# Patient Record
Sex: Female | Born: 1960
Health system: Southern US, Community
[De-identification: ages and names within clinical notes are randomized; demographics above are authoritative.]

## PROBLEM LIST (undated history)

## (undated) ENCOUNTER — Emergency Department (HOSPITAL_COMMUNITY): Disposition: A | Discharge status: Home / Self Care | Payer: Self-pay

## (undated) DIAGNOSIS — L853 Xerosis cutis: Secondary | ICD-10-CM

## (undated) DIAGNOSIS — J438 Other emphysema: Secondary | ICD-10-CM

## (undated) DIAGNOSIS — L02419 Cutaneous abscess of limb, unspecified: Secondary | ICD-10-CM

## (undated) DIAGNOSIS — F411 Generalized anxiety disorder: Secondary | ICD-10-CM

## (undated) DIAGNOSIS — R002 Palpitations: Secondary | ICD-10-CM

## (undated) DIAGNOSIS — R87612 Low grade squamous intraepithelial lesion on cytologic smear of cervix (LGSIL): Secondary | ICD-10-CM

## (undated) DIAGNOSIS — R05 Cough: Secondary | ICD-10-CM

## (undated) DIAGNOSIS — I10 Essential (primary) hypertension: Secondary | ICD-10-CM

## (undated) DIAGNOSIS — F17200 Nicotine dependence, unspecified, uncomplicated: Secondary | ICD-10-CM

## (undated) DIAGNOSIS — E7800 Pure hypercholesterolemia, unspecified: Secondary | ICD-10-CM

## (undated) DIAGNOSIS — L02415 Cutaneous abscess of right lower limb: Secondary | ICD-10-CM

## (undated) DIAGNOSIS — R21 Rash and other nonspecific skin eruption: Secondary | ICD-10-CM

## (undated) DIAGNOSIS — C801 Malignant (primary) neoplasm, unspecified: Secondary | ICD-10-CM

## (undated) HISTORY — PX: SKIN FULL THICKNESS GRAFT: SHX442

## (undated) HISTORY — DX: Pure hypercholesterolemia, unspecified: E78.00

## (undated) HISTORY — DX: Nicotine dependence, unspecified, uncomplicated: F17.200

## (undated) HISTORY — DX: Generalized anxiety disorder: F41.1

## (undated) HISTORY — DX: Low grade squamous intraepithelial lesion on cytologic smear of cervix (LGSIL): R87.612

## (undated) HISTORY — PX: NASAL FLAP ROTATION: SHX5366

## (undated) HISTORY — DX: Cough: R05

## (undated) HISTORY — PX: ABDOMINAL HYSTERECTOMY: SHX81

## (undated) HISTORY — DX: Xerosis cutis: L85.3

## (undated) HISTORY — DX: Other emphysema: J43.8

## (undated) HISTORY — DX: Rash and other nonspecific skin eruption: R21

## (undated) HISTORY — PX: TONSILLECTOMY: SUR1361

## (undated) HISTORY — PX: RHINOPLASTY: SHX2354

## (undated) HISTORY — DX: Essential (primary) hypertension: I10

## (undated) HISTORY — PX: NASAL RECONSTRUCTION: SHX2069

## (undated) HISTORY — DX: Cutaneous abscess of right lower limb: L02.415

## (undated) HISTORY — DX: Cutaneous abscess of limb, unspecified: L02.419

---

## 1999-01-04 ENCOUNTER — Encounter: Payer: Self-pay | Admitting: Family Medicine

## 1999-01-04 ENCOUNTER — Ambulatory Visit (HOSPITAL_COMMUNITY): Admission: RE | Admit: 1999-01-04 | Discharge: 1999-01-04 | Payer: Self-pay | Admitting: Family Medicine

## 2000-11-01 ENCOUNTER — Other Ambulatory Visit: Admission: RE | Admit: 2000-11-01 | Discharge: 2000-11-01 | Payer: Self-pay | Admitting: Obstetrics and Gynecology

## 2000-11-05 ENCOUNTER — Encounter: Payer: Self-pay | Admitting: Obstetrics and Gynecology

## 2000-11-05 ENCOUNTER — Ambulatory Visit (HOSPITAL_COMMUNITY): Admission: RE | Admit: 2000-11-05 | Discharge: 2000-11-05 | Payer: Self-pay | Admitting: Obstetrics and Gynecology

## 2000-11-28 ENCOUNTER — Inpatient Hospital Stay (HOSPITAL_COMMUNITY): Admission: RE | Admit: 2000-11-28 | Discharge: 2000-12-01 | Payer: Self-pay | Admitting: Obstetrics and Gynecology

## 2000-11-28 ENCOUNTER — Encounter (INDEPENDENT_AMBULATORY_CARE_PROVIDER_SITE_OTHER): Payer: Self-pay | Admitting: Specialist

## 2002-02-05 HISTORY — PX: ABDOMINAL HYSTERECTOMY: SHX81

## 2008-01-24 ENCOUNTER — Emergency Department (HOSPITAL_COMMUNITY): Admission: EM | Admit: 2008-01-24 | Discharge: 2008-01-24 | Payer: Self-pay | Admitting: Emergency Medicine

## 2008-03-04 ENCOUNTER — Encounter: Admission: RE | Admit: 2008-03-04 | Discharge: 2008-03-04 | Payer: Self-pay | Admitting: Internal Medicine

## 2008-03-30 ENCOUNTER — Encounter: Admission: RE | Admit: 2008-03-30 | Discharge: 2008-03-30 | Payer: Self-pay | Admitting: Internal Medicine

## 2008-03-30 ENCOUNTER — Other Ambulatory Visit: Admission: RE | Admit: 2008-03-30 | Discharge: 2008-03-30 | Payer: Self-pay | Admitting: Interventional Radiology

## 2008-03-30 ENCOUNTER — Encounter (INDEPENDENT_AMBULATORY_CARE_PROVIDER_SITE_OTHER): Payer: Self-pay | Admitting: Interventional Radiology

## 2010-06-23 NOTE — H&P (Signed)
[facility]  Patient:    Melanie Harris, Melanie Harris Visit Number: [id number] MRN: [id number]          Service Type: GYN Location: 1S 0006 01 Attending Physician:  Malon Kindle Dictated by:   Malachi Pro. Ambrose Mantle, M.D. Admit Date:  11/28/2000                           History and Physical  HISTORY OF PRESENT ILLNESS:  This is a 50 year old white female, para 1-0-0-1, who was admitted to the hospital for abdominal hysterectomy and possible bilateral salpingo-oophorectomy because of severe menorrhagia, severe dysmenorrhea, and significant anemia that has been partially corrected by iron.  Last menstrual period November 27, 2000.  The patients periods occur apparently at two to four-week intervals.  The previous period was November 14, 2000, and the period before that was September 23, 2000.  The periods last for seven days, are extremely heavy for three to four days, and accompanied by severe dysmenorrhea.  When I saw her on November 01, 2000, the patient stated that for the past year, periods have been heavy heavy.  She would change a tampon and pad every 30 minutes and would bleed heavily for three to four days.  It was hard to get out of bed on her heavy beds and hard to concentrate at work.  At that time, her hemoglobin was 8.1 and hematocrit 25.9.  She was placed on iron and an ultrasound was done which showed what was considered to be a normal sized uterus, but with submucosal fibroids.  The patient was offered D&C and hysteroscopy or hysterectomy.  She chose to proceed with hysterectomy.  The patient states that she would use 18 tampons per day and six pads per day.  The pain was so severe that it would make her lie down. She reports a low sex drive, wanting to have sex probably no more than once every three months.  ALLERGIES:  No known allergies.  MEDICATIONS:  She is on iron and Ziac.  PAST SURGICAL HISTORY:  Tonsillectomy.  PAST MEDICAL HISTORY:   High blood pressure.  No other major illnesses.  SOCIAL HISTORY:  She does not drink or smoke.  REVIEW OF SYSTEMS:  Noncontributory, except as in the present illness.  FAMILY HISTORY:  Mother 68 with high blood pressure.  Father 35, living and well.  One sister is normal with no health problems.  No brothers.  PHYSICAL EXAMINATION:  The head, eyes, ears, nose, and throat are normal.  VITAL SIGNS:  Blood pressure 144/70, pulse 80.  WEIGHT:  111-1/2 pounds.  BREASTS:  Soft without masses.  LUNGS:  Clear to P&A.  HEART:  Normal size and sounds.  No murmurs.  NECK:  Supple without thyromegaly.  ABDOMEN:  Soft and nontender.  No masses palpable.  Liver, spleen, and kidneys not felt.  PELVIC:  The Pap smear on September 27,2 002, was within normal limits.  The vulva and vagina are clean.  The cervix is clean.  There is menstrual blood in the vagina.  The uterus is posterior and thought to be two to two-and-a-half times normal size.  The adnexa are free of masses.  ADMITTING IMPRESSION: 1. Leiomyomata uteri. 2. Severe menorrhagia and dysmenorrhea. 2. Severe anemia.  PLAN:  The patient was admitted for abdominal hysterectomy and possible bilateral salpingo-oophorectomy.  She has been counseled about the risks of surgery, including, but not limited to heart attack, stroke, pulmonary  embolus, wound disruption, hemorrhage with need for reoperation and/or transfusion, fistula formation, nerve injury, and intestinal obstruction.  She understands and agrees to proceed. Dictated by:   Malachi Pro. Ambrose Mantle, M.D. Attending Physician:  Malon Kindle DD:  11/28/00 TD:  11/29/00 Job: [id number]

## 2010-06-23 NOTE — Op Note (Signed)
[facility]  Patient:    Melanie Harris, Melanie Harris Visit Number: [id number] MRN: [id number]          Service Type: GYN Location: 4W 0454 01 Attending Physician:  Malon Kindle Proc. Date: 11/28/00 Admit Date:  11/28/2000                             Operative Report  PREOPERATIVE DIAGNOSES:  Severe menorrhagia, severe dysmenorrhea, history of anemia to 7 grams, and ultrasound showing submucous fibroids.  POSTOPERATIVE DIAGNOSES:  Severe menorrhagia, severe dysmenorrhea, history of anemia to 7 grams, and ultrasound showing submucous fibroids.  OPERATION:  Abdominal hysterectomy.  OPERATOR:  Malachi Pro. Ambrose Mantle, M.D.  ASSISTANT:  Zenaida Niece, M.D.  ANESTHESIA:  General.  DESCRIPTION OF PROCEDURE:  The patient was brought to the operating room and placed under satisfactory general anesthesia.  She was placed in frogleg position.  The abdomen, vulva, vagina, and urethra were prepped with Betadine solution and draped as a sterile field after a Foley catheter was inserted to straight drain.  Protective towels were used to keep the Betadine from running under her body.  The abdomen was then draped as a sterile field, and a transverse incision was made and carried in layers through the skin, subcutaneous tissue, and fascia.  The fascia was separated from the rectus muscles superiorly and inferiorly.  The rectus muscles split in the midline, and the peritoneum was opened vertically.  Exploration of the upper abdomen revealed the liver to be smooth.  The gallbladder was small with no stones. Both kidneys felt normal.  Exploration of the pelvis revealed the uterus to be upper limit of normal size.  I had thought that it was at least two times normal size on exam, but the uterus was actually only upper limit of normal size.  It did not have any palpable fibroids.  Both tubes and ovaries appeared normal.  The cul-de-sacs were normal.  Packs and  retractors were used for exposure.  The uterus was elevated into the operative field.  The round ligaments bilaterally were cauterized and divided, and a bladder flap was developed.  The uteroovarian ligament and mesosalpinx were clamped and cut, and a double ligature was placed across each upper pedicle.  The uterine vessels were skeletonized, clamped, cut, and suture ligated.  Additional clamps were taken along the parametrial and paracervical tissues.  Therapeutic uterosacral ligaments were clamped, cut, suture ligated, and held, and the right vaginal angle was entered.  The uterus was then removed by transecting the upper vagina.  Vaginal angle sutures were placed, and then the central portion of the vagina was closed with interrupted figure-of-eight sutures of 0 Vicryl.  Hemostasis was achieved with a couple of additional sutures.  The uterosacral ligaments were sutured together in the midline with two sutures of 0 Vicryl.  The bladder was inflated with a significant amount of methylene blue-stained fluid, and there were no sutures close to the bladder, and there was no leakage.  Reperitonealization was done across the vaginal cuff with a running suture of 0 Vicryl.  The left round ligament stump was tied since it seemed to have a little bit of bleeding.  The right round ligament was not bleeding and was left alone.  Both tubes and ovaries appeared normal.  The appendix was identified and was completely retrocecal but was normal.  The abdominal wall was then closed after liberal irrigation confirmed hemostasis. All packs and  retractors were removed.  The abdominal wall was closed with a running suture of 0 Vicryl on the peritoneum, interrupted 0 Vicryl on the rectus fascia, two running sutures of 0 Vicryl on the fascia, a running 3-0 Vicryl on the subcu tissue, and staples on the skin.  The patient seemed to tolerate the procedure well.  Blood loss was about 100 cc.  Sponge and  needle counts were correct, and the patient was returned to recovery in satisfactory condition. Attending Physician:  Malon Kindle DD:  11/28/00 TD:  11/29/00 Job: [id number]

## 2010-06-23 NOTE — Discharge Summary (Signed)
[facility]  Patient:    Melanie Harris, Melanie Harris Visit Number: [id number] MRN: [id number]          Service Type: GYN Location: [phone] Attending Physician:  Malon Kindle Admit Date:  11/28/2000 Discharge Date: 12/01/2000                             Discharge Summary  This is the second dictation on this patient.  HISTORY:  This is a 50 year old white female with severe menorrhagia, severe dysmenorrhea, history of anemia to 7 grams, and ultrasound showing a submucous fibroid admitted for hysterectomy.  The patient underwent hysterectomy by Dr. Ambrose Mantle with Dr. Jackelyn Knife assisting under general anesthesia with blood loss of about 100 cc.  HOSPITAL COURSE:  Postoperatively, the patient did well.  She did have complaints of nausea and dizziness on the first postop day.  Temperature rose to a maximum of 100.5 on the first postop day.  She was advised to increase pulmonary toilet by coughing, deep breathing and on the third postop day, her temperature maximum had been 100.1.  She had passed flatus, had a bowel movement.  Her abdomen was soft and nontender.  Staples were removed, strips applied, and she was considered ready for discharge.  Labs showed initial white count of 5700.  Hemoglobin 10.9, hematocrit 33.4, platelet count 314,000.  Follow-up hematocrit was 30.1 and 31.0, 62 segs, 27 lymphs, 9 monos, 2 eosinophils.  Comprehensive metabolic profile was within normal limits.  Urinalysis was negative.  Path report showed uterus with chronic cervicitis and squamous metaplasia, secretory phase endometrium with degenerative changes consistent with breakthrough bleeding.  No malignancy identified.  Benign adenomyoma.  On the cut surface of the uterus, there was a 2 cm polypoid submucosal leiomyoma that on sectioning was an adenomyoma which is a leiomyoma with endometrial glands.  FINAL DIAGNOSES: 1. Severe menorrhagia and dysmenorrhea. 2. Severe  anemia. 3. Adenomyoma.  OPERATION:  Abdominal hysterectomy.  FINAL CONDITION:  Improved.  INSTRUCTIONS:  Regular diet, no vaginal entrance, no heavy lifting or strenuous activity.  Call with any temperature elevation greater than 100.4 degrees.  Call with any unusual problems.  Return to the office in 10-14 days for follow-up examination. Attending Physician:  Malon Kindle DD:  12/18/00 TD:  12/18/00 Job: [phone] [id number]

## 2010-10-24 ENCOUNTER — Emergency Department (HOSPITAL_COMMUNITY)
Admission: EM | Admit: 2010-10-24 | Discharge: 2010-10-24 | Disposition: A | Discharge status: Home / Self Care | Payer: Self-pay | Attending: Emergency Medicine | Admitting: Emergency Medicine

## 2010-10-24 DIAGNOSIS — L02419 Cutaneous abscess of limb, unspecified: Secondary | ICD-10-CM | POA: Insufficient documentation

## 2010-10-26 ENCOUNTER — Emergency Department (HOSPITAL_COMMUNITY)
Admission: EM | Admit: 2010-10-26 | Discharge: 2010-10-26 | Disposition: A | Discharge status: Home / Self Care | Payer: Self-pay | Attending: Emergency Medicine | Admitting: Emergency Medicine

## 2010-10-26 DIAGNOSIS — L03119 Cellulitis of unspecified part of limb: Secondary | ICD-10-CM | POA: Insufficient documentation

## 2010-10-26 DIAGNOSIS — L02419 Cutaneous abscess of limb, unspecified: Secondary | ICD-10-CM | POA: Insufficient documentation

## 2010-10-26 DIAGNOSIS — Z09 Encounter for follow-up examination after completed treatment for conditions other than malignant neoplasm: Secondary | ICD-10-CM | POA: Insufficient documentation

## 2011-03-11 ENCOUNTER — Encounter (HOSPITAL_COMMUNITY): Payer: Self-pay | Admitting: *Deleted

## 2011-03-11 ENCOUNTER — Emergency Department (HOSPITAL_COMMUNITY)
Admission: EM | Admit: 2011-03-11 | Discharge: 2011-03-11 | Disposition: A | Discharge status: Home / Self Care | Payer: Self-pay | Attending: Emergency Medicine | Admitting: Emergency Medicine

## 2011-03-11 DIAGNOSIS — IMO0002 Reserved for concepts with insufficient information to code with codable children: Secondary | ICD-10-CM

## 2011-03-11 DIAGNOSIS — F17200 Nicotine dependence, unspecified, uncomplicated: Secondary | ICD-10-CM | POA: Insufficient documentation

## 2011-03-11 DIAGNOSIS — L02419 Cutaneous abscess of limb, unspecified: Secondary | ICD-10-CM | POA: Insufficient documentation

## 2011-03-11 MED ORDER — DOXYCYCLINE HYCLATE 100 MG PO CAPS: 100.0000 mg | ORAL_CAPSULE | Freq: Two times a day (BID) | ORAL | Status: AC

## 2011-03-11 MED ORDER — HYDROCODONE-ACETAMINOPHEN 5-325 MG PO TABS: 1.0000 | ORAL_TABLET | ORAL | Status: AC | PRN

## 2011-03-11 NOTE — ED Provider Notes (Signed)
History     CSN: [id number]  Arrival date & time 03/11/11  1041   First MD Initiated Contact with Patient 03/11/11 1113      Chief Complaint  Patient presents with  . Abscess    (Consider location/radiation/quality/duration/timing/severity/associated sxs/prior treatment) Patient is a 51 y.o. female presenting with abscess. The history is provided by the patient.  Abscess  This is a recurrent problem. The current episode started less than one week ago. The onset was gradual. The problem has been gradually worsening. Affected Location: right posterior thigh. The problem is severe. The abscess is characterized by painfulness, draining and redness. The abscess first occurred at home. Pertinent negatives include no fever. She has received no recent medical care.    History reviewed. No pertinent past medical history.  Past Surgical History  Procedure Date  . Abdominal hysterectomy   . Tonsillectomy     No family history on file.  History  Substance Use Topics  . Smoking status: Current Everyday Smoker -- 0.5 packs/day  . Smokeless tobacco: Not on file  . Alcohol Use: 0.6 oz/week    1 Glasses of wine per week     Review of Systems  Constitutional: Negative for fever.  Skin: Positive for wound.  Neurological: Negative for numbness.  All other systems reviewed and are negative.     Allergies  Review of patient's allergies indicates no known allergies.  Home Medications  No current outpatient prescriptions on file.  BP 143/85  Pulse 114  Temp(Src) 98.6 F (37 C) (Oral)  Resp 16  Wt 105 lb (47.628 kg)  SpO2 99%  Physical Exam  Nursing note and vitals reviewed. Constitutional: She is oriented to person, place, and time. She appears well-developed and well-nourished. No distress.  HENT:  Head: Normocephalic and atraumatic.  Right Ear: External ear normal.  Left Ear: External ear normal.  Eyes: Pupils are equal, round, and reactive to light.  Neck: Normal range  of motion. Neck supple.  Cardiovascular: Normal rate, regular rhythm and intact distal pulses.   Pulmonary/Chest: Effort normal. No respiratory distress.  Abdominal: Soft. She exhibits no distension. There is no tenderness.  Musculoskeletal: Normal range of motion. She exhibits no edema and no tenderness.  Neurological: She is alert and oriented to person, place, and time. No cranial nerve deficit.  Skin: Skin is warm and dry.     Psychiatric: She has a normal mood and affect.    ED Course  Procedures (including critical care time)  Labs Reviewed - No data to display No results found.  INCISION AND DRAINAGE Performed by: Lorenz Coaster Consent: Verbal consent obtained. Risks and benefits: risks, benefits and alternatives were discussed Type: abscess  Body area: right posterior thigh  Anesthesia: local infiltration  Local anesthetic: lidocaine 1% with epinephrine  Anesthetic total: 1.5 ml  Complexity: complex Blunt dissection to break up loculations  Drainage: purulent  Drainage amount: large amt purulent drainage from distal area of induration, no drainage from proximal area  Packing material: 1/4 in iodoform gauze  Patient tolerance: Patient tolerated the procedure well with no immediate complications.   Dx 1: Abscess right thigh    MDM  Abscess drained. Small amt surrounding cellulitis.  Will d.c home with abx, pain medication. 48 f/u.        Shaaron Adler, PA-C 03/11/11 1329

## 2011-03-11 NOTE — ED Notes (Signed)
Thursday noted lesion posterior right thigh. Opened with small amount of purulent matter drained. I note two apparent lesions right posterior leg, One dark in color the other reddish. Very painful to palpation.

## 2011-03-12 NOTE — ED Provider Notes (Signed)
Medical screening examination/treatment/procedure(s) were performed by non-physician practitioner and as supervising physician I was immediately available for consultation/collaboration.  Gerhard Munch, MD 03/12/11 [phone]

## 2011-07-22 ENCOUNTER — Encounter (HOSPITAL_COMMUNITY): Payer: Self-pay

## 2011-07-22 ENCOUNTER — Emergency Department (INDEPENDENT_AMBULATORY_CARE_PROVIDER_SITE_OTHER)
Admission: EM | Admit: 2011-07-22 | Discharge: 2011-07-22 | Disposition: A | Discharge status: Home / Self Care | Payer: BC Managed Care – PPO | Source: Home / Self Care | Attending: Family Medicine | Admitting: Family Medicine

## 2011-07-22 DIAGNOSIS — L988 Other specified disorders of the skin and subcutaneous tissue: Secondary | ICD-10-CM

## 2011-07-22 DIAGNOSIS — D1720 Benign lipomatous neoplasm of skin and subcutaneous tissue of unspecified limb: Secondary | ICD-10-CM

## 2011-07-22 DIAGNOSIS — L089 Local infection of the skin and subcutaneous tissue, unspecified: Secondary | ICD-10-CM

## 2011-07-22 DIAGNOSIS — D1739 Benign lipomatous neoplasm of skin and subcutaneous tissue of other sites: Secondary | ICD-10-CM

## 2011-07-22 MED ORDER — HYDROCODONE-ACETAMINOPHEN 5-500 MG PO TABS: 1.0000 | ORAL_TABLET | Freq: Four times a day (QID) | ORAL | Status: AC | PRN

## 2011-07-22 MED ORDER — DOXYCYCLINE HYCLATE 100 MG PO CAPS: 100.0000 mg | ORAL_CAPSULE | Freq: Two times a day (BID) | ORAL | Status: AC

## 2011-07-22 NOTE — Discharge Instructions (Signed)
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 Until the fistula is surgically removed he can present with recurrent infection and drainage. Call [facility] surgery number above to make an appointment. In the meantime: Keep area clean and dry. Can use soap and water for cleaning but dry well before covering. Take the prescribed medications as instructed. Be aware that Vicodin can make you drowsy and he should not take before driving. Return to medical attention if worsening redness, swelling, tenderness or drainage despite following treatment.

## 2011-07-22 NOTE — ED Notes (Signed)
Pt has abscess on back of rt leg, had another lesion in same area that was drained and packed at [facility].

## 2011-07-23 NOTE — ED Provider Notes (Signed)
History     CSN: [id number]  Arrival date & time 07/22/11  1547   First MD Initiated Contact with Patient 07/22/11 1553      Chief Complaint  Patient presents with  . Abscess    (Consider location/radiation/quality/duration/timing/severity/associated sxs/prior treatment) HPI Comments: 51 y/o smoker, non diabetic female here c/o drainage swelling and tenderness in the back of her right thigh. States she has had a skin not for years that did not bothered her. Same area got infected 4 months ago and developed an abscess that was I&Dd at [facility]. After I&D patient was treated with doxycycline for 10 days, states that her pain and tenderness got better but she has had persistent intermitent drainage for months. Area becoming red and tender again. Denies fever or chills. No taking any medications for pain.   History reviewed. No pertinent past medical history.  Past Surgical History  Procedure Date  . Abdominal hysterectomy   . Tonsillectomy     History reviewed. No pertinent family history.  History  Substance Use Topics  . Smoking status: Current Everyday Smoker -- 0.5 packs/day  . Smokeless tobacco: Not on file  . Alcohol Use: 0.6 oz/week    1 Glasses of wine per week    OB History    Grav Para Term Preterm Abortions TAB SAB Ect Mult Living                  Review of Systems  Constitutional: Negative for fever and chills.  Skin:       As per HPI.  All other systems reviewed and are negative.    Allergies  Review of patient's allergies indicates no known allergies.  Home Medications   Current Outpatient Rx  Name Route Sig Dispense Refill  . DIPHENHYDRAMINE-APAP (SLEEP) 25-500 MG PO TABS Oral Take 1 tablet by mouth at bedtime as needed.    Marland Kitchen DOXYCYCLINE HYCLATE 100 MG PO CAPS Oral Take 1 capsule (100 mg total) by mouth 2 (two) times daily. 20 capsule 0  . HYDROCODONE-ACETAMINOPHEN 5-500 MG PO TABS Oral Take 1 tablet by mouth every 6 (six) hours as  needed for pain. 15 tablet 0  . IBUPROFEN 200 MG PO TABS Oral Take 600 mg by mouth every 6 (six) hours as needed.      BP 175/88  Pulse 86  Temp 98.2 F (36.8 C) (Oral)  Resp 18  SpO2 99%  Physical Exam  Nursing note and vitals reviewed. Constitutional: She is oriented to person, place, and time. She appears well-developed and well-nourished. No distress.  HENT:  Head: Normocephalic and atraumatic.  Cardiovascular: Normal heart sounds.   Pulmonary/Chest: Breath sounds normal.  Neurological: She is alert and oriented to person, place, and time.  Skin:       Posterior left thigh: located at center mid posterior thigh there is an area of non tender soft mass with characteristic consistent with skin lipoma. Just inferior and adjacent to this mass there is an area were skin appears violaceous in color with a central opening of about 5 mm and scant discharge. There is mild surrounding erythema with no induration and patient reports tenderness to palpation. No fluctuations. No striking erythema.    ED Course  Debridement Performed by: Sharin Grave Authorized by: Sharin Grave Consent: Verbal consent obtained. Risks and benefits: risks, benefits and alternatives were discussed Consent given by: patient Patient understanding: patient states understanding of the procedure being performed Preparation: Patient was prepped and draped in the  usual sterile fashion. Local anesthesia used: yes Local anesthetic: lidocaine 1% with epinephrine Anesthetic total: 2 ml Patient tolerance: Patient tolerated the procedure well with no immediate complications. Comments: After local anesthesia area was cleaned and irrigated. Some devitalized skin removed. Was able to intrduce the tip of forcept all the way in and impress underlying fistula of more than 3 cm with gray walls. No significant drainage. Stimulated walls of fistula until red blood return. Minimal bleeding. Area was covered with  antibiotic ointment and dry dressing.    (including critical care time)  Labs Reviewed - No data to display No results found.   1. Skin fistula   2. Infection, skin   3. Lipoma of thigh       MDM  My impression is that Mrs. Coghlan has had a skin lipoma for years. 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 I think this patient will benefit for surgical removal of affected skin, including lipoma and fistula for definitive treatement once infection is cleared. Prescribed doxycycline, wound care instructions discussed. Referral for [facility] surgery provided. Asked to have her wound rechecked earlier if worsening pain redness or drainage.         Sharin Grave, MD 07/23/11 1216

## 2011-07-25 ENCOUNTER — Ambulatory Visit (INDEPENDENT_AMBULATORY_CARE_PROVIDER_SITE_OTHER): Payer: BC Managed Care – PPO | Admitting: General Surgery

## 2011-07-25 ENCOUNTER — Encounter (INDEPENDENT_AMBULATORY_CARE_PROVIDER_SITE_OTHER): Payer: Self-pay | Admitting: General Surgery

## 2011-07-25 VITALS — BP 138/68 | HR 80 | Temp 98.0°F | Resp 14 | Ht 67.0 in | Wt 102.0 lb

## 2011-07-25 DIAGNOSIS — L02415 Cutaneous abscess of right lower limb: Secondary | ICD-10-CM | POA: Insufficient documentation

## 2011-07-25 DIAGNOSIS — L03119 Cellulitis of unspecified part of limb: Secondary | ICD-10-CM

## 2011-07-25 HISTORY — DX: Cutaneous abscess of right lower limb: L02.415

## 2011-07-25 NOTE — Progress Notes (Signed)
Subjective:     Patient ID: Melanie Harris, female   DOB: 1960/03/30, 51 y.o.   MRN: [id number]  HPI We're asked to see the patient in consultation by Dr. Hattie Perch to evaluate her for a cyst on her right posterior thigh. The patient is a 51 year old white female who states that she has had a cyst on her legs since October. She states it has been lanced a couple of times by urgent care doctors. This was last done about 3 days ago. She still has some pain in the area. She denies any drainage.  Review of Systems  Constitutional: Negative.   HENT: Negative.   Eyes: Negative.   Respiratory: Negative.   Cardiovascular: Negative.   Gastrointestinal: Negative.   Genitourinary: Negative.   Musculoskeletal: Negative.   Skin: Negative.   Neurological: Negative.   Hematological: Negative.   Psychiatric/Behavioral: Negative.        Objective:   Physical Exam  Constitutional: She is oriented to person, place, and time. She appears well-developed and well-nourished.  HENT:  Head: Normocephalic and atraumatic.  Eyes: Conjunctivae and EOM are normal. Pupils are equal, round, and reactive to light.  Neck: Normal range of motion. Neck supple.  Cardiovascular: Normal rate, regular rhythm and normal heart sounds.   Pulmonary/Chest: Effort normal and breath sounds normal.  Abdominal: Soft. Bowel sounds are normal.  Musculoskeletal: Normal range of motion.       The patient has a small open wound on the right posterior thigh. I cannot express any purulence from the area today. There is minimal redness or induration.  Neurological: She is alert and oriented to person, place, and time.  Skin: Skin is warm and dry.  Psychiatric: She has a normal mood and affect. Her behavior is normal.       Assessment:     The patient has what appears to be a sebaceous cyst on the right posterior thigh. This area has recurrently got infected. Today the area looks fairly clean. It would be my recommendation to  try to prevent more recurrent infections of the area to try to let it heal for several weeks and then we can plan to take her to the operating room and excise this area completely back to healthy tissue and hopefully that will help it heal properly. She is in agreement with this treatment plan to    Plan:     We will plan to see her back in about 3 weeks for a wound check

## 2011-07-25 NOTE — Patient Instructions (Signed)
Shower daily and keep area covered with clean dry gauze

## 2011-08-21 ENCOUNTER — Encounter (HOSPITAL_COMMUNITY): Payer: Self-pay

## 2011-08-21 ENCOUNTER — Emergency Department (INDEPENDENT_AMBULATORY_CARE_PROVIDER_SITE_OTHER)
Admission: EM | Admit: 2011-08-21 | Discharge: 2011-08-21 | Disposition: A | Discharge status: Home / Self Care | Payer: BC Managed Care – PPO | Source: Home / Self Care | Attending: Emergency Medicine | Admitting: Emergency Medicine

## 2011-08-21 ENCOUNTER — Ambulatory Visit (INDEPENDENT_AMBULATORY_CARE_PROVIDER_SITE_OTHER): Payer: BC Managed Care – PPO | Admitting: General Surgery

## 2011-08-21 ENCOUNTER — Encounter (INDEPENDENT_AMBULATORY_CARE_PROVIDER_SITE_OTHER): Payer: Self-pay | Admitting: General Surgery

## 2011-08-21 VITALS — BP 130/72 | HR 68 | Temp 96.9°F | Resp 14 | Ht 67.0 in | Wt 101.0 lb

## 2011-08-21 DIAGNOSIS — R21 Rash and other nonspecific skin eruption: Secondary | ICD-10-CM

## 2011-08-21 DIAGNOSIS — L02419 Cutaneous abscess of limb, unspecified: Secondary | ICD-10-CM | POA: Insufficient documentation

## 2011-08-21 DIAGNOSIS — B372 Candidiasis of skin and nail: Secondary | ICD-10-CM

## 2011-08-21 HISTORY — DX: Rash and other nonspecific skin eruption: R21

## 2011-08-21 MED ORDER — CLOTRIMAZOLE 1 % EX CREA: TOPICAL_CREAM | CUTANEOUS | Status: DC

## 2011-08-21 NOTE — Patient Instructions (Signed)
Need to see dermatologist. Once skin is healed then we will plan to excise the area on thigh

## 2011-08-21 NOTE — ED Provider Notes (Signed)
History     CSN: [id number]  Arrival date & time 08/21/11  1127   First MD Initiated Contact with Patient 08/21/11 1129      Chief Complaint  Patient presents with  . Rash    (Consider location/radiation/quality/duration/timing/severity/associated sxs/prior treatment) Patient is a 51 y.o. female presenting with rash. The history is provided by the patient.  Rash   This patient complains of a pruritic painful, burning rash.  Location: bilateral axilla, right antecubital space, right posterior thigh  Onset: 2 wk ago   Course: worsen Self-treated with: peroxide, cortaid             Improvement with treatment: minimal  History Itching: yes  Tenderness: no  New medications/antibiotics: no  Pet exposure: no  Recent travel or tropical exposure: no  New soaps, shampoos, detergent, clothing: no Tick/insect exposure: no   Red Flags Feeling ill: no Fever:no Facial/tongue swelling/difficulty breathing:  no  Diabetic or immunocompromised: no  Patient with history of complex skin infection, treated with antibiotics, to right posterior thigh that has since improved but subsequently developed red itchy rash to area, states bilateral axilla began to exhibit same and has now gotten progressively worse, has refrained from topical agents(deodorant), has appointment with dermatology in two weeks.  Past Medical History  Diagnosis Date  . Abscess of thigh     right    Past Surgical History  Procedure Date  . Abdominal hysterectomy 2004  . Tonsillectomy 10-12 yrs old    Family History  Problem Relation Age of Onset  . Lung cancer Father   . Heart failure Mother     History  Substance Use Topics  . Smoking status: Current Everyday Smoker -- 0.5 packs/day  . Smokeless tobacco: Not on file  . Alcohol Use: 0.6 oz/week    1 Glasses of wine per week    OB History    Grav Para Term Preterm Abortions TAB SAB Ect Mult Living                  Review of Systems  Skin:  Positive for rash.  All other systems reviewed and are negative.    Allergies  Review of patient's allergies indicates no known allergies.  Home Medications   Current Outpatient Rx  Name Route Sig Dispense Refill  . CLOTRIMAZOLE 1 % EX CREA  Apply to affected area 2 times daily 45 g 1  . IBUPROFEN 200 MG PO TABS Oral Take 600 mg by mouth every 6 (six) hours as needed.      BP 160/90  Pulse 97  Temp 98.2 F (36.8 C) (Oral)  Resp 16  SpO2 97%  Physical Exam  Nursing note and vitals reviewed. Constitutional: She is oriented to person, place, and time. Vital signs are normal. She appears well-developed and well-nourished. She is active and cooperative.  HENT:  Head: Normocephalic.  Mouth/Throat: Uvula is midline, oropharynx is clear and moist and mucous membranes are normal.  Eyes: Conjunctivae are normal. Pupils are equal, round, and reactive to light. No scleral icterus.  Neck: Trachea normal. Neck supple.  Cardiovascular: Normal rate and regular rhythm.   Pulmonary/Chest: Effort normal and breath sounds normal.  Lymphadenopathy:    She has no cervical adenopathy.    She has no axillary adenopathy.  Neurological: She is alert and oriented to person, place, and time. No cranial nerve deficit or sensory deficit.  Skin: Skin is warm and dry. Rash noted. There is erythema.  Bilateral axilla, peeling at edges   Psychiatric: She has a normal mood and affect. Her speech is normal and behavior is normal. Judgment and thought content normal. Cognition and memory are normal.    ED Course  Procedures (including critical care time)  Labs Reviewed - No data to display No results found.   1. Rash   2. Intertriginous candidiasis       MDM  Cool showers; avoid heat, sunlight and anything that makes condition worse.  Take medication as prescribed.  RTC if symptoms do not improve or begin to have problems swallowing, breathing or significant change in condition.  Follow up  with dermatology as scheduled.         Johnsie Kindred, NP 08/21/11 2124

## 2011-08-21 NOTE — ED Provider Notes (Signed)
Medical screening examination/treatment/procedure(s) were performed by non-physician practitioner and as supervising physician I was immediately available for consultation/collaboration.  Leslee Home, M.D.   Reuben Likes, MD 08/21/11 [phone]

## 2011-08-21 NOTE — ED Notes (Signed)
C/o 2 week duration of rash on leg, both arms; used betadine and OTC cortisone cream w/o relief

## 2011-08-22 ENCOUNTER — Telehealth (INDEPENDENT_AMBULATORY_CARE_PROVIDER_SITE_OTHER): Payer: Self-pay | Admitting: General Surgery

## 2011-08-22 NOTE — Telephone Encounter (Signed)
Pt returned my phone call and I informed her that [facility] will not schedule her an appt until she speaks with their insurance and billing department.  She said that she would call them and then she would proceed to go ahead and make her appt.

## 2011-08-22 NOTE — Telephone Encounter (Signed)
LMOM asking pt to return my call. °

## 2011-08-28 ENCOUNTER — Emergency Department (HOSPITAL_COMMUNITY)
Admission: EM | Admit: 2011-08-28 | Discharge: 2011-08-28 | Disposition: A | Discharge status: Home / Self Care | Payer: BC Managed Care – PPO | Attending: Emergency Medicine | Admitting: Emergency Medicine

## 2011-08-28 ENCOUNTER — Encounter (HOSPITAL_COMMUNITY): Payer: Self-pay | Admitting: *Deleted

## 2011-08-28 DIAGNOSIS — F17200 Nicotine dependence, unspecified, uncomplicated: Secondary | ICD-10-CM | POA: Insufficient documentation

## 2011-08-28 DIAGNOSIS — R21 Rash and other nonspecific skin eruption: Secondary | ICD-10-CM

## 2011-08-28 MED ORDER — LORATADINE 10 MG PO TABS: 10.0000 mg | ORAL_TABLET | Freq: Every day | ORAL | Status: DC

## 2011-08-28 MED ORDER — FAMOTIDINE 20 MG PO TABS: 40.0000 mg | ORAL_TABLET | Freq: Two times a day (BID) | ORAL | Status: DC

## 2011-08-28 MED ORDER — DEXAMETHASONE SODIUM PHOSPHATE 10 MG/ML IJ SOLN
10.0000 mg | Freq: Once | INTRAMUSCULAR | Status: AC
  Administered 2011-08-28: 10 mg via INTRAMUSCULAR
  Filled 2011-08-28: qty 1

## 2011-08-28 MED ORDER — PREDNISONE 10 MG PO TABS: 20.0000 mg | ORAL_TABLET | Freq: Every day | ORAL | Status: DC

## 2011-08-28 NOTE — ED Notes (Signed)
Pt has rash to arms and legs x's 3 weeks. Reports rash itches. Skin red, dry and inflamed.

## 2011-08-28 NOTE — ED Provider Notes (Signed)
Medical screening examination/treatment/procedure(s) were conducted as a shared visit with non-physician practitioner(s) and myself.  I personally evaluated the patient during the encounter.  Macular erythematous inflammatory skin response in above location;   Does not look cellulitic;  rx steroid, claritin, pepcid;  Refer to derm  Donnetta Hutching, MD 08/28/11 1340

## 2011-08-28 NOTE — ED Provider Notes (Signed)
History     CSN: [id number]  Arrival date & time 08/28/11  1108   First MD Initiated Contact with Patient 08/28/11 1141      Chief Complaint  Patient presents with  . Rash  . Pruritis    (Consider location/radiation/quality/duration/timing/severity/associated sxs/prior treatment) HPI  51 year old female presents for evaluation of rash. She reports she had an abscess on right thigh that was requiring I&D procedure 3 weeks ago.  She was given doxycycline as treatment for 10 days, and also was recommended to use Dial soap. After taken it for 3 days she developed a rash.  Report rash initially started at the I&D site and then spread to her legs, armpits and arms.  Rash is itching and burning.  Rash has been persistent.  She denies fever, chills, cp, sob, throat swelling, headache, mouth sore, or vaginal sores.  She has returned to Urgent care and was recommended to be seen by dermatologist. Pt also given Lomitrin to use, but has not notice any improvement. However, pt sts her appointment is on August 15th and she cannot wait that long.  Denies any other medication changes, new pets, detergents, tick exposure, travel, or prolonged sun exposure.  No hx of diabetes.     Past Medical History  Diagnosis Date  . Abscess of thigh     right    Past Surgical History  Procedure Date  . Abdominal hysterectomy 2004  . Tonsillectomy 10-12 yrs old    Family History  Problem Relation Age of Onset  . Lung cancer Father   . Heart failure Mother     History  Substance Use Topics  . Smoking status: Current Everyday Smoker -- 0.5 packs/day  . Smokeless tobacco: Not on file  . Alcohol Use: 0.6 oz/week    1 Glasses of wine per week    OB History    Grav Para Term Preterm Abortions TAB SAB Ect Mult Living                  Review of Systems  All other systems reviewed and are negative.    Allergies  Review of patient's allergies indicates no known allergies.  Home Medications    Current Outpatient Rx  Name Route Sig Dispense Refill  . CLOTRIMAZOLE 1 % EX CREA  Apply to affected area 2 times daily 45 g 1  . IBUPROFEN 200 MG PO TABS Oral Take 600 mg by mouth every 6 (six) hours as needed.      BP 159/86  Pulse 94  Temp 98.9 F (37.2 C) (Oral)  Resp 18  SpO2 100%  Physical Exam  Nursing note and vitals reviewed. Constitutional: She is oriented to person, place, and time. She appears well-developed and well-nourished. No distress.  HENT:  Head: Normocephalic and atraumatic.  Right Ear: External ear normal.  Left Ear: External ear normal.  Nose: Nose normal.  Mouth/Throat: Oropharynx is clear and moist. No oropharyngeal exudate.       No oral mucosa rash  Eyes: Conjunctivae and EOM are normal. Pupils are equal, round, and reactive to light. Right eye exhibits no discharge. Left eye exhibits no discharge.  Neck: Normal range of motion. Neck supple.  Cardiovascular: Normal rate and regular rhythm.   Pulmonary/Chest: Effort normal and breath sounds normal. No respiratory distress.  Abdominal: Soft. There is no tenderness.  Lymphadenopathy:    She has no cervical adenopathy.  Neurological: She is alert and oriented to person, place, and time.  Skin:  Skin is warm.          Patchy erythematous rash noted to bilateral axillae, antecubital fossa of arms, and posterior fossa of legs bilaterally with mild evidence of scaling.  Non petechial, pustular, or vesicular.      ED Course  Procedures (including critical care time)  Labs Reviewed - No data to display No results found.   No diagnosis found.    MDM  Rash in flexor aspect of axillae, arms, and legs. Rash resemble sun burn. No life threatening rash. VSS.  Questionable allergic reaction to doxycycline.  Pt has finished her abx course.  I recommend pt to switch back to her normal soap until sxs resolved.  Plan to have pt on prednisone/claritin/pepcid and for pt to f/u with dermatology.  Discussed  care with my attending who  evaluate this rash and agrees with plan.     BP 159/86  Pulse 94  Temp 98.9 F (37.2 C) (Oral)  Resp 18  SpO2 100%  Vital signs and medical records were reviewed and considered.        Fayrene Helper, PA-C 08/28/11 1229

## 2011-09-11 NOTE — Progress Notes (Signed)
Subjective:     Patient ID: Melanie Harris, female   DOB: 1960/03/26, 51 y.o.   MRN: [id number]  HPI The patient is a 51 year old white female who we have been following for an abscess on the right posterior thigh. Since her last visit the abscess appears to resolved. She continues to have a area of induration there. The abscess has recurred several times in the same location. Because of this I think this area needs to be excised at some point. Also since her last visit she has developed a significant rash over a good portion of her skin.  Review of Systems     Objective:   Physical Exam On exam the abscess appears to resolve to him the right posterior thigh. She has some residual induration in that area. She also has a significant macular looking rash over a significant amount of her skin.    Assessment:     Recurrent abscess in the right posterior thigh. She also has a new rash    Plan:     At some point in the future she will probably need to have the area excised on the right posterior thigh. Her most pressing issue is the new rash. I recommended that she see a dermatologist immediately for this. We will reevaluate the area on her thigh once the rash has resolved.

## 2011-09-15 ENCOUNTER — Encounter (HOSPITAL_COMMUNITY): Payer: Self-pay | Admitting: Emergency Medicine

## 2011-09-15 ENCOUNTER — Emergency Department (HOSPITAL_COMMUNITY)
Admission: EM | Admit: 2011-09-15 | Discharge: 2011-09-16 | Discharge status: Against Medical Advice | Payer: BC Managed Care – PPO | Attending: Emergency Medicine | Admitting: Emergency Medicine

## 2011-09-15 DIAGNOSIS — F17200 Nicotine dependence, unspecified, uncomplicated: Secondary | ICD-10-CM | POA: Insufficient documentation

## 2011-09-15 DIAGNOSIS — R21 Rash and other nonspecific skin eruption: Secondary | ICD-10-CM | POA: Insufficient documentation

## 2011-09-15 NOTE — ED Notes (Signed)
Pt alert, arrives from home, c/o ? Wound to back of right thigh, onset several days ago, seen for rash recently in Ed, resp even unlabored, skin pwd

## 2011-09-16 NOTE — ED Notes (Signed)
Pt reports she noted a rash to right leg, dry skin and redness starting yesterday and becoming worse today. Pt reports she noted drainage on bed sheets and area on upper thigh became red and raw. Pt denies fever and states she has been using hydrogen peroxide to clean area. Pt states she did have a cyst on her right upper thigh and is following up with a dermatologist on the 15th, but is unable to wait for appointment.

## 2011-09-16 NOTE — ED Notes (Signed)
PA at bedside.

## 2011-09-16 NOTE — ED Notes (Signed)
Patient came to the nurse's station and stated that she was leaving. Stated she had been here too long.

## 2011-09-16 NOTE — ED Notes (Signed)
Dr Rubin Payor aware and to see pt, informed pt

## 2011-09-16 NOTE — ED Provider Notes (Signed)
History     CSN: [id number]  Arrival date & time 09/15/11  2218   First MD Initiated Contact with Patient 09/16/11 0129      Chief Complaint  Patient presents with  . Wound Check    (Consider location/radiation/quality/duration/timing/severity/associated sxs/prior treatment) HPI Melanie Harris is a 51 y.o. female presents to the emergency department complaining of rash.  The onset of the symptoms was  gradual starting 4 weeks ago.  The patient has no associated symptoms.  The symptoms have been  persistent, gradually worsened.  nothing makes the symptoms worse and prednisone makes symptoms better.  The patient denies fever, chills, headache, abdominal pain, nausea vomiting, diarrhea, chest pain, shortness of breath.  Patient states she has a cyst in the posterior portion of her right thigh it has been lanced 3 times in the past. His lanced approximately 5 weeks ago and the patient was put on doxycycline. After 3 days of antibiotic she developed a rash in the axilla, antecubital fossa, the popliteal space.  On 08/28/2011 the patient presented to [facility] long emergency department complaining of this rash.  She was treated with prednisone Claritin and Pepcid. This helped the rash some but did not abate.  She stopped the prednisone the rash has spread to the anterior portion of the lower extremities.  For ear erythema and derotation around the cyst site has increased. Patient has been pouring peroxide on the rash and cyst. It has continued to get worse with the skin becoming raw in the incision site oozing pus.  She was prescribed a cream, however it was very expensive and the pharmacist recommended Lamotrin which she is continued to use as well.  The rash is not improving.  She has an appointment with a dermatologist on August 15 but states she cannot wait that long.    The patient has medical history significant for:  Past Medical History  Diagnosis Date  . Abscess of thigh     right      Past Medical History  Diagnosis Date  . Abscess of thigh     right    Past Surgical History  Procedure Date  . Abdominal hysterectomy 2004  . Tonsillectomy 10-11 yrs old    Family History  Problem Relation Age of Onset  . Lung cancer Father   . Heart failure Mother     History  Substance Use Topics  . Smoking status: Current Everyday Smoker -- 0.5 packs/day  . Smokeless tobacco: Not on file  . Alcohol Use: 0.6 oz/week    1 Glasses of wine per week    OB History    Grav Para Term Preterm Abortions TAB SAB Ect Mult Living                  Review of Systems  Constitutional: Negative for fever, diaphoresis, appetite change, fatigue and unexpected weight change.  HENT: Negative for mouth sores.   Eyes: Negative for visual disturbance.  Respiratory: Negative for cough, chest tightness, shortness of breath and wheezing.   Cardiovascular: Negative for chest pain.  Gastrointestinal: Negative for nausea, vomiting, abdominal pain, diarrhea and constipation.  Genitourinary: Negative for dysuria, urgency, frequency and hematuria.  Musculoskeletal: Negative for back pain.  Skin: Positive for rash.  Neurological: Negative for syncope, light-headedness and headaches.  Hematological: Does not bruise/bleed easily.  Psychiatric/Behavioral: Negative for disturbed wake/sleep cycle. The patient is not nervous/anxious.     Allergies  Doxycycline  Home Medications   Current Outpatient Rx  Name  Route Sig Dispense Refill  . DIPHENHYDRAMINE-APAP (SLEEP) 25-500 MG PO TABS Oral Take 2 tablets by mouth at bedtime as needed. sleep    . IBUPROFEN 200 MG PO TABS Oral Take 400 mg by mouth every 6 (six) hours as needed. Pain      BP 168/89  Pulse 89  Temp 98 F (36.7 C) (Oral)  Resp 16  SpO2 99%  Physical Exam  Nursing note and vitals reviewed. Constitutional: She appears well-developed and well-nourished. No distress.  HENT:  Head: Normocephalic and atraumatic.   Mouth/Throat: Oropharynx is clear and moist. No oropharyngeal exudate.  Eyes: Conjunctivae are normal. No scleral icterus.  Neck: Normal range of motion. Neck supple.  Cardiovascular: Normal rate, regular rhythm and intact distal pulses.   Pulmonary/Chest: Effort normal and breath sounds normal. No respiratory distress. She has no wheezes.  Abdominal: Soft. Bowel sounds are normal. She exhibits no mass. There is no tenderness. There is no rebound and no guarding.  Musculoskeletal: Normal range of motion. She exhibits no edema.  Neurological: She is alert.       Speech is clear and goal oriented Moves extremities without ataxia  Skin: Skin is warm and dry. She is not diaphoretic.          1 large open and raw site - see annotation Other sites with scaling and excoriations - large erythematous, scaling patches in the axilla, antecubital fossa, popliteal fossa and shins of the lower extreme it is bilaterally worse on the right.  Psychiatric: She has a normal mood and affect.    ED Course  Procedures (including critical care time)  Labs Reviewed - No data to display No results found.   1. Rash       MDM  Melanie Harris presents with persistent rash.  Portion, right thigh is concerning for cellulitis versus extreme irritation from continued use of peroxide.  I discussed these possibilities with her and have told her that I would like to have the MD look at it, but follow-up with the dermatologist would be important.  I have discussed this patient with Dr Rubin Payor and he has agreed to see her.    Pt advised the RN at the desk that she had waited too long to be seen and left AMA.  Dr Rubin Payor was not able to see her before her choice to leave AMA.          Dahlia Client Kyandre Okray, PA-C 09/16/11 [phone]

## 2011-09-18 NOTE — ED Provider Notes (Signed)
Medical screening examination/treatment/procedure(s) were performed by non-physician practitioner and as supervising physician I was immediately available for consultation/collaboration.  Grason Brailsford R. Lumen Brinlee, MD 09/18/11 0730 

## 2011-09-27 ENCOUNTER — Encounter (INDEPENDENT_AMBULATORY_CARE_PROVIDER_SITE_OTHER): Payer: BC Managed Care – PPO | Admitting: General Surgery

## 2011-10-16 ENCOUNTER — Encounter (INDEPENDENT_AMBULATORY_CARE_PROVIDER_SITE_OTHER): Payer: Self-pay | Admitting: General Surgery

## 2011-10-16 ENCOUNTER — Ambulatory Visit (INDEPENDENT_AMBULATORY_CARE_PROVIDER_SITE_OTHER): Payer: BC Managed Care – PPO | Admitting: General Surgery

## 2011-10-16 VITALS — BP 138/86 | HR 64 | Temp 97.6°F | Resp 12 | Ht 67.0 in | Wt 102.6 lb

## 2011-10-16 DIAGNOSIS — L02419 Cutaneous abscess of limb, unspecified: Secondary | ICD-10-CM

## 2011-10-16 DIAGNOSIS — L02415 Cutaneous abscess of right lower limb: Secondary | ICD-10-CM

## 2011-10-16 NOTE — Progress Notes (Signed)
Patient ID: Melanie Harris, female   DOB: 10/09/1960, 51 y.o.   MRN: [id number]  Chief Complaint  Patient presents with  . Abscess    HPI Melanie Harris is a 51 y.o. female.  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  Since October 2012 she has had 3 distinct episodes of abscess of her right posterior thigh previous in a localized problem. She has been seen in the emergency room of 3 times for incision and drainage. She has seen Dr. Carolynne Edouard in the office and has been advised to have a elective excision of this area. She decided to return and called for an appointment. She says that she's had some recent swelling and it started draining last night.  She is a daily smoker and continues to smoke. I discussed this with her and strongly advised her to stop as it will delay and affect wound healing. She otherwise has no major medical problems. She is single, lives alone, works as a Production designer, theatre/television/film for [employer]. HPI  Past Medical History  Diagnosis Date  . Abscess of thigh     right    Past Surgical History  Procedure Date  . Abdominal hysterectomy 2004  . Tonsillectomy 10-11 yrs old    Family History  Problem Relation Age of Onset  . Lung cancer Father   . Heart failure Mother     Social History History  Substance Use Topics  . Smoking status: Current Everyday Smoker -- 0.5 packs/day  . Smokeless tobacco: Not on file  . Alcohol Use: 0.6 oz/week    1 Glasses of wine per week    Allergies  Allergen Reactions  . Doxycycline     Rash     Current Outpatient Prescriptions  Medication Sig Dispense Refill  . diphenhydramine-acetaminophen (TYLENOL PM) 25-500 MG TABS Take 2 tablets by mouth at bedtime as needed. sleep      . ibuprofen (ADVIL,MOTRIN) 200 MG tablet Take 400 mg by mouth every 6 (six) hours as needed. Pain        Review of Systems Review of Systems  Constitutional: Negative  for fever, chills and unexpected weight change.  HENT: Negative for hearing loss, congestion, sore throat, trouble swallowing and voice change.   Eyes: Negative for visual disturbance.  Respiratory: Negative for cough and wheezing.   Cardiovascular: Negative for chest pain, palpitations and leg swelling.  Gastrointestinal: Negative for nausea, vomiting, abdominal pain, diarrhea, constipation, blood in stool, abdominal distention and anal bleeding.  Genitourinary: Negative for hematuria, vaginal bleeding and difficulty urinating.  Musculoskeletal: Negative for arthralgias.  Skin: Positive for wound. Negative for rash.  Neurological: Negative for seizures, syncope and headaches.  Hematological: Negative for adenopathy. Does not bruise/bleed easily.  Psychiatric/Behavioral: Negative for confusion.    Blood pressure 138/86, pulse 64, temperature 97.6 F (36.4 C), resp. rate 12, height 5\' 7"  (1.702 m), weight 102 lb 9.6 oz (46.539 kg).  Physical Exam Physical Exam  Constitutional: She is oriented to person, place, and time. She appears well-developed and well-nourished. No distress.  HENT:  Head: Normocephalic and atraumatic.  Eyes: Conjunctivae and EOM are normal. Pupils are equal, round, and reactive to light. Left eye exhibits no discharge. No scleral icterus.  Neck: Neck supple. No JVD present. No tracheal deviation present. No thyromegaly present.  Cardiovascular: Normal rate, regular rhythm, normal heart sounds and intact distal pulses.   No murmur heard. Pulmonary/Chest: Effort normal and  breath sounds normal. No respiratory distress. She has no wheezes. She has no rales. She exhibits no tenderness.  Musculoskeletal: She exhibits no edema and no tenderness.  Lymphadenopathy:    She has no cervical adenopathy.  Neurological: She is alert and oriented to person, place, and time. She exhibits normal muscle tone. Coordination normal.  Skin: Skin is warm. No rash noted. She is not  diaphoretic. No erythema. No pallor.       1.5-2.0 cm diameter chronic superficial soft tissue infection right posterior thigh, mid thigh. 2 small open areas. This appears very localized. Muscle compartments are soft.  Psychiatric: She has a normal mood and affect. Her behavior is normal. Judgment and thought content normal.    Data Reviewed Our old records.  Assessment    Chronic abscess right posterior thigh. This will need to be excised and heal by secondary intention to give it the best chance to heal. The etiology of this is not clear.  Tobacco abuse    Plan    The patient likely had this area excised, and we will schedule that surgery previously to be done in prone position with either sedation or general anesthesia that can be done as an outpatient.  I discussed the indications, details, techniques, and numerous risk of surgery with her. She understands these issues. Her questions were answered. She agrees with this plan.  She is strongly encouraged to stop smoking. She is advised to contact her primary care physician for smoking cessation program.       Angelia Mould. Derrell Lolling, M.D., [facility] General and Minimally invasive Surgery Breast and Colorectal Surgery Office:   [phone] Pager:   [phone]  10/16/2011, 9:19 AM

## 2011-10-16 NOTE — Patient Instructions (Signed)
You have a chronic soft tissue infection, or abscess of your right posterior thigh.  You'll be scheduled for excision of this abscess under anesthesia in the near future. This wound will be left open and will slowly heal in over 3 or 4 weeks.  You are strongly encouraged to stop smoking.

## 2011-11-19 ENCOUNTER — Encounter (INDEPENDENT_AMBULATORY_CARE_PROVIDER_SITE_OTHER): Payer: BC Managed Care – PPO | Admitting: General Surgery

## 2013-04-20 ENCOUNTER — Encounter (HOSPITAL_COMMUNITY): Payer: Self-pay | Admitting: Emergency Medicine

## 2013-04-20 ENCOUNTER — Inpatient Hospital Stay (HOSPITAL_COMMUNITY)
Admission: EM | Admit: 2013-04-20 | Discharge: 2013-04-22 | DRG: 603 | Disposition: A | Discharge status: Home / Self Care | Payer: Managed Care, Other (non HMO) | Attending: Internal Medicine | Admitting: Internal Medicine

## 2013-04-20 DIAGNOSIS — L259 Unspecified contact dermatitis, unspecified cause: Secondary | ICD-10-CM | POA: Diagnosis present

## 2013-04-20 DIAGNOSIS — I10 Essential (primary) hypertension: Secondary | ICD-10-CM | POA: Diagnosis present

## 2013-04-20 DIAGNOSIS — Z8249 Family history of ischemic heart disease and other diseases of the circulatory system: Secondary | ICD-10-CM

## 2013-04-20 DIAGNOSIS — L0291 Cutaneous abscess, unspecified: Secondary | ICD-10-CM

## 2013-04-20 DIAGNOSIS — Z881 Allergy status to other antibiotic agents status: Secondary | ICD-10-CM

## 2013-04-20 DIAGNOSIS — F17200 Nicotine dependence, unspecified, uncomplicated: Secondary | ICD-10-CM | POA: Diagnosis present

## 2013-04-20 DIAGNOSIS — R21 Rash and other nonspecific skin eruption: Secondary | ICD-10-CM | POA: Diagnosis present

## 2013-04-20 DIAGNOSIS — L02419 Cutaneous abscess of limb, unspecified: Principal | ICD-10-CM | POA: Diagnosis present

## 2013-04-20 DIAGNOSIS — Z801 Family history of malignant neoplasm of trachea, bronchus and lung: Secondary | ICD-10-CM

## 2013-04-20 DIAGNOSIS — L02415 Cutaneous abscess of right lower limb: Secondary | ICD-10-CM | POA: Diagnosis present

## 2013-04-20 DIAGNOSIS — L0390 Cellulitis, unspecified: Secondary | ICD-10-CM

## 2013-04-20 DIAGNOSIS — L03119 Cellulitis of unspecified part of limb: Principal | ICD-10-CM

## 2013-04-20 DIAGNOSIS — Z23 Encounter for immunization: Secondary | ICD-10-CM

## 2013-04-20 LAB — LIPID PANEL
CHOLESTEROL: 151 mg/dL (ref 0–200)
HDL: 65 mg/dL (ref >39)
LDL Cholesterol: 71 mg/dL (ref 0–99)
Total CHOL/HDL Ratio: 2.3 RATIO
Triglycerides: 75 mg/dL (ref <150)
VLDL: 15 mg/dL (ref 0–40)

## 2013-04-20 LAB — CBC WITH DIFFERENTIAL/PLATELET
BASOS ABS: 0 10*3/uL (ref 0.0–0.1)
Basophils Relative: 0 % (ref 0–1)
EOS ABS: 1.1 10*3/uL — AB (ref 0.0–0.7)
EOS PCT: 10 % — AB (ref 0–5)
HCT: 43.4 % (ref 36.0–46.0)
Hemoglobin: 15.3 g/dL — ABNORMAL HIGH (ref 12.0–15.0)
LYMPHS PCT: 18 % (ref 12–46)
Lymphs Abs: 2.1 10*3/uL (ref 0.7–4.0)
MCH: 32.8 pg (ref 26.0–34.0)
MCHC: 35.3 g/dL (ref 30.0–36.0)
MCV: 93.1 fL (ref 78.0–100.0)
Monocytes Absolute: 0.8 10*3/uL (ref 0.1–1.0)
Monocytes Relative: 7 % (ref 3–12)
Neutro Abs: 7.4 10*3/uL (ref 1.7–7.7)
Neutrophils Relative %: 65 % (ref 43–77)
PLATELETS: 313 10*3/uL (ref 150–400)
RBC: 4.66 MIL/uL (ref 3.87–5.11)
RDW: 12.7 % (ref 11.5–15.5)
WBC: 11.4 10*3/uL — AB (ref 4.0–10.5)

## 2013-04-20 LAB — BASIC METABOLIC PANEL
BUN: 14 mg/dL (ref 6–23)
CALCIUM: 9.6 mg/dL (ref 8.4–10.5)
CO2: 24 meq/L (ref 19–32)
Chloride: 102 mEq/L (ref 96–112)
Creatinine, Ser: 0.76 mg/dL (ref 0.50–1.10)
GFR calc Af Amer: >90 mL/min (ref >90)
Glucose, Bld: 101 mg/dL — ABNORMAL HIGH (ref 70–99)
Potassium: 3.7 mEq/L (ref 3.7–5.3)
SODIUM: 143 meq/L (ref 137–147)

## 2013-04-20 LAB — CBC
HCT: 40.5 % (ref 36.0–46.0)
Hemoglobin: 14.5 g/dL (ref 12.0–15.0)
MCH: 33.2 pg (ref 26.0–34.0)
MCHC: 35.8 g/dL (ref 30.0–36.0)
MCV: 92.7 fL (ref 78.0–100.0)
PLATELETS: 299 10*3/uL (ref 150–400)
RBC: 4.37 MIL/uL (ref 3.87–5.11)
RDW: 12.7 % (ref 11.5–15.5)
WBC: 10 10*3/uL (ref 4.0–10.5)

## 2013-04-20 LAB — CREATININE, SERUM: CREATININE: 0.71 mg/dL (ref 0.50–1.10)

## 2013-04-20 LAB — SEDIMENTATION RATE: Sed Rate: 2 mm/hr (ref 0–22)

## 2013-04-20 MED ORDER — VANCOMYCIN HCL IN DEXTROSE 1-5 GM/200ML-% IV SOLN
1000.0000 mg | Freq: Once | INTRAVENOUS | Status: AC
  Administered 2013-04-20: 1000 mg via INTRAVENOUS
  Filled 2013-04-20: qty 200

## 2013-04-20 MED ORDER — METHYLPREDNISOLONE SODIUM SUCC 125 MG IJ SOLR
125.0000 mg | Freq: Two times a day (BID) | INTRAMUSCULAR | Status: DC
  Administered 2013-04-21: 125 mg via INTRAVENOUS
  Filled 2013-04-20 (×2): qty 2

## 2013-04-20 MED ORDER — METHYLPREDNISOLONE SODIUM SUCC 125 MG IJ SOLR
125.0000 mg | Freq: Once | INTRAMUSCULAR | Status: AC
  Administered 2013-04-20: 125 mg via INTRAVENOUS
  Filled 2013-04-20: qty 2

## 2013-04-20 MED ORDER — VANCOMYCIN HCL IN DEXTROSE 750-5 MG/150ML-% IV SOLN
750.0000 mg | INTRAVENOUS | Status: DC
  Administered 2013-04-21: 750 mg via INTRAVENOUS
  Filled 2013-04-20 (×2): qty 150

## 2013-04-20 MED ORDER — HEPARIN SODIUM (PORCINE) 5000 UNIT/ML IJ SOLN
5000.0000 [IU] | Freq: Three times a day (TID) | INTRAMUSCULAR | Status: DC
  Administered 2013-04-20 – 2013-04-21 (×3): 5000 [IU] via SUBCUTANEOUS
  Filled 2013-04-20 (×8): qty 1

## 2013-04-20 NOTE — H&P (Addendum)
Hospitalist Admission History and Physical  Patient name: Melanie Harris Medical record number: [id number] Date of birth: 07-28-1960 Age: 53 y.o. Gender: female  Primary Care Provider: Merrilee Seashore, MD  Chief Complaint: Rash, posterior thigh abscess History of Present Illness:This is a 53 y.o. year old female with prior history of recurrent rash and recurrent right posterior thigh abscess presenting with rash and posterior thigh abscess. Patient reports she's had progressive diffuse erythema scaling and itching over the past week. Has had some mild subjective chills at home over the past 24-48 hours. Patient has been seen for similar symptoms over the past 2 years. 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 Also has a history of right posterior thigh abscess. Patient states that she was due for an incision and drainage 2 years ago. However, was unable to obtain this when she lost her insurance. No new medications. No known history of diabetes. Denies any illicit drug use. Active smoker. Rash is primarily pruritic as well as mildly burning. Has been using over-the-counter Benadryl with minimal improvement in symptoms. States that she has had similar flares in the past that required steroids and abx for clearance. Denies any other systemic sxs.   In the ER, patient hemodynamically stable though a stage I to 2 hypertensive blood pressures. R posterior thigh abscess drained at bedside. Per EDPA, maily bloody drainage. Asked to send off for cx. WBC mildly elevated @ 11.4. Afebrile.   Patient Active Problem List   Diagnosis Date Noted  . Rash 08/21/2011  . Abscess of thigh   . Abscess of right thigh 07/25/2011   Past Medical History: Past Medical History  Diagnosis Date  . Abscess of thigh     right    Past Surgical History: Past Surgical History  Procedure Laterality Date  . Abdominal hysterectomy  2004  . Tonsillectomy   10-12 yrs old    Social History: History   Social History  . Marital Status: Divorced    Spouse Name: N/A    Number of Children: N/A  . Years of Education: N/A   Social History Main Topics  . Smoking status: Current Every Day Smoker -- 0.50 packs/day  . Smokeless tobacco: None  . Alcohol Use: 0.6 oz/week    1 Glasses of wine per week  . Drug Use: No  . Sexual Activity: None   Other Topics Concern  . None   Social History Narrative  . None    Family History: Family History  Problem Relation Age of Onset  . Lung cancer Father   . Heart failure Mother     Allergies: Allergies  Allergen Reactions  . Doxycycline     Rash     Current Facility-Administered Medications  Medication Dose Route Frequency Provider Last Rate Last Dose  . heparin injection 5,000 Units  5,000 Units Subcutaneous 3 times per day Shanda Howells, MD      . methylPREDNISolone sodium succinate (SOLU-MEDROL) 125 mg/2 mL injection 125 mg  125 mg Intravenous Once Norman Herrlich, NP      . methylPREDNISolone sodium succinate (SOLU-MEDROL) 125 mg/2 mL injection 125 mg  125 mg Intravenous Q12H Shanda Howells, MD      . vancomycin (VANCOCIN) IVPB 1000 mg/200 mL premix  1,000 mg Intravenous Once [employer], [facility]      . [START ON 04/21/2013] vancomycin (VANCOCIN) IVPB 750 mg/150 ml premix  750 mg Intravenous Q24H Rande Lawman [place], [facility]  Current Outpatient Prescriptions  Medication Sig Dispense Refill  . diphenhydrAMINE (BENADRYL) 25 MG tablet Take 50 mg by mouth every 6 (six) hours as needed for itching.      . hydrocortisone cream 0.5 % Apply 1 application topically 2 (two) times daily as needed for itching.      Marland Kitchen ibuprofen (ADVIL,MOTRIN) 200 MG tablet Take 400 mg by mouth every 6 (six) hours as needed (pain).        Review Of Systems: 12 point ROS negative except as noted above in HPI.  Physical Exam: Filed Vitals:   04/20/13 2042  BP: 169/89  Pulse: 93  Temp: 98.5 F (36.9 C)    Resp: 18    General: underweight  HEENT: PERRLA and extra ocular movement intact Heart: S1, S2 normal, no murmur, rub or gallop, regular rate and rhythm Lungs: clear to auscultation, no wheezes or rales and unlabored breathing Abdomen: abdomen is soft without significant tenderness, masses, organomegaly or guarding Extremities: extremities normal, atraumatic, no cyanosis or edema and diffuse erythematous rash, + scaling Skin:+ rash. No oral involvement          Neurology: normal without focal findings  Labs and Imaging: Lab Results  Component Value Date/Time   NA 143 04/20/2013  4:25 PM   K 3.7 04/20/2013  4:25 PM   CL 102 04/20/2013  4:25 PM   CO2 24 04/20/2013  4:25 PM   BUN 14 04/20/2013  4:25 PM   CREATININE 0.76 04/20/2013  4:25 PM   GLUCOSE 101* 04/20/2013  4:25 PM   Lab Results  Component Value Date   WBC 11.4* 04/20/2013   HGB 15.3* 04/20/2013   HCT 43.4 04/20/2013   MCV 93.1 04/20/2013   PLT 313 04/20/2013    No results found.   Assessment and Plan: Melanie Harris is a 53 y.o. year old female presenting with rash, cellulitis, post thigh abscess.   Rash; Suspect recurrence of cellulitis. Acute on chronic issue. No oral involvement. Although on the differential, Stevens-Johnson's and toxic shock syndrome lower. Start on IV Solu-Medrol and vancomycin. Continue to follow clinically. We'll also check sedimentation rate and ANA. Posterior thigh abscess: Drained at bedside. Wound culture pending. Consider surgery consult as this is been a recurrent or chronic issue as well. May benefit from surgical excision. Defer to am team.  HTN: Persistently elevated blood pressures. Asymptomatic. Check  Risk stratification labs including hemoglobin A1c, TSH, lipid panel. Will trend while in-house. Smoking: Tobacco abuse cessation counseling FEN/GI: Heart healthy diet. Prophylaxis: Subcutaneous heparin Disposition: Pending further evaluation Code Status: Full  code       Shanda Howells MD  Pager: [phone]

## 2013-04-20 NOTE — ED Notes (Signed)
Patient requested to have dressing changed to incision to the back of her right thigh.  Old dresing removed and new applied

## 2013-04-20 NOTE — ED Notes (Signed)
Pt here for rash to arms back and stomach, pt reprots no relief with otc meds and creams.

## 2013-04-20 NOTE — Progress Notes (Signed)
Pt admitted to the unit. Pt is alert and oriented. Pt oriented to room, staff, and call bell. Bed in lowest position. Full assessment to Epic. Call bell with in reach. Told to call for assists. Will continue to monitor.  Chantil Bari E  

## 2013-04-20 NOTE — ED Provider Notes (Signed)
CSN: [id number]     Arrival date & time 04/20/13  1400 History  This chart was scribed for Etta Quill, NP working with Blanchard Kelch, MD by Roxan Diesel, ED Scribe. This patient was seen in room TR10C/TR10C and the patient's care was started at 4:11 PM.   Chief Complaint  Patient presents with  . Rash    Patient is a 53 y.o. female presenting with rash. The history is provided by the patient.  Rash Location:  Torso and shoulder/arm Shoulder/arm rash location:  L arm and R arm Quality: dryness, itchiness, redness and scaling   Severity:  Moderate Onset quality:  Gradual Duration:  1 month Progression:  Worsening Chronicity:  Recurrent (pt states h/o similar symptoms due to staph infection) Context: not food, not medications and not new detergent/soap   Ineffective treatments:  Antihistamines and topical steroids Associated symptoms: no fever and no shortness of breath     HPI Comments: LORALYN RACHEL is a 53 y.o. female who presents to the Emergency Department complaining of a progressively-worsening, itchy red rash that has been ongoing for one month.  Pt states the rash began on her right arm and was initially draining in that area but has since become "dry."  The rash has since spread to both arms, back and stomach.  She states she had chills today but denies measured fever.  She denies SOB.  She has been using hydrocortisone cream and using Benadryl, with minimal relief of itching.  Pt admits to h/o staph infection one year ago and states she had a similar rash at that time.  She reports she was placed on prednisone and antibiotics for this infection with relief.  She denies recent changes to cosmetic or hygiene products or suspect food intake.  Pt takes no medications regularly.   Past Medical History  Diagnosis Date  . Abscess of thigh     right    Past Surgical History  Procedure Laterality Date  . Abdominal hysterectomy  2004  . Tonsillectomy  10-12 yrs old     Family History  Problem Relation Age of Onset  . Lung cancer Father   . Heart failure Mother     History  Substance Use Topics  . Smoking status: Current Every Day Smoker -- 0.50 packs/day  . Smokeless tobacco: Not on file  . Alcohol Use: 0.6 oz/week    1 Glasses of wine per week    OB History   Grav Para Term Preterm Abortions TAB SAB Ect Mult Living                   Review of Systems  Constitutional: Positive for chills. Negative for fever.  Respiratory: Negative for shortness of breath.   Skin: Positive for rash.  All other systems reviewed and are negative.      Allergies  Doxycycline  Home Medications   Current Outpatient Rx  Name  Route  Sig  Dispense  Refill  . diphenhydrAMINE (BENADRYL) 25 MG tablet   Oral   Take 50 mg by mouth every 6 (six) hours as needed for itching.         . hydrocortisone cream 0.5 %   Topical   Apply 1 application topically 2 (two) times daily as needed for itching.         Marland Kitchen ibuprofen (ADVIL,MOTRIN) 200 MG tablet   Oral   Take 400 mg by mouth every 6 (six) hours as needed (pain).  BP 183/84  Pulse 115  Temp(Src) 98 F (36.7 C) (Oral)  Resp 18  Ht 5\' 7"  (1.702 m)  Wt 102 lb (46.267 kg)  BMI 15.97 kg/m2  SpO2 99%  Physical Exam  Nursing note and vitals reviewed. Constitutional: She is oriented to person, place, and time. She appears well-developed and well-nourished. No distress.  HENT:  Head: Normocephalic and atraumatic.  Eyes: EOM are normal.  Neck: Neck supple. No tracheal deviation present.  Cardiovascular: Normal rate.   Pulmonary/Chest: Effort normal. No respiratory distress.  Musculoskeletal: Normal range of motion.  Neurological: She is alert and oriented to person, place, and time.  Skin: Skin is warm and dry. Rash noted.  Scaly, erythematous rash to arms and torso. Quarter-sized localized fluctuant area at the posterior aspect of right thigh just above the knee.  Psychiatric: She  has a normal mood and affect. Her behavior is normal.    ED Course  Procedures (including critical care time) I&D performed on fluctuant area of thigh.  Blood drainage only noted from area.  Culture sample obtained and sent.  DIAGNOSTIC STUDIES: Oxygen Saturation is 99% on room air, normal by my interpretation.    COORDINATION OF CARE: 4:17 PM-Discussed treatment plan which includes labs with pt at bedside and pt agreed to plan.   9:17 PM-Consult complete with hospitalist who agreed to evaluate patient to determine whether she needs to be admitted.    Labs Review Labs Reviewed  CBC WITH DIFFERENTIAL - Abnormal; Notable for the following:    WBC 11.4 (*)    Hemoglobin 15.3 (*)    Eosinophils Relative 10 (*)    Eosinophils Absolute 1.1 (*)    All other components within normal limits  BASIC METABOLIC PANEL - Abnormal; Notable for the following:    Glucose, Bld 101 (*)    All other components within normal limits    Imaging Review No results found.   EKG Interpretation None     Patient discussed with and seen by Dr. Aline Brochure.  Patient with mild leukocytosis and tachycardia.  Not febrile.  Rash is concerning for its appearance similar to toxic shock.  Recommends admission for cellulitis and evaluation of rash.   MDM   Final diagnoses:  None   I personally performed the services described in this documentation, which was scribed in my presence. The recorded information has been reviewed and is accurate.         Norman Herrlich, NP 04/21/13 [phone]

## 2013-04-20 NOTE — Progress Notes (Signed)
ANTIBIOTIC CONSULT NOTE - INITIAL  Pharmacy Consult for vancomycin Indication: cellulitis  Allergies  Allergen Reactions  . Doxycycline     Rash     Patient Measurements: Height: 5\' 7"  (170.2 cm) Weight: 102 lb (46.267 kg) IBW/kg (Calculated) : 61.6 Adjusted Body Weight:   Vital Signs: Temp: 98.5 F (36.9 C) (03/16 2042) Temp src: Oral (03/16 2042) BP: 169/89 mmHg (03/16 2042) Pulse Rate: 93 (03/16 2042) Intake/Output from previous day:   Intake/Output from this shift:    Labs:  Recent Labs  04/20/13 1625  WBC 11.4*  HGB 15.3*  PLT 313  CREATININE 0.76   Estimated Creatinine Clearance: 60.8 ml/min (by C-G formula based on Cr of 0.76). No results found for this basename: VANCOTROUGH, VANCOPEAK, VANCORANDOM, GENTTROUGH, GENTPEAK, GENTRANDOM, TOBRATROUGH, TOBRAPEAK, TOBRARND, AMIKACINPEAK, AMIKACINTROU, AMIKACIN,  in the last 72 hours   Microbiology: No results found for this or any previous visit (from the past 720 hour(s)).  Medical History: Past Medical History  Diagnosis Date  . Abscess of thigh     right    Medications:  Anti-infectives   Start     Dose/Rate Route Frequency Ordered Stop   04/21/13 2200  vancomycin (VANCOCIN) IVPB 750 mg/150 ml premix     750 mg 150 mL/hr over 60 Minutes Intravenous Every 24 hours 04/20/13 2118     04/20/13 2130  vancomycin (VANCOCIN) IVPB 1000 mg/200 mL premix     1,000 mg 200 mL/hr over 60 Minutes Intravenous  Once 04/20/13 2117       Assessment: Melanie Harris presented to the ED with a rash on arms, back and stomach. Pt is afebrile but WBC is mildly elevated at 11.4. SCr is WNL.  Vanc 3/16>>  Goal of Therapy:  Vancomycin trough level 10-15 mcg/ml  Plan:  1. Vancomycin 1gm IV x 1 then 750mg  IV Q24H 2. F/u renal fxn, C&S, clinical status and trough at [facility], Rande Lawman 04/20/2013,9:19 PM

## 2013-04-21 DIAGNOSIS — L03119 Cellulitis of unspecified part of limb: Secondary | ICD-10-CM

## 2013-04-21 DIAGNOSIS — I10 Essential (primary) hypertension: Secondary | ICD-10-CM

## 2013-04-21 DIAGNOSIS — L02419 Cutaneous abscess of limb, unspecified: Principal | ICD-10-CM

## 2013-04-21 DIAGNOSIS — R21 Rash and other nonspecific skin eruption: Secondary | ICD-10-CM

## 2013-04-21 LAB — CBC WITH DIFFERENTIAL/PLATELET
BASOS ABS: 0 10*3/uL (ref 0.0–0.1)
Basophils Relative: 0 % (ref 0–1)
EOS PCT: 0 % (ref 0–5)
Eosinophils Absolute: 0 10*3/uL (ref 0.0–0.7)
HCT: 37.7 % (ref 36.0–46.0)
Hemoglobin: 13.2 g/dL (ref 12.0–15.0)
LYMPHS PCT: 12 % (ref 12–46)
Lymphs Abs: 0.6 10*3/uL — ABNORMAL LOW (ref 0.7–4.0)
MCH: 32.9 pg (ref 26.0–34.0)
MCHC: 35 g/dL (ref 30.0–36.0)
MCV: 94 fL (ref 78.0–100.0)
Monocytes Absolute: 0 10*3/uL — ABNORMAL LOW (ref 0.1–1.0)
Monocytes Relative: 1 % — ABNORMAL LOW (ref 3–12)
NEUTROS ABS: 3.9 10*3/uL (ref 1.7–7.7)
Neutrophils Relative %: 87 % — ABNORMAL HIGH (ref 43–77)
PLATELETS: 247 10*3/uL (ref 150–400)
RBC: 4.01 MIL/uL (ref 3.87–5.11)
RDW: 12.7 % (ref 11.5–15.5)
WBC: 4.5 10*3/uL (ref 4.0–10.5)

## 2013-04-21 LAB — COMPREHENSIVE METABOLIC PANEL
ALBUMIN: 3.4 g/dL — AB (ref 3.5–5.2)
ALT: 14 U/L (ref 0–35)
AST: 16 U/L (ref 0–37)
Alkaline Phosphatase: 77 U/L (ref 39–117)
BUN: 13 mg/dL (ref 6–23)
CALCIUM: 9 mg/dL (ref 8.4–10.5)
CHLORIDE: 104 meq/L (ref 96–112)
CO2: 23 meq/L (ref 19–32)
Creatinine, Ser: 0.67 mg/dL (ref 0.50–1.10)
GFR calc Af Amer: >90 mL/min (ref >90)
Glucose, Bld: 136 mg/dL — ABNORMAL HIGH (ref 70–99)
Potassium: 4 mEq/L (ref 3.7–5.3)
SODIUM: 143 meq/L (ref 137–147)
Total Bilirubin: 0.3 mg/dL (ref 0.3–1.2)
Total Protein: 6 g/dL (ref 6.0–8.3)

## 2013-04-21 LAB — TSH: TSH: 1.705 u[IU]/mL (ref 0.350–4.500)

## 2013-04-21 LAB — HEMOGLOBIN A1C
HEMOGLOBIN A1C: 5.3 % (ref <5.7)
Mean Plasma Glucose: 105 mg/dL (ref <117)

## 2013-04-21 MED ORDER — INFLUENZA VAC SPLIT QUAD 0.5 ML IM SUSP
0.5000 mL | INTRAMUSCULAR | Status: AC
  Administered 2013-04-22: 0.5 mL via INTRAMUSCULAR
  Filled 2013-04-21: qty 0.5

## 2013-04-21 MED ORDER — PREDNISONE 20 MG PO TABS
40.0000 mg | ORAL_TABLET | Freq: Every day | ORAL | Status: DC
  Administered 2013-04-22: 40 mg via ORAL
  Filled 2013-04-21 (×2): qty 2

## 2013-04-21 MED ORDER — FAMOTIDINE 20 MG PO TABS
20.0000 mg | ORAL_TABLET | Freq: Every day | ORAL | Status: DC
  Administered 2013-04-21 – 2013-04-22 (×2): 20 mg via ORAL
  Filled 2013-04-21 (×2): qty 1

## 2013-04-21 MED ORDER — PNEUMOCOCCAL VAC POLYVALENT 25 MCG/0.5ML IJ INJ
0.5000 mL | INJECTION | INTRAMUSCULAR | Status: AC
  Administered 2013-04-22: 0.5 mL via INTRAMUSCULAR
  Filled 2013-04-21: qty 0.5

## 2013-04-21 MED ORDER — DIPHENHYDRAMINE HCL 25 MG PO CAPS
25.0000 mg | ORAL_CAPSULE | Freq: Three times a day (TID) | ORAL | Status: DC
  Administered 2013-04-21 – 2013-04-22 (×2): 25 mg via ORAL
  Filled 2013-04-21 (×5): qty 1

## 2013-04-21 MED ORDER — ZOLPIDEM TARTRATE 5 MG PO TABS
5.0000 mg | ORAL_TABLET | Freq: Once | ORAL | Status: AC
  Administered 2013-04-21: 5 mg via ORAL
  Filled 2013-04-21: qty 1

## 2013-04-21 NOTE — ED Provider Notes (Signed)
Medical screening examination/treatment/procedure(s) were conducted as a shared visit with non-physician practitioner(s) and myself.  I personally evaluated the patient during the encounter.   EKG Interpretation None      I interviewed and examined the patient. Lungs are CTAB. Cardiac exam wnl. Abdomen soft. Scaling, erythematous rash to arms/torso. Small area of fluctuance to right post thigh. Pt notes hx of similar rash w/ staph infection in the past. Tachycardic here w/ mild leukocytosis. Doubt SJ's or TSS given well appearance but rash appears to be worsening w/ signs of system reaction - leukocytosis and tachycardia. Will admit for obs and abx.   Blanchard Kelch, MD 04/21/13 [phone]

## 2013-04-21 NOTE — Consult Note (Signed)
Pt seen and examined.  Area looks ok.  May need excision as outpatient down the road since recurrent.

## 2013-04-21 NOTE — Progress Notes (Signed)
UR completed. Patient changed to inpatient-requiring IV solumedrol and IV antibiotics.  

## 2013-04-21 NOTE — Progress Notes (Signed)
PATIENT DETAILS Name: Melanie Harris Age: 53 y.o. Sex: female Date of Birth: January 21, 1961 Admit Date: 04/20/2013 Admitting Physician Shanda Howells, MD HBZ:[email or web address],BPZWC, MD  Subjective: No major issues overnight.  Assessment/Plan: Right thigh abscess - Continue vancomycin, have consulted surgery for a formal incision and drainage - Continue to monitor, and await formal surgical evaluation  Rash -? Etiology - Stop Solu-Medrol, start prednisone and taper rapidly. We'll also start Benadryl and Pepcid. - Patient will need outpatient followup with her dermatologist.  Hypertension - Blood pressure reasonably well controlled without the use of any antihypertensive medications, continue to monitor.  Smoking: - Tobacco abuse cessation counseling  Disposition: Remain inpatient  DVT Prophylaxis: Prophylactic Heparin   Code Status: Full code   Family Communication No family members at bedside  Procedures:  None  CONSULTS:  general surgery  Time spent 40 minutes-which includes 50% of the time with face-to-face with patient/ family and coordinating care related to the above assessment and plan.    MEDICATIONS: Scheduled Meds: . heparin  5,000 Units Subcutaneous 3 times per day  . [START ON 04/22/2013] influenza vac split quadrivalent PF  0.5 mL Intramuscular Tomorrow-1000  . methylPREDNISolone (SOLU-MEDROL) injection  125 mg Intravenous Q12H  . [START ON 04/22/2013] pneumococcal 23 valent vaccine  0.5 mL Intramuscular Tomorrow-1000  . vancomycin  750 mg Intravenous Q24H   Continuous Infusions:  PRN Meds:.  Antibiotics: Anti-infectives   Start     Dose/Rate Route Frequency Ordered Stop   04/21/13 2200  vancomycin (VANCOCIN) IVPB 750 mg/150 ml premix     750 mg 150 mL/hr over 60 Minutes Intravenous Every 24 hours 04/20/13 2118     04/20/13 2130  vancomycin (VANCOCIN) IVPB 1000 mg/200 mL premix     1,000 mg 200 mL/hr over 60 Minutes Intravenous  Once  04/20/13 2117 04/20/13 2305       PHYSICAL EXAM: Vital signs in last 24 hours: Filed Vitals:   04/20/13 1653 04/20/13 2042 04/20/13 2322 04/21/13 0558  BP: 163/91 169/89 157/80 126/71  Pulse: 99 93 99 96  Temp:  98.5 F (36.9 C) 98.5 F (36.9 C) 98.1 F (36.7 C)  TempSrc:  Oral Oral Oral  Resp:  18 16 16   Height:   5\' 7"  (1.702 m)   Weight:   46.1 kg (101 lb 10.1 oz)   SpO2: 100% 100% 99% 96%    Weight change:  Filed Weights   04/20/13 1406 04/20/13 2322  Weight: 46.267 kg (102 lb) 46.1 kg (101 lb 10.1 oz)   Body mass index is 15.91 kg/(m^2).   Gen Exam: Awake and alert with clear speech.   Neck: Supple, No JVD.   Chest: B/L Clear.   CVS: S1 S2 Regular, no murmurs.  Abdomen: soft, BS +, non tender, non distended.  Extremities: no edema, lower extremities warm to touch.Approx quarter sized (approx 2 cm diameter with 4 cm of mild erythema) remnant of abscess on R posterior thigh s/p I&D in ED. still some amount of fluctuation present. Neurologic: Non Focal.   Skin: Widespread rash. Mildly erythematous with scaling. Non tender, no obvious discharge. Present on R palm, but spares soles of feet. Rash present on back, stomach, thighs, and forearms. Rash along upper back but not present in axilla. Wounds: N/A.   Intake/Output from previous day: No intake or output data in the 24 hours ending 04/21/13 1308   LAB RESULTS: CBC  Recent Labs Lab 04/20/13 1625 04/20/13 2153 04/21/13 0615  WBC 11.4* 10.0 4.5  HGB 15.3* 14.5 13.2  HCT 43.4 40.5 37.7  PLT 313 299 247  MCV 93.1 92.7 94.0  MCH 32.8 33.2 32.9  MCHC 35.3 35.8 35.0  RDW 12.7 12.7 12.7  LYMPHSABS 2.1  --  0.6*  MONOABS 0.8  --  0.0*  EOSABS 1.1*  --  0.0  BASOSABS 0.0  --  0.0    Chemistries   Recent Labs Lab 04/20/13 1625 04/20/13 2153 04/21/13 0615  NA 143  --  143  K 3.7  --  4.0  CL 102  --  104  CO2 24  --  23  GLUCOSE 101*  --  136*  BUN 14  --  13  CREATININE 0.76 0.71 0.67  CALCIUM 9.6   --  9.0    CBG: No results found for this basename: GLUCAP,  in the last 168 hours  GFR Estimated Creatinine Clearance: 60.5 ml/min (by C-G formula based on Cr of 0.67).  Coagulation profile No results found for this basename: INR, PROTIME,  in the last 168 hours  Cardiac Enzymes No results found for this basename: CK, CKMB, TROPONINI, MYOGLOBIN,  in the last 168 hours  No components found with this basename: POCBNP,  No results found for this basename: DDIMER,  in the last 72 hours No results found for this basename: HGBA1C,  in the last 72 hours  Recent Labs  04/20/13 2153  CHOL 151  HDL 65  LDLCALC 71  TRIG 75  CHOLHDL 2.3    Recent Labs  04/20/13 2220  TSH 1.705   No results found for this basename: VITAMINB12, FOLATE, FERRITIN, TIBC, IRON, RETICCTPCT,  in the last 72 hours No results found for this basename: LIPASE, AMYLASE,  in the last 72 hours  Urine Studies No results found for this basename: UACOL, UAPR, USPG, UPH, UTP, UGL, UKET, UBIL, UHGB, UNIT, UROB, ULEU, UEPI, UWBC, URBC, UBAC, CAST, CRYS, UCOM, BILUA,  in the last 72 hours  MICROBIOLOGY: Recent Results (from the past 240 hour(s))  WOUND CULTURE     Status: None   Collection Time    04/20/13 10:07 PM      Result Value Ref Range Status   Specimen Description WOUND RIGHT THIGH   Final   Special Requests NONE   Final   Gram Stain     Final   Value: RARE WBC PRESENT, PREDOMINANTLY PMN     NO SQUAMOUS EPITHELIAL CELLS SEEN     NO ORGANISMS SEEN     Performed at [facility]   Culture     Final   Value: NO GROWTH     Performed at [facility]   Report Status PENDING   Incomplete    RADIOLOGY STUDIES/RESULTS: No results found.  Oren Binet, MD  [facility] Pager:[phone]  If 7PM-7AM, please contact night-coverage [email or web address] Password [email or web address] 04/21/2013, 1:08 PM   LOS: 1 day

## 2013-04-21 NOTE — Plan of Care (Signed)
Problem: Phase I Progression Outcomes Goal: Initial discharge plan identified Outcome: Completed/Met Date Met:  04/21/13 To return home

## 2013-04-21 NOTE — Consult Note (Signed)
Melanie Harris 1960-11-01  [id number].    Requesting MD: Nena Alexander Chief Complaint/Reason for Consult: Posterior thigh abscess HPI:  Melanie Harris is a 53 y.o. female with history of recurrent body rash and recurrent right posterior thigh abscess presenting with both.  Pt presented to the [facility] with same on 04/20/13 and was admitted to medicine. Pt reported worsening diffuse erythematous scaley rash over the past week.  She states this is the third episode in approx 2 years, but can think of no inciting factors. She reports having to have the abscess drained multiple times, this being the third but to the same spot. Denies any further abscesses. She has been treated in the past with Prednisone and an "antibiotic", sometimes taking nearly two months for the rash to clear.  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 She states that the products she uses are mild, and that she always uses gloves.  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  Pt had an I&D of said abscess last night with wound culture pending (no growth as of < 1 day).  She states the rash spares the soles of her feet, but has a small lesion on her right palm, and possiblly on her face. She says it is itchy but denies tenderness. She denies N/V, diarrhea, abdominal pain, fevers, urinary changes, bowel changes. She denies any previous diagnosis of eczema or psoriasis. She does report chills over the past several days.  ROS: All systems reviewed and otherwise negative except for as above  Family History  Problem Relation Age of Onset  . Lung cancer Father   . Heart failure Mother     Past Medical History  Diagnosis Date  . Abscess of thigh     right    Past Surgical History  Procedure Laterality Date  . Abdominal hysterectomy  2004  . Tonsillectomy  10-11 yrs old    Social History:  reports that she has been smoking.  She does not have any smokeless tobacco history on  file. She reports that she drinks about 0.6 ounces of alcohol per week. She reports that she does not use illicit drugs.  Allergies:  Allergies  Allergen Reactions  . Doxycycline     Rash    Medications Prior to Admission  Medication Sig Dispense Refill  . diphenhydrAMINE (BENADRYL) 25 MG tablet Take 50 mg by mouth every 6 (six) hours as needed for itching.      . hydrocortisone cream 0.5 % Apply 1 application topically 2 (two) times daily as needed for itching.      Marland Kitchen ibuprofen (ADVIL,MOTRIN) 200 MG tablet Take 400 mg by mouth every 6 (six) hours as needed (pain).         Blood pressure 126/71, pulse 96, temperature 98.1 F (36.7 C), temperature source Oral, resp. rate 16, height _0  (1.702 m), weight 101 lb 10.1 oz (46.1 kg), SpO2 96.00%. Physical Exam: General: very pleasant, thin,  white female who is seated in a chair in NAD HEENT: head is normocephalic, atraumatic.  Sclera are noninjected.  PERRL.  Ears and nose without any masses or lesions. Heart: regular, rate, and rhythm.  No obvious murmurs, gallops, or rubs noted. Lungs: CTAB, no wheezes, rhonchi, or rales noted.  Respiratory effort nonlabored Abd: soft, NT/ND, +BS, no masses, hernias, or organomegaly MS: all 4 extremities are symmetrical with no cyanosis, clubbing, or edema.  Skin:  Widespread rash. Mildly  erythematous with scaling.  Non tender, no obvious discharge. Present on R palm, but spares soles of feet.  Rash present on back, stomach, thighs, and forearms.  Rash along upper back but not present in axilla. Approx quarter sized (approx 2 cm diameter with 4 cm of mild erythema) remnant of abscess on R posterior thigh s/p I&D in ED.  No obvious fluctuance or induration. No current discharge or bleeding, minimal sanguinous drainage when probed.  Probed to a depth of 1cm with cotton applicator. Some hardness to medial inferior portion of abscess, likely chronic scarring. Psych: A&Ox3 with an appropriate affect.  All  pictures by Shanda Howells, MD on 04/20/13 R posterior thigh   Left arm (ventral)   Left arm (dorsal)   Abdomen   Right palm and forearm (ventral)    Results for orders placed during the hospital encounter of 04/20/13 (from the past 48 hour(s))  CBC WITH DIFFERENTIAL     Status: Abnormal   Collection Time    04/20/13  4:25 PM      Result Value Ref Range   WBC 11.4 (*) 4.0 - 10.5 K/uL   RBC 4.66  3.87 - 5.11 MIL/uL   Hemoglobin 15.3 (*) 12.0 - 15.0 g/dL   HCT 43.4  36.0 - 46.0 %   MCV 93.1  78.0 - 100.0 fL   MCH 32.8  26.0 - 34.0 pg   MCHC 35.3  30.0 - 36.0 g/dL   RDW 12.7  11.5 - 15.5 %   Platelets 313  150 - 400 K/uL   Neutrophils Relative % 65  43 - 77 %   Neutro Abs 7.4  1.7 - 7.7 K/uL   Lymphocytes Relative 18  12 - 46 %   Lymphs Abs 2.1  0.7 - 4.0 K/uL   Monocytes Relative 7  3 - 12 %   Monocytes Absolute 0.8  0.1 - 1.0 K/uL   Eosinophils Relative 10 (*) 0 - 5 %   Eosinophils Absolute 1.1 (*) 0.0 - 0.7 K/uL   Basophils Relative 0  0 - 1 %   Basophils Absolute 0.0  0.0 - 0.1 K/uL  BASIC METABOLIC PANEL     Status: Abnormal   Collection Time    04/20/13  4:25 PM      Result Value Ref Range   Sodium 143  137 - 147 mEq/L   Potassium 3.7  3.7 - 5.3 mEq/L   Chloride 102  96 - 112 mEq/L   CO2 24  19 - 32 mEq/L   Glucose, Bld 101 (*) 70 - 99 mg/dL   BUN 14  6 - 23 mg/dL   Creatinine, Ser 0.76  0.50 - 1.10 mg/dL   Calcium 9.6  8.4 - 10.5 mg/dL   GFR calc non Af Amer >90  >90 mL/min   GFR calc Af Amer >90  >90 mL/min   Comment: (NOTE)     The eGFR has been calculated using the CKD EPI equation.     This calculation has not been validated in all clinical situations.     eGFR's persistently <90 mL/min signify possible Chronic Kidney     Disease.  CBC     Status: None   Collection Time    04/20/13  9:53 PM      Result Value Ref Range   WBC 10.0  4.0 - 10.5 K/uL   RBC 4.37  3.87 - 5.11 MIL/uL   Hemoglobin 14.5  12.0 - 15.0 g/dL   HCT 40.5  36.0 - 46.0 %   MCV  92.7  78.0 - 100.0 fL   MCH 33.2  26.0 - 34.0 pg   MCHC 35.8  30.0 - 36.0 g/dL   RDW 12.7  11.5 - 15.5 %   Platelets 299  150 - 400 K/uL  CREATININE, SERUM     Status: None   Collection Time    04/20/13  9:53 PM      Result Value Ref Range   Creatinine, Ser 0.71  0.50 - 1.10 mg/dL   GFR calc non Af Amer >90  >90 mL/min   GFR calc Af Amer >90  >90 mL/min   Comment: (NOTE)     The eGFR has been calculated using the CKD EPI equation.     This calculation has not been validated in all clinical situations.     eGFR's persistently <90 mL/min signify possible Chronic Kidney     Disease.  LIPID PANEL     Status: None   Collection Time    04/20/13  9:53 PM      Result Value Ref Range   Cholesterol 151  0 - 200 mg/dL   Triglycerides 75  <150 mg/dL   HDL 65  >39 mg/dL   Total CHOL/HDL Ratio 2.3     VLDL 15  0 - 40 mg/dL   LDL Cholesterol 71  0 - 99 mg/dL   Comment:            Total Cholesterol/HDL:CHD Risk     Coronary Heart Disease Risk Table                         Men   Women      1/2 Average Risk   3.4   3.3      Average Risk       5.0   4.4      2 X Average Risk   9.6   7.1      3 X Average Risk  23.4   11.0                Use the calculated Patient Ratio     above and the CHD Risk Table     to determine the patient's CHD Risk.                ATP III CLASSIFICATION (LDL):      <100     mg/dL   Optimal      100-129  mg/dL   Near or Above                        Optimal      130-159  mg/dL   Borderline      160-189  mg/dL   High      >190     mg/dL   Very High  WOUND CULTURE     Status: None   Collection Time    04/20/13 10:07 PM      Result Value Ref Range   Specimen Description WOUND RIGHT THIGH     Special Requests NONE     Gram Stain PENDING     Culture       Value: NO GROWTH     Performed at [facility]   Report Status PENDING    SEDIMENTATION RATE     Status: None   Collection Time    04/20/13 10:20 PM  Result Value Ref Range   Sed Rate 2  0 -  22 mm/hr  CBC WITH DIFFERENTIAL     Status: Abnormal   Collection Time    04/21/13  6:15 AM      Result Value Ref Range   WBC 4.5  4.0 - 10.5 K/uL   RBC 4.01  3.87 - 5.11 MIL/uL   Hemoglobin 13.2  12.0 - 15.0 g/dL   HCT 37.7  36.0 - 46.0 %   MCV 94.0  78.0 - 100.0 fL   MCH 32.9  26.0 - 34.0 pg   MCHC 35.0  30.0 - 36.0 g/dL   RDW 12.7  11.5 - 15.5 %   Platelets 247  150 - 400 K/uL   Neutrophils Relative % 87 (*) 43 - 77 %   Neutro Abs 3.9  1.7 - 7.7 K/uL   Lymphocytes Relative 12  12 - 46 %   Lymphs Abs 0.6 (*) 0.7 - 4.0 K/uL   Monocytes Relative 1 (*) 3 - 12 %   Monocytes Absolute 0.0 (*) 0.1 - 1.0 K/uL   Eosinophils Relative 0  0 - 5 %   Eosinophils Absolute 0.0  0.0 - 0.7 K/uL   Basophils Relative 0  0 - 1 %   Basophils Absolute 0.0  0.0 - 0.1 K/uL  COMPREHENSIVE METABOLIC PANEL     Status: Abnormal   Collection Time    04/21/13  6:15 AM      Result Value Ref Range   Sodium 143  137 - 147 mEq/L   Potassium 4.0  3.7 - 5.3 mEq/L   Chloride 104  96 - 112 mEq/L   CO2 23  19 - 32 mEq/L   Glucose, Bld 136 (*) 70 - 99 mg/dL   BUN 13  6 - 23 mg/dL   Creatinine, Ser 0.67  0.50 - 1.10 mg/dL   Calcium 9.0  8.4 - 10.5 mg/dL   Total Protein 6.0  6.0 - 8.3 g/dL   Albumin 3.4 (*) 3.5 - 5.2 g/dL   AST 16  0 - 37 U/L   ALT 14  0 - 35 U/L   Alkaline Phosphatase 77  39 - 117 U/L   Total Bilirubin 0.3  0.3 - 1.2 mg/dL   GFR calc non Af Amer >90  >90 mL/min   GFR calc Af Amer >90  >90 mL/min   Comment: (NOTE)     The eGFR has been calculated using the CKD EPI equation.     This calculation has not been validated in all clinical situations.     eGFR's persistently <90 mL/min signify possible Chronic Kidney     Disease.   No results found.    Assessment/Plan Rash R posterior thigh abscess, s/p I&D  1. No apparent surgical indication at this time, consult with Dr. Brantley Stage, but inpatient surgery not likely needed. 2. 1/4" iodoform packing TID and Sitz baths. 3. Consider  outpatient follow up in clinic for possible elective surgical resection. 4. Able to be d/c from a surgical stand point with wound care management.   Barrington Ellison, PA-S [facility] Surgery 04/21/2013, 9:07 AM Pager: [phone]  ----------------------------------------------------------------------------------------------------------- General Surgery PA Preceptor Note:  I agree with the above PA students findings as above.  Coralie Keens, PA-C General Surgery [facility] Surgery Pager: [phone] Office: [phone]

## 2013-04-22 LAB — COMPREHENSIVE METABOLIC PANEL
ALK PHOS: 66 U/L (ref 39–117)
ALT: 13 U/L (ref 0–35)
AST: 15 U/L (ref 0–37)
Albumin: 3.2 g/dL — ABNORMAL LOW (ref 3.5–5.2)
BUN: 16 mg/dL (ref 6–23)
CHLORIDE: 105 meq/L (ref 96–112)
CO2: 23 mEq/L (ref 19–32)
CREATININE: 0.61 mg/dL (ref 0.50–1.10)
Calcium: 8.8 mg/dL (ref 8.4–10.5)
GFR calc Af Amer: >90 mL/min (ref >90)
GFR calc non Af Amer: >90 mL/min (ref >90)
Glucose, Bld: 122 mg/dL — ABNORMAL HIGH (ref 70–99)
Potassium: 3.9 mEq/L (ref 3.7–5.3)
Sodium: 142 mEq/L (ref 137–147)
Total Protein: 5.6 g/dL — ABNORMAL LOW (ref 6.0–8.3)

## 2013-04-22 MED ORDER — MAGNESIUM SULFATE GRAN
GRANULES | Freq: Every day | Status: DC
Start: 2013-04-22 — End: 2013-04-22

## 2013-04-22 MED ORDER — AMOXICILLIN 500 MG PO TABS: 500.0000 mg | ORAL_TABLET | Freq: Two times a day (BID) | ORAL | Status: DC

## 2013-04-22 MED ORDER — SULFAMETHOXAZOLE-TRIMETHOPRIM 800-160 MG PO TABS: 1.0000 | ORAL_TABLET | Freq: Two times a day (BID) | ORAL | Status: DC

## 2013-04-22 NOTE — Discharge Summary (Signed)
[name] to be D/C'd Home per MD order.  Discussed with the patient and all questions fully answered.    Medication List         amoxicillin 500 MG tablet  Commonly known as:  AMOXIL  Take 1 tablet (500 mg total) by mouth 2 (two) times daily.     diphenhydrAMINE 25 MG tablet  Commonly known as:  BENADRYL  Take 50 mg by mouth every 6 (six) hours as needed for itching.     hydrocortisone cream 0.5 %  Apply 1 application topically 2 (two) times daily as needed for itching.     ibuprofen 200 MG tablet  Commonly known as:  ADVIL,MOTRIN  Take 400 mg by mouth every 6 (six) hours as needed (pain).     sulfamethoxazole-trimethoprim 800-160 MG per tablet  Commonly known as:  BACTRIM DS  Take 1 tablet by mouth 2 (two) times daily.        VVS, Skin clean, dry and intact without evidence of skin break down, no evidence of skin tears noted. IV catheter discontinued intact. Site without signs and symptoms of complications. Dressing and pressure applied.  An After Visit Summary was printed and given to the patient.  D/c education completed with patient/family including follow up instructions, medication list, d/c activities limitations if indicated, with other d/c instructions as indicated by MD - patient able to verbalize understanding, all questions fully answered.   Patient instructed to return to ED, call 911, or call MD for any changes in condition.   Patient escorted walking, and D/C home via private auto.  Audria Nine F 04/22/2013 1:41 PM

## 2013-04-22 NOTE — Progress Notes (Signed)
Education was done on dressing change. Pt was able to understand how to do dressing change and how often. All questions were answered.

## 2013-04-22 NOTE — Progress Notes (Signed)
  Subjective: Pt pain still present to right posterior thigh.  No N/V.  Tolerating diet well.  Having BM's.   Objective: Vital signs in last 24 hours: Temp:  [98.2 F (36.8 C)-98.4 F (36.9 C)] 98.2 F (36.8 C) (03/18 0550) Pulse Rate:  [83-97] 83 (03/18 0550) Resp:  [18] 18 (03/18 0550) BP: (116-124)/(60-74) 116/61 mmHg (03/18 0550) SpO2:  [96 %-98 %] 98 % (03/18 0550) Last BM Date: 04/19/13  Intake/Output from previous day:   Intake/Output this shift:    PE: Gen:  Alert, NAD, pleasant Skin: Widespread rash.  Approx quarter sized (approx 2 cm diameter with 4 cm of chronic scarring) remnant of abscess on R posterior thigh s/p I&D in ED. No obvious fluctuance or induration. No current discharge or bleeding, minimal sanguinous drainage when probed. Probed to a depth of 1cm with cotton applicator. Chronic scarring noted.    Lab Results:   Recent Labs  04/20/13 2153 04/21/13 0615  WBC 10.0 4.5  HGB 14.5 13.2  HCT 40.5 37.7  PLT 299 247   BMET  Recent Labs  04/21/13 0615 04/22/13 0456  NA 143 142  K 4.0 3.9  CL 104 105  CO2 23 23  GLUCOSE 136* 122*  BUN 13 16  CREATININE 0.67 0.61  CALCIUM 9.0 8.8   PT/INR No results found for this basename: LABPROT, INR,  in the last 72 hours CMP     Component Value Date/Time   NA 142 04/22/2013 0456   K 3.9 04/22/2013 0456   CL 105 04/22/2013 0456   CO2 23 04/22/2013 0456   GLUCOSE 122* 04/22/2013 0456   BUN 16 04/22/2013 0456   CREATININE 0.61 04/22/2013 0456   CALCIUM 8.8 04/22/2013 0456   PROT 5.6* 04/22/2013 0456   ALBUMIN 3.2* 04/22/2013 0456   AST 15 04/22/2013 0456   ALT 13 04/22/2013 0456   ALKPHOS 66 04/22/2013 0456   BILITOT <0.2* 04/22/2013 0456   GFRNONAA >90 04/22/2013 0456   GFRAA >90 04/22/2013 0456   Lipase  No results found for this basename: lipase       Studies/Results: No results found.  Anti-infectives: Anti-infectives   Start     Dose/Rate Route Frequency Ordered Stop   04/21/13 2200   vancomycin (VANCOCIN) IVPB 750 mg/150 ml premix     750 mg 150 mL/hr over 60 Minutes Intravenous Every 24 hours 04/20/13 2118     04/20/13 2130  vancomycin (VANCOCIN) IVPB 1000 mg/200 mL premix     1,000 mg 200 mL/hr over 60 Minutes Intravenous  Once 04/20/13 2117 04/20/13 2305       Assessment/Plan R posterior thigh abscess, s/p I&D by EDP Rash   Plan: 1. No apparent surgical indication at this time, Dr. Brantley Stage recommends allowing this acute flair to resolve and then definitive excision in a few weeks 2. 1/4" iodoform packing strip BID and showers after removing packing and before repacking 3. Able to be d/c from a surgical stand point with wound care management - HH if needed.   4. Will sign off, call with questions/concerns     LOS: 2 days    Harris, Melanie Wix 04/22/2013, 9:42 AM Pager: [phone]

## 2013-04-22 NOTE — Progress Notes (Signed)
Will see as outpatient. May need excision of the area.

## 2013-04-22 NOTE — Discharge Summary (Signed)
Physician Discharge Summary  Melanie Harris TFT:[id number] DOB: November 16, 1960 DOA: 04/20/2013  PCP: Merrilee Seashore, MD  Admit date: 04/20/2013 Discharge date: 04/22/2013  Time spent: 40 minutes  Recommendations for Outpatient Follow-up:  1. Follow up with primary care physician within one week.  Discharge Diagnoses:  Principal Problem:   Abscess of right thigh Active Problems:   Abscess of thigh   Rash   Discharge Condition: Stable  Diet recommendation: Regular  Filed Weights   04/20/13 1406 04/20/13 2322  Weight: 46.267 kg (102 lb) 46.1 kg (101 lb 10.1 oz)    History of present illness:  This is a 53 y.o. year old female with prior history of recurrent rash and recurrent right posterior thigh abscess presenting with rash and posterior thigh abscess. Patient reports she's had progressive diffuse erythema scaling and itching over the past week. Has had some mild subjective chills at home over the past 24-48 hours. Patient has been seen for similar symptoms over the past 2 years. 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 Also has a history of right posterior thigh abscess. Patient states that she was due for an incision and drainage 2 years ago. However, was unable to obtain this when she lost her insurance. No new medications. No known history of diabetes. Denies any illicit drug use. Active smoker. Rash is primarily pruritic as well as mildly burning. Has been using over-the-counter Benadryl with minimal improvement in symptoms. States that she has had similar flares in the past that required steroids and abx for clearance. Denies any other systemic sxs.   Hospital Course:   1. Right thigh abscess: Patient admitted to the hospital because of right thigh abscess, she is to have cellulitis along with that, status post incision and drainage in the emergency department. Patient also placed on vancomycin, general surgery  consulted. General surgery recommended to continue antibiotics and to evaluate as outpatient after the antibiotics done to see if patient needs incision and drainage. Patient discharged home on Bactrim and amoxicillin, she is allergic to doxycycline.  2. Rash: Questionable etiology, patient on Solu-Medrol started on prednisone taper. I don't think that is allergic rash, patient seems to have some type of chronic skin condition likely eczema. Patient to followup with dermatology as outpatient.  3. Hypertension: Patient is not on any antihypertensive at this point, blood pressure is reasonably controlled.  4. Tobacco use: Patient counseled about cessation of smoking.  Procedures:  None  Consultations:  General surgery  Discharge Exam: Filed Vitals:   04/22/13 0550  BP: 116/61  Pulse: 83  Temp: 98.2 F (36.8 C)  Resp: 18   General: Alert and awake, oriented x3, not in any acute distress. HEENT: anicteric sclera, pupils reactive to light and accommodation, EOMI CVS: S1-S2 clear, no murmur rubs or gallops Chest: clear to auscultation bilaterally, no wheezing, rales or rhonchi Abdomen: soft nontender, nondistended, normal bowel sounds, no organomegaly Extremities: no cyanosis, clubbing or edema noted bilaterally Neuro: Cranial nerves II-XII intact, no focal neurological deficits  Discharge Instructions     Medication List         amoxicillin 500 MG tablet  Commonly known as:  AMOXIL  Take 1 tablet (500 mg total) by mouth 2 (two) times daily.     diphenhydrAMINE 25 MG tablet  Commonly known as:  BENADRYL  Take 50 mg by mouth every 6 (six) hours as needed for itching.     hydrocortisone cream 0.5 %  Apply 1 application topically 2 (two) times daily as needed for itching.     ibuprofen 200 MG tablet  Commonly known as:  ADVIL,MOTRIN  Take 400 mg by mouth every 6 (six) hours as needed (pain).     sulfamethoxazole-trimethoprim 800-160 MG per tablet  Commonly known as:   BACTRIM DS  Take 1 tablet by mouth 2 (two) times daily.       Allergies  Allergen Reactions  . Doxycycline     Rash        Follow-up Information   Follow up with [facility], MD In 1 week.   Specialty:  Internal Medicine   Contact information:   [address] [place] [address] [phone]        The results of significant diagnostics from this hospitalization (including imaging, microbiology, ancillary and laboratory) are listed below for reference.    Significant Diagnostic Studies: No results found.  Microbiology: Recent Results (from the past 240 hour(s))  WOUND CULTURE     Status: None   Collection Time    04/20/13 10:07 PM      Result Value Ref Range Status   Specimen Description WOUND RIGHT THIGH   Final   Special Requests NONE   Final   Gram Stain     Final   Value: RARE WBC PRESENT, PREDOMINANTLY PMN     NO SQUAMOUS EPITHELIAL CELLS SEEN     NO ORGANISMS SEEN     Performed at [facility]   Culture     Final   Value: RARE STAPHYLOCOCCUS AUREUS     Note: RIFAMPIN AND GENTAMICIN SHOULD NOT BE USED AS SINGLE DRUGS FOR TREATMENT OF STAPH INFECTIONS.     Performed at [facility]   Report Status PENDING   Incomplete     Labs: Basic Metabolic Panel:  Recent Labs Lab 04/20/13 1625 04/20/13 2153 04/21/13 0615 04/22/13 0456  NA 143  --  143 142  K 3.7  --  4.0 3.9  CL 102  --  104 105  CO2 24  --  23 23  GLUCOSE 101*  --  136* 122*  BUN 14  --  13 16  CREATININE 0.76 0.71 0.67 0.61  CALCIUM 9.6  --  9.0 8.8   Liver Function Tests:  Recent Labs Lab 04/21/13 0615 04/22/13 0456  AST 16 15  ALT 14 13  ALKPHOS 77 66  BILITOT 0.3 <0.2*  PROT 6.0 5.6*  ALBUMIN 3.4* 3.2*   No results found for this basename: LIPASE, AMYLASE,  in the last 168 hours No results found for this basename: AMMONIA,  in the last 168 hours CBC:  Recent Labs Lab 04/20/13 1625 04/20/13 2153 04/21/13 0615  WBC 11.4* 10.0  4.5  NEUTROABS 7.4  --  3.9  HGB 15.3* 14.5 13.2  HCT 43.4 40.5 37.7  MCV 93.1 92.7 94.0  PLT 313 299 247   Cardiac Enzymes: No results found for this basename: CKTOTAL, CKMB, CKMBINDEX, TROPONINI,  in the last 168 hours BNP: BNP (last 3 results) No results found for this basename: PROBNP,  in the last 8760 hours CBG: No results found for this basename: GLUCAP,  in the last 168 hours     Signed:  Oneal Biglow A  [facility] 04/22/2013, 12:17 PM

## 2013-04-23 LAB — WOUND CULTURE

## 2013-04-25 ENCOUNTER — Emergency Department (HOSPITAL_COMMUNITY)
Admission: EM | Admit: 2013-04-25 | Discharge: 2013-04-25 | Disposition: A | Discharge status: Home / Self Care | Payer: Managed Care, Other (non HMO) | Attending: Emergency Medicine | Admitting: Emergency Medicine

## 2013-04-25 ENCOUNTER — Encounter (HOSPITAL_COMMUNITY): Payer: Self-pay | Admitting: Emergency Medicine

## 2013-04-25 DIAGNOSIS — R21 Rash and other nonspecific skin eruption: Secondary | ICD-10-CM | POA: Insufficient documentation

## 2013-04-25 DIAGNOSIS — Z792 Long term (current) use of antibiotics: Secondary | ICD-10-CM | POA: Insufficient documentation

## 2013-04-25 DIAGNOSIS — Z872 Personal history of diseases of the skin and subcutaneous tissue: Secondary | ICD-10-CM | POA: Insufficient documentation

## 2013-04-25 DIAGNOSIS — F17200 Nicotine dependence, unspecified, uncomplicated: Secondary | ICD-10-CM | POA: Insufficient documentation

## 2013-04-25 LAB — COMPREHENSIVE METABOLIC PANEL
ALBUMIN: 4.3 g/dL (ref 3.5–5.2)
ALK PHOS: 87 U/L (ref 39–117)
ALT: 40 U/L — AB (ref 0–35)
AST: 24 U/L (ref 0–37)
BUN: 15 mg/dL (ref 6–23)
CALCIUM: 9.7 mg/dL (ref 8.4–10.5)
CO2: 21 mEq/L (ref 19–32)
Chloride: 99 mEq/L (ref 96–112)
Creatinine, Ser: 0.9 mg/dL (ref 0.50–1.10)
GFR calc non Af Amer: 73 mL/min — ABNORMAL LOW (ref >90)
GFR, EST AFRICAN AMERICAN: 84 mL/min — AB (ref >90)
Glucose, Bld: 104 mg/dL — ABNORMAL HIGH (ref 70–99)
POTASSIUM: 4.3 meq/L (ref 3.7–5.3)
SODIUM: 138 meq/L (ref 137–147)
TOTAL PROTEIN: 7.4 g/dL (ref 6.0–8.3)
Total Bilirubin: 0.2 mg/dL — ABNORMAL LOW (ref 0.3–1.2)

## 2013-04-25 LAB — CBC WITH DIFFERENTIAL/PLATELET
BASOS ABS: 0 10*3/uL (ref 0.0–0.1)
BASOS PCT: 0 % (ref 0–1)
EOS ABS: 1.1 10*3/uL — AB (ref 0.0–0.7)
EOS PCT: 11 % — AB (ref 0–5)
HCT: 43.6 % (ref 36.0–46.0)
Hemoglobin: 15.2 g/dL — ABNORMAL HIGH (ref 12.0–15.0)
Lymphocytes Relative: 21 % (ref 12–46)
Lymphs Abs: 2.1 10*3/uL (ref 0.7–4.0)
MCH: 33 pg (ref 26.0–34.0)
MCHC: 34.9 g/dL (ref 30.0–36.0)
MCV: 94.8 fL (ref 78.0–100.0)
Monocytes Absolute: 1 10*3/uL (ref 0.1–1.0)
Monocytes Relative: 10 % (ref 3–12)
NEUTROS PCT: 58 % (ref 43–77)
Neutro Abs: 5.8 10*3/uL (ref 1.7–7.7)
PLATELETS: 303 10*3/uL (ref 150–400)
RBC: 4.6 MIL/uL (ref 3.87–5.11)
RDW: 13.1 % (ref 11.5–15.5)
WBC: 10.1 10*3/uL (ref 4.0–10.5)

## 2013-04-25 MED ORDER — PREDNISONE 20 MG PO TABS: 40.0000 mg | ORAL_TABLET | Freq: Every day | ORAL | Status: DC

## 2013-04-25 MED ORDER — PREDNISONE 20 MG PO TABS: 40.0000 mg | ORAL_TABLET | Freq: Once | ORAL | Status: DC

## 2013-04-25 MED ORDER — DEXAMETHASONE SODIUM PHOSPHATE 10 MG/ML IJ SOLN
10.0000 mg | Freq: Once | INTRAMUSCULAR | Status: AC
  Administered 2013-04-25: 10 mg via INTRAVENOUS
  Filled 2013-04-25: qty 1

## 2013-04-25 NOTE — ED Provider Notes (Signed)
CSN: [id number]     Arrival date & time 04/25/13  1147 History   First MD Initiated Contact with Patient 04/25/13 1302     Chief Complaint  Patient presents with  . Rash     (Consider location/radiation/quality/duration/timing/severity/associated sxs/prior Treatment) HPI Comments: Melanie Harris is a 53 y.o. female with a past medical history of Abscess of thigh presenting the Emergency Department with a chief complaint of worsening rash.  The patient reports worsening rash to right dorsum of the hand, stomach and lower back.  She describes the rash is pruritic in nature.  She reports partial relief of symptoms with prednisone and topical cortisone cream. Denies new lotions, soaps, detergents, travel, medications. Reports Curel lotion and dove fragrance free. States 1 shower per day.   Patient is a 53 y.o. female presenting with rash. The history is provided by the patient and medical records. No language interpreter was used.  Rash Associated symptoms: no fever and no shortness of breath     Past Medical History  Diagnosis Date  . Abscess of thigh     right   Past Surgical History  Procedure Laterality Date  . Abdominal hysterectomy  2004  . Tonsillectomy  10-12 yrs old   Family History  Problem Relation Age of Onset  . Lung cancer Father   . Heart failure Mother    History  Substance Use Topics  . Smoking status: Current Every Day Smoker -- 0.50 packs/day  . Smokeless tobacco: Not on file  . Alcohol Use: 0.6 oz/week    1 Glasses of wine per week   OB History   Grav Para Term Preterm Abortions TAB SAB Ect Mult Living                 Review of Systems  Constitutional: Negative for fever and chills.  Respiratory: Negative for cough and shortness of breath.   Skin: Positive for color change, rash and wound.  Allergic/Immunologic: Negative for environmental allergies, food allergies and immunocompromised state.      Allergies  Doxycycline  Home Medications    Current Outpatient Rx  Name  Route  Sig  Dispense  Refill  . amoxicillin (AMOXIL) 500 MG tablet   Oral   Take 1 tablet (500 mg total) by mouth 2 (two) times daily.   20 tablet   0   . diphenhydrAMINE (BENADRYL) 25 MG tablet   Oral   Take 50 mg by mouth every 6 (six) hours as needed for itching.         . hydrocortisone cream 0.5 %   Topical   Apply 1 application topically 2 (two) times daily as needed for itching.         Marland Kitchen ibuprofen (ADVIL,MOTRIN) 200 MG tablet   Oral   Take 400 mg by mouth every 6 (six) hours as needed (pain).          Marland Kitchen sulfamethoxazole-trimethoprim (BACTRIM DS) 800-160 MG per tablet   Oral   Take 1 tablet by mouth 2 (two) times daily.   20 tablet   0    BP 160/83  Pulse 94  Temp(Src) 98 F (36.7 C) (Oral)  Resp 18  SpO2 99% Physical Exam  Nursing note and vitals reviewed. Constitutional: She is oriented to person, place, and time. She appears well-developed and well-nourished. No distress.  HENT:  Head: Normocephalic and atraumatic.  Mouth/Throat: Uvula is midline and oropharynx is clear and moist.  No angioedema. Patent airway.   Eyes: EOM  are normal. Pupils are equal, round, and reactive to light. No scleral icterus.  Neck: Neck supple.  Cardiovascular: Normal rate, regular rhythm and normal heart sounds.   No murmur heard. Pulmonary/Chest: Effort normal and breath sounds normal. She has no wheezes.  Patient is able to speak in complete sentences.    Abdominal: Soft. Bowel sounds are normal. There is no tenderness. There is no rebound and no guarding.  Musculoskeletal: Normal range of motion. She exhibits no edema.  Neurological: She is alert and oriented to person, place, and time.  Skin: Skin is warm and dry. Rash noted. She is not diaphoretic.     Erythremic, Flaky with white scales to abdomen, lower back bilateral upper extremities, flexor surface of bilateral knees. #x3 area of induration without overlying erythremia with a  single linear incision. No purulent drainage with pressure.   Psychiatric: She has a normal mood and affect. Her behavior is normal.    ED Course  Procedures (including critical care time) Labs Review Labs Reviewed  CBC WITH DIFFERENTIAL - Abnormal; Notable for the following:    Hemoglobin 15.2 (*)    Eosinophils Relative 11 (*)    Eosinophils Absolute 1.1 (*)    All other components within normal limits  COMPREHENSIVE METABOLIC PANEL - Abnormal; Notable for the following:    Glucose, Bld 104 (*)    ALT 40 (*)    Total Bilirubin 0.2 (*)    GFR calc non Af Amer 73 (*)    GFR calc Af Amer 84 (*)    All other components within normal limits   Imaging Review No results found.   EKG Interpretation None      MDM   Final diagnoses:  Rash   Pt with history of rash ongoing for several months per EMR.  Rash appears to be systemic inflammatory in nature, questionable eczema, Lichen simplex, psoriasis. She reports increase in discomfort since last steroid treatment. Dr. Tamera Punt also evaluated the rash and advises IV decadron for relief of symptoms.  Advised her to follow up with a dermatologist for further evaluation of ongoing rash.  Prednisone with taper given.  Right abscess without signs of infection, repacked in the ED. Discussed lab results and treatment plan with the patient. Return precautions given. Reports understanding and no other concerns at this time.  Patient is stable for discharge at this time.     Meds given in ED:  Medications  dexamethasone (DECADRON) injection 10 mg (10 mg Intravenous Given 04/25/13 1452)    Discharge Medication List as of 04/25/2013  4:17 PM    START taking these medications   Details  predniSONE (DELTASONE) 20 MG tablet Take 2 tablets (40 mg total) by mouth daily. Take 40 mg by mouth daily for 3 days, then 20mg  by mouth daily for 3 days, then 10mg  daily for 3 days, Starting 04/25/2013, Until Discontinued, Print            Lorrine Kin,  PA-C 04/27/13 1256

## 2013-04-25 NOTE — Discharge Instructions (Signed)
Call for a follow up appointment with the [facility]. Call a dermatologist for further evaluation of your rash. Return if Symptoms worsen.   Take medication as prescribed.  Use Eucerin or Aquaphor for lotion, avoid over bathing or products that dry out your skin.

## 2013-04-25 NOTE — ED Notes (Signed)
Pt presents with increase in intensity to rash to bilateral upper arms, back, abd and right leg. Pt was discharged from this facility on Wednesday for cellulitis and rash. Pt states that rash improved while in the hospital and then returned on "Thursday night" pt describes the rash as " itchy and irritating" condition is acute in nature. Condition is made better by nothing. Condition is worse by nothing. Pt denies any problems with airway. Able to speak in full sentences at time of triage.

## 2013-04-27 NOTE — ED Provider Notes (Signed)
Medical screening examination/treatment/procedure(s) were performed by non-physician practitioner and as supervising physician I was immediately available for consultation/collaboration.   EKG Interpretation None        Malvin Johns, MD 04/27/13 1906

## 2013-05-04 ENCOUNTER — Encounter: Payer: Self-pay | Admitting: Internal Medicine

## 2013-05-04 ENCOUNTER — Ambulatory Visit: Payer: Managed Care, Other (non HMO) | Attending: Internal Medicine | Admitting: Internal Medicine

## 2013-05-04 VITALS — BP 159/87 | HR 94 | Temp 97.9°F | Resp 20 | Ht 67.0 in | Wt 103.2 lb

## 2013-05-04 DIAGNOSIS — I10 Essential (primary) hypertension: Secondary | ICD-10-CM | POA: Insufficient documentation

## 2013-05-04 DIAGNOSIS — L723 Sebaceous cyst: Secondary | ICD-10-CM | POA: Insufficient documentation

## 2013-05-04 MED ORDER — HYDROXYZINE HCL 10 MG PO TABS: 10.0000 mg | ORAL_TABLET | Freq: Three times a day (TID) | ORAL | Status: DC | PRN

## 2013-05-04 MED ORDER — CLONIDINE HCL 0.1 MG PO TABS
0.2000 mg | ORAL_TABLET | Freq: Once | ORAL | Status: AC
  Administered 2013-05-04: 0.2 mg via ORAL

## 2013-05-04 MED ORDER — HYDROCHLOROTHIAZIDE 25 MG PO TABS: 25.0000 mg | ORAL_TABLET | Freq: Every day | ORAL | Status: DC

## 2013-05-04 MED ORDER — NICOTINE 14 MG/24HR TD PT24: 14.0000 mg | MEDICATED_PATCH | Freq: Every day | TRANSDERMAL | Status: DC

## 2013-05-04 NOTE — Progress Notes (Signed)
Patient here to establish care.  At ED 2 weeks ago for right leg abscess. Completed antibiotics, steroid BP 188/92-not on medication. Clonidine 0.2 mg given. No headache, blurred vision, nausea/vomiting.

## 2013-05-04 NOTE — Progress Notes (Signed)
Patient ID: Melanie Harris, female   DOB: 04-12-1960, 53 y.o.   MRN: [id number]   CC:  HPI:  53 y.o. female with history of recurrent body rash and recurrent right posterior thigh abscess presenting with both. Pt presented to the [facility] with same on 04/20/13 and was admitted to medicine. Pt reported worsening diffuse erythematous scaley rash over the past week. She states this is the third episode in approx 2 years, but can think of no inciting factors. She reports having to have the abscess drained multiple times, this being the third but to the same spot.  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 The rash is diffuse maculopapular, somewhat improved after the prednisone. We will discharge. For the right thigh abscess the patient was treated with Bactrim Patient endorses allergy to doxycycline, she was also seen by general surgery in the hospital and was recommended surgical excision of the cyst of the thigh.  She has never had a colonoscopy or a mammogram. Status post total hysterectomy  Social history Patient is a smoker on Saturday, and denies drinking alcohol     Allergies  Allergen Reactions  . Doxycycline     Rash    Past Medical History  Diagnosis Date  . Abscess of thigh     right  . Hypertension    Current Outpatient Prescriptions on File Prior to Visit  Medication Sig Dispense Refill  . hydrocortisone cream 0.5 % Apply 1 application topically 2 (two) times daily as needed for itching.      Marland Kitchen amoxicillin (AMOXIL) 500 MG tablet Take 1 tablet (500 mg total) by mouth 2 (two) times daily.  20 tablet  0   No current facility-administered medications on file prior to visit.   Family History  Problem Relation Age of Onset  . Lung cancer Father   . Heart failure Mother    History   Social History  . Marital Status: Divorced    Spouse Name: N/A    Number of Children: N/A  . Years of Education: N/A   Occupational History  . Not on file.    Social History Main Topics  . Smoking status: Current Every Day Smoker -- 0.50 packs/day  . Smokeless tobacco: Not on file  . Alcohol Use: 0.6 oz/week    1 Glasses of wine per week  . Drug Use: No  . Sexual Activity: Not on file   Other Topics Concern  . Not on file   Social History Narrative  . No narrative on file    Review of Systems  Constitutional: Negative for fever, chills, diaphoresis, activity change, appetite change and fatigue.  HENT: Negative for ear pain, nosebleeds, congestion, facial swelling, rhinorrhea, neck pain, neck stiffness and ear discharge.   Eyes: Negative for pain, discharge, redness, itching and visual disturbance.  Respiratory: Negative for cough, choking, chest tightness, shortness of breath, wheezing and stridor.   Cardiovascular: Negative for chest pain, palpitations and leg swelling.  Gastrointestinal: Negative for abdominal distention.  Genitourinary: Negative for dysuria, urgency, frequency, hematuria, flank pain, decreased urine volume, difficulty urinating and dyspareunia.  Musculoskeletal: Negative for back pain, joint swelling, arthralgias and gait problem.  Neurological: Negative for dizziness, tremors, seizures, syncope, facial asymmetry, speech difficulty, weakness, light-headedness, numbness and headaches.  Hematological: Negative for adenopathy. Does not bruise/bleed easily.  Psychiatric/Behavioral: Negative for hallucinations, behavioral problems, confusion, dysphoric mood, decreased concentration and agitation.    Objective:   Filed Vitals:   05/04/13 1119  BP: 188/92  Pulse: 101  Temp: 97.9 F (36.6 C)  Resp: 20    Physical Exam  Constitutional: Appears well-developed and well-nourished. No distress.  HENT: Normocephalic. External right and left ear normal. Oropharynx is clear and moist.  Eyes: Conjunctivae and EOM are normal. PERRLA, no scleral icterus.  Neck: Normal ROM. Neck supple. No JVD. No tracheal deviation. No  thyromegaly.  CVS: RRR, S1/S2 +, no murmurs, no gallops, no carotid bruit.  Pulmonary: Effort and breath sounds normal, no stridor, rhonchi, wheezes, rales.  Abdominal: Soft. BS +,  no distension, tenderness, rebound or guarding.  Musculoskeletal: Right thigh cyst without any erythema and duration of fluctuance. No edema and no tenderness.  Lymphadenopathy: No lymphadenopathy noted, cervical, inguinal. Neuro: Alert. Normal reflexes, muscle tone coordination. No cranial nerve deficit. Skin: Diffuse maculopapular rash. No rash noted. Not diaphoretic. No erythema. No pallor.  Psychiatric: Normal mood and affect. Behavior, judgment, thought content normal.   Lab Results  Component Value Date   WBC 10.1 04/25/2013   HGB 15.2* 04/25/2013   HCT 43.6 04/25/2013   MCV 94.8 04/25/2013   PLT 303 04/25/2013   Lab Results  Component Value Date   CREATININE 0.90 04/25/2013   BUN 15 04/25/2013   NA 138 04/25/2013   K 4.3 04/25/2013   CL 99 04/25/2013   CO2 21 04/25/2013    Lab Results  Component Value Date   HGBA1C 5.3 04/20/2013   Lipid Panel     Component Value Date/Time   CHOL 151 04/20/2013 2153   TRIG 75 04/20/2013 2153   HDL 65 04/20/2013 2153   CHOLHDL 2.3 04/20/2013 2153   VLDL 15 04/20/2013 2153   LDLCALC 71 04/20/2013 2153       Assessment and plan:   Patient Active Problem List   Diagnosis Date Noted  . Rash 08/21/2011  . Abscess of thigh   . Abscess of right thigh 07/25/2011    uncontrolled hypertension Blood pressure greater than 180 received 0.2 mg of clonidine Started on hydrochlorothiazide 25 mg We'll repeat labs a month from now to ensure that the electrolytes are stable RN visit in 2-3 weeks for blood pressure check    Cyst posterior right thigh Likely sebaceous Surgical referral for possible excision because of recurrent infection X.    Establish care No reason to do a Pap smear because of status post hysterectomy Scheduled the patient for a mammogram No labs to  be done today because of recent labs in the hospital GI referral for colonoscopy Tetanus 2013 Flu vaccine 2014 Patient to followup in 3 months      The patient was given clear instructions to go to ER or return to medical center if symptoms don't improve, worsen or new problems develop. The patient verbalized understanding. The patient was told to call to get any lab results if not heard anything in the next week.

## 2013-05-19 ENCOUNTER — Ambulatory Visit: Payer: Managed Care, Other (non HMO) | Attending: Internal Medicine

## 2013-06-04 ENCOUNTER — Encounter: Payer: Self-pay | Admitting: Internal Medicine

## 2013-07-02 ENCOUNTER — Ambulatory Visit: Payer: Self-pay | Attending: Internal Medicine | Admitting: *Deleted

## 2013-07-02 VITALS — BP 175/90 | HR 92 | Resp 14

## 2013-07-02 DIAGNOSIS — I10 Essential (primary) hypertension: Secondary | ICD-10-CM

## 2013-07-02 MED ORDER — LISINOPRIL 10 MG PO TABS: 10.0000 mg | ORAL_TABLET | Freq: Every day | ORAL | Status: DC

## 2013-07-02 MED ORDER — HYDROCHLOROTHIAZIDE 25 MG PO TABS: 25.0000 mg | ORAL_TABLET | Freq: Every day | ORAL | Status: DC

## 2013-07-02 NOTE — Patient Instructions (Signed)

## 2013-07-02 NOTE — Progress Notes (Signed)
Patient in today for BP check. Patient states she has been running in the 170's for the last three weeks. Patient states she has had some headaches and dizziness for the last couple days. Consulted with Roney Jaffe, NP who has added Lisinopril 10 mg. Patient to come back in one week for BP check. Alverda Skeans, RN

## 2013-07-09 ENCOUNTER — Ambulatory Visit: Payer: Self-pay | Attending: Internal Medicine | Admitting: *Deleted

## 2013-07-09 VITALS — BP 150/82 | HR 88 | Resp 16

## 2013-07-09 DIAGNOSIS — I10 Essential (primary) hypertension: Secondary | ICD-10-CM

## 2013-07-09 NOTE — Progress Notes (Signed)
Patient here for BP check. Patient states she is still dizzy only when walking. Patient also complains of being tired. Consulted with Dr. Doreene Burke who recommends hydration and patient to take her time when going from sitting to standing, and lying to sitting.

## 2013-07-09 NOTE — Patient Instructions (Signed)
Keep hydrated. Take your time when going from different positions. If dizziness gets worse return to the clinic.  Dizziness Dizziness is a common problem. It is a feeling of unsteadiness or lightheadedness. You may feel like you are about to faint. Dizziness can lead to injury if you stumble or fall. A person of any age group can suffer from dizziness, but dizziness is more common in older adults. CAUSES  Dizziness can be caused by many different things, including:  Middle ear problems.  Standing for too long.  Infections.  An allergic reaction.  Aging.  An emotional response to something, such as the sight of blood.  Side effects of medicines.  Fatigue.  Problems with circulation or blood pressure.  Excess use of alcohol, medicines, or illegal drug use.  Breathing too fast (hyperventilation).  An arrhythmia or problems with your heart rhythm.  Low red blood cell count (anemia).  Pregnancy.  Vomiting, diarrhea, fever, or other illnesses that cause dehydration.  Diseases or conditions such as Parkinson's disease, high blood pressure (hypertension), diabetes, and thyroid problems.  Exposure to extreme heat. DIAGNOSIS  To find the cause of your dizziness, your caregiver may do a physical exam, lab tests, radiologic imaging scans, or an electrocardiography test (ECG).  TREATMENT  Treatment of dizziness depends on the cause of your symptoms and can vary greatly. HOME CARE INSTRUCTIONS   Drink enough fluids to keep your urine clear or pale yellow. This is especially important in very hot weather. In the elderly, it is also important in cold weather.  If your dizziness is caused by medicines, take them exactly as directed. When taking blood pressure medicines, it is especially important to get up slowly.  Rise slowly from chairs and steady yourself until you feel okay.  In the morning, first sit up on the side of the bed. When this seems okay, stand slowly while holding  onto something until you know your balance is fine.  If you need to stand in one place for a long time, be sure to move your legs often. Tighten and relax the muscles in your legs while standing.  If dizziness continues to be a problem, have someone stay with you for a day or two. Do this until you feel you are well enough to stay alone. Have the person call your caregiver if he or she notices changes in you that are concerning.  Do not drive or use heavy machinery if you feel dizzy.  Do not drink alcohol. SEEK IMMEDIATE MEDICAL CARE IF:   Your dizziness or lightheadedness gets worse.  You feel nauseous or vomit.  You develop problems with talking, walking, weakness, or using your arms, hands, or legs.  You are not thinking clearly or you have difficulty forming sentences. It may take a friend or family member to determine if your thinking is normal.  You develop chest pain, abdominal pain, shortness of breath, or sweating.  Your vision changes.  You notice any bleeding.  You have side effects from medicine that seems to be getting worse rather than better. MAKE SURE YOU:   Understand these instructions.  Will watch your condition.  Will get help right away if you are not doing well or get worse. Document Released: 07/18/2000 Document Revised: 04/16/2011 Document Reviewed: 08/11/2010 [facility] Patient Information 2014 [place], [place].

## 2013-08-04 ENCOUNTER — Encounter: Payer: Self-pay | Admitting: Internal Medicine

## 2013-08-04 ENCOUNTER — Ambulatory Visit: Payer: Self-pay | Attending: Internal Medicine | Admitting: Internal Medicine

## 2013-08-04 VITALS — BP 160/90 | HR 100 | Temp 98.7°F | Resp 16 | Wt 106.0 lb

## 2013-08-04 DIAGNOSIS — Z1211 Encounter for screening for malignant neoplasm of colon: Secondary | ICD-10-CM

## 2013-08-04 DIAGNOSIS — Z139 Encounter for screening, unspecified: Secondary | ICD-10-CM

## 2013-08-04 DIAGNOSIS — I10 Essential (primary) hypertension: Secondary | ICD-10-CM | POA: Insufficient documentation

## 2013-08-04 DIAGNOSIS — F17200 Nicotine dependence, unspecified, uncomplicated: Secondary | ICD-10-CM | POA: Insufficient documentation

## 2013-08-04 MED ORDER — VARENICLINE TARTRATE 0.5 MG X 11 & 1 MG X 42 PO MISC: ORAL | Status: DC

## 2013-08-04 MED ORDER — LISINOPRIL 20 MG PO TABS: 20.0000 mg | ORAL_TABLET | Freq: Every day | ORAL | Status: DC

## 2013-08-04 NOTE — Progress Notes (Signed)
Patient states here for follow up on her hypertension Concerned her blood pressure is still high

## 2013-08-04 NOTE — Progress Notes (Signed)
MRN: [id number] Name: Melanie Harris  Sex: female Age: 53 y.o. DOB: [birth year]  Allergies: Doxycycline  Chief Complaint  Patient presents with  . Follow-up    HTN    HPI: Patient is 53 y.o. female who has to of hypertension comes today for followup, she's currently taking hydrochlorothiazide 25 mg, she was given lisinopril 10 mg last month as per patient she ran out for the last 3-4 days, today her blood pressure is elevated, repeat manual blood pressure is 160/90 denies any headache dizziness chest and shortness of breath as per patient urine when she was taking lisinopril 10 mg her blood was still elevated, she reported to have family history of hypertension, she also smokes cigarettes, as per patient she tried nicotine patch which did not help her, discussed about Chantix she is agreeable to try and denies any history of anxiety or depression.   Past Medical History  Diagnosis Date  . Abscess of thigh     right  . Hypertension     Past Surgical History  Procedure Laterality Date  . Abdominal hysterectomy  2004  . Tonsillectomy  10-11 yrs old      Medication List       This list is accurate as of: 08/04/13  4:25 PM.  Always use your most recent med list.               amoxicillin 500 MG tablet  Commonly known as:  AMOXIL  Take 1 tablet (500 mg total) by mouth 2 (two) times daily.     hydrochlorothiazide 25 MG tablet  Commonly known as:  HYDRODIURIL  Take 1 tablet (25 mg total) by mouth daily.     hydrocortisone cream 0.5 %  Apply 1 application topically 2 (two) times daily as needed for itching.     hydrOXYzine 10 MG tablet  Commonly known as:  ATARAX/VISTARIL  Take 1-2 tablets (10-20 mg total) by mouth every 8 (eight) hours as needed for itching.     lisinopril 20 MG tablet  Commonly known as:  PRINIVIL,ZESTRIL  Take 1 tablet (20 mg total) by mouth daily.     nicotine 14 mg/24hr patch  Commonly known as:  EQ NICOTINE  Place 1 patch (14 mg total)  onto the skin daily.     varenicline 0.5 MG X 11 & 1 MG X 42 tablet  Commonly known as:  CHANTIX PAK  Take one 0.5 mg tablet by mouth once daily for 3 days, then increase to one 0.5 mg tablet twice daily for 4 days, then increase to one 1 mg tablet twice daily.        Meds ordered this encounter  Medications  . varenicline (CHANTIX PAK) 0.5 MG X 11 & 1 MG X 42 tablet    Sig: Take one 0.5 mg tablet by mouth once daily for 3 days, then increase to one 0.5 mg tablet twice daily for 4 days, then increase to one 1 mg tablet twice daily.    Dispense:  53 tablet    Refill:  0  . lisinopril (PRINIVIL,ZESTRIL) 20 MG tablet    Sig: Take 1 tablet (20 mg total) by mouth daily.    Dispense:  30 tablet    Refill:  3    Immunization History  Administered Date(s) Administered  . Influenza,inj,Quad PF,36+ Mos 04/22/2013  . Pneumococcal Polysaccharide-23 04/22/2013    Family History  Problem Relation Age of Onset  . Lung cancer Father   . Heart  failure Mother     History  Substance Use Topics  . Smoking status: Current Every Day Smoker -- 0.50 packs/day  . Smokeless tobacco: Not on file  . Alcohol Use: 0.6 oz/week    1 Glasses of wine per week    Review of Systems   As noted in HPI  Filed Vitals:   08/04/13 1624  BP: 160/90  Pulse:   Temp:   Resp:     Physical Exam  Physical Exam  Constitutional: No distress.  Eyes: EOM are normal. Pupils are equal, round, and reactive to light.  Neck: Neck supple.  Cardiovascular: Normal rate and regular rhythm.   Pulmonary/Chest: Breath sounds normal. No respiratory distress. She has no wheezes. She has no rales.  Musculoskeletal: She exhibits no edema.    CBC    Component Value Date/Time   WBC 10.1 04/25/2013 1201   RBC 4.60 04/25/2013 1201   HGB 15.2* 04/25/2013 1201   HCT 43.6 04/25/2013 1201   PLT 303 04/25/2013 1201   MCV 94.8 04/25/2013 1201   LYMPHSABS 2.1 04/25/2013 1201   MONOABS 1.0 04/25/2013 1201   EOSABS 1.1* 04/25/2013  1201   BASOSABS 0.0 04/25/2013 1201    CMP     Component Value Date/Time   NA 138 04/25/2013 1201   K 4.3 04/25/2013 1201   CL 99 04/25/2013 1201   CO2 21 04/25/2013 1201   GLUCOSE 104* 04/25/2013 1201   BUN 15 04/25/2013 1201   CREATININE 0.90 04/25/2013 1201   CALCIUM 9.7 04/25/2013 1201   PROT 7.4 04/25/2013 1201   ALBUMIN 4.3 04/25/2013 1201   AST 24 04/25/2013 1201   ALT 40* 04/25/2013 1201   ALKPHOS 87 04/25/2013 1201   BILITOT 0.2* 04/25/2013 1201   GFRNONAA 73* 04/25/2013 1201   GFRAA 84* 04/25/2013 1201    Lab Results  Component Value Date/Time   CHOL 151 04/20/2013  9:53 PM    No components found with this basename: hga1c    Lab Results  Component Value Date/Time   AST 24 04/25/2013 12:01 PM    Assessment and Plan  Unspecified essential hypertension - Plan: Continue with hydrochlorothiazide 25 mg, I have increased the dose of lisinopril to 20 mg.  lisinopril (PRINIVIL,ZESTRIL) 20 MG tablet  Smoking - Plan: Patient will try Chantix, she denies any history of anxiety or depression.  varenicline (CHANTIX PAK) 0.5 MG X 11 & 1 MG X 42 tablet  Special screening for malignant neoplasms, colon - Plan: Ambulatory referral to Gastroenterology  Screening - Plan: MM DIGITAL SCREENING BILATERAL,    Health Maintenance -Colonoscopy: referred to GI  -Mammogram: ordered   Return in about 3 months (around 11/04/2013) for hypertension, BP check in 2 weeks/Nurse Visit.  Lorayne Marek, MD

## 2013-08-04 NOTE — Patient Instructions (Signed)
DASH Eating Plan  DASH stands for "Dietary Approaches to Stop Hypertension." The DASH eating plan is a healthy eating plan that has been shown to reduce high blood pressure (hypertension). Additional health benefits may include reducing the risk of type 2 diabetes mellitus, heart disease, and stroke. The DASH eating plan may also help with weight loss.  WHAT DO I NEED TO KNOW ABOUT THE DASH EATING PLAN?  For the DASH eating plan, you will follow these general guidelines:  · Choose foods with a percent daily value for sodium of less than 5% (as listed on the food label).  · Use salt-free seasonings or herbs instead of table salt or sea salt.  · Check with your health care provider or pharmacist before using salt substitutes.  · Eat lower-sodium products, often labeled as "lower sodium" or "no salt added."  · Eat fresh foods.  · Eat more vegetables, fruits, and low-fat dairy products.  · Choose whole grains. Look for the word "whole" as the first word in the ingredient list.  · Choose fish and skinless chicken or turkey more often than red meat. Limit fish, poultry, and meat to 6 oz (170 g) each day.  · Limit sweets, desserts, sugars, and sugary drinks.  · Choose heart-healthy fats.  · Limit cheese to 1 oz (28 g) per day.  · Eat more home-cooked food and less restaurant, buffet, and fast food.  · Limit fried foods.  · Cook foods using methods other than frying.  · Limit canned vegetables. If you do use them, rinse them well to decrease the sodium.  · When eating at a restaurant, ask that your food be prepared with less salt, or no salt if possible.  WHAT FOODS CAN I EAT?  Seek help from a dietitian for individual calorie needs.  Grains  Whole grain or whole wheat bread. Brown rice. Whole grain or whole wheat pasta. Quinoa, bulgur, and whole grain cereals. Low-sodium cereals. Corn or whole wheat flour tortillas. Whole grain cornbread. Whole grain crackers. Low-sodium crackers.  Vegetables  Fresh or frozen vegetables  (raw, steamed, roasted, or grilled). Low-sodium or reduced-sodium tomato and vegetable juices. Low-sodium or reduced-sodium tomato sauce and paste. Low-sodium or reduced-sodium canned vegetables.   Fruits  All fresh, canned (in natural juice), or frozen fruits.  Meat and Other Protein Products  Ground beef (85% or leaner), grass-fed beef, or beef trimmed of fat. Skinless chicken or turkey. Ground chicken or turkey. Pork trimmed of fat. All fish and seafood. Eggs. Dried beans, peas, or lentils. Unsalted nuts and seeds. Unsalted canned beans.  Dairy  Low-fat dairy products, such as skim or 1% milk, 2% or reduced-fat cheeses, low-fat ricotta or cottage cheese, or plain low-fat yogurt. Low-sodium or reduced-sodium cheeses.  Fats and Oils  Tub margarines without trans fats. Light or reduced-fat mayonnaise and salad dressings (reduced sodium). Avocado. Safflower, olive, or canola oils. Natural peanut or almond butter.  Other  Unsalted popcorn and pretzels.  The items listed above may not be a complete list of recommended foods or beverages. Contact your dietitian for more options.  WHAT FOODS ARE NOT RECOMMENDED?  Grains  White bread. White pasta. White rice. Refined cornbread. Bagels and croissants. Crackers that contain trans fat.  Vegetables  Creamed or fried vegetables. Vegetables in a cheese sauce. Regular canned vegetables. Regular canned tomato sauce and paste. Regular tomato and vegetable juices.  Fruits  Dried fruits. Canned fruit in light or heavy syrup. Fruit juice.  Meat and Other Protein   Products  Fatty cuts of meat. Ribs, chicken wings, bacon, sausage, bologna, salami, chitterlings, fatback, hot dogs, bratwurst, and packaged luncheon meats. Salted nuts and seeds. Canned beans with salt.  Dairy  Whole or 2% milk, cream, half-and-half, and cream cheese. Whole-fat or sweetened yogurt. Full-fat cheeses or blue cheese. Nondairy creamers and whipped toppings. Processed cheese, cheese spreads, or cheese  curds.  Condiments  Onion and garlic salt, seasoned salt, table salt, and sea salt. Canned and packaged gravies. Worcestershire sauce. Tartar sauce. Barbecue sauce. Teriyaki sauce. Soy sauce, including reduced sodium. Steak sauce. Fish sauce. Oyster sauce. Cocktail sauce. Horseradish. Ketchup and mustard. Meat flavorings and tenderizers. Bouillon cubes. Hot sauce. Tabasco sauce. Marinades. Taco seasonings. Relishes.  Fats and Oils  Butter, stick margarine, lard, shortening, ghee, and bacon fat. Coconut, palm kernel, or palm oils. Regular salad dressings.  Other  Pickles and olives. Salted popcorn and pretzels.  The items listed above may not be a complete list of foods and beverages to avoid. Contact your dietitian for more information.  WHERE CAN I FIND MORE INFORMATION?  National Heart, [employer], and [employer] Institute: www.nhlbi.nih.gov/health/health-topics/topics/dash/  Document Released: 01/11/2011 Document Revised: 01/27/2013 Document Reviewed: 11/26/2012  ExitCare® Patient Information ©2015 [employer]. This information is not intended to replace advice given to you by your health care provider. Make sure you discuss any questions you have with your health care provider.

## 2013-08-10 ENCOUNTER — Encounter (HOSPITAL_COMMUNITY): Payer: Self-pay | Admitting: Emergency Medicine

## 2013-08-10 ENCOUNTER — Emergency Department (INDEPENDENT_AMBULATORY_CARE_PROVIDER_SITE_OTHER): Payer: Self-pay

## 2013-08-10 ENCOUNTER — Emergency Department (INDEPENDENT_AMBULATORY_CARE_PROVIDER_SITE_OTHER)
Admission: EM | Admit: 2013-08-10 | Discharge: 2013-08-10 | Disposition: A | Discharge status: Home / Self Care | Payer: Self-pay | Source: Home / Self Care | Attending: Family Medicine | Admitting: Family Medicine

## 2013-08-10 DIAGNOSIS — J209 Acute bronchitis, unspecified: Secondary | ICD-10-CM

## 2013-08-10 DIAGNOSIS — J440 Chronic obstructive pulmonary disease with acute lower respiratory infection: Secondary | ICD-10-CM

## 2013-08-10 MED ORDER — MOXIFLOXACIN HCL 400 MG PO TABS: 400.0000 mg | ORAL_TABLET | Freq: Every day | ORAL | Status: DC

## 2013-08-10 NOTE — ED Notes (Signed)
Pt  Reports  Symptoms  Of  Cough  /  Fever          Congested    With  Symptoms              X    2  Days  Pt  Is  A  Smoker

## 2013-08-10 NOTE — ED Notes (Signed)
[facility] Pharmacist called States they don't have Avelox but they do have Levaquin Per Dr. Juventino Slovak... Ok to to change to Levaquin 500mg  1 tab PO x10 days.

## 2013-08-10 NOTE — Discharge Instructions (Signed)
Take all of medicine, drink lots of fluids, no more smoking, mucinex daily see your doctor if further problems

## 2013-08-10 NOTE — ED Provider Notes (Signed)
CSN: [id number]     Arrival date & time 08/10/13  1306 History   First MD Initiated Contact with Patient 08/10/13 1505     Chief Complaint  Patient presents with  . Cough   (Consider location/radiation/quality/duration/timing/severity/associated sxs/prior Treatment) Patient is a 53 y.o. female presenting with cough. The history is provided by the patient.  Cough Cough characteristics:  Productive and harsh Sputum characteristics:  Yellow Severity:  Moderate Onset quality:  Gradual Duration:  3 days Progression:  Unchanged Chronicity:  New Smoker: yes   Ineffective treatments:  None tried Associated symptoms: fever   Associated symptoms: no rash, no rhinorrhea, no shortness of breath, no sore throat and no wheezing   Associated symptoms comment:  Tmax 102    Past Medical History  Diagnosis Date  . Abscess of thigh     right  . Hypertension    Past Surgical History  Procedure Laterality Date  . Abdominal hysterectomy  2004  . Tonsillectomy  10-12 yrs old   Family History  Problem Relation Age of Onset  . Lung cancer Father   . Heart failure Mother    History  Substance Use Topics  . Smoking status: Current Every Day Smoker -- 0.50 packs/day  . Smokeless tobacco: Not on file  . Alcohol Use: 0.6 oz/week    1 Glasses of wine per week   OB History   Grav Para Term Preterm Abortions TAB SAB Ect Mult Living                 Review of Systems  Constitutional: Positive for fever.  HENT: Negative.  Negative for rhinorrhea and sore throat.   Respiratory: Positive for cough. Negative for shortness of breath and wheezing.   Cardiovascular: Negative.   Gastrointestinal: Negative for nausea and vomiting.  Skin: Negative.  Negative for rash.    Allergies  Doxycycline  Home Medications   Prior to Admission medications   Medication Sig Start Date End Date Taking? Authorizing Provider  amoxicillin (AMOXIL) 500 MG tablet Take 1 tablet (500 mg total) by mouth 2 (two) times  daily. 04/22/13   Verlee Monte, MD  hydrochlorothiazide (HYDRODIURIL) 25 MG tablet Take 1 tablet (25 mg total) by mouth daily. 07/02/13   Lance Bosch, NP  hydrocortisone cream 0.5 % Apply 1 application topically 2 (two) times daily as needed for itching.    Historical Provider, MD  hydrOXYzine (ATARAX/VISTARIL) 10 MG tablet Take 1-2 tablets (10-20 mg total) by mouth every 8 (eight) hours as needed for itching. 05/04/13   Reyne Dumas, MD  lisinopril (PRINIVIL,ZESTRIL) 20 MG tablet Take 1 tablet (20 mg total) by mouth daily. 08/04/13   Lorayne Marek, MD  moxifloxacin (AVELOX) 400 MG tablet Take 1 tablet (400 mg total) by mouth daily. 08/10/13   Billy Fischer, MD  nicotine (EQ NICOTINE) 14 mg/24hr patch Place 1 patch (14 mg total) onto the skin daily. 05/04/13   Reyne Dumas, MD  varenicline (CHANTIX PAK) 0.5 MG X 11 & 1 MG X 42 tablet Take one 0.5 mg tablet by mouth once daily for 3 days, then increase to one 0.5 mg tablet twice daily for 4 days, then increase to one 1 mg tablet twice daily. 08/04/13   Lorayne Marek, MD   BP 135/78  Pulse 93  Temp(Src) 99 F (37.2 C) (Oral)  Resp 14  SpO2 98% Physical Exam  Nursing note and vitals reviewed. Constitutional: She is oriented to person, place, and time. She appears well-developed and well-nourished.  HENT:  Head: Normocephalic.  Right Ear: External ear normal.  Left Ear: External ear normal.  Mouth/Throat: Oropharynx is clear and moist.  Neck: Normal range of motion. Neck supple.  Cardiovascular: Regular rhythm, normal heart sounds and intact distal pulses.   Pulmonary/Chest: Effort normal and breath sounds normal. She has no rales.  Lymphadenopathy:    She has no cervical adenopathy.  Neurological: She is alert and oriented to person, place, and time.  Skin: Skin is warm and dry.    ED Course  Procedures (including critical care time) Labs Review Labs Reviewed - No data to display  Imaging Review Dg Chest 2 View  08/10/2013   CLINICAL  DATA:  Cough.  Smoker.  EXAM: CHEST  2 VIEW  COMPARISON:  None.  FINDINGS: The chest is markedly hyperexpanded with attenuation of the pulmonary vasculature. No consolidative process, pneumothorax or effusion is identified. Prominent nipple shadows are noted. Remote appearing left fifth rib fracture is noted.  IMPRESSION: Emphysema without acute disease.   Electronically Signed   By: Inge Rise M.D.   On: 08/10/2013 15:42   X-rays reviewed and report per radiologist.   MDM   1. Emphysema with acute on chronic bronchitis       Billy Fischer, MD 08/10/13 [phone]

## 2013-08-12 ENCOUNTER — Ambulatory Visit: Payer: Self-pay | Attending: Internal Medicine | Admitting: Internal Medicine

## 2013-08-12 ENCOUNTER — Encounter: Payer: Self-pay | Admitting: Internal Medicine

## 2013-08-12 ENCOUNTER — Telehealth: Payer: Self-pay | Admitting: Internal Medicine

## 2013-08-12 VITALS — BP 118/70 | HR 84 | Temp 97.5°F | Resp 16 | Wt 101.8 lb

## 2013-08-12 DIAGNOSIS — R059 Cough, unspecified: Secondary | ICD-10-CM

## 2013-08-12 DIAGNOSIS — Z79899 Other long term (current) drug therapy: Secondary | ICD-10-CM | POA: Insufficient documentation

## 2013-08-12 DIAGNOSIS — J40 Bronchitis, not specified as acute or chronic: Secondary | ICD-10-CM | POA: Insufficient documentation

## 2013-08-12 DIAGNOSIS — R05 Cough: Secondary | ICD-10-CM

## 2013-08-12 DIAGNOSIS — F17200 Nicotine dependence, unspecified, uncomplicated: Secondary | ICD-10-CM | POA: Insufficient documentation

## 2013-08-12 DIAGNOSIS — J438 Other emphysema: Secondary | ICD-10-CM

## 2013-08-12 DIAGNOSIS — I10 Essential (primary) hypertension: Secondary | ICD-10-CM | POA: Insufficient documentation

## 2013-08-12 DIAGNOSIS — IMO0001 Reserved for inherently not codable concepts without codable children: Secondary | ICD-10-CM | POA: Insufficient documentation

## 2013-08-12 HISTORY — DX: Nicotine dependence, unspecified, uncomplicated: F17.200

## 2013-08-12 HISTORY — DX: Reserved for inherently not codable concepts without codable children: IMO0001

## 2013-08-12 HISTORY — DX: Cough, unspecified: R05.9

## 2013-08-12 HISTORY — DX: Other emphysema: J43.8

## 2013-08-12 LAB — COMPLETE METABOLIC PANEL WITH GFR
ALK PHOS: 58 U/L (ref 39–117)
ALT: 19 U/L (ref 0–35)
AST: 20 U/L (ref 0–37)
Albumin: 4.6 g/dL (ref 3.5–5.2)
BUN: 33 mg/dL — AB (ref 6–23)
CO2: 26 mEq/L (ref 19–32)
Calcium: 9.8 mg/dL (ref 8.4–10.5)
Chloride: 95 mEq/L — ABNORMAL LOW (ref 96–112)
Creat: 1.4 mg/dL — ABNORMAL HIGH (ref 0.50–1.10)
GFR, EST NON AFRICAN AMERICAN: 44 mL/min — AB
GFR, Est African American: 50 mL/min — ABNORMAL LOW
GLUCOSE: 100 mg/dL — AB (ref 70–99)
Potassium: 3.7 mEq/L (ref 3.5–5.3)
Sodium: 133 mEq/L — ABNORMAL LOW (ref 135–145)
Total Bilirubin: 0.4 mg/dL (ref 0.2–1.2)
Total Protein: 7.2 g/dL (ref 6.0–8.3)

## 2013-08-12 MED ORDER — BENZONATATE 100 MG PO CAPS: 100.0000 mg | ORAL_CAPSULE | Freq: Three times a day (TID) | ORAL | Status: DC | PRN

## 2013-08-12 MED ORDER — ALBUTEROL SULFATE HFA 108 (90 BASE) MCG/ACT IN AERS: 2.0000 | INHALATION_SPRAY | Freq: Four times a day (QID) | RESPIRATORY_TRACT | Status: DC | PRN

## 2013-08-12 NOTE — Progress Notes (Signed)
Patient states was seen Monday(2 days ago) at the Urgent care And was diagnosed with bronchitis Complains of coughing , dizzy and feels weak Not feeling any better

## 2013-08-12 NOTE — Progress Notes (Signed)
MRN: [id number] Name: Melanie Harris  Sex: female Age: 53 y.o. DOB: [birth year]  Allergies: Doxycycline  Chief Complaint  Patient presents with  . Follow-up    HPI: Patient is 53 y.o. female who comes today for followup, today liver she went to the urgent care with the symptoms of bronchitis, she has been started on Avelox, she currently denies any fever but her still has some cough which is nonproductive, EMR reviewed she had x-ray done which reported emphysema, patient has already been prescribed Chantix to quit smoking as per patient for the last 2 days she has not smoked, occasionally feels weak tired questionable secondary to coughing denies any chest pain, today her vitals are stable her oxygen saturation is 98%.  Past Medical History  Diagnosis Date  . Abscess of thigh     right  . Hypertension     Past Surgical History  Procedure Laterality Date  . Abdominal hysterectomy  2004  . Tonsillectomy  10-12 yrs old      Medication List       This list is accurate as of: 08/12/13  3:40 PM.  Always use your most recent med list.               albuterol 108 (90 BASE) MCG/ACT inhaler  Commonly known as:  PROVENTIL HFA;VENTOLIN HFA  Inhale 2 puffs into the lungs every 6 (six) hours as needed for wheezing or shortness of breath.     amoxicillin 500 MG tablet  Commonly known as:  AMOXIL  Take 1 tablet (500 mg total) by mouth 2 (two) times daily.     benzonatate 100 MG capsule  Commonly known as:  TESSALON  Take 1 capsule (100 mg total) by mouth 3 (three) times daily as needed for cough.     hydrochlorothiazide 25 MG tablet  Commonly known as:  HYDRODIURIL  Take 1 tablet (25 mg total) by mouth daily.     hydrocortisone cream 0.5 %  Apply 1 application topically 2 (two) times daily as needed for itching.     hydrOXYzine 10 MG tablet  Commonly known as:  ATARAX/VISTARIL  Take 1-2 tablets (10-20 mg total) by mouth every 8 (eight) hours as needed for itching.     levofloxacin 500 MG tablet  Commonly known as:  LEVAQUIN  Take 500 mg by mouth daily. Prescribed daily for ten days     lisinopril 20 MG tablet  Commonly known as:  PRINIVIL,ZESTRIL  Take 1 tablet (20 mg total) by mouth daily.     moxifloxacin 400 MG tablet  Commonly known as:  AVELOX  Take 1 tablet (400 mg total) by mouth daily.     nicotine 14 mg/24hr patch  Commonly known as:  EQ NICOTINE  Place 1 patch (14 mg total) onto the skin daily.     varenicline 0.5 MG X 11 & 1 MG X 42 tablet  Commonly known as:  CHANTIX PAK  Take one 0.5 mg tablet by mouth once daily for 3 days, then increase to one 0.5 mg tablet twice daily for 4 days, then increase to one 1 mg tablet twice daily.        Meds ordered this encounter  Medications  . levofloxacin (LEVAQUIN) 500 MG tablet    Sig: Take 500 mg by mouth daily. Prescribed daily for ten days  . albuterol (PROVENTIL HFA;VENTOLIN HFA) 108 (90 BASE) MCG/ACT inhaler    Sig: Inhale 2 puffs into the lungs every 6 (six) hours  as needed for wheezing or shortness of breath.    Dispense:  1 Inhaler    Refill:  0  . benzonatate (TESSALON) 100 MG capsule    Sig: Take 1 capsule (100 mg total) by mouth 3 (three) times daily as needed for cough.    Dispense:  30 capsule    Refill:  1    Immunization History  Administered Date(s) Administered  . Influenza,inj,Quad PF,36+ Mos 04/22/2013  . Pneumococcal Polysaccharide-23 04/22/2013    Family History  Problem Relation Age of Onset  . Lung cancer Father   . Heart failure Mother     History  Substance Use Topics  . Smoking status: Current Every Day Smoker -- 0.50 packs/day  . Smokeless tobacco: Not on file  . Alcohol Use: 0.6 oz/week    1 Glasses of wine per week    Review of Systems   As noted in HPI  Filed Vitals:   08/12/13 1518  BP: 118/70  Pulse: 84  Temp: 97.5 F (36.4 C)  Resp: 16    Physical Exam  Physical Exam  Constitutional: No distress.  Eyes: EOM are normal.  Pupils are equal, round, and reactive to light.  Cardiovascular: Normal rate and regular rhythm.   Pulmonary/Chest: She has no rales.  Minimal wheezing  Musculoskeletal: She exhibits no edema.    CBC    Component Value Date/Time   WBC 10.1 04/25/2013 1201   RBC 4.60 04/25/2013 1201   HGB 15.2* 04/25/2013 1201   HCT 43.6 04/25/2013 1201   PLT 303 04/25/2013 1201   MCV 94.8 04/25/2013 1201   LYMPHSABS 2.1 04/25/2013 1201   MONOABS 1.0 04/25/2013 1201   EOSABS 1.1* 04/25/2013 1201   BASOSABS 0.0 04/25/2013 1201    CMP     Component Value Date/Time   NA 138 04/25/2013 1201   K 4.3 04/25/2013 1201   CL 99 04/25/2013 1201   CO2 21 04/25/2013 1201   GLUCOSE 104* 04/25/2013 1201   BUN 15 04/25/2013 1201   CREATININE 0.90 04/25/2013 1201   CALCIUM 9.7 04/25/2013 1201   PROT 7.4 04/25/2013 1201   ALBUMIN 4.3 04/25/2013 1201   AST 24 04/25/2013 1201   ALT 40* 04/25/2013 1201   ALKPHOS 87 04/25/2013 1201   BILITOT 0.2* 04/25/2013 1201   GFRNONAA 73* 04/25/2013 1201   GFRAA 84* 04/25/2013 1201    Lab Results  Component Value Date/Time   CHOL 151 04/20/2013  9:53 PM    No components found with this basename: hga1c    Lab Results  Component Value Date/Time   AST 24 04/25/2013 12:01 PM    Assessment and Plan  Other emphysema - Plan: I have prescribed  albuterol (PROVENTIL HFA;VENTOLIN HFA) 108 (90 BASE) MCG/ACT inhaler to use when necessary  Cough - Plan: benzonatate (TESSALON) 100 MG capsule  Bronchitis Continue with her Avelox.   Smoking Patient is counseled and she'll start taking Chantix.  Unspecified essential hypertension - Plan: Blood pressure is well controlled will repeat her COMPLETE METABOLIC PANEL WITH GFR  Return in about 3 months (around 11/12/2013), or if symptoms worsen or fail to improve, for hypertension.  Lorayne Marek, MD

## 2013-08-12 NOTE — Telephone Encounter (Signed)
Pt. Melanie Harris

## 2013-08-13 ENCOUNTER — Telehealth: Payer: Self-pay

## 2013-08-13 MED ORDER — AMLODIPINE BESYLATE 2.5 MG PO TABS: 2.5000 mg | ORAL_TABLET | Freq: Every day | ORAL | Status: DC

## 2013-08-13 NOTE — Telephone Encounter (Signed)
Message copied by Dorothe Pea on Thu Aug 13, 2013 12:46 PM ------      Message from: Lorayne Marek      Created: Thu Aug 13, 2013 10:56 AM       Blood work reviewed noticed worsening renal function with elevated BUN/creatinine and electrolyte abnormality of sodium and chloride, advise patient to stop taking hydrochlorothiazide, she can start taking Norvasc 2.5 mg, come back  in 2 weeks for BP check will repeat blood chemistry at that time ------

## 2013-08-13 NOTE — Telephone Encounter (Signed)
Patient is aware of her lab results And understands to stop taking the HCTZ Patient is aware a [facility]  Can pick it up

## 2013-08-18 ENCOUNTER — Ambulatory Visit: Payer: Self-pay

## 2013-08-18 ENCOUNTER — Ambulatory Visit: Payer: Self-pay | Attending: Internal Medicine | Admitting: *Deleted

## 2013-08-18 VITALS — BP 128/73 | HR 84

## 2013-08-18 DIAGNOSIS — I10 Essential (primary) hypertension: Secondary | ICD-10-CM

## 2013-08-18 LAB — COMPLETE METABOLIC PANEL WITH GFR
ALT: 47 U/L — AB (ref 0–35)
AST: 37 U/L (ref 0–37)
Albumin: 4.1 g/dL (ref 3.5–5.2)
Alkaline Phosphatase: 54 U/L (ref 39–117)
BUN: 20 mg/dL (ref 6–23)
CALCIUM: 9 mg/dL (ref 8.4–10.5)
CHLORIDE: 102 meq/L (ref 96–112)
CO2: 25 meq/L (ref 19–32)
CREATININE: 0.7 mg/dL (ref 0.50–1.10)
GFR, Est Non African American: >89 mL/min
Glucose, Bld: 128 mg/dL — ABNORMAL HIGH (ref 70–99)
Potassium: 4.2 mEq/L (ref 3.5–5.3)
SODIUM: 133 meq/L — AB (ref 135–145)
TOTAL PROTEIN: 6 g/dL (ref 6.0–8.3)
Total Bilirubin: 0.3 mg/dL (ref 0.2–1.2)

## 2013-08-18 LAB — TSH: TSH: 0.827 u[IU]/mL (ref 0.350–4.500)

## 2013-08-18 NOTE — Patient Instructions (Signed)
Hypertension Hypertension, commonly called high blood pressure, is when the force of blood pumping through your arteries is too strong. Your arteries are the blood vessels that carry blood from your heart throughout your body. A blood pressure reading consists of a higher number over a lower number, such as 110/72. The higher number (systolic) is the pressure inside your arteries when your heart pumps. The lower number (diastolic) is the pressure inside your arteries when your heart relaxes. Ideally you want your blood pressure below 120/80. Hypertension forces your heart to work harder to pump blood. Your arteries may become narrow or stiff. Having hypertension puts you at risk for heart disease, stroke, and other problems.  RISK FACTORS Some risk factors for high blood pressure are controllable. Others are not.  Risk factors you cannot control include:   Race. You may be at higher risk if you are African American.  Age. Risk increases with age.  Gender. Men are at higher risk than women before age 45 years. After age 65, women are at higher risk than men. Risk factors you can control include:  Not getting enough exercise or physical activity.  Being overweight.  Getting too much fat, sugar, calories, or salt in your diet.  Drinking too much alcohol. SIGNS AND SYMPTOMS Hypertension does not usually cause signs or symptoms. Extremely high blood pressure (hypertensive crisis) may cause headache, anxiety, shortness of breath, and nosebleed. DIAGNOSIS  To check if you have hypertension, your health care provider will measure your blood pressure while you are seated, with your arm held at the level of your heart. It should be measured at least twice using the same arm. Certain conditions can cause a difference in blood pressure between your right and left arms. A blood pressure reading that is higher than normal on one occasion does not mean that you need treatment. If one blood pressure reading  is high, ask your health care provider about having it checked again. TREATMENT  Treating high blood pressure includes making lifestyle changes and possibly taking medication. Living a healthy lifestyle can help lower high blood pressure. You may need to change some of your habits. Lifestyle changes may include:  Following the DASH diet. This diet is high in fruits, vegetables, and whole grains. It is low in salt, red meat, and added sugars.  Getting at least 2 1/2 hours of brisk physical activity every week.  Losing weight if necessary.  Not smoking.  Limiting alcoholic beverages.  Learning ways to reduce stress. If lifestyle changes are not enough to get your blood pressure under control, your health care provider may prescribe medicine. You may need to take more than one. Work closely with your health care provider to understand the risks and benefits. HOME CARE INSTRUCTIONS  Have your blood pressure rechecked as directed by your health care provider.   Only take medicine as directed by your health care provider. Follow the directions carefully. Blood pressure medicines must be taken as prescribed. The medicine does not work as well when you skip doses. Skipping doses also puts you at risk for problems.   Do not smoke.   Monitor your blood pressure at home as directed by your health care provider. SEEK MEDICAL CARE IF:   You think you are having a reaction to medicines taken.  You have recurrent headaches or feel dizzy.  You have swelling in your ankles.  You have trouble with your vision. SEEK IMMEDIATE MEDICAL CARE IF:  You develop a severe headache or   confusion.  You have unusual weakness, numbness, or feel faint.  You have severe chest or abdominal pain.  You vomit repeatedly.  You have trouble breathing. MAKE SURE YOU:   Understand these instructions.  Will watch your condition.  Will get help right away if you are not doing well or get  worse. Document Released: 01/22/2005 Document Revised: 01/27/2013 Document Reviewed: 11/14/2012 ExitCare Patient Information 2015 [employer]. This information is not intended to replace advice given to you by your health care provider. Make sure you discuss any questions you have with your health care provider.  

## 2013-08-19 LAB — VITAMIN D 25 HYDROXY (VIT D DEFICIENCY, FRACTURES): Vit D, 25-Hydroxy: 36 ng/mL (ref 30–89)

## 2013-10-07 ENCOUNTER — Telehealth: Payer: Self-pay

## 2013-10-07 NOTE — Telephone Encounter (Signed)
Letter was sent to pt on 08/10/2013 to check insurance and call with needed info to be triaged for screening colonoscopy. Pt has not responded.

## 2013-10-10 ENCOUNTER — Emergency Department (INDEPENDENT_AMBULATORY_CARE_PROVIDER_SITE_OTHER)
Admission: EM | Admit: 2013-10-10 | Discharge: 2013-10-10 | Disposition: A | Discharge status: Home / Self Care | Payer: Self-pay | Source: Home / Self Care | Attending: Family Medicine | Admitting: Family Medicine

## 2013-10-10 ENCOUNTER — Encounter (HOSPITAL_COMMUNITY): Payer: Self-pay | Admitting: Emergency Medicine

## 2013-10-10 DIAGNOSIS — L239 Allergic contact dermatitis, unspecified cause: Secondary | ICD-10-CM

## 2013-10-10 DIAGNOSIS — L259 Unspecified contact dermatitis, unspecified cause: Secondary | ICD-10-CM

## 2013-10-10 MED ORDER — TRIAMCINOLONE ACETONIDE 40 MG/ML IJ SUSP
INTRAMUSCULAR | Status: AC
  Filled 2013-10-10: qty 1

## 2013-10-10 MED ORDER — METHYLPREDNISOLONE ACETATE 80 MG/ML IJ SUSP
INTRAMUSCULAR | Status: AC
  Filled 2013-10-10: qty 1

## 2013-10-10 MED ORDER — METHYLPREDNISOLONE ACETATE 40 MG/ML IJ SUSP
80.0000 mg | Freq: Once | INTRAMUSCULAR | Status: AC
  Administered 2013-10-10: 80 mg via INTRAMUSCULAR

## 2013-10-10 MED ORDER — TRIAMCINOLONE ACETONIDE 40 MG/ML IJ SUSP
40.0000 mg | Freq: Once | INTRAMUSCULAR | Status: AC
  Administered 2013-10-10: 40 mg via INTRAMUSCULAR

## 2013-10-10 MED ORDER — HYDROXYZINE HCL 25 MG PO TABS: 25.0000 mg | ORAL_TABLET | Freq: Four times a day (QID) | ORAL | Status: DC

## 2013-10-10 NOTE — Discharge Instructions (Signed)
Take medicine as needed, see your doctor if further problems.

## 2013-10-10 NOTE — ED Provider Notes (Signed)
CSN: [id number]     Arrival date & time 10/10/13  1520 History   First MD Initiated Contact with Patient 10/10/13 [phone]     Chief Complaint  Patient presents with  . Rash   (Consider location/radiation/quality/duration/timing/severity/associated sxs/prior Treatment) Patient is a Melanie Harris presenting with rash. The history is provided by the patient.  Rash Location:  Shoulder/arm and leg Shoulder/arm rash location:  L forearm, L upper arm, R upper arm and R forearm Leg rash location:  L upper leg, R upper leg, L lower leg and R lower leg Quality: itchiness and redness   Severity:  Moderate Onset quality:  Gradual Duration:  10 days Chronicity:  New Relieved by:  None tried Worsened by:  Nothing tried Ineffective treatments:  None tried Associated symptoms: no fever, no shortness of breath, no throat swelling and no tongue swelling     Past Medical History  Diagnosis Date  . Abscess of thigh     right  . Hypertension    Past Surgical History  Procedure Laterality Date  . Abdominal hysterectomy  2004  . Tonsillectomy  10-12 yrs old   Family History  Problem Relation Age of Onset  . Lung cancer Father   . Heart failure Mother    History  Substance Use Topics  . Smoking status: Current Every Day Smoker -- 0.50 packs/day  . Smokeless tobacco: Not on file  . Alcohol Use: 0.6 oz/week    1 Glasses of wine per week   OB History   Grav Para Term Preterm Abortions TAB SAB Ect Mult Living                 Review of Systems  Constitutional: Negative.  Negative for fever.  Respiratory: Negative for shortness of breath.   Skin: Positive for rash.    Allergies  Doxycycline  Home Medications   Prior to Admission medications   Medication Sig Start Date End Date Taking? Authorizing Provider  albuterol (PROVENTIL HFA;VENTOLIN HFA) 108 (90 BASE) MCG/ACT inhaler Inhale 2 puffs into the lungs every 6 (six) hours as needed for wheezing or shortness of breath. 08/12/13   Lorayne Marek, MD  amLODipine (NORVASC) 2.5 MG tablet Take 1 tablet (2.5 mg total) by mouth daily. 08/13/13   Lorayne Marek, MD  amoxicillin (AMOXIL) 500 MG tablet Take 1 tablet (500 mg total) by mouth 2 (two) times daily. 04/22/13   Verlee Monte, MD  benzonatate (TESSALON) 100 MG capsule Take 1 capsule (100 mg total) by mouth 3 (three) times daily as needed for cough. 08/12/13   Lorayne Marek, MD  hydrochlorothiazide (HYDRODIURIL) 25 MG tablet Take 1 tablet (25 mg total) by mouth daily. 07/02/13   Lance Bosch, NP  hydrocortisone cream 0.5 % Apply 1 application topically 2 (two) times daily as needed for itching.    Historical Provider, MD  hydrOXYzine (ATARAX/VISTARIL) 10 MG tablet Take 1-2 tablets (10-20 mg total) by mouth every 8 (eight) hours as needed for itching. 05/04/13   Reyne Dumas, MD  hydrOXYzine (ATARAX/VISTARIL) 25 MG tablet Take 1 tablet (25 mg total) by mouth every 6 (six) hours. For itching 10/10/13   Billy Fischer, MD  levofloxacin (LEVAQUIN) 500 MG tablet Take 500 mg by mouth daily. Prescribed daily for ten days    Historical Provider, MD  lisinopril (PRINIVIL,ZESTRIL) 20 MG tablet Take 1 tablet (20 mg total) by mouth daily. 08/04/13   Lorayne Marek, MD  moxifloxacin (AVELOX) 400 MG tablet Take 1 tablet (400 mg  total) by mouth daily. 08/10/13   Billy Fischer, MD  nicotine (EQ NICOTINE) 14 mg/24hr patch Place 1 patch (14 mg total) onto the skin daily. 05/04/13   Reyne Dumas, MD  varenicline (CHANTIX PAK) 0.5 MG X 11 & 1 MG X 42 tablet Take one 0.5 mg tablet by mouth once daily for 3 days, then increase to one 0.5 mg tablet twice daily for 4 days, then increase to one 1 mg tablet twice daily. 08/04/13   Lorayne Marek, MD   BP 118/74  Pulse 112  Temp(Src) 98.6 F (37 C) (Oral)  Resp 18  SpO2 98% Physical Exam  Nursing note and vitals reviewed. Constitutional: She is oriented to person, place, and time. She appears well-developed and well-nourished.  Neurological: She is alert and oriented to  person, place, and time.  Skin: Skin is warm and dry. Rash noted. There is erythema.    ED Course  Procedures (including critical care time) Labs Review Labs Reviewed - No data to display  Imaging Review No results found.   MDM   1. Allergic contact dermatitis        Billy Fischer, MD 10/10/13 1626

## 2013-10-10 NOTE — ED Notes (Signed)
Pt  Has  A red  Raised     Rash   On  Both arms      With  Small bumps  As   Well     With   Some  Rash on  Back  And  Upper  Legs  As  Well       Pt  denys  Any known  Causative  Agents  Or  Events   Reports  Has  Had  Dermatitis  In  Past

## 2013-11-04 ENCOUNTER — Ambulatory Visit: Payer: Self-pay | Admitting: Internal Medicine

## 2013-12-11 ENCOUNTER — Ambulatory Visit: Payer: Self-pay | Attending: Internal Medicine

## 2013-12-16 ENCOUNTER — Other Ambulatory Visit: Payer: Self-pay | Admitting: Internal Medicine

## 2014-04-19 ENCOUNTER — Ambulatory Visit: Payer: Managed Care, Other (non HMO) | Attending: Internal Medicine | Admitting: Internal Medicine

## 2014-04-19 ENCOUNTER — Encounter: Payer: Self-pay | Admitting: Internal Medicine

## 2014-04-19 VITALS — BP 152/75 | HR 91 | Temp 98.0°F | Resp 16 | Wt 104.0 lb

## 2014-04-19 DIAGNOSIS — F17210 Nicotine dependence, cigarettes, uncomplicated: Secondary | ICD-10-CM | POA: Diagnosis not present

## 2014-04-19 DIAGNOSIS — Z1211 Encounter for screening for malignant neoplasm of colon: Secondary | ICD-10-CM

## 2014-04-19 DIAGNOSIS — I10 Essential (primary) hypertension: Secondary | ICD-10-CM | POA: Insufficient documentation

## 2014-04-19 DIAGNOSIS — H538 Other visual disturbances: Secondary | ICD-10-CM | POA: Diagnosis not present

## 2014-04-19 DIAGNOSIS — Z23 Encounter for immunization: Secondary | ICD-10-CM | POA: Insufficient documentation

## 2014-04-19 DIAGNOSIS — Z1231 Encounter for screening mammogram for malignant neoplasm of breast: Secondary | ICD-10-CM

## 2014-04-19 DIAGNOSIS — Z720 Tobacco use: Secondary | ICD-10-CM

## 2014-04-19 DIAGNOSIS — F17200 Nicotine dependence, unspecified, uncomplicated: Secondary | ICD-10-CM

## 2014-04-19 LAB — COMPLETE METABOLIC PANEL WITH GFR
ALBUMIN: 4.8 g/dL (ref 3.5–5.2)
ALT: 17 U/L (ref 0–35)
AST: 19 U/L (ref 0–37)
Alkaline Phosphatase: 64 U/L (ref 39–117)
BUN: 19 mg/dL (ref 6–23)
CALCIUM: 10 mg/dL (ref 8.4–10.5)
CHLORIDE: 101 meq/L (ref 96–112)
CO2: 25 meq/L (ref 19–32)
Creat: 0.84 mg/dL (ref 0.50–1.10)
GFR, Est Non African American: 80 mL/min
GLUCOSE: 93 mg/dL (ref 70–99)
POTASSIUM: 5.1 meq/L (ref 3.5–5.3)
Sodium: 137 mEq/L (ref 135–145)
Total Bilirubin: 0.5 mg/dL (ref 0.2–1.2)
Total Protein: 7 g/dL (ref 6.0–8.3)

## 2014-04-19 MED ORDER — LISINOPRIL 20 MG PO TABS: 20.0000 mg | ORAL_TABLET | Freq: Every day | ORAL | Status: DC

## 2014-04-19 NOTE — Progress Notes (Signed)
Patient here for follow up Requesting a referral to eye doctor States she feels like she has something in her left eye for a while now Has not had her eyes checked in a while

## 2014-04-19 NOTE — Patient Instructions (Signed)
DASH Eating Plan °DASH stands for "Dietary Approaches to Stop Hypertension." The DASH eating plan is a healthy eating plan that has been shown to reduce high blood pressure (hypertension). Additional health benefits may include reducing the risk of type 2 diabetes mellitus, heart disease, and stroke. The DASH eating plan may also help with weight loss. °WHAT DO I NEED TO KNOW ABOUT THE DASH EATING PLAN? °For the DASH eating plan, you will follow these general guidelines: °· Choose foods with a percent daily value for sodium of less than 5% (as listed on the food label). °· Use salt-free seasonings or herbs instead of table salt or sea salt. °· Check with your health care provider or pharmacist before using salt substitutes. °· Eat lower-sodium products, often labeled as "lower sodium" or "no salt added." °· Eat fresh foods. °· Eat more vegetables, fruits, and low-fat dairy products. °· Choose whole grains. Look for the word "whole" as the first word in the ingredient list. °· Choose fish and skinless chicken or turkey more often than red meat. Limit fish, poultry, and meat to 6 oz (170 g) each day. °· Limit sweets, desserts, sugars, and sugary drinks. °· Choose heart-healthy fats. °· Limit cheese to 1 oz (28 g) per day. °· Eat more home-cooked food and less restaurant, buffet, and fast food. °· Limit fried foods. °· Cook foods using methods other than frying. °· Limit canned vegetables. If you do use them, rinse them well to decrease the sodium. °· When eating at a restaurant, ask that your food be prepared with less salt, or no salt if possible. °WHAT FOODS CAN I EAT? °Seek help from a dietitian for individual calorie needs. °Grains °Whole grain or whole wheat bread. Brown rice. Whole grain or whole wheat pasta. Quinoa, bulgur, and whole grain cereals. Low-sodium cereals. Corn or whole wheat flour tortillas. Whole grain cornbread. Whole grain crackers. Low-sodium crackers. °Vegetables °Fresh or frozen vegetables  (raw, steamed, roasted, or grilled). Low-sodium or reduced-sodium tomato and vegetable juices. Low-sodium or reduced-sodium tomato sauce and paste. Low-sodium or reduced-sodium canned vegetables.  °Fruits °All fresh, canned (in natural juice), or frozen fruits. °Meat and Other Protein Products °Ground beef (85% or leaner), grass-fed beef, or beef trimmed of fat. Skinless chicken or turkey. Ground chicken or turkey. Pork trimmed of fat. All fish and seafood. Eggs. Dried beans, peas, or lentils. Unsalted nuts and seeds. Unsalted canned beans. °Dairy °Low-fat dairy products, such as skim or 1% milk, 2% or reduced-fat cheeses, low-fat ricotta or cottage cheese, or plain low-fat yogurt. Low-sodium or reduced-sodium cheeses. °Fats and Oils °Tub margarines without trans fats. Light or reduced-fat mayonnaise and salad dressings (reduced sodium). Avocado. Safflower, olive, or canola oils. Natural peanut or almond butter. °Other °Unsalted popcorn and pretzels. °The items listed above may not be a complete list of recommended foods or beverages. Contact your dietitian for more options. °WHAT FOODS ARE NOT RECOMMENDED? °Grains °White bread. White pasta. White rice. Refined cornbread. Bagels and croissants. Crackers that contain trans fat. °Vegetables °Creamed or fried vegetables. Vegetables in a cheese sauce. Regular canned vegetables. Regular canned tomato sauce and paste. Regular tomato and vegetable juices. °Fruits °Dried fruits. Canned fruit in light or heavy syrup. Fruit juice. °Meat and Other Protein Products °Fatty cuts of meat. Ribs, chicken wings, bacon, sausage, bologna, salami, chitterlings, fatback, hot dogs, bratwurst, and packaged luncheon meats. Salted nuts and seeds. Canned beans with salt. °Dairy °Whole or 2% milk, cream, half-and-half, and cream cheese. Whole-fat or sweetened yogurt. Full-fat   cheeses or blue cheese. Nondairy creamers and whipped toppings. Processed cheese, cheese spreads, or cheese  curds. °Condiments °Onion and garlic salt, seasoned salt, table salt, and sea salt. Canned and packaged gravies. Worcestershire sauce. Tartar sauce. Barbecue sauce. Teriyaki sauce. Soy sauce, including reduced sodium. Steak sauce. Fish sauce. Oyster sauce. Cocktail sauce. Horseradish. Ketchup and mustard. Meat flavorings and tenderizers. Bouillon cubes. Hot sauce. Tabasco sauce. Marinades. Taco seasonings. Relishes. °Fats and Oils °Butter, stick margarine, lard, shortening, ghee, and bacon fat. Coconut, palm kernel, or palm oils. Regular salad dressings. °Other °Pickles and olives. Salted popcorn and pretzels. °The items listed above may not be a complete list of foods and beverages to avoid. Contact your dietitian for more information. °WHERE CAN I FIND MORE INFORMATION? °National [employer], [employer], [employer]: www.nhlbi.nih.gov/health/health-topics/topics/dash/ °Document Released: 01/11/2011 Document Revised: 06/08/2013 Document Reviewed: 11/26/2012 °ExitCare® Patient Information ©2015 [employer]. This information is not intended to replace advice given to you by your health care provider. Make sure you discuss any questions you have with your health care provider. ° °

## 2014-04-19 NOTE — Progress Notes (Signed)
MRN: [id number] Name: Melanie Harris  Sex: female Age: 54 y.o. DOB: [birth year]  Allergies: Doxycycline  Chief Complaint  Patient presents with  . Follow-up    HPI: Patient is 54 y.o. female who has to of hypertension, comes today for followup today her blood pressure is borderline elevated, denies any headache dizziness chest and shortness of breath, she does wear corrective glasses and does complain of blurry vision and is requesting referral to see ophthalmologist, she is still smoking cigarettes, I have counseled patient to quit smoking.  Past Medical History  Diagnosis Date  . Abscess of thigh     right  . Hypertension     Past Surgical History  Procedure Laterality Date  . Abdominal hysterectomy  2004  . Tonsillectomy  10-12 yrs old      Medication List       This list is accurate as of: 04/19/14  3:55 PM.  Always use your most recent med list.               albuterol 108 (90 BASE) MCG/ACT inhaler  Commonly known as:  PROVENTIL HFA;VENTOLIN HFA  Inhale 2 puffs into the lungs every 6 (six) hours as needed for wheezing or shortness of breath.     amLODipine 2.5 MG tablet  Commonly known as:  NORVASC  Take 1 tablet (2.5 mg total) by mouth daily.     amoxicillin 500 MG tablet  Commonly known as:  AMOXIL  Take 1 tablet (500 mg total) by mouth 2 (two) times daily.     benzonatate 100 MG capsule  Commonly known as:  TESSALON  Take 1 capsule (100 mg total) by mouth 3 (three) times daily as needed for cough.     hydrochlorothiazide 25 MG tablet  Commonly known as:  HYDRODIURIL  Take 1 tablet (25 mg total) by mouth daily.     hydrocortisone cream 0.5 %  Apply 1 application topically 2 (two) times daily as needed for itching.     hydrOXYzine 10 MG tablet  Commonly known as:  ATARAX/VISTARIL  Take 1-2 tablets (10-20 mg total) by mouth every 8 (eight) hours as needed for itching.     hydrOXYzine 25 MG tablet  Commonly known as:  ATARAX/VISTARIL  Take 1  tablet (25 mg total) by mouth every 6 (six) hours. For itching     levofloxacin 500 MG tablet  Commonly known as:  LEVAQUIN  Take 500 mg by mouth daily. Prescribed daily for ten days     lisinopril 20 MG tablet  Commonly known as:  PRINIVIL,ZESTRIL  Take 1 tablet (20 mg total) by mouth daily.     moxifloxacin 400 MG tablet  Commonly known as:  AVELOX  Take 1 tablet (400 mg total) by mouth daily.     nicotine 14 mg/24hr patch  Commonly known as:  EQ NICOTINE  Place 1 patch (14 mg total) onto the skin daily.     varenicline 0.5 MG X 11 & 1 MG X 42 tablet  Commonly known as:  CHANTIX PAK  Take one 0.5 mg tablet by mouth once daily for 3 days, then increase to one 0.5 mg tablet twice daily for 4 days, then increase to one 1 mg tablet twice daily.        Meds ordered this encounter  Medications  . lisinopril (PRINIVIL,ZESTRIL) 20 MG tablet    Sig: Take 1 tablet (20 mg total) by mouth daily.    Dispense:  30 tablet  Refill:  3    Immunization History  Administered Date(s) Administered  . Influenza,inj,Quad PF,36+ Mos 04/22/2013, 04/19/2014  . Pneumococcal Polysaccharide-23 04/22/2013    Family History  Problem Relation Age of Onset  . Lung cancer Father   . Heart failure Mother     History  Substance Use Topics  . Smoking status: Current Every Day Smoker -- 0.50 packs/day  . Smokeless tobacco: Not on file  . Alcohol Use: 0.6 oz/week    1 Glasses of wine per week    Review of Systems   As noted in HPI  Filed Vitals:   04/19/14 1425  BP: 152/75  Pulse: 91  Temp: 98 F (36.7 C)  Resp: 16    Physical Exam  Physical Exam  Constitutional: No distress.  Eyes: Conjunctivae and EOM are normal. Pupils are equal, round, and reactive to light.  Cardiovascular: Normal rate and regular rhythm.   Pulmonary/Chest: Breath sounds normal. No respiratory distress. She has no wheezes. She has no rales.    CBC    Component Value Date/Time   WBC 10.1 04/25/2013  1201   RBC 4.60 04/25/2013 1201   HGB 15.2* 04/25/2013 1201   HCT 43.6 04/25/2013 1201   PLT 303 04/25/2013 1201   MCV 94.8 04/25/2013 1201   LYMPHSABS 2.1 04/25/2013 1201   MONOABS 1.0 04/25/2013 1201   EOSABS 1.1* 04/25/2013 1201   BASOSABS 0.0 04/25/2013 1201    CMP     Component Value Date/Time   NA 133* 08/18/2013 1626   K 4.2 08/18/2013 1626   CL 102 08/18/2013 1626   CO2 25 08/18/2013 1626   GLUCOSE 128* 08/18/2013 1626   BUN 20 08/18/2013 1626   CREATININE 0.70 08/18/2013 1626   CREATININE 0.90 04/25/2013 1201   CALCIUM 9.0 08/18/2013 1626   PROT 6.0 08/18/2013 1626   ALBUMIN 4.1 08/18/2013 1626   AST 37 08/18/2013 1626   ALT 47* 08/18/2013 1626   ALKPHOS 54 08/18/2013 1626   BILITOT 0.3 08/18/2013 1626   GFRNONAA >89 08/18/2013 1626   GFRNONAA 73* 04/25/2013 1201   GFRAA >89 08/18/2013 1626   GFRAA 84* 04/25/2013 1201    Lab Results  Component Value Date/Time   CHOL 151 04/20/2013 09:53 PM    No components found for: HGA1C  Lab Results  Component Value Date/Time   AST 37 08/18/2013 04:26 PM    Assessment and Plan  Essential hypertension - Plan: advised patient for DASH diet, continue with lisinopril (PRINIVIL,ZESTRIL) 20 MG tablet, COMPLETE METABOLIC PANEL WITH GFR  Smoking Advise patient to quit smoking. Encounter for screening mammogram for breast cancer - Plan: MM DIGITAL SCREENING BILATERAL  Needs flu shot Flu shot given today  Blurry vision - Plan: Ambulatory referral to Ophthalmology  Special screening for malignant neoplasms, colon - Plan: Ambulatory referral to Gastroenterology   Health Maintenance -Colonoscopy:referral done -Pap Smear:will be scheduled -Mammogram:ordered -Vaccinations:  Flu shot given today  Return in about 3 months (around 07/20/2014) for hypertension, Schedule Appt with Dr Burman Freestone for PAP.   This note has been created with Surveyor, quantity. Any  transcriptional errors are unintentional.    Lorayne Marek, MD

## 2014-04-21 ENCOUNTER — Telehealth: Payer: Self-pay

## 2014-04-21 NOTE — Telephone Encounter (Signed)
-----   Message from Lorayne Marek, MD sent at 04/20/2014 12:47 PM EDT ----- Call and let the Patient know that blood work is normal.

## 2014-04-21 NOTE — Telephone Encounter (Signed)
Patient is aware of her lab results 

## 2014-05-11 ENCOUNTER — Ambulatory Visit (HOSPITAL_COMMUNITY)
Admission: RE | Admit: 2014-05-11 | Discharge: 2014-05-11 | Disposition: A | Discharge status: Home / Self Care | Payer: Managed Care, Other (non HMO) | Source: Ambulatory Visit | Attending: Internal Medicine | Admitting: Internal Medicine

## 2014-05-11 DIAGNOSIS — Z1231 Encounter for screening mammogram for malignant neoplasm of breast: Secondary | ICD-10-CM | POA: Insufficient documentation

## 2014-08-26 ENCOUNTER — Other Ambulatory Visit: Payer: Self-pay | Admitting: Internal Medicine

## 2014-09-28 ENCOUNTER — Other Ambulatory Visit: Payer: Self-pay | Admitting: Internal Medicine

## 2014-09-29 ENCOUNTER — Other Ambulatory Visit: Payer: Self-pay

## 2014-09-29 DIAGNOSIS — I10 Essential (primary) hypertension: Secondary | ICD-10-CM

## 2014-09-29 MED ORDER — LISINOPRIL 20 MG PO TABS: 20.0000 mg | ORAL_TABLET | Freq: Every day | ORAL | Status: DC

## 2014-09-29 MED ORDER — AMLODIPINE BESYLATE 2.5 MG PO TABS: 2.5000 mg | ORAL_TABLET | Freq: Every day | ORAL | Status: DC

## 2015-02-03 ENCOUNTER — Other Ambulatory Visit: Payer: Self-pay | Admitting: Internal Medicine

## 2015-02-03 ENCOUNTER — Telehealth: Payer: Self-pay | Admitting: Pharmacist

## 2015-02-03 NOTE — Telephone Encounter (Signed)
Called patient as I received a refill request for lisinopril and patient has not been seen since 04/2014.  Unable to reach patient so left a HIPAA compliant message requesting that the patient call to schedule an appointment with [facility].  Refilled x 1 month to allow patient time to get in to see provider but patient needs to be seen for labs and follow up prior to more refills.

## 2015-02-10 MED FILL — AMLODIPINE BESYLATE 2.5 MG: 2.5 | 30 days supply | Qty: 30 | Fill #4

## 2015-02-10 MED FILL — ?LISINOPRIL 20 MG TABLET: 20 | 30 days supply | Qty: 30 | Fill #0

## 2015-03-15 MED FILL — AMLODIPINE BESYLATE 2.5 MG: 2.5 | 30 days supply | Qty: 30 | Fill #5

## 2015-04-14 MED FILL — AMLODIPINE BESYLATE 2.5 MG: 2.5 | 30 days supply | Qty: 30 | Fill #6

## 2015-05-12 MED FILL — AMLODIPINE BESYLATE 2.5 MG: 2.5 | 30 days supply | Qty: 30 | Fill #7

## 2015-06-13 MED FILL — AMLODIPINE BESYLATE 2.5 MG: 2.5 | 30 days supply | Qty: 30 | Fill #8

## 2015-07-13 MED FILL — AMLODIPINE BESYLATE 2.5 MG: 2.5 | 30 days supply | Qty: 30 | Fill #9

## 2015-08-10 MED FILL — AMLODIPINE BESYLATE 2.5 MG: 2.5 | 30 days supply | Qty: 30 | Fill #10

## 2015-09-07 MED FILL — AMLODIPINE BESYLATE 2.5 MG: 2.5 | 30 days supply | Qty: 30 | Fill #11

## 2015-10-11 ENCOUNTER — Other Ambulatory Visit: Payer: Self-pay | Admitting: Internal Medicine

## 2015-10-12 ENCOUNTER — Other Ambulatory Visit: Payer: Self-pay | Admitting: Internal Medicine

## 2015-10-12 MED FILL — AMLODIPINE BESYLATE 2.5 MG: 2.5 | 30 days supply | Qty: 30 | Fill #0

## 2015-10-25 ENCOUNTER — Ambulatory Visit: Payer: Managed Care, Other (non HMO) | Admitting: Family Medicine

## 2015-11-07 ENCOUNTER — Telehealth: Payer: Self-pay | Admitting: Internal Medicine

## 2015-11-07 MED ORDER — AMLODIPINE BESYLATE 2.5 MG PO TABS: 2.5000 mg | ORAL_TABLET | Freq: Every day | ORAL | 0 refills | Status: DC

## 2015-11-07 MED FILL — AMLODIPINE BESYLATE 2.5 MG: 2.5 | 30 days supply | Qty: 30 | Fill #0

## 2015-11-07 NOTE — Telephone Encounter (Signed)
Medication Refill: amLODipine (NORVASC) 2.5 MG tablet   Pt states she will run out of Rx on 10/9 but scheduled an appointment to re-establish care on 10/16 with Dr. Jarold Song Pt requesting a sample refill to get her through until her appointment

## 2015-11-07 NOTE — Telephone Encounter (Signed)
amlodpine refilled.

## 2015-11-21 ENCOUNTER — Ambulatory Visit: Payer: Self-pay | Attending: Family Medicine | Admitting: Family Medicine

## 2015-11-21 ENCOUNTER — Encounter: Payer: Self-pay | Admitting: Family Medicine

## 2015-11-21 VITALS — BP 165/80 | HR 90 | Temp 98.0°F | Ht 67.0 in | Wt 101.6 lb

## 2015-11-21 DIAGNOSIS — Z23 Encounter for immunization: Secondary | ICD-10-CM | POA: Insufficient documentation

## 2015-11-21 DIAGNOSIS — F17200 Nicotine dependence, unspecified, uncomplicated: Secondary | ICD-10-CM

## 2015-11-21 DIAGNOSIS — Z79899 Other long term (current) drug therapy: Secondary | ICD-10-CM | POA: Insufficient documentation

## 2015-11-21 DIAGNOSIS — I10 Essential (primary) hypertension: Secondary | ICD-10-CM | POA: Insufficient documentation

## 2015-11-21 DIAGNOSIS — F17210 Nicotine dependence, cigarettes, uncomplicated: Secondary | ICD-10-CM | POA: Insufficient documentation

## 2015-11-21 DIAGNOSIS — J439 Emphysema, unspecified: Secondary | ICD-10-CM | POA: Insufficient documentation

## 2015-11-21 MED ORDER — AMLODIPINE BESYLATE 10 MG PO TABS: 10.0000 mg | ORAL_TABLET | Freq: Every day | ORAL | 5 refills | Status: DC

## 2015-11-21 MED FILL — ?AMLODIPINE BESYLATE 10 MG: 10 | 30 days supply | Qty: 30 | Fill #0

## 2015-11-21 NOTE — Progress Notes (Signed)
Subjective:  Patient ID: Melanie Harris, female    DOB: 14-May-1960  Age: 55 y.o. MRN: [id number]  CC: Hypertension and Emphysema   HPI Melanie Harris is a 55 year old female with Hypertension last seen in the Clinic in 04/2015 who comes in for a follow up visit. She has been compliant with her Amlodipine but ran out of Lisinopril a while ago; was previously on HCTZ which was discontinued to AKI.  Smokes a pack of cigarettes/week. She has no complaints at this time.   Past Medical History:  Diagnosis Date  . Abscess of thigh    right  . Hypertension     Past Surgical History:  Procedure Laterality Date  . ABDOMINAL HYSTERECTOMY  2004  . TONSILLECTOMY  10-12 yrs old     Allergies  Allergen Reactions  . Doxycycline     Rash      Outpatient Medications Prior to Visit  Medication Sig Dispense Refill  . albuterol (PROVENTIL HFA;VENTOLIN HFA) 108 (90 BASE) MCG/ACT inhaler Inhale 2 puffs into the lungs every 6 (six) hours as needed for wheezing or shortness of breath. 1 Inhaler 0  . amLODipine (NORVASC) 2.5 MG tablet Take 1 tablet (2.5 mg total) by mouth daily. 30 tablet 0  . lisinopril (PRINIVIL,ZESTRIL) 20 MG tablet Take 1 tablet (20 mg total) by mouth daily. Must have office visit for more refills (Patient not taking: Reported on 11/21/2015) 30 tablet 0  . amoxicillin (AMOXIL) 500 MG tablet Take 1 tablet (500 mg total) by mouth 2 (two) times daily. 20 tablet 0  . benzonatate (TESSALON) 100 MG capsule Take 1 capsule (100 mg total) by mouth 3 (three) times daily as needed for cough. 30 capsule 1  . hydrochlorothiazide (HYDRODIURIL) 25 MG tablet Take 1 tablet (25 mg total) by mouth daily. (Patient not taking: Reported on 11/21/2015) 90 tablet 3  . hydrocortisone cream 0.5 % Apply 1 application topically 2 (two) times daily as needed for itching.    . hydrOXYzine (ATARAX/VISTARIL) 10 MG tablet Take 1-2 tablets (10-20 mg total) by mouth every 8 (eight) hours as needed for  itching. 60 tablet 1  . hydrOXYzine (ATARAX/VISTARIL) 25 MG tablet Take 1 tablet (25 mg total) by mouth every 6 (six) hours. For itching 20 tablet 1  . levofloxacin (LEVAQUIN) 500 MG tablet Take 500 mg by mouth daily. Prescribed daily for ten days    . moxifloxacin (AVELOX) 400 MG tablet Take 1 tablet (400 mg total) by mouth daily. 10 tablet 0  . nicotine (EQ NICOTINE) 14 mg/24hr patch Place 1 patch (14 mg total) onto the skin daily. 28 patch 0  . varenicline (CHANTIX PAK) 0.5 MG X 11 & 1 MG X 42 tablet Take one 0.5 mg tablet by mouth once daily for 3 days, then increase to one 0.5 mg tablet twice daily for 4 days, then increase to one 1 mg tablet twice daily. 53 tablet 0   No facility-administered medications prior to visit.     ROS Review of Systems  Constitutional: Negative for activity change and appetite change.  HENT: Negative for sinus pressure and sore throat.   Respiratory: Negative for chest tightness, shortness of breath and wheezing.   Cardiovascular: Negative for chest pain and palpitations.  Gastrointestinal: Negative for abdominal distention, abdominal pain and constipation.  Genitourinary: Negative.   Musculoskeletal: Negative.   Psychiatric/Behavioral: Negative for behavioral problems and dysphoric mood.    Objective:  BP (!) 165/80 (BP Location: Right Arm, Patient Position:  Sitting, Cuff Size: Small)   Pulse 90   Temp 98 F (36.7 C) (Oral)   Ht 5\' 7"  (1.702 m)   Wt 101 lb 9.6 oz (46.1 kg)   SpO2 98%   BMI 15.91 kg/m   BP/Weight 11/21/2015 [id number]  Systolic BP 123XX123 [id number]  Diastolic BP 80 75 74  Wt. (Lbs) 101.6 104 -  BMI 15.91 16.28 -      Physical Exam  Constitutional: She is oriented to person, place, and time. She appears well-developed and well-nourished.  Cardiovascular: Normal rate, normal heart sounds and intact distal pulses.   No murmur heard. Pulmonary/Chest: Effort normal and breath sounds normal. She has no wheezes. She has no  rales. She exhibits no tenderness.  Abdominal: Soft. Bowel sounds are normal. She exhibits no distension and no mass. There is no tenderness.  Musculoskeletal: Normal range of motion.  Neurological: She is alert and oriented to person, place, and time.     Assessment & Plan:   1. Smoking Spent 3 minutes counseling on cessation and she is working on quitting  2. Essential hypertension Uncontrolled due to being off Lisinopril HCTZ was previously discontinued due to AKI Increased dose of Amlodipine from 2.5 to 10mg   3. Encounter for immunization - Flu Vaccine QUAD 36+ mos IM   Meds ordered this encounter  Medications  . amLODipine (NORVASC) 10 MG tablet    Sig: Take 1 tablet (10 mg total) by mouth daily.    Dispense:  30 tablet    Refill:  5    Discontinue 2.5mg     Follow-up: Return in about 3 weeks (around 12/12/2015) for follow up on Hypertension.   Arnoldo Morale MD

## 2015-11-21 NOTE — Patient Instructions (Signed)
Hypertension Hypertension, commonly called high blood pressure, is when the force of blood pumping through your arteries is too strong. Your arteries are the blood vessels that carry blood from your heart throughout your body. A blood pressure reading consists of a higher number over a lower number, such as 110/72. The higher number (systolic) is the pressure inside your arteries when your heart pumps. The lower number (diastolic) is the pressure inside your arteries when your heart relaxes. Ideally you want your blood pressure below 120/80. Hypertension forces your heart to work harder to pump blood. Your arteries may become narrow or stiff. Having untreated or uncontrolled hypertension can cause heart attack, stroke, kidney disease, and other problems. RISK FACTORS Some risk factors for high blood pressure are controllable. Others are not.  Risk factors you cannot control include:   Race. You may be at higher risk if you are African American.  Age. Risk increases with age.  Gender. Men are at higher risk than women before age 45 years. After age 65, women are at higher risk than men. Risk factors you can control include:  Not getting enough exercise or physical activity.  Being overweight.  Getting too much fat, sugar, calories, or salt in your diet.  Drinking too much alcohol. SIGNS AND SYMPTOMS Hypertension does not usually cause signs or symptoms. Extremely high blood pressure (hypertensive crisis) may cause headache, anxiety, shortness of breath, and nosebleed. DIAGNOSIS To check if you have hypertension, your health care provider will measure your blood pressure while you are seated, with your arm held at the level of your heart. It should be measured at least twice using the same arm. Certain conditions can cause a difference in blood pressure between your right and left arms. A blood pressure reading that is higher than normal on one occasion does not mean that you need treatment. If  it is not clear whether you have high blood pressure, you may be asked to return on a different day to have your blood pressure checked again. Or, you may be asked to monitor your blood pressure at home for 1 or more weeks. TREATMENT Treating high blood pressure includes making lifestyle changes and possibly taking medicine. Living a healthy lifestyle can help lower high blood pressure. You may need to change some of your habits. Lifestyle changes may include:  Following the DASH diet. This diet is high in fruits, vegetables, and whole grains. It is low in salt, red meat, and added sugars.  Keep your sodium intake below 2,300 mg per day.  Getting at least 30-45 minutes of aerobic exercise at least 4 times per week.  Losing weight if necessary.  Not smoking.  Limiting alcoholic beverages.  Learning ways to reduce stress. Your health care provider may prescribe medicine if lifestyle changes are not enough to get your blood pressure under control, and if one of the following is true:  You are 18-59 years of age and your systolic blood pressure is above 140.  You are 60 years of age or older, and your systolic blood pressure is above 150.  Your diastolic blood pressure is above 90.  You have diabetes, and your systolic blood pressure is over 140 or your diastolic blood pressure is over 90.  You have kidney disease and your blood pressure is above 140/90.  You have heart disease and your blood pressure is above 140/90. Your personal target blood pressure may vary depending on your medical conditions, your age, and other factors. HOME CARE INSTRUCTIONS    Have your blood pressure rechecked as directed by your health care provider.   Take medicines only as directed by your health care provider. Follow the directions carefully. Blood pressure medicines must be taken as prescribed. The medicine does not work as well when you skip doses. Skipping doses also puts you at risk for  problems.  Do not smoke.   Monitor your blood pressure at home as directed by your health care provider. SEEK MEDICAL CARE IF:   You think you are having a reaction to medicines taken.  You have recurrent headaches or feel dizzy.  You have swelling in your ankles.  You have trouble with your vision. SEEK IMMEDIATE MEDICAL CARE IF:  You develop a severe headache or confusion.  You have unusual weakness, numbness, or feel faint.  You have severe chest or abdominal pain.  You vomit repeatedly.  You have trouble breathing. MAKE SURE YOU:   Understand these instructions.  Will watch your condition.  Will get help right away if you are not doing well or get worse.   This information is not intended to replace advice given to you by your health care provider. Make sure you discuss any questions you have with your health care provider.   Document Released: 01/22/2005 Document Revised: 06/08/2014 Document Reviewed: 11/14/2012 Elsevier Interactive Patient Education 2016 [employer] Inc.  

## 2015-12-10 ENCOUNTER — Emergency Department (HOSPITAL_COMMUNITY): Payer: Self-pay

## 2015-12-10 ENCOUNTER — Emergency Department (HOSPITAL_COMMUNITY)
Admission: EM | Admit: 2015-12-10 | Discharge: 2015-12-11 | Disposition: A | Discharge status: Home / Self Care | Payer: Self-pay | Attending: Emergency Medicine | Admitting: Emergency Medicine

## 2015-12-10 ENCOUNTER — Encounter (HOSPITAL_COMMUNITY): Payer: Self-pay | Admitting: *Deleted

## 2015-12-10 DIAGNOSIS — E876 Hypokalemia: Secondary | ICD-10-CM | POA: Insufficient documentation

## 2015-12-10 DIAGNOSIS — R002 Palpitations: Secondary | ICD-10-CM | POA: Insufficient documentation

## 2015-12-10 DIAGNOSIS — I10 Essential (primary) hypertension: Secondary | ICD-10-CM | POA: Insufficient documentation

## 2015-12-10 DIAGNOSIS — Z7982 Long term (current) use of aspirin: Secondary | ICD-10-CM | POA: Insufficient documentation

## 2015-12-10 DIAGNOSIS — E041 Nontoxic single thyroid nodule: Secondary | ICD-10-CM | POA: Insufficient documentation

## 2015-12-10 DIAGNOSIS — F17200 Nicotine dependence, unspecified, uncomplicated: Secondary | ICD-10-CM | POA: Insufficient documentation

## 2015-12-10 LAB — BASIC METABOLIC PANEL
Anion gap: 10 (ref 5–15)
BUN: 14 mg/dL (ref 6–20)
CALCIUM: 9.7 mg/dL (ref 8.9–10.3)
CO2: 24 mmol/L (ref 22–32)
CREATININE: 0.51 mg/dL (ref 0.44–1.00)
Chloride: 105 mmol/L (ref 101–111)
GFR calc Af Amer: >60 mL/min (ref >60)
GLUCOSE: 113 mg/dL — AB (ref 65–99)
POTASSIUM: 3 mmol/L — AB (ref 3.5–5.1)
SODIUM: 139 mmol/L (ref 135–145)

## 2015-12-10 LAB — CBC
HCT: 40.7 % (ref 36.0–46.0)
Hemoglobin: 14.1 g/dL (ref 12.0–15.0)
MCH: 32.5 pg (ref 26.0–34.0)
MCHC: 34.6 g/dL (ref 30.0–36.0)
MCV: 93.8 fL (ref 78.0–100.0)
PLATELETS: 283 10*3/uL (ref 150–400)
RBC: 4.34 MIL/uL (ref 3.87–5.11)
RDW: 12.8 % (ref 11.5–15.5)
WBC: 9.2 10*3/uL (ref 4.0–10.5)

## 2015-12-10 LAB — I-STAT TROPONIN, ED: TROPONIN I, POC: 0 ng/mL (ref 0.00–0.08)

## 2015-12-10 MED ORDER — IOPAMIDOL (ISOVUE-370) INJECTION 76%
100.0000 mL | Freq: Once | INTRAVENOUS | Status: AC | PRN
  Administered 2015-12-10: 80 mL via INTRAVENOUS

## 2015-12-10 MED ORDER — POTASSIUM CHLORIDE CRYS ER 20 MEQ PO TBCR
80.0000 meq | EXTENDED_RELEASE_TABLET | Freq: Once | ORAL | Status: AC
  Administered 2015-12-10: 80 meq via ORAL
  Filled 2015-12-10: qty 4

## 2015-12-10 MED ORDER — SODIUM CHLORIDE 0.9 % IV BOLUS (SEPSIS)
1000.0000 mL | Freq: Once | INTRAVENOUS | Status: AC
  Administered 2015-12-10: 1000 mL via INTRAVENOUS

## 2015-12-10 NOTE — ED Triage Notes (Addendum)
Pt states that she has had palpitations, feeling like her heart is racing off and on x 1 week; pt states that the palpitations got worse this evening; pt states that her left arm is tingling this afternoon as well; pt denies hx of palpitations in the past; pt states that her chest feels tight; pt denies shortness of breath currently but states that she was feeling short of breath earlier Pt reports that her BP meds where changed approx 3 weeks ago

## 2015-12-10 NOTE — ED Notes (Signed)
Patient transported to X-ray 

## 2015-12-10 NOTE — ED Provider Notes (Signed)
[facility] DEPT Provider Note   CSN: [id number] Arrival date & time: 12/10/15  2014  By signing my name below, I, Gwenlyn Fudge, attest that this documentation has been prepared under the direction and in the presence of [facility], PA-C. Electronically Signed: Gwenlyn Fudge, ED Scribe. 12/10/15. 9:51 PM.  History   Chief Complaint Chief Complaint  Patient presents with  . Palpitations   The history is provided by the patient and medical records. No language interpreter was used.   HPI Comments: Melanie Harris is a 55 y.o. female with PMHx of HTN and Smoking  who presents to the Emergency Department complaining of gradual onset, episodic heart palpitations beginning 2 weeks ago and worsening over this past week. Palpitations are not associated with rest or activity and have no exacerbating or relieving factors. She noticed heart palpitations started to occur around the same time frame as when she increased Amlodipine from 2.5 mg to 10 mg 3 weeks ago. Over the past week, heart palpitations have become more persistant and occur approximately 4x a day with each episode lasting around 10 minutes and then self resolving. She reports associated non-radiating, left sided chest tightness, left upper extremity numbness and weakness as well as shortness of breath. She is unaware of any exacerbating or relieving factors for numbness and weakness. Pt has had relief of chest pain with 2x Aspirin taken twice today. Pt denies recent surgeries, hospitalizations, or recent travel. She denies illegal substance abuse. Denies hx of PE/DVT or notable cardiac hx. Pt smokes approximately 0.5 packs/day. She has 3 cups of coffee daily. Denies fever, chills, nausea, vomiting, neck pain, diaphoresis, leg swelling, blurred vision, difficulty speaking.   Past Medical History:  Diagnosis Date  . Abscess of thigh    right  . Hypertension     Patient Active Problem List   Diagnosis Date Noted  . Other  emphysema ([place]) 08/12/2013  . Cough 08/12/2013  . Smoking 08/12/2013  . Essential hypertension 08/12/2013  . Rash 08/21/2011  . Abscess of thigh   . Abscess of right thigh 07/25/2011    Past Surgical History:  Procedure Laterality Date  . ABDOMINAL HYSTERECTOMY  2004  . TONSILLECTOMY  10-11 yrs old    OB History    No data available       Home Medications    Prior to Admission medications   Medication Sig Start Date End Date Taking? Authorizing Provider  amLODipine (NORVASC) 10 MG tablet Take 1 tablet (10 mg total) by mouth daily. 11/21/15  Yes Arnoldo Morale, MD  aspirin EC 81 MG tablet Take 81 mg by mouth daily.   Yes Historical Provider, MD  ibuprofen (ADVIL,MOTRIN) 200 MG tablet Take 200 mg by mouth every 6 (six) hours as needed for headache.   Yes Historical Provider, MD  Multiple Vitamin (MULTIVITAMIN) capsule Take 1 capsule by mouth daily.   Yes Historical Provider, MD  lisinopril (PRINIVIL,ZESTRIL) 20 MG tablet Take 1 tablet (20 mg total) by mouth daily. Must have office visit for more refills Patient not taking: Reported on 12/10/2015 02/03/15   Tresa Garter, MD  potassium chloride SA (K-DUR,KLOR-CON) 20 MEQ tablet Take 1 tablet (20 mEq total) by mouth daily. 12/11/15   Jarrett Soho Brionne Mertz, PA-C    Family History Family History  Problem Relation Age of Onset  . Heart failure Mother   . Lung cancer Father     Social History Social History  Substance Use Topics  . Smoking status: Current Every Day Smoker  Packs/day: 1.00    Years: 30.00  . Smokeless tobacco: Never Used     Comment: one pack weekly  . Alcohol use 0.6 oz/week    1 Glasses of wine per week     Comment: occassional    Allergies   Doxycycline  Review of Systems Review of Systems  Constitutional: Negative for chills, diaphoresis and fever.  Eyes: Negative for visual disturbance.  Respiratory: Positive for chest tightness and shortness of breath.   Cardiovascular: Positive for chest  pain and palpitations. Negative for leg swelling.  Gastrointestinal: Negative for nausea and vomiting.  Musculoskeletal: Negative for neck pain.  Neurological: Positive for weakness and numbness. Negative for speech difficulty.  All other systems reviewed and are negative.  Physical Exam Updated Vital Signs BP 129/75   Pulse 83   Temp 98.1 F (36.7 C) (Oral)   Resp 13   Ht 5\' 7"  (1.702 m)   Wt 101 lb (45.8 kg)   SpO2 100%   BMI 15.82 kg/m   Physical Exam  Constitutional: She appears well-developed and well-nourished. No distress.  Awake, alert, nontoxic appearance  HENT:  Head: Normocephalic and atraumatic.  Mouth/Throat: Oropharynx is clear and moist. No oropharyngeal exudate.  Eyes: Conjunctivae are normal. No scleral icterus.  Neck: Normal range of motion. Neck supple.  Cardiovascular: Regular rhythm and intact distal pulses.  Tachycardia present.   Pulmonary/Chest: Effort normal and breath sounds normal. No respiratory distress. She has no wheezes.  Equal chest expansion  Abdominal: Soft. Bowel sounds are normal. She exhibits no mass. There is no tenderness. There is no rebound and no guarding.  Musculoskeletal: Normal range of motion. She exhibits no edema.  No calf tenderness  Neurological: She is alert.  Mental Status:  Alert, oriented, thought content appropriate, able to give a coherent history. Speech fluent without evidence of aphasia. Able to follow 2 step commands without difficulty.  Cranial Nerves:  II:  Peripheral visual fields grossly normal, pupils equal, round, reactive to light III,IV, VI: ptosis not present, extra-ocular motions intact bilaterally  V,VII: smile symmetric, facial light touch sensation equal VIII: hearing grossly normal to voice  X: uvula elevates symmetrically  XI: bilateral shoulder shrug symmetric and strong XII: midline tongue extension without fassiculations Motor:  Normal tone. 5/5 in upper and lower extremities bilaterally  including strong and equal grip strength and dorsiflexion/plantar flexion Sensory: Pinprick and light touch normal in all extremities.  Deep Tendon Reflexes: 2+ and symmetric in the biceps and patella Cerebellar: normal finger-to-nose with bilateral upper extremities Gait: normal gait and balance CV: distal pulses palpable throughout   Skin: Skin is warm and dry. She is not diaphoretic.  Psychiatric: She has a normal mood and affect.  Nursing note and vitals reviewed.    ED Treatments / Results  DIAGNOSTIC STUDIES: Oxygen Saturation is 99% on RA, normal by my interpretation.    COORDINATION OF CARE: 9:16 PM Discussed treatment plan with pt at bedside which includes CT Angio and cardiac monitoring and pt agreed to plan.  Labs (all labs ordered are listed, but only abnormal results are displayed) Labs Reviewed  BASIC METABOLIC PANEL - Abnormal; Notable for the following:       Result Value   Potassium 3.0 (*)    Glucose, Bld 113 (*)    All other components within normal limits  CBC  TSH  T4, FREE  I-STAT TROPOININ, ED  Randolm Idol, ED    EKG  EKG Interpretation  Date/Time:  Sunday December 11 2015 00:28:28 EDT Ventricular Rate:  77 PR Interval:    QRS Duration: 80 QT Interval:  395 QTC Calculation: 447 R Axis:   88 Text Interpretation:  Sinus rhythm Confirmed by [facility]  MD, APRIL ([id number]) on 12/11/2015 12:30:33 AM        Radiology Dg Chest 2 View  Result Date: 12/10/2015 CLINICAL DATA:  Chest pain and left arm paresthesias tonight. Palpitations for several days. Recent change in blood pressure medication. EXAM: CHEST  2 VIEW COMPARISON:  08/10/2013 FINDINGS: There is unchanged hyperinflation. The lungs are clear. The pulmonary vasculature is normal. There is no pleural effusion. Heart size is normal. Hilar and mediastinal contours are normal and unchanged. IMPRESSION: Marked hyperinflation.  No acute findings. Electronically Signed   By: Andreas Newport  M.D.   On: 12/10/2015 21:04   Ct Angio Chest Pe W Or Wo Contrast  Result Date: 12/10/2015 CLINICAL DATA:  Subacute onset of palpitations. Left-sided chest tightness and left upper extremity numbness and weakness. Shortness of breath. Initial encounter. EXAM: CT ANGIOGRAPHY CHEST WITH CONTRAST TECHNIQUE: Multidetector CT imaging of the chest was performed using the standard protocol during bolus administration of intravenous contrast. Multiplanar CT image reconstructions and MIPs were obtained to evaluate the vascular anatomy. CONTRAST:  80 mL of Isovue 370 IV contrast COMPARISON:  Chest radiograph performed earlier today at 08:36 p.m. FINDINGS: Cardiovascular:  There is no evidence of pulmonary embolus. The heart is unremarkable in appearance. Minimal atherosclerotic disease is noted along the descending thoracic aorta. The great vessels are unremarkable in appearance. Mediastinum/Nodes: The mediastinum is unremarkable in appearance. No mediastinal lymphadenopathy is seen. No pericardial effusion is identified. There is asymmetric enlargement of the right thyroid lobe. This is suspicious for an underlying nodule. No axillary lymphadenopathy is seen. Lungs/Pleura: A calcified granuloma is noted at the medial right lung base. Minimal scarring is noted at the lung apices. No focal consolidation, pleural effusion or pneumothorax is seen. No masses are identified. Upper Abdomen: The visualized portions of the liver and spleen are grossly unremarkable. Musculoskeletal: No acute osseous abnormalities are identified. The visualized musculature is unremarkable in appearance. Review of the MIP images confirms the above findings. IMPRESSION: 1. No evidence of pulmonary embolus. 2. Minimal scarring at the lung apices. Lungs otherwise grossly clear. 3. **An incidental finding of potential clinical significance has been found. Asymmetric enlargement of the right thyroid lobe, suspicious for an underlying nodule. Consider  further evaluation with thyroid ultrasound. If patient is clinically hyperthyroid, consider nuclear medicine thyroid uptake and scan.** Electronically Signed   By: Garald Balding M.D.   On: 12/10/2015 22:56    Procedures Procedures (including critical care time)  Medications Ordered in ED Medications  sodium chloride 0.9 % bolus 1,000 mL (0 mLs Intravenous Stopped 12/10/15 2315)  potassium chloride SA (K-DUR,KLOR-CON) CR tablet 80 mEq (80 mEq Oral Given 12/10/15 2210)  iopamidol (ISOVUE-370) 76 % injection 100 mL (80 mLs Intravenous Contrast Given 12/10/15 2220)     Initial Impression / Assessment and Plan / ED Course  I have reviewed the triage vital signs and the nursing notes.  Pertinent labs & imaging results that were available during my care of the patient were reviewed by me and considered in my medical decision making (see chart for details).  Clinical Course  Value Comment By Time  Troponin i, poc: 0.00 Repeat troponin negative. Jarrett Soho Adelheid Hoggard, PA-C 11/05 0044   Heart score 3. No hypertension. Afebrile. Tachycardia has resolved. Jarrett Soho Pebbles Zeiders, PA-C 11/05 0045  TSH and free T4 pending Abigail Butts, PA-C 11/05 0045  WBC: 9.2 No leukocytosis. Jarrett Soho Mindy Gali, PA-C 11/05 0046  EKG 12-Lead Nonspecific ST changes resolved with decreasing rate Abigail Butts, PA-C 11/05 0046  CT ANGIO CHEST PE W OR WO CONTRAST No PE. Thyroid nodule noted. Jarrett Soho Olukemi Panchal, PA-C 11/05 0046  DG Chest 2 View No pneumonia. Abigail Butts, PA-C 11/05 [id number]   The patient was discussed with Dr. Leonette Monarch who agrees with the treatment plan.  Abigail Butts, PA-C 11/05 [phone]   Patient with complaints of chest pain. Heart score 3. Patient is low risk. No Evidence of PE on CT scan.  Thyroid nodule noted. TSH and T4 pending. No evidence of thyroid storm.  Patient has been chest pain and palpitation free since 6:30 PM. Initial and troponin are negative. Will have patient continue  her amlodipine and follow-up with primary care in 48 hours. She is to return to the emergency department for worsening symptoms, pain that is constant, has persistent radiation or associated nausea or diaphoresis. She is also return for pain that is exertional or associated shortness of breath. Patient states understanding and is in agreement with the plan.  I personally performed the services described in this documentation, which was scribed in my presence. The recorded information has been reviewed and is accurate.   Final Clinical Impressions(s) / ED Diagnoses   Final diagnoses:  Palpitations  Thyroid nodule  Hypokalemia    New Prescriptions New Prescriptions   POTASSIUM CHLORIDE SA (K-DUR,KLOR-CON) 20 MEQ TABLET    Take 1 tablet (20 mEq total) by mouth daily.     Abigail Butts, PA-C 12/11/15 [id number]    Fatima Blank, MD 12/12/15 1325

## 2015-12-10 NOTE — ED Notes (Signed)
Patient transported to CT 

## 2015-12-11 LAB — TSH: TSH: 1.976 u[IU]/mL (ref 0.350–4.500)

## 2015-12-11 LAB — T4, FREE: Free T4: 1 ng/dL (ref 0.61–1.12)

## 2015-12-11 LAB — I-STAT TROPONIN, ED: TROPONIN I, POC: 0 ng/mL (ref 0.00–0.08)

## 2015-12-11 MED ORDER — POTASSIUM CHLORIDE CRYS ER 20 MEQ PO TBCR: 20.0000 meq | EXTENDED_RELEASE_TABLET | Freq: Every day | ORAL | 0 refills | Status: DC

## 2015-12-11 NOTE — Discharge Instructions (Signed)
1. Medications: usual home medications 2. Treatment: rest, drink plenty of fluids,  3. Follow Up: Please followup with your primary doctor in 2 days for discussion of your diagnoses and further evaluation after today's visit; if you do not have a primary care doctor use the resource guide provided to find one; Please return to the ER for worsening symptoms, return of chest pain, SOB, nausea, vomiting or other concerns

## 2015-12-21 ENCOUNTER — Ambulatory Visit: Payer: Managed Care, Other (non HMO) | Attending: Family Medicine | Admitting: Family Medicine

## 2015-12-21 ENCOUNTER — Encounter: Payer: Self-pay | Admitting: Family Medicine

## 2015-12-21 VITALS — BP 155/73 | HR 104 | Temp 98.3°F | Resp 18 | Ht 67.0 in | Wt 100.8 lb

## 2015-12-21 DIAGNOSIS — R000 Tachycardia, unspecified: Secondary | ICD-10-CM | POA: Insufficient documentation

## 2015-12-21 DIAGNOSIS — Z7982 Long term (current) use of aspirin: Secondary | ICD-10-CM | POA: Insufficient documentation

## 2015-12-21 DIAGNOSIS — E876 Hypokalemia: Secondary | ICD-10-CM | POA: Insufficient documentation

## 2015-12-21 DIAGNOSIS — E041 Nontoxic single thyroid nodule: Secondary | ICD-10-CM

## 2015-12-21 DIAGNOSIS — I10 Essential (primary) hypertension: Secondary | ICD-10-CM

## 2015-12-21 DIAGNOSIS — Z79899 Other long term (current) drug therapy: Secondary | ICD-10-CM | POA: Insufficient documentation

## 2015-12-21 DIAGNOSIS — F411 Generalized anxiety disorder: Secondary | ICD-10-CM | POA: Insufficient documentation

## 2015-12-21 HISTORY — DX: Generalized anxiety disorder: F41.1

## 2015-12-21 LAB — COMPLETE METABOLIC PANEL WITH GFR
ALT: 22 U/L (ref 6–29)
AST: 26 U/L (ref 10–35)
Albumin: 4.9 g/dL (ref 3.6–5.1)
Alkaline Phosphatase: 92 U/L (ref 33–130)
BUN: 9 mg/dL (ref 7–25)
CHLORIDE: 101 mmol/L (ref 98–110)
CO2: 25 mmol/L (ref 20–31)
Calcium: 10.3 mg/dL (ref 8.6–10.4)
Creat: 0.6 mg/dL (ref 0.50–1.05)
GFR, Est African American: >89 mL/min (ref >=60)
GFR, Est Non African American: >89 mL/min (ref >=60)
GLUCOSE: 97 mg/dL (ref 65–99)
POTASSIUM: 4.2 mmol/L (ref 3.5–5.3)
SODIUM: 138 mmol/L (ref 135–146)
Total Bilirubin: 0.4 mg/dL (ref 0.2–1.2)
Total Protein: 7.4 g/dL (ref 6.1–8.1)

## 2015-12-21 MED ORDER — HYDROXYZINE HCL 25 MG PO TABS: 25.0000 mg | ORAL_TABLET | Freq: Three times a day (TID) | ORAL | 1 refills | Status: DC | PRN

## 2015-12-21 MED FILL — hydrOXYzine HCL 25 MG TABS: 25 | 30 days supply | Qty: 90 | Fill #0

## 2015-12-21 NOTE — Patient Instructions (Signed)
Generalized Anxiety Disorder Generalized anxiety disorder (GAD) is a mental disorder. It interferes with life functions, including relationships, work, and school. GAD is different from normal anxiety, which everyone experiences at some point in their lives in response to specific life events and activities. Normal anxiety actually helps us prepare for and get through these life events and activities. Normal anxiety goes away after the event or activity is over.  GAD causes anxiety that is not necessarily related to specific events or activities. It also causes excess anxiety in proportion to specific events or activities. The anxiety associated with GAD is also difficult to control. GAD can vary from mild to severe. People with severe GAD can have intense waves of anxiety with physical symptoms (panic attacks).  SYMPTOMS The anxiety and worry associated with GAD are difficult to control. This anxiety and worry are related to many life events and activities and also occur more days than not for 6 months or longer. People with GAD also have three or more of the following symptoms (one or more in children):  Restlessness.   Fatigue.  Difficulty concentrating.   Irritability.  Muscle tension.  Difficulty sleeping or unsatisfying sleep. DIAGNOSIS GAD is diagnosed through an assessment by your health care provider. Your health care provider will ask you questions aboutyour mood,physical symptoms, and events in your life. Your health care provider may ask you about your medical history and use of alcohol or drugs, including prescription medicines. Your health care provider may also do a physical exam and blood tests. Certain medical conditions and the use of certain substances can cause symptoms similar to those associated with GAD. Your health care provider may refer you to a mental health specialist for further evaluation. TREATMENT The following therapies are usually used to treat GAD:    Medication. Antidepressant medication usually is prescribed for long-term daily control. Antianxiety medicines may be added in severe cases, especially when panic attacks occur.   Talk therapy (psychotherapy). Certain types of talk therapy can be helpful in treating GAD by providing support, education, and guidance. A form of talk therapy called cognitive behavioral therapy can teach you healthy ways to think about and react to daily life events and activities.  Stress managementtechniques. These include yoga, meditation, and exercise and can be very helpful when they are practiced regularly. A mental health specialist can help determine which treatment is best for you. Some people see improvement with one therapy. However, other people require a combination of therapies. This information is not intended to replace advice given to you by your health care provider. Make sure you discuss any questions you have with your health care provider. Document Released: 05/19/2012 Document Revised: 02/12/2014 Document Reviewed: 05/19/2012 Elsevier Interactive Patient Education  2017 [employer] Inc.  

## 2015-12-21 NOTE — Progress Notes (Signed)
Patient is here for HFU  Patient denies pain at this time.  Patient has taken medication today. Patient has not eaten today.  Patient denies suicidal ideations at this time.

## 2015-12-21 NOTE — Progress Notes (Signed)
Subjective:  Patient ID: Melanie Harris, female    DOB: 10-Aug-1960  Age: 55 y.o. MRN: [id number]  CC: Hospitalization Follow-up   HPI Melanie Harris is a 55 year old female with a history of hypertension who presents today for follow-up of an ED visit from 12/10/15 where she had presented with chest palpitations. Cardiac enzymes and thyroid labs were normal, EKG was unremarkable. CT angiogram of the chest was negative for PE but revealed asymmetric enlargement of the right thyroid lobe suspicious for an underlying nodule.   She presents today denying palpitations and her blood pressure is elevated despite compliance with amlodipine. Her hours at work where recently cut and she has lost her insurance as a result. She is currently stressed from all of this and given she has bills to pay.  Labs at the ED revealed hypokalemia of 3.0 and she was given 3 potassium pills which she has completed.  Past Medical History:  Diagnosis Date  . Abscess of thigh    right  . Hypertension     Past Surgical History:  Procedure Laterality Date  . ABDOMINAL HYSTERECTOMY  2004  . TONSILLECTOMY  10-12 yrs old    Past Medical History:  Diagnosis Date  . Abscess of thigh    right  . Hypertension        Outpatient Medications Prior to Visit  Medication Sig Dispense Refill  . amLODipine (NORVASC) 10 MG tablet Take 1 tablet (10 mg total) by mouth daily. 30 tablet 5  . aspirin EC 81 MG tablet Take 81 mg by mouth daily.    Marland Kitchen ibuprofen (ADVIL,MOTRIN) 200 MG tablet Take 200 mg by mouth every 6 (six) hours as needed for headache.    . Multiple Vitamin (MULTIVITAMIN) capsule Take 1 capsule by mouth daily.    . potassium chloride SA (K-DUR,KLOR-CON) 20 MEQ tablet Take 1 tablet (20 mEq total) by mouth daily. (Patient not taking: Reported on 12/21/2015) 3 tablet 0  . lisinopril (PRINIVIL,ZESTRIL) 20 MG tablet Take 1 tablet (20 mg total) by mouth daily. Must have office visit for more refills (Patient  not taking: Reported on 12/10/2015) 30 tablet 0   No facility-administered medications prior to visit.     ROS Review of Systems  Constitutional: Negative for activity change, appetite change and fatigue.  HENT: Negative for congestion, sinus pressure and sore throat.   Eyes: Negative for visual disturbance.  Respiratory: Negative for cough, chest tightness, shortness of breath and wheezing.   Cardiovascular: Negative for chest pain and palpitations.  Gastrointestinal: Negative for abdominal distention, abdominal pain and constipation.  Endocrine: Negative for polydipsia.  Genitourinary: Negative for dysuria and frequency.  Musculoskeletal: Negative for arthralgias and back pain.  Skin: Negative for rash.  Neurological: Negative for tremors, light-headedness and numbness.  Hematological: Does not bruise/bleed easily.  Psychiatric/Behavioral: Negative for agitation and behavioral problems. The patient is nervous/anxious.     Objective:  BP (!) 155/73 (BP Location: Right Arm, Patient Position: Sitting, Cuff Size: Small)   Pulse (!) 104   Temp 98.3 F (36.8 C) (Oral)   Resp 18   Ht 5\' 7"  (1.702 m)   Wt 100 lb 12.8 oz (45.7 kg)   SpO2 98%   BMI 15.79 kg/m   BP/Weight 12/21/2015 12/11/2015 [id number]  Systolic BP 99991111 [id number] -  Diastolic BP 73 76 -  Wt. (Lbs) 100.8 - 101  BMI 15.79 - 15.82      Physical Exam  Constitutional: She is oriented to person, place, and  time. She appears well-developed and well-nourished.  Cardiovascular: Normal heart sounds and intact distal pulses.  Tachycardia present.   No murmur heard. Pulmonary/Chest: Effort normal and breath sounds normal. She has no wheezes. She has no rales. She exhibits no tenderness.  Abdominal: Soft. Bowel sounds are normal. She exhibits no distension and no mass. There is no tenderness.  Musculoskeletal: Normal range of motion.  Neurological: She is alert and oriented to person, place, and time.  Psychiatric: Her mood  appears anxious.    EXAM: CT ANGIOGRAPHY CHEST WITH CONTRAST  TECHNIQUE: Multidetector CT imaging of the chest was performed using the standard protocol during bolus administration of intravenous contrast. Multiplanar CT image reconstructions and MIPs were obtained to evaluate the vascular anatomy.  CONTRAST:  80 mL of Isovue 370 IV contrast  COMPARISON:  Chest radiograph performed earlier today at 08:36 p.m.  FINDINGS: Cardiovascular:  There is no evidence of pulmonary embolus.  The heart is unremarkable in appearance. Minimal atherosclerotic disease is noted along the descending thoracic aorta. The great vessels are unremarkable in appearance.  Mediastinum/Nodes: The mediastinum is unremarkable in appearance. No mediastinal lymphadenopathy is seen. No pericardial effusion is identified. There is asymmetric enlargement of the right thyroid lobe. This is suspicious for an underlying nodule. No axillary lymphadenopathy is seen.  Lungs/Pleura: A calcified granuloma is noted at the medial right lung base. Minimal scarring is noted at the lung apices. No focal consolidation, pleural effusion or pneumothorax is seen. No masses are identified.  Upper Abdomen: The visualized portions of the liver and spleen are grossly unremarkable.  Musculoskeletal: No acute osseous abnormalities are identified. The visualized musculature is unremarkable in appearance.  Review of the MIP images confirms the above findings.  IMPRESSION: 1. No evidence of pulmonary embolus. 2. Minimal scarring at the lung apices. Lungs otherwise grossly clear. 3. **An incidental finding of potential clinical significance has been found. Asymmetric enlargement of the right thyroid lobe, suspicious for an underlying nodule. Consider further evaluation with thyroid ultrasound. If patient is clinically hyperthyroid, consider nuclear medicine thyroid uptake and scan.**   Electronically Signed    By: Garald Balding M.D.   On: 12/10/2015 22:56      Assessment & Plan:   1. Essential hypertension Uncontrolled Likely due to anxiety and current strengths We'll make no changes to her regimen and reassess at next visit. Low-sodium diet. - Lipid panel  2. Tachycardia Thyroid normal could be secondary to underlying anxiety If blood pressure is elevated and she still remains tachycardia next visit we'll place on metoprolol.  3. Anxiety state Due to underlying stressors and recent issues at her job. - hydrOXYzine (ATARAX/VISTARIL) 25 MG tablet; Take 1 tablet (25 mg total) by mouth 3 (three) times daily as needed.  Dispense: 90 tablet; Refill: 1  4. Hypokalemia - COMPLETE METABOLIC PANEL WITH GFR  5. Thyroid nodule We'll need to order thyroid ultrasound at her next visit Advised to obtain cone financial application to facilitate this process   Meds ordered this encounter  Medications  . hydrOXYzine (ATARAX/VISTARIL) 25 MG tablet    Sig: Take 1 tablet (25 mg total) by mouth 3 (three) times daily as needed.    Dispense:  90 tablet    Refill:  1    Follow-up: Return in about 1 month (around 01/20/2016) for Follow-up on thyroid nodule.   Arnoldo Morale MD

## 2015-12-22 ENCOUNTER — Other Ambulatory Visit: Payer: Self-pay | Admitting: Family Medicine

## 2015-12-22 ENCOUNTER — Encounter: Payer: Self-pay | Admitting: Family Medicine

## 2015-12-22 DIAGNOSIS — E7800 Pure hypercholesterolemia, unspecified: Secondary | ICD-10-CM

## 2015-12-22 HISTORY — DX: Pure hypercholesterolemia, unspecified: E78.00

## 2015-12-22 LAB — LIPID PANEL
Cholesterol: 222 mg/dL — ABNORMAL HIGH (ref <200)
HDL: 134 mg/dL (ref >50)
LDL CALC: 74 mg/dL (ref <100)
TRIGLYCERIDES: 72 mg/dL (ref <150)
Total CHOL/HDL Ratio: 1.7 Ratio (ref <5.0)
VLDL: 14 mg/dL (ref <30)

## 2015-12-22 MED ORDER — ATORVASTATIN CALCIUM 20 MG PO TABS: 20.0000 mg | ORAL_TABLET | Freq: Every day | ORAL | 3 refills | Status: DC

## 2015-12-22 MED FILL — ATORVASTATIN 20 MG TABLET: 20 | 30 days supply | Qty: 30 | Fill #0

## 2015-12-26 MED FILL — ?AMLODIPINE BESYLATE 10 MG: 10 | 30 days supply | Qty: 30 | Fill #1

## 2016-01-02 ENCOUNTER — Ambulatory Visit: Payer: Self-pay | Attending: Family Medicine

## 2016-01-18 MED FILL — AMLODIPINE BESYLATE 10 MG T: 10 | 30 days supply | Qty: 30 | Fill #2

## 2016-01-18 MED FILL — ATORVASTATIN 20 MG TABLET: 20 | 30 days supply | Qty: 30 | Fill #1

## 2016-01-20 ENCOUNTER — Ambulatory Visit: Payer: Self-pay | Attending: Family Medicine | Admitting: Family Medicine

## 2016-01-20 ENCOUNTER — Encounter: Payer: Self-pay | Admitting: Family Medicine

## 2016-01-20 VITALS — BP 125/70 | HR 98 | Temp 98.3°F | Ht 67.0 in | Wt 103.6 lb

## 2016-01-20 DIAGNOSIS — E041 Nontoxic single thyroid nodule: Secondary | ICD-10-CM | POA: Insufficient documentation

## 2016-01-20 DIAGNOSIS — Z7982 Long term (current) use of aspirin: Secondary | ICD-10-CM | POA: Insufficient documentation

## 2016-01-20 DIAGNOSIS — Z79899 Other long term (current) drug therapy: Secondary | ICD-10-CM | POA: Insufficient documentation

## 2016-01-20 DIAGNOSIS — I10 Essential (primary) hypertension: Secondary | ICD-10-CM | POA: Insufficient documentation

## 2016-01-20 NOTE — Patient Instructions (Signed)
Hypertension Hypertension, commonly called high blood pressure, is when the force of blood pumping through your arteries is too strong. Your arteries are the blood vessels that carry blood from your heart throughout your body. A blood pressure reading consists of a higher number over a lower number, such as 110/72. The higher number (systolic) is the pressure inside your arteries when your heart pumps. The lower number (diastolic) is the pressure inside your arteries when your heart relaxes. Ideally you want your blood pressure below 120/80. Hypertension forces your heart to work harder to pump blood. Your arteries may become narrow or stiff. Having untreated or uncontrolled hypertension can cause heart attack, stroke, kidney disease, and other problems. What increases the risk? Some risk factors for high blood pressure are controllable. Others are not. Risk factors you cannot control include:  Race. You may be at higher risk if you are African American.  Age. Risk increases with age.  Gender. Men are at higher risk than women before age 45 years. After age 65, women are at higher risk than men. Risk factors you can control include:  Not getting enough exercise or physical activity.  Being overweight.  Getting too much fat, sugar, calories, or salt in your diet.  Drinking too much alcohol. What are the signs or symptoms? Hypertension does not usually cause signs or symptoms. Extremely high blood pressure (hypertensive crisis) may cause headache, anxiety, shortness of breath, and nosebleed. How is this diagnosed? To check if you have hypertension, your health care provider will measure your blood pressure while you are seated, with your arm held at the level of your heart. It should be measured at least twice using the same arm. Certain conditions can cause a difference in blood pressure between your right and left arms. A blood pressure reading that is higher than normal on one occasion does  not mean that you need treatment. If it is not clear whether you have high blood pressure, you may be asked to return on a different day to have your blood pressure checked again. Or, you may be asked to monitor your blood pressure at home for 1 or more weeks. How is this treated? Treating high blood pressure includes making lifestyle changes and possibly taking medicine. Living a healthy lifestyle can help lower high blood pressure. You may need to change some of your habits. Lifestyle changes may include:  Following the DASH diet. This diet is high in fruits, vegetables, and whole grains. It is low in salt, red meat, and added sugars.  Keep your sodium intake below 2,300 mg per day.  Getting at least 30-45 minutes of aerobic exercise at least 4 times per week.  Losing weight if necessary.  Not smoking.  Limiting alcoholic beverages.  Learning ways to reduce stress. Your health care provider may prescribe medicine if lifestyle changes are not enough to get your blood pressure under control, and if one of the following is true:  You are 18-59 years of age and your systolic blood pressure is above 140.  You are 60 years of age or older, and your systolic blood pressure is above 150.  Your diastolic blood pressure is above 90.  You have diabetes, and your systolic blood pressure is over 140 or your diastolic blood pressure is over 90.  You have kidney disease and your blood pressure is above 140/90.  You have heart disease and your blood pressure is above 140/90. Your personal target blood pressure may vary depending on your medical   conditions, your age, and other factors. Follow these instructions at home:  Have your blood pressure rechecked as directed by your health care provider.  Take medicines only as directed by your health care provider. Follow the directions carefully. Blood pressure medicines must be taken as prescribed. The medicine does not work as well when you skip  doses. Skipping doses also puts you at risk for problems.  Do not smoke.  Monitor your blood pressure at home as directed by your health care provider. Contact a health care provider if:  You think you are having a reaction to medicines taken.  You have recurrent headaches or feel dizzy.  You have swelling in your ankles.  You have trouble with your vision. Get help right away if:  You develop a severe headache or confusion.  You have unusual weakness, numbness, or feel faint.  You have severe chest or abdominal pain.  You vomit repeatedly.  You have trouble breathing. This information is not intended to replace advice given to you by your health care provider. Make sure you discuss any questions you have with your health care provider. Document Released: 01/22/2005 Document Revised: 06/30/2015 Document Reviewed: 11/14/2012 Elsevier Interactive Patient Education  2017 [employer]  

## 2016-01-20 NOTE — Progress Notes (Signed)
Subjective:  Patient ID: Melanie Harris, female    DOB: Dec 19, 1960  Age: 55 y.o. MRN: [id number]  CC: Hypertension   HPI Melanie Harris is a 55 year old female with a history of hypertension presents today  For follow-up of an incidental  Finding of a thyroid nodule on a CT Angio of the chest. She had been advised to apply for the colon financial  discountTo facilitate referral for ultrasound. Last TSH was normal. She denies neck pains or neck swelling and has no other concerns at this time.  Past Medical History:  Diagnosis Date  . Abscess of thigh    right  . Hypertension     Past Surgical History:  Procedure Laterality Date  . ABDOMINAL HYSTERECTOMY  2004  . TONSILLECTOMY  10-12 yrs old    Allergies  Allergen Reactions  . Doxycycline Rash     Outpatient Medications Prior to Visit  Medication Sig Dispense Refill  . amLODipine (NORVASC) 10 MG tablet Take 1 tablet (10 mg total) by mouth daily. 30 tablet 5  . aspirin EC 81 MG tablet Take 81 mg by mouth daily.    Marland Kitchen atorvastatin (LIPITOR) 20 MG tablet Take 1 tablet (20 mg total) by mouth daily. 30 tablet 3  . hydrOXYzine (ATARAX/VISTARIL) 25 MG tablet Take 1 tablet (25 mg total) by mouth 3 (three) times daily as needed. 90 tablet 1  . ibuprofen (ADVIL,MOTRIN) 200 MG tablet Take 200 mg by mouth every 6 (six) hours as needed for headache.    . Multiple Vitamin (MULTIVITAMIN) capsule Take 1 capsule by mouth daily.    . potassium chloride SA (K-DUR,KLOR-CON) 20 MEQ tablet Take 1 tablet (20 mEq total) by mouth daily. (Patient not taking: Reported on 12/21/2015) 3 tablet 0   No facility-administered medications prior to visit.     ROS Review of Systems  Constitutional: Negative for activity change and appetite change.  HENT: Negative for sinus pressure and sore throat.   Respiratory: Negative for chest tightness, shortness of breath and wheezing.   Cardiovascular: Negative for chest pain and palpitations.    Gastrointestinal: Negative for abdominal distention, abdominal pain and constipation.  Genitourinary: Negative.   Musculoskeletal: Negative.   Psychiatric/Behavioral: Negative for behavioral problems and dysphoric mood.    Objective:  BP 125/70 (BP Location: Right Arm, Patient Position: Sitting, Cuff Size: Small)   Pulse 98   Temp 98.3 F (36.8 C) (Oral)   Ht 5\' 7"  (1.702 m)   Wt 103 lb 9.6 oz (47 kg)   SpO2 99%   BMI 16.23 kg/m   BP/Weight 01/20/2016 12/21/2015 0000000  Systolic BP 0000000 [id number] [id number]  Diastolic BP 70 73 76  Wt. (Lbs) 103.6 100.8 -  BMI 16.23 15.79 -      Physical Exam  Constitutional: She is oriented to person, place, and time. She appears well-developed and well-nourished.  Cardiovascular: Normal rate, normal heart sounds and intact distal pulses.   No murmur heard. Pulmonary/Chest: Effort normal and breath sounds normal. She has no wheezes. She has no rales. She exhibits no tenderness.  Abdominal: Soft. Bowel sounds are normal. She exhibits no distension and no mass. There is no tenderness.  Musculoskeletal: Normal range of motion.  Neurological: She is alert and oriented to person, place, and time.     Lab Results  Component Value Date   TSH 1.976 12/11/2015    CLINICAL DATA:  Subacute onset of palpitations. Left-sided chest tightness and left upper extremity numbness and weakness. Shortness of breath.  Initial encounter.  EXAM: CT ANGIOGRAPHY CHEST WITH CONTRAST  TECHNIQUE: Multidetector CT imaging of the chest was performed using the standard protocol during bolus administration of intravenous contrast. Multiplanar CT image reconstructions and MIPs were obtained to evaluate the vascular anatomy.  CONTRAST:  80 mL of Isovue 370 IV contrast  COMPARISON:  Chest radiograph performed earlier today at 08:36 p.m.  FINDINGS: Cardiovascular:  There is no evidence of pulmonary embolus.  The heart is unremarkable in appearance. Minimal  atherosclerotic disease is noted along the descending thoracic aorta. The great vessels are unremarkable in appearance.  Mediastinum/Nodes: The mediastinum is unremarkable in appearance. No mediastinal lymphadenopathy is seen. No pericardial effusion is identified. There is asymmetric enlargement of the right thyroid lobe. This is suspicious for an underlying nodule. No axillary lymphadenopathy is seen.  Lungs/Pleura: A calcified granuloma is noted at the medial right lung base. Minimal scarring is noted at the lung apices. No focal consolidation, pleural effusion or pneumothorax is seen. No masses are identified.  Upper Abdomen: The visualized portions of the liver and spleen are grossly unremarkable.  Musculoskeletal: No acute osseous abnormalities are identified. The visualized musculature is unremarkable in appearance.  Review of the MIP images confirms the above findings.  IMPRESSION: 1. No evidence of pulmonary embolus. 2. Minimal scarring at the lung apices. Lungs otherwise grossly clear. 3. **An incidental finding of potential clinical significance has been found. Asymmetric enlargement of the right thyroid lobe, suspicious for an underlying nodule. Consider further evaluation with thyroid ultrasound. If patient is clinically hyperthyroid, consider nuclear medicine thyroid uptake and scan.**   Electronically Signed   By: Garald Balding M.D.   On: 12/10/2015 22:56  Assessment & Plan:   1. Essential hypertension Controlled Continue medications  2. Thyroid nodule Will see back at next visit to follo - US THYROID; Future   No orders of the defined types were placed in this encounter.   Follow-up: Return in about 1 month (around 02/20/2016) for Follow-up on thyroid nodule.   Arnoldo Morale MD

## 2016-01-23 ENCOUNTER — Ambulatory Visit (HOSPITAL_COMMUNITY): Payer: Self-pay

## 2016-02-13 ENCOUNTER — Ambulatory Visit (HOSPITAL_COMMUNITY)
Admission: RE | Admit: 2016-02-13 | Discharge: 2016-02-13 | Disposition: A | Discharge status: Home / Self Care | Payer: Self-pay | Source: Ambulatory Visit | Attending: Family Medicine | Admitting: Family Medicine

## 2016-02-13 DIAGNOSIS — E041 Nontoxic single thyroid nodule: Secondary | ICD-10-CM

## 2016-02-20 MED FILL — hydrOXYzine HCL 25 MG TABS: 25 | 30 days supply | Qty: 90 | Fill #1

## 2016-02-20 MED FILL — AMLODIPINE BESYLATE 10 MG T: 10 | 30 days supply | Qty: 30 | Fill #3

## 2016-02-20 MED FILL — ATORVASTATIN 20 MG TABLET: 20 | 30 days supply | Qty: 30 | Fill #2

## 2016-02-21 ENCOUNTER — Ambulatory Visit: Payer: Self-pay | Attending: Family Medicine | Admitting: Family Medicine

## 2016-02-21 ENCOUNTER — Encounter: Payer: Self-pay | Admitting: Family Medicine

## 2016-02-21 VITALS — BP 147/85 | HR 97 | Temp 97.7°F | Ht 67.0 in | Wt 104.8 lb

## 2016-02-21 DIAGNOSIS — I10 Essential (primary) hypertension: Secondary | ICD-10-CM | POA: Insufficient documentation

## 2016-02-21 DIAGNOSIS — E042 Nontoxic multinodular goiter: Secondary | ICD-10-CM | POA: Insufficient documentation

## 2016-02-21 DIAGNOSIS — E7800 Pure hypercholesterolemia, unspecified: Secondary | ICD-10-CM | POA: Insufficient documentation

## 2016-02-21 DIAGNOSIS — Z79899 Other long term (current) drug therapy: Secondary | ICD-10-CM | POA: Insufficient documentation

## 2016-02-21 DIAGNOSIS — F411 Generalized anxiety disorder: Secondary | ICD-10-CM | POA: Insufficient documentation

## 2016-02-21 DIAGNOSIS — Z7982 Long term (current) use of aspirin: Secondary | ICD-10-CM | POA: Insufficient documentation

## 2016-02-21 MED ORDER — ATORVASTATIN CALCIUM 20 MG PO TABS: 20.0000 mg | ORAL_TABLET | Freq: Every day | ORAL | 5 refills | Status: DC

## 2016-02-21 MED ORDER — AMLODIPINE BESYLATE 10 MG PO TABS: 10.0000 mg | ORAL_TABLET | Freq: Every day | ORAL | 5 refills | Status: DC

## 2016-02-21 MED ORDER — HYDROXYZINE HCL 25 MG PO TABS: 25.0000 mg | ORAL_TABLET | Freq: Three times a day (TID) | ORAL | 3 refills | Status: DC | PRN

## 2016-02-21 NOTE — Progress Notes (Signed)
Subjective:  Patient ID: Melanie Harris, female    DOB: [birth year]  Age: 56 y.o. MRN: [id number]  CC: discussion ([place] results) and Hypertension   HPI Melanie Harris is a 56 year old female with a history of hypertension, hyperlipidemia, anxiety who presents today for follow-up of a thyroid ultrasound due to incidental finding of a red nodule on previous imaging  Ultrasound revealed multiple nodules majority of which do not meet criteria for surveillance of biopsy, right superior neurological which is unchanged from 2010 and most likely benign, previously biopsied right inferior thyroid nodule. Her TSH is normal and she denies heat or cold intolerance, palpitations. She has no other concerns today.  Past Medical History:  Diagnosis Date  . Abscess of thigh    right  . Hypertension     Past Surgical History:  Procedure Laterality Date  . ABDOMINAL HYSTERECTOMY  2004  . TONSILLECTOMY  10-12 yrs old    Allergies  Allergen Reactions  . Doxycycline Rash     Outpatient Medications Prior to Visit  Medication Sig Dispense Refill  . aspirin EC 81 MG tablet Take 81 mg by mouth daily.    Marland Kitchen ibuprofen (ADVIL,MOTRIN) 200 MG tablet Take 200 mg by mouth every 6 (six) hours as needed for headache.    . Multiple Vitamin (MULTIVITAMIN) capsule Take 1 capsule by mouth daily.    Marland Kitchen amLODipine (NORVASC) 10 MG tablet Take 1 tablet (10 mg total) by mouth daily. 30 tablet 5  . atorvastatin (LIPITOR) 20 MG tablet Take 1 tablet (20 mg total) by mouth daily. 30 tablet 3  . hydrOXYzine (ATARAX/VISTARIL) 25 MG tablet Take 1 tablet (25 mg total) by mouth 3 (three) times daily as needed. 90 tablet 1   No facility-administered medications prior to visit.     ROS Review of Systems  Constitutional: Negative for activity change and appetite change.  HENT: Negative for sinus pressure and sore throat.   Respiratory: Negative for chest tightness, shortness of breath and wheezing.   Cardiovascular:  Negative for chest pain and palpitations.  Gastrointestinal: Negative for abdominal distention, abdominal pain and constipation.  Genitourinary: Negative.   Musculoskeletal: Negative.   Psychiatric/Behavioral: Negative for behavioral problems and dysphoric mood.    Objective:  BP (!) 147/85 (BP Location: Right Arm, Patient Position: Sitting, Cuff Size: Small)   Pulse 97   Temp 97.7 F (36.5 C) (Oral)   Ht 5\' 7"  (1.702 m)   Wt 104 lb 12.8 oz (47.5 kg)   SpO2 97%   BMI 16.41 kg/m   BP/Weight 02/21/2016 01/20/2016 0000000  Systolic BP [id number] 0000000 [id number]  Diastolic BP 85 70 73  Wt. (Lbs) 104.8 103.6 100.8  BMI 16.41 16.23 15.79      Physical Exam  Constitutional: She is oriented to person, place, and time. She appears well-developed and well-nourished.  Cardiovascular: Normal rate, normal heart sounds and intact distal pulses.   No murmur heard. Pulmonary/Chest: Effort normal and breath sounds normal. She has no wheezes. She has no rales. She exhibits no tenderness.  Abdominal: Soft. Bowel sounds are normal. She exhibits no distension and no mass. There is no tenderness.  Musculoskeletal: Normal range of motion.  Neurological: She is alert and oriented to person, place, and time.     Lab Results  Component Value Date   TSH 1.976 12/11/2015   Lipid Panel     Component Value Date/Time   CHOL 222 (H) 12/21/2015 1230   TRIG 72 12/21/2015 1230   HDL 134  12/21/2015 1230   CHOLHDL 1.7 12/21/2015 1230   VLDL 14 12/21/2015 1230   LDLCALC 74 12/21/2015 1230   CLINICAL DATA:  56 year old female with a history of multinodular thyroid.  She has had prior biopsy performed of the largest right lower thyroid nodule 03/30/2008  EXAM: THYROID ULTRASOUND  TECHNIQUE: Ultrasound examination of the thyroid gland and adjacent soft tissues was performed.  COMPARISON:  03/30/2008, 03/04/2008  FINDINGS: Parenchymal Echotexture: Moderately heterogenous. Diffusely increased color  flow.  Estimated total number of nodules >/= 1 cm: 4  Number of spongiform nodules >/=  2 cm not described below (TR1): 0  Number of mixed cystic and solid nodules >/= 1.5 cm not described below (TR2): 0  _________________________________________________________  Isthmus: 0.3 cm  No discrete nodules are identified within the thyroid isthmus.  _________________________________________________________  Right lobe: 6.7 cm x 1.8 cm x 2.1 cm  Nodule # 1:  Location: Right; Superior  Size: 1.2 cm x 0.6 cm x 0.8 cm  Composition: solid/almost completely solid (2)  Echogenicity: hypoechoic (2)  Shape: not taller-than-wide (0)  Margins: smooth (0)  Echogenic foci: none (0)  ACR TI-RADS total points: 4.  ACR TI-RADS risk category: TR4 (4-6 points).  ACR TI-RADS recommendations:  Nodule meets criteria for surveillance  Nodule # 2:  Location: Right; Mid  Size: 0.4 cm x 0.4 cm x 0.7 cm  Composition: cannot determine (2)  Echogenicity: hyperechoic (1)  Shape: not taller-than-wide (0)  Margins: cannot determine (0)  Echogenic foci: macrocalcifications (1)  ACR TI-RADS total points: 4.  ACR TI-RADS risk category: TR4 (4-6 points).  ACR TI-RADS recommendations:  Nodule does not meet criteria for surveillance or biopsy  Nodule # 3:  Location: Right; Mid  Size: 1.1 cm x 0.5 cm x 1.0 cm  Composition: spongiform (0)  ACR TI-RADS recommendations:  Spongiform nodule does not meet criteria for surveillance or biopsy  Nodule # 4:  Location: Right; Inferior  Size: 1.3 cm x 0.8 cm x 1.2 cm  Composition: solid/almost completely solid (2)  Echogenicity: isoechoic (1)  Shape: not taller-than-wide (0)  Margins: ill-defined (0)  Echogenic foci: none (0)  ACR TI-RADS total points: 3.  ACR TI-RADS risk category: TR3 (3 points).  ACR TI-RADS recommendations:  Nodule/pseudo nodule does not meet criteria  for surveillance or biopsy  Nodule # 5:  Location: Right; Inferior, previously biopsied  Size: 3.3 cm x 1.5 cm x 2.7 cm  Composition: solid/almost completely solid (2)  Echogenicity: isoechoic (1)  Shape: not taller-than-wide (0)  Margins: ill-defined (0)  Echogenic foci: none (0)  ACR TI-RADS total points: 3.  ACR TI-RADS risk category: TR3 (3 points).  ACR TI-RADS recommendations:  Nodule has been previously biopsied.  _________________________________________________________  Left lobe: 5.1 cm x 1.5 cm x 1.9 cm  Nodule # 1:  Location: Left; Mid  Size: 0.6 cm x 0.5 cm x 0.5 cm  Composition: cannot determine (2)  Echogenicity: cannot determine (1)  Shape: not taller-than-wide (0)  Margins: cannot determine (0)  Echogenic foci: peripheral calcifications (2)  ACR TI-RADS total points: 5.  ACR TI-RADS risk category: TR4 (4-6 points).  ACR TI-RADS recommendations:  Nodule does not meet criteria for surveillance or biopsy  Nodule # 2:  Location: Left; Inferior  Size: 0.5 cm x 0.3 cm x 0.3 cm  Composition: mixed cystic and solid (1)  Echogenicity: isoechoic (1)  Shape: not taller-than-wide (0)  Margins: smooth (0)  Echogenic foci: none (0)  ACR TI-RADS total points: 2.  ACR  TI-RADS risk category: TR2 (2 points).  ACR TI-RADS recommendations:  Nodule does not meet criteria for surveillance or biopsy  IMPRESSION: Heterogeneous thyroid with relatively increased color flow, potentially representing medical thyroid disease.  Multinodular thyroid.  Majority of nodules do not meet criteria for surveillance or biopsy. Although the right superior nodule (labeled 1) meets criteria for surveillance, as designated by the newly established ACR TI-RADS criteria, this nodule has remained unchanged since the study of 2010, and is most likely benign.  Nodule at the right inferior thyroid has been previously  biopsied. Recommend correlation with prior biopsy result.  Recommendations follow those established by the new ACR TI-RADS criteria (J Am Coll Radiol [id number]).  Signed,  Dulcy Fanny. Earleen Newport, DO  [facility]  [facility]   Electronically Signed   By: Corrie Mckusick D.O.   On: 02/13/2016 14:45  Assessment & Plan:   1. Anxiety state Stable - hydrOXYzine (ATARAX/VISTARIL) 25 MG tablet; Take 1 tablet (25 mg total) by mouth 3 (three) times daily as needed.  Dispense: 90 tablet; Refill: 3  2. Essential hypertension Controlled - amLODipine (NORVASC) 10 MG tablet; Take 1 tablet (10 mg total) by mouth daily.  Dispense: 30 tablet; Refill: 5  3. Pure hypercholesterolemia Uncontrolled Low-cholesterol diet - atorvastatin (LIPITOR) 20 MG tablet; Take 1 tablet (20 mg total) by mouth daily.  Dispense: 30 tablet; Refill: 5   Meds ordered this encounter  Medications  . hydrOXYzine (ATARAX/VISTARIL) 25 MG tablet    Sig: Take 1 tablet (25 mg total) by mouth 3 (three) times daily as needed.    Dispense:  90 tablet    Refill:  3  . atorvastatin (LIPITOR) 20 MG tablet    Sig: Take 1 tablet (20 mg total) by mouth daily.    Dispense:  30 tablet    Refill:  5  . amLODipine (NORVASC) 10 MG tablet    Sig: Take 1 tablet (10 mg total) by mouth daily.    Dispense:  30 tablet    Refill:  5    Discontinue 2.5mg     Follow-up: Return in about 3 months (around 05/21/2016) for follow up on chronic medical conditions.   Arnoldo Morale MD

## 2016-03-20 MED FILL — ATORVASTATIN 20 MG TABLET: 20 | 30 days supply | Qty: 30 | Fill #3

## 2016-03-20 MED FILL — AMLODIPINE BESYLATE 10 MG T: 10 | 30 days supply | Qty: 30 | Fill #4

## 2016-04-17 MED FILL — ?AMLODIPINE BESYLATE 10 MG: 10 | 30 days supply | Qty: 30 | Fill #5

## 2016-04-17 MED FILL — ?ATORVASTATIN 20 MG TABLET: 20 | 30 days supply | Qty: 30 | Fill #0

## 2016-05-22 MED FILL — AMLODIPINE BESYLATE 10 MG T: 10 | 30 days supply | Qty: 30 | Fill #0

## 2016-05-22 MED FILL — ?ATORVASTATIN 20 MG TABLET: 20 | 30 days supply | Qty: 30 | Fill #1

## 2016-05-28 ENCOUNTER — Ambulatory Visit: Payer: Self-pay | Attending: Family Medicine | Admitting: Family Medicine

## 2016-05-28 ENCOUNTER — Encounter: Payer: Self-pay | Admitting: Family Medicine

## 2016-05-28 VITALS — BP 148/74 | HR 94 | Temp 98.1°F | Ht 67.0 in | Wt 109.2 lb

## 2016-05-28 DIAGNOSIS — Z114 Encounter for screening for human immunodeficiency virus [HIV]: Secondary | ICD-10-CM | POA: Insufficient documentation

## 2016-05-28 DIAGNOSIS — Z1389 Encounter for screening for other disorder: Secondary | ICD-10-CM | POA: Insufficient documentation

## 2016-05-28 DIAGNOSIS — E7800 Pure hypercholesterolemia, unspecified: Secondary | ICD-10-CM

## 2016-05-28 DIAGNOSIS — M79671 Pain in right foot: Secondary | ICD-10-CM

## 2016-05-28 DIAGNOSIS — Z79899 Other long term (current) drug therapy: Secondary | ICD-10-CM | POA: Insufficient documentation

## 2016-05-28 DIAGNOSIS — Z7982 Long term (current) use of aspirin: Secondary | ICD-10-CM | POA: Insufficient documentation

## 2016-05-28 DIAGNOSIS — I10 Essential (primary) hypertension: Secondary | ICD-10-CM

## 2016-05-28 DIAGNOSIS — Z1159 Encounter for screening for other viral diseases: Secondary | ICD-10-CM

## 2016-05-28 MED ORDER — IBUPROFEN 600 MG PO TABS: 600.0000 mg | ORAL_TABLET | Freq: Two times a day (BID) | ORAL | 3 refills | Status: DC | PRN

## 2016-05-28 MED ORDER — AMLODIPINE BESYLATE 10 MG PO TABS: 10.0000 mg | ORAL_TABLET | Freq: Every day | ORAL | 5 refills | Status: DC

## 2016-05-28 MED ORDER — ATORVASTATIN CALCIUM 20 MG PO TABS: 20.0000 mg | ORAL_TABLET | Freq: Every day | ORAL | 5 refills | Status: DC

## 2016-05-28 MED FILL — IBUPROFEN 600 MG TABLET: 600 | 30 days supply | Qty: 60 | Fill #0

## 2016-05-28 NOTE — Progress Notes (Signed)
Subjective:  Patient ID: Melanie Harris, female    DOB: 1960/07/07  Age: 56 y.o. MRN: [id number]  CC: Hypertension and Foot Pain (right side on the top)   HPI Melanie Harris is a 56 year old female with a history of hypertension, hyperlipidemia who presents today for follow-up visit.   She has been compliant with her antihypertensive and statin as well as low-sodium and low-cholesterol diet. Her blood pressure is slightly elevated. She denies chest pain or shortness of breath. Does not exercise regularly.  She complains of a one-month history of pain on her right dorsum which is worse on weekdays but really doesn't the weekends. 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 Denies swelling or history of trauma.  Past Medical History:  Diagnosis Date  . Abscess of thigh    right  . Hypertension     Past Surgical History:  Procedure Laterality Date  . ABDOMINAL HYSTERECTOMY  2004  . TONSILLECTOMY  10-12 yrs old    Allergies  Allergen Reactions  . Doxycycline Rash     Outpatient Medications Prior to Visit  Medication Sig Dispense Refill  . aspirin EC 81 MG tablet Take 81 mg by mouth daily.    . Multiple Vitamin (MULTIVITAMIN) capsule Take 1 capsule by mouth daily.    Marland Kitchen amLODipine (NORVASC) 10 MG tablet Take 1 tablet (10 mg total) by mouth daily. 30 tablet 5  . atorvastatin (LIPITOR) 20 MG tablet Take 1 tablet (20 mg total) by mouth daily. 30 tablet 5  . ibuprofen (ADVIL,MOTRIN) 200 MG tablet Take 200 mg by mouth every 6 (six) hours as needed for headache.    . hydrOXYzine (ATARAX/VISTARIL) 25 MG tablet Take 1 tablet (25 mg total) by mouth 3 (three) times daily as needed. (Patient not taking: Reported on 05/28/2016) 90 tablet 3   No facility-administered medications prior to visit.     ROS Review of Systems  Constitutional: Negative for activity change, appetite change and fatigue.  HENT: Negative for congestion, sinus pressure and sore throat.     Eyes: Negative for visual disturbance.  Respiratory: Negative for cough, chest tightness, shortness of breath and wheezing.   Cardiovascular: Negative for chest pain and palpitations.  Gastrointestinal: Negative for abdominal distention, abdominal pain and constipation.  Endocrine: Negative for polydipsia.  Genitourinary: Negative for dysuria and frequency.  Musculoskeletal:       See hpi  Skin: Negative for rash.  Neurological: Negative for tremors, light-headedness and numbness.  Hematological: Does not bruise/bleed easily.  Psychiatric/Behavioral: Negative for agitation and behavioral problems.    Objective:  BP (!) 148/74 (BP Location: Right Arm, Patient Position: Sitting, Cuff Size: Small)   Pulse 94   Temp 98.1 F (36.7 C) (Oral)   Ht '5\' 7"'$  (1.702 m)   Wt 109 lb 3.2 oz (49.5 kg)   SpO2 97%   BMI 17.10 kg/m   BP/Weight 05/28/2016 02/21/2016 35/00/9381  Systolic BP 829 937 169  Diastolic BP 74 85 70  Wt. (Lbs) 109.2 104.8 103.6  BMI 17.1 16.41 16.23      Physical Exam  Constitutional: She is oriented to person, place, and time. She appears well-developed and well-nourished.  Cardiovascular: Normal rate, normal heart sounds and intact distal pulses.   No murmur heard. Pulmonary/Chest: Effort normal and breath sounds normal. She has no wheezes. She has no rales. She exhibits no tenderness.  Abdominal: Soft. Bowel sounds are normal. She exhibits no distension and no mass. There is  no tenderness.  Musculoskeletal: Normal range of motion. She exhibits edema ( slight edema of right dorsum) and tenderness (Mild tenderness on palpation of the anterolateral aspect of right foot on the metartarsophalangeal joints).  Neurological: She is alert and oriented to person, place, and time.    CMP Latest Ref Rng & Units 12/21/2015 12/10/2015 04/19/2014  Glucose 65 - 99 mg/dL 97 113(H) 93  BUN 7 - 25 mg/dL _0 Creatinine 0.50 - 1.05 mg/dL 0.60 0.51 0.84  Sodium 135 - 146 mmol/L  138 139 137  Potassium 3.5 - 5.3 mmol/L 4.2 3.0(L) 5.1  Chloride 98 - 110 mmol/L 101 105 101  CO2 20 - 31 mmol/L _1 Calcium 8.6 - 10.4 mg/dL 10.3 9.7 10.0  Total Protein 6.1 - 8.1 g/dL 7.4 - 7.0  Total Bilirubin 0.2 - 1.2 mg/dL 0.4 - 0.5  Alkaline Phos 33 - 130 U/L 92 - 64  AST 10 - 35 U/L 26 - 19  ALT 6 - 29 U/L 22 - 17    Lipid Panel     Component Value Date/Time   CHOL 222 (H) 12/21/2015 1230   TRIG 72 12/21/2015 1230   HDL 134 12/21/2015 1230   CHOLHDL 1.7 12/21/2015 1230   VLDL 14 12/21/2015 1230   LDLCALC 74 12/21/2015 1230    Assessment & Plan:   1. Essential hypertension Slightly above goal of less than 130/80 No regimen change Low-sodium diet - CMP14+EGFR - amLODipine (NORVASC) 10 MG tablet; Take 1 tablet (10 mg total) by mouth daily.  Dispense: 30 tablet; Refill: 5  2. Pure hypercholesterolemia Low cholesterol diet - atorvastatin (LIPITOR) 20 MG tablet; Take 1 tablet (20 mg total) by mouth daily.  Dispense: 30 tablet; Refill: 5  3. Right foot pain Could be due to prolonged standing at her job - ibuprofen (ADVIL,MOTRIN) 600 MG tablet; Take 1 tablet (600 mg total) by mouth every 12 (twelve) hours as needed for headache.  Dispense: 60 tablet; Refill: 3  4. Screening for viral disease - Hepatitis c antibody (reflex) - HIV antibody (with reflex)   Meds ordered this encounter  Medications  . amLODipine (NORVASC) 10 MG tablet    Sig: Take 1 tablet (10 mg total) by mouth daily.    Dispense:  30 tablet    Refill:  5  . atorvastatin (LIPITOR) 20 MG tablet    Sig: Take 1 tablet (20 mg total) by mouth daily.    Dispense:  30 tablet    Refill:  5  . ibuprofen (ADVIL,MOTRIN) 600 MG tablet    Sig: Take 1 tablet (600 mg total) by mouth every 12 (twelve) hours as needed for headache.    Dispense:  60 tablet    Refill:  3    Follow-up: Return in about 3 weeks (around 06/18/2016) for complete physical.   Arnoldo Morale MD

## 2016-05-28 NOTE — Patient Instructions (Signed)
Foot Pain Many things can cause foot pain. Some common causes are:  An injury.  A sprain.  Arthritis.  Blisters.  Bunions. Follow these instructions at home: Pay attention to any changes in your symptoms. Take these actions to help with your discomfort:  If directed, put ice on the affected area:  Put ice in a plastic bag.  Place a towel between your skin and the bag.  Leave the ice on for 15-20 minutes, 3?4 times a day for 2 days.  Take over-the-counter and prescription medicines only as told by your health care provider.  Wear comfortable, supportive shoes that fit you well. Do not wear high heels.  Do not stand or walk for long periods of time.  Do not lift a lot of weight. This can put added pressure on your feet.  Do stretches to relieve foot pain and stiffness as told by your health care provider.  Rub your foot gently.  Keep your feet clean and dry. Contact a health care provider if:  Your pain does not get better after a few days of self-care.  Your pain gets worse.  You cannot stand on your foot. Get help right away if:  Your foot is numb or tingling.  Your foot or toes are swollen.  Your foot or toes turn white or blue.  You have warmth and redness along your foot. This information is not intended to replace advice given to you by your health care provider. Make sure you discuss any questions you have with your health care provider. Document Released: 02/18/2015 Document Revised: 06/30/2015 Document Reviewed: 02/17/2014 Elsevier Interactive Patient Education  2017 [employer].

## 2016-05-29 LAB — HIV ANTIBODY (ROUTINE TESTING W REFLEX): HIV Screen 4th Generation wRfx: NONREACTIVE

## 2016-05-29 LAB — CMP14+EGFR
A/G RATIO: 2 (ref 1.2–2.2)
ALT: 24 IU/L (ref 0–32)
AST: 21 IU/L (ref 0–40)
Albumin: 4.6 g/dL (ref 3.5–5.5)
Alkaline Phosphatase: 106 IU/L (ref 39–117)
BILIRUBIN TOTAL: 0.2 mg/dL (ref 0.0–1.2)
BUN/Creatinine Ratio: 20 (ref 9–23)
BUN: 11 mg/dL (ref 6–24)
CO2: 22 mmol/L (ref 18–29)
Calcium: 9.3 mg/dL (ref 8.7–10.2)
Chloride: 100 mmol/L (ref 96–106)
Creatinine, Ser: 0.54 mg/dL — ABNORMAL LOW (ref 0.57–1.00)
GFR calc non Af Amer: 107 mL/min/{1.73_m2} (ref >59)
GFR, EST AFRICAN AMERICAN: 124 mL/min/{1.73_m2} (ref >59)
GLOBULIN, TOTAL: 2.3 g/dL (ref 1.5–4.5)
Glucose: 92 mg/dL (ref 65–99)
POTASSIUM: 3.5 mmol/L (ref 3.5–5.2)
SODIUM: 140 mmol/L (ref 134–144)
TOTAL PROTEIN: 6.9 g/dL (ref 6.0–8.5)

## 2016-05-29 LAB — HEPATITIS C ANTIBODY (REFLEX): HCV Ab: <0.1 s/co ratio (ref 0.0–0.9)

## 2016-05-29 LAB — HCV COMMENT:

## 2016-06-21 MED FILL — AMLODIPINE BESYLATE 10 MG T: 10 | 30 days supply | Qty: 30 | Fill #1

## 2016-06-21 MED FILL — ?ATORVASTATIN 20 MG TABLET: 20 | 30 days supply | Qty: 30 | Fill #2

## 2016-06-22 ENCOUNTER — Ambulatory Visit: Payer: Medicaid Other | Attending: Family Medicine | Admitting: Family Medicine

## 2016-06-22 ENCOUNTER — Encounter: Payer: Self-pay | Admitting: Family Medicine

## 2016-06-22 VITALS — BP 143/72 | HR 100 | Temp 98.5°F | Resp 18 | Ht 67.0 in | Wt 105.6 lb

## 2016-06-22 DIAGNOSIS — Z881 Allergy status to other antibiotic agents status: Secondary | ICD-10-CM | POA: Insufficient documentation

## 2016-06-22 DIAGNOSIS — Z1239 Encounter for other screening for malignant neoplasm of breast: Secondary | ICD-10-CM

## 2016-06-22 DIAGNOSIS — I10 Essential (primary) hypertension: Secondary | ICD-10-CM | POA: Insufficient documentation

## 2016-06-22 DIAGNOSIS — Z124 Encounter for screening for malignant neoplasm of cervix: Secondary | ICD-10-CM | POA: Insufficient documentation

## 2016-06-22 DIAGNOSIS — Z1211 Encounter for screening for malignant neoplasm of colon: Secondary | ICD-10-CM | POA: Insufficient documentation

## 2016-06-22 DIAGNOSIS — Z0000 Encounter for general adult medical examination without abnormal findings: Secondary | ICD-10-CM

## 2016-06-22 DIAGNOSIS — Z01419 Encounter for gynecological examination (general) (routine) without abnormal findings: Secondary | ICD-10-CM

## 2016-06-22 DIAGNOSIS — Z9889 Other specified postprocedural states: Secondary | ICD-10-CM | POA: Insufficient documentation

## 2016-06-22 DIAGNOSIS — Z90710 Acquired absence of both cervix and uterus: Secondary | ICD-10-CM | POA: Insufficient documentation

## 2016-06-22 DIAGNOSIS — Z7982 Long term (current) use of aspirin: Secondary | ICD-10-CM | POA: Insufficient documentation

## 2016-06-22 NOTE — Patient Instructions (Signed)
Health Maintenance, Female Adopting a healthy lifestyle and getting preventive care can go a long way to promote health and wellness. Talk with your health care provider about what schedule of regular examinations is right for you. This is a good chance for you to check in with your provider about disease prevention and staying healthy. In between checkups, there are plenty of things you can do on your own. Experts have done a lot of research about which lifestyle changes and preventive measures are most likely to keep you healthy. Ask your health care provider for more information. Weight and diet Eat a healthy diet  Be sure to include plenty of vegetables, fruits, low-fat dairy products, and lean protein.  Do not eat a lot of foods high in solid fats, added sugars, or salt.  Get regular exercise. This is one of the most important things you can do for your health.  Most adults should exercise for at least 150 minutes each week. The exercise should increase your heart rate and make you sweat (moderate-intensity exercise).  Most adults should also do strengthening exercises at least twice a week. This is in addition to the moderate-intensity exercise. Maintain a healthy weight  Body mass index (BMI) is a measurement that can be used to identify possible weight problems. It estimates body fat based on height and weight. Your health care provider can help determine your BMI and help you achieve or maintain a healthy weight.  For females 76 years of age and older:  A BMI below 18.5 is considered underweight.  A BMI of 18.5 to 24.9 is normal.  A BMI of 25 to 29.9 is considered overweight.  A BMI of 30 and above is considered obese. Watch levels of cholesterol and blood lipids  You should start having your blood tested for lipids and cholesterol at 55 years of age, then have this test every 5 years.  You may need to have your cholesterol levels checked more often if:  Your lipid or  cholesterol levels are high.  You are older than 55 years of age.  You are at high risk for heart disease. Cancer screening Lung Cancer  Lung cancer screening is recommended for adults 64-42 years old who are at high risk for lung cancer because of a history of smoking.  A yearly low-dose CT scan of the lungs is recommended for people who:  Currently smoke.  Have quit within the past 15 years.  Have at least a 30-pack-year history of smoking. A pack year is smoking an average of one pack of cigarettes a day for 1 year.  Yearly screening should continue until it has been 15 years since you quit.  Yearly screening should stop if you develop a health problem that would prevent you from having lung cancer treatment. Breast Cancer  Practice breast self-awareness. This means understanding how your breasts normally appear and feel.  It also means doing regular breast self-exams. Let your health care provider know about any changes, no matter how small.  If you are in your 20s or 30s, you should have a clinical breast exam (CBE) by a health care provider every 1-3 years as part of a regular health exam.  If you are 34 or older, have a CBE every year. Also consider having a breast X-ray (mammogram) every year.  If you have a family history of breast cancer, talk to your health care provider about genetic screening.  If you are at high risk for breast cancer, talk  to your health care provider about having an MRI and a mammogram every year.  Breast cancer gene (BRCA) assessment is recommended for women who have family members with BRCA-related cancers. BRCA-related cancers include:  Breast.  Ovarian.  Tubal.  Peritoneal cancers.  Results of the assessment will determine the need for genetic counseling and BRCA1 and BRCA2 testing. Cervical Cancer  Your health care provider may recommend that you be screened regularly for cancer of the pelvic organs (ovaries, uterus, and vagina).  This screening involves a pelvic examination, including checking for microscopic changes to the surface of your cervix (Pap test). You may be encouraged to have this screening done every 3 years, beginning at age 24.  For women ages 66-65, health care providers may recommend pelvic exams and Pap testing every 3 years, or they may recommend the Pap and pelvic exam, combined with testing for human papilloma virus (HPV), every 5 years. Some types of HPV increase your risk of cervical cancer. Testing for HPV may also be done on women of any age with unclear Pap test results.  Other health care providers may not recommend any screening for nonpregnant women who are considered low risk for pelvic cancer and who do not have symptoms. Ask your health care provider if a screening pelvic exam is right for you.  If you have had past treatment for cervical cancer or a condition that could lead to cancer, you need Pap tests and screening for cancer for at least 20 years after your treatment. If Pap tests have been discontinued, your risk factors (such as having a new sexual partner) need to be reassessed to determine if screening should resume. Some women have medical problems that increase the chance of getting cervical cancer. In these cases, your health care provider may recommend more frequent screening and Pap tests. Colorectal Cancer  This type of cancer can be detected and often prevented.  Routine colorectal cancer screening usually begins at 55 years of age and continues through 55 years of age.  Your health care provider may recommend screening at an earlier age if you have risk factors for colon cancer.  Your health care provider may also recommend using home test kits to check for hidden blood in the stool.  A small camera at the end of a tube can be used to examine your colon directly (sigmoidoscopy or colonoscopy). This is done to check for the earliest forms of colorectal cancer.  Routine  screening usually begins at age 41.  Direct examination of the colon should be repeated every 5-10 years through 55 years of age. However, you may need to be screened more often if early forms of precancerous polyps or small growths are found. Skin Cancer  Check your skin from head to toe regularly.  Tell your health care provider about any new moles or changes in moles, especially if there is a change in a mole's shape or color.  Also tell your health care provider if you have a mole that is larger than the size of a pencil eraser.  Always use sunscreen. Apply sunscreen liberally and repeatedly throughout the day.  Protect yourself by wearing long sleeves, pants, a wide-brimmed hat, and sunglasses whenever you are outside. Heart disease, diabetes, and high blood pressure  High blood pressure causes heart disease and increases the risk of stroke. High blood pressure is more likely to develop in:  People who have blood pressure in the high end of the normal range (130-139/85-89 mm Hg).  People who are overweight or obese.  People who are African American.  If you are 59-24 years of age, have your blood pressure checked every 3-5 years. If you are 34 years of age or older, have your blood pressure checked every year. You should have your blood pressure measured twice-once when you are at a hospital or clinic, and once when you are not at a hospital or clinic. Record the average of the two measurements. To check your blood pressure when you are not at a hospital or clinic, you can use:  An automated blood pressure machine at a pharmacy.  A home blood pressure monitor.  If you are between 29 years and 60 years old, ask your health care provider if you should take aspirin to prevent strokes.  Have regular diabetes screenings. This involves taking a blood sample to check your fasting blood sugar level.  If you are at a normal weight and have a low risk for diabetes, have this test once  every three years after 55 years of age.  If you are overweight and have a high risk for diabetes, consider being tested at a younger age or more often. Preventing infection Hepatitis B  If you have a higher risk for hepatitis B, you should be screened for this virus. You are considered at high risk for hepatitis B if:  You were born in a country where hepatitis B is common. Ask your health care provider which countries are considered high risk.  Your parents were born in a high-risk country, and you have not been immunized against hepatitis B (hepatitis B vaccine).  You have HIV or AIDS.  You use needles to inject street drugs.  You live with someone who has hepatitis B.  You have had sex with someone who has hepatitis B.  You get hemodialysis treatment.  You take certain medicines for conditions, including cancer, organ transplantation, and autoimmune conditions. Hepatitis C  Blood testing is recommended for:  Everyone born from [birth year] through 1964.  Anyone with known risk factors for hepatitis C. Sexually transmitted infections (STIs)  You should be screened for sexually transmitted infections (STIs) including gonorrhea and chlamydia if:  You are sexually active and are younger than 55 years of age.  You are older than 55 years of age and your health care provider tells you that you are at risk for this type of infection.  Your sexual activity has changed since you were last screened and you are at an increased risk for chlamydia or gonorrhea. Ask your health care provider if you are at risk.  If you do not have HIV, but are at risk, it may be recommended that you take a prescription medicine daily to prevent HIV infection. This is called pre-exposure prophylaxis (PrEP). You are considered at risk if:  You are sexually active and do not regularly use condoms or know the HIV status of your partner(s).  You take drugs by injection.  You are sexually active with a partner  who has HIV. Talk with your health care provider about whether you are at high risk of being infected with HIV. If you choose to begin PrEP, you should first be tested for HIV. You should then be tested every 3 months for as long as you are taking PrEP. Pregnancy  If you are premenopausal and you may become pregnant, ask your health care provider about preconception counseling.  If you may become pregnant, take 400 to 800 micrograms (mcg) of folic acid  every day.  If you want to prevent pregnancy, talk to your health care provider about birth control (contraception). Osteoporosis and menopause  Osteoporosis is a disease in which the bones lose minerals and strength with aging. This can result in serious bone fractures. Your risk for osteoporosis can be identified using a bone density scan.  If you are 4 years of age or older, or if you are at risk for osteoporosis and fractures, ask your health care provider if you should be screened.  Ask your health care provider whether you should take a calcium or vitamin D supplement to lower your risk for osteoporosis.  Menopause may have certain physical symptoms and risks.  Hormone replacement therapy may reduce some of these symptoms and risks. Talk to your health care provider about whether hormone replacement therapy is right for you. Follow these instructions at home:  Schedule regular health, dental, and eye exams.  Stay current with your immunizations.  Do not use any tobacco products including cigarettes, chewing tobacco, or electronic cigarettes.  If you are pregnant, do not drink alcohol.  If you are breastfeeding, limit how much and how often you drink alcohol.  Limit alcohol intake to no more than 1 drink per day for nonpregnant women. One drink equals 12 ounces of beer, 5 ounces of wine, or 1 ounces of hard liquor.  Do not use street drugs.  Do not share needles.  Ask your health care provider for help if you need support  or information about quitting drugs.  Tell your health care provider if you often feel depressed.  Tell your health care provider if you have ever been abused or do not feel safe at home. This information is not intended to replace advice given to you by your health care provider. Make sure you discuss any questions you have with your health care provider. Document Released: 08/07/2010 Document Revised: 06/30/2015 Document Reviewed: 10/26/2014 Elsevier Interactive Patient Education  2017 [employer].

## 2016-06-22 NOTE — Progress Notes (Signed)
Patient is here for Physical  Patient denies pain at this time.  Patient has taken medication today. Patient has eaten today.

## 2016-06-22 NOTE — Progress Notes (Signed)
Subjective:  Patient ID: Melanie Harris, female    DOB: 03/10/1960  Age: 56 y.o. MRN: [id number]  CC: Annual Exam   HPI Melanie Harris presents for a complete physical exam. She is status post hysterectomy for fibroid; pathology was negative for malignancy as per patient.   Past Medical History:  Diagnosis Date  . Abscess of thigh    right  . Hypertension     Past Surgical History:  Procedure Laterality Date  . ABDOMINAL HYSTERECTOMY  2004  . TONSILLECTOMY  10-11 yrs old    Allergies  Allergen Reactions  . Doxycycline Rash     Outpatient Medications Prior to Visit  Medication Sig Dispense Refill  . amLODipine (NORVASC) 10 MG tablet Take 1 tablet (10 mg total) by mouth daily. 30 tablet 5  . aspirin EC 81 MG tablet Take 81 mg by mouth daily.    Marland Kitchen atorvastatin (LIPITOR) 20 MG tablet Take 1 tablet (20 mg total) by mouth daily. 30 tablet 5  . ibuprofen (ADVIL,MOTRIN) 600 MG tablet Take 1 tablet (600 mg total) by mouth every 12 (twelve) hours as needed for headache. 60 tablet 3  . Multiple Vitamin (MULTIVITAMIN) capsule Take 1 capsule by mouth daily.     No facility-administered medications prior to visit.     ROS Review of Systems  Constitutional: Negative for activity change, appetite change and fatigue.  HENT: Negative for congestion, sinus pressure and sore throat.   Eyes: Negative for visual disturbance.  Respiratory: Negative for cough, chest tightness, shortness of breath and wheezing.   Cardiovascular: Negative for chest pain and palpitations.  Gastrointestinal: Negative for abdominal distention, abdominal pain and constipation.  Endocrine: Negative for polydipsia.  Genitourinary: Negative for dysuria and frequency.  Musculoskeletal: Negative for arthralgias and back pain.  Skin: Negative for rash.  Neurological: Negative for tremors, light-headedness and numbness.  Hematological: Does not bruise/bleed easily.  Psychiatric/Behavioral: Negative for  agitation and behavioral problems.    Objective:  BP (!) 143/72 (BP Location: Left Arm, Patient Position: Sitting, Cuff Size: Small)   Pulse 100   Temp 98.5 F (36.9 C) (Oral)   Resp 18   Ht 5\' 7"  (1.702 m)   Wt 105 lb 9.6 oz (47.9 kg)   SpO2 97%   BMI 16.54 kg/m   BP/Weight 06/22/2016 05/28/2016 0/53/9767  Systolic BP 341 937 902  Diastolic BP 72 74 85  Wt. (Lbs) 105.6 109.2 104.8  BMI 16.54 17.1 16.41      Physical Exam  Constitutional: She is oriented to person, place, and time. She appears well-developed and well-nourished.  Cardiovascular: Normal rate, normal heart sounds and intact distal pulses.   No murmur heard. Pulmonary/Chest: Effort normal and breath sounds normal. She has no wheezes. She has no rales. She exhibits no tenderness. Right breast exhibits no mass, no nipple discharge and no tenderness. Left breast exhibits no mass, no nipple discharge and no tenderness.  Abdominal: Soft. Bowel sounds are normal. She exhibits no distension and no mass. There is no tenderness.  Musculoskeletal: Normal range of motion.  Neurological: She is alert and oriented to person, place, and time.  Skin: Skin is warm and dry.  Psychiatric: She has a normal mood and affect.     Assessment & Plan:   1. Annual physical exam  2. Screening for breast cancer - MM Digital Screening; Future  3. Screening for cervical cancer Status post hysterectomy She will not need Pap smears as hysterectomy was secondary to benign cause -  Cytology - PAP [place]  4. Screening for colon cancer - Ambulatory referral to Gastroenterology   No orders of the defined types were placed in this encounter.   Follow-up: Return in about 5 months (around 11/22/2016) for follow up of Hypertension and Hypercholesterolemia.   Arnoldo Morale MD

## 2016-06-27 ENCOUNTER — Ambulatory Visit: Payer: Self-pay | Admitting: Family Medicine

## 2016-07-03 ENCOUNTER — Other Ambulatory Visit: Payer: Self-pay | Admitting: Family Medicine

## 2016-07-03 DIAGNOSIS — R87612 Low grade squamous intraepithelial lesion on cytologic smear of cervix (LGSIL): Secondary | ICD-10-CM

## 2016-07-03 HISTORY — DX: Low grade squamous intraepithelial lesion on cytologic smear of cervix (LGSIL): R87.612

## 2016-07-03 LAB — CYTOLOGY - PAP

## 2016-07-19 ENCOUNTER — Ambulatory Visit: Payer: Medicaid Other | Attending: Family Medicine

## 2016-07-24 MED FILL — AMLODIPINE BESYLATE 10 MG T: 10 | 30 days supply | Qty: 30 | Fill #2

## 2016-07-24 MED FILL — ATORVASTATIN 20 MG TABLET: 20 | 30 days supply | Qty: 30 | Fill #3

## 2016-08-20 ENCOUNTER — Ambulatory Visit: Payer: Self-pay

## 2016-08-22 MED FILL — ATORVASTATIN 20 MG TABLET: 20 | 30 days supply | Qty: 30 | Fill #4

## 2016-08-22 MED FILL — ?AMLODIPINE BESYLATE 10 MG: 10 | 30 days supply | Qty: 30 | Fill #3

## 2016-08-31 ENCOUNTER — Ambulatory Visit: Payer: Medicaid Other | Admitting: Obstetrics & Gynecology

## 2016-09-03 ENCOUNTER — Other Ambulatory Visit: Payer: Self-pay | Admitting: Obstetrics and Gynecology

## 2016-09-03 DIAGNOSIS — Z1231 Encounter for screening mammogram for malignant neoplasm of breast: Secondary | ICD-10-CM

## 2016-09-18 ENCOUNTER — Encounter (HOSPITAL_COMMUNITY): Payer: Self-pay

## 2016-09-18 ENCOUNTER — Ambulatory Visit (HOSPITAL_COMMUNITY)
Admission: RE | Admit: 2016-09-18 | Discharge: 2016-09-18 | Disposition: A | Discharge status: Home / Self Care | Payer: Self-pay | Source: Ambulatory Visit | Attending: Obstetrics and Gynecology | Admitting: Obstetrics and Gynecology

## 2016-09-18 ENCOUNTER — Ambulatory Visit
Admission: RE | Admit: 2016-09-18 | Discharge: 2016-09-18 | Disposition: A | Discharge status: Home / Self Care | Payer: Self-pay | Source: Ambulatory Visit | Attending: Obstetrics and Gynecology | Admitting: Obstetrics and Gynecology

## 2016-09-18 VITALS — BP 134/77 | HR 102 | Temp 98.2°F | Ht 67.0 in | Wt 103.1 lb

## 2016-09-18 DIAGNOSIS — Z1231 Encounter for screening mammogram for malignant neoplasm of breast: Secondary | ICD-10-CM

## 2016-09-18 DIAGNOSIS — R87622 Low grade squamous intraepithelial lesion on cytologic smear of vagina (LGSIL): Secondary | ICD-10-CM

## 2016-09-18 DIAGNOSIS — Z1239 Encounter for other screening for malignant neoplasm of breast: Secondary | ICD-10-CM

## 2016-09-18 NOTE — Patient Instructions (Signed)
Explained breast self awareness with Melanie Harris. Patient did not need a Pap smear today due to last Pap smear was 06/22/2016. Explained the colposcopy the recommended follow up for her abnormal Pap smear. Patient referred to the [facility] [place] at  [facility] for a colpscopy. Appointment scheduled for Tuesday, September 25, 2016 at 1520. Patient aware of appointment and will be there. Discussed smoking cessation with patient. Referred patient to the [facility] Quitline and gave resources to free smoking cessation classes offered at [facility]. Melanie Harris verbalized understanding.  [place], Arvil Chaco, RN 4:09 PM

## 2016-09-18 NOTE — Progress Notes (Addendum)
Patient referred to [facility] by [facility] due to having an abnormal Pap smear 06/22/2016 that a colposcopy is recommended for follow up.  Pap Smear: Pap smear not completed today. Last Pap smear was 06/22/2016 at [facility] and [facility]. Patient referred to the [facility] [place] at  [facility] for a colpscopy. Appointment scheduled for Tuesday, September 25, 2016 at 1520. Per patient has no history of an abnormal Pap smear prior to the most recent Pap smear. Patient has a history of a hysterectomy 11/28/2000 for fibroids. Last Pap smear result is in EPIC.  Physical exam: Breasts Breasts symmetrical. No skin abnormalities bilateral breasts. No nipple retraction bilateral breasts. No nipple discharge bilateral breasts. No lymphadenopathy. No lumps palpated bilateral breasts. No complaints of pain or tenderness on exam. Referred patient to the [facility] for a screening mammogram. Appointment scheduled for Tuesday, September 18, 2016 at 1610.        Pelvic/Bimanual No Pap smear completed today since last Pap smear was 06/22/2016. Pap smear not indicated per BCCCP guidelines.   Smoking History: Patient is a current smoker. Discussed smoking cessation with patient. Referred patient to the [facility] Quitline and gave resources to free smoking cessation classes offered at [facility].  Patient Navigation: Patient education provided. Access to services provided for patient through [facility] program.   Colorectal Cancer Screening: Per patient has never had a colonoscopy completed. No complaints today. FIT Test given to patient to complete and return to B[facility]CP.

## 2016-09-21 ENCOUNTER — Other Ambulatory Visit: Payer: Self-pay | Admitting: Family Medicine

## 2016-09-21 DIAGNOSIS — E7800 Pure hypercholesterolemia, unspecified: Secondary | ICD-10-CM

## 2016-09-21 MED FILL — ?ATORVASTATIN 20 MG TABLET: 20 | 30 days supply | Qty: 30 | Fill #5

## 2016-09-21 MED FILL — AMLODIPINE BESYLATE 10 MG T: 10 | 30 days supply | Qty: 30 | Fill #4

## 2016-09-25 ENCOUNTER — Ambulatory Visit (INDEPENDENT_AMBULATORY_CARE_PROVIDER_SITE_OTHER): Payer: No Typology Code available for payment source | Admitting: Obstetrics and Gynecology

## 2016-09-25 DIAGNOSIS — R87612 Low grade squamous intraepithelial lesion on cytologic smear of cervix (LGSIL): Secondary | ICD-10-CM

## 2016-09-25 NOTE — Progress Notes (Signed)
[facility] 09/25/2016  CC: abnormal vaginal pap   Subjective:   No PMB or spotting. Had pap at her annual in 06/2016 and it was [facility]IL/no hpv testing done. 2002 surg path from [facility] (done for benign indications) was negative.   Review of Systems Pertinent items are noted in HPI.   Objective:   NAD  Assessment:   Pt stable  Plan:   Given history, recommend repeat pap and to do HPV testing in May 2019. If ASCUS and/or high risk HPV positive at that time, recommend colposcopy   Encouraged tobacco cessation.    Durene Romans MD Attending [facility] (Faculty Practice) 09/25/2016 Time: [phone]

## 2016-09-25 NOTE — Addendum Note (Signed)
Encounter addended by: [name], RN on: 09/25/2016  3:39 PM<BR>    Actions taken: Sign clinical note

## 2016-09-25 NOTE — Addendum Note (Signed)
Addended by: Aletha Halim on: 09/25/2016 03:38 PM   Modules accepted: Level of Service

## 2016-10-10 ENCOUNTER — Encounter: Payer: Self-pay | Admitting: Family Medicine

## 2016-10-10 ENCOUNTER — Ambulatory Visit: Payer: Self-pay | Attending: Family Medicine | Admitting: Family Medicine

## 2016-10-10 VITALS — BP 136/68 | HR 97 | Temp 98.1°F | Ht 67.0 in | Wt 102.0 lb

## 2016-10-10 DIAGNOSIS — F17200 Nicotine dependence, unspecified, uncomplicated: Secondary | ICD-10-CM

## 2016-10-10 DIAGNOSIS — F17210 Nicotine dependence, cigarettes, uncomplicated: Secondary | ICD-10-CM | POA: Insufficient documentation

## 2016-10-10 DIAGNOSIS — E7800 Pure hypercholesterolemia, unspecified: Secondary | ICD-10-CM

## 2016-10-10 DIAGNOSIS — Z7982 Long term (current) use of aspirin: Secondary | ICD-10-CM | POA: Insufficient documentation

## 2016-10-10 DIAGNOSIS — Z79899 Other long term (current) drug therapy: Secondary | ICD-10-CM | POA: Insufficient documentation

## 2016-10-10 DIAGNOSIS — Z90710 Acquired absence of both cervix and uterus: Secondary | ICD-10-CM | POA: Insufficient documentation

## 2016-10-10 DIAGNOSIS — Z0000 Encounter for general adult medical examination without abnormal findings: Secondary | ICD-10-CM

## 2016-10-10 DIAGNOSIS — I10 Essential (primary) hypertension: Secondary | ICD-10-CM

## 2016-10-10 MED ORDER — ATORVASTATIN CALCIUM 20 MG PO TABS: 20.0000 mg | ORAL_TABLET | Freq: Every day | ORAL | 6 refills | Status: DC

## 2016-10-10 MED ORDER — AMLODIPINE BESYLATE 10 MG PO TABS: 10.0000 mg | ORAL_TABLET | Freq: Every day | ORAL | 6 refills | Status: DC

## 2016-10-10 MED ORDER — BUPROPION HCL ER (SMOKING DET) 150 MG PO TB12: 150.0000 mg | ORAL_TABLET | Freq: Two times a day (BID) | ORAL | 3 refills | Status: DC

## 2016-10-10 NOTE — Progress Notes (Signed)
Subjective:  Patient ID: Melanie Harris, female    DOB: 1960-11-25  Age: 56 y.o. MRN: [id number]  CC: Hypertension   HPI Melanie Harris  is a 56 year old female with a history of hypertension, hyperlipidemia who presents today for follow-up visit.   She continues to smoke about 7-8 cigarettes per day and is interested in quitting.  She had a mammogram last month which was negative for malignancy; Pap smear revealed LGSIL for which she was seen by GYN and repeat recommended in 1 year (of note she is status post hysterectomy) Referred for colonoscopy which she never had; she informs me she was given the cologuard which she has not mailed in.  She has been compliant with her antihypertensive and statin as well as low-sodium and low-cholesterol diet.  She denies chest pain or shortness of breath. Does not exercise regularly.   Past Medical History:  Diagnosis Date  . Abscess of thigh    right  . Hypertension   ,h  Past Surgical History:  Procedure Laterality Date  . ABDOMINAL HYSTERECTOMY  2004  . TONSILLECTOMY  10-12 yrs old    Allergies  Allergen Reactions  . Doxycycline Rash     Outpatient Medications Prior to Visit  Medication Sig Dispense Refill  . aspirin EC 81 MG tablet Take 81 mg by mouth daily.    Marland Kitchen ibuprofen (ADVIL,MOTRIN) 600 MG tablet Take 1 tablet (600 mg total) by mouth every 12 (twelve) hours as needed for headache. 60 tablet 3  . Multiple Vitamin (MULTIVITAMIN) capsule Take 1 capsule by mouth daily.    Marland Kitchen amLODipine (NORVASC) 10 MG tablet Take 1 tablet (10 mg total) by mouth daily. 30 tablet 5  . atorvastatin (LIPITOR) 20 MG tablet Take 1 tablet (20 mg total) by mouth daily. 30 tablet 5   No facility-administered medications prior to visit.     ROS Review of Systems  Constitutional: Negative for activity change, appetite change and fatigue.  HENT: Negative for congestion, sinus pressure and sore throat.   Eyes: Negative for visual disturbance.    Respiratory: Negative for cough, chest tightness, shortness of breath and wheezing.   Cardiovascular: Negative for chest pain and palpitations.  Gastrointestinal: Negative for abdominal distention, abdominal pain and constipation.  Endocrine: Negative for polydipsia.  Genitourinary: Negative for dysuria and frequency.  Musculoskeletal: Negative for arthralgias and back pain.  Skin: Negative for rash.  Neurological: Negative for tremors, light-headedness and numbness.  Hematological: Does not bruise/bleed easily.  Psychiatric/Behavioral: Negative for agitation and behavioral problems.    Objective:  BP 136/68   Pulse 97   Temp 98.1 F (36.7 C) (Oral)   Ht 5' 7"  (1.702 m)   Wt 102 lb (46.3 kg)   SpO2 98%   BMI 15.98 kg/m   BP/Weight 10/10/2016 09/18/2016 08/15/6267  Systolic BP 485 462 703  Diastolic BP 68 77 72  Wt. (Lbs) 102 103.13 105.6  BMI 15.98 16.15 16.54      Physical Exam  Constitutional: She is oriented to person, place, and time. She appears well-developed and well-nourished.  Cardiovascular: Normal rate, normal heart sounds and intact distal pulses.   No murmur heard. Pulmonary/Chest: Effort normal and breath sounds normal. She has no wheezes. She has no rales. She exhibits no tenderness.  Abdominal: Soft. Bowel sounds are normal. She exhibits no distension and no mass. There is no tenderness.  Musculoskeletal: Normal range of motion.  Neurological: She is alert and oriented to person, place, and time.  Skin:  Skin is warm and dry.  Psychiatric: She has a normal mood and affect.      CMP Latest Ref Rng & Units 05/28/2016 12/21/2015 12/10/2015  Glucose 65 - 99 mg/dL 92 97 113(H)  BUN 6 - 24 mg/dL 11 9 14   Creatinine 0.57 - 1.00 mg/dL 0.54(L) 0.60 0.51  Sodium 134 - 144 mmol/L 140 138 139  Potassium 3.5 - 5.2 mmol/L 3.5 4.2 3.0(L)  Chloride 96 - 106 mmol/L 100 101 105  CO2 18 - 29 mmol/L 22 25 24   Calcium 8.7 - 10.2 mg/dL 9.3 10.3 9.7  Total Protein 6.0 - 8.5  g/dL 6.9 7.4 -  Total Bilirubin 0.0 - 1.2 mg/dL 0.2 0.4 -  Alkaline Phos 39 - 117 IU/L 106 92 -  AST 0 - 40 IU/L 21 26 -  ALT 0 - 32 IU/L 24 22 -    Lipid Panel     Component Value Date/Time   CHOL 222 (H) 12/21/2015 1230   TRIG 72 12/21/2015 1230   HDL 134 12/21/2015 1230   CHOLHDL 1.7 12/21/2015 1230   VLDL 14 12/21/2015 1230   LDLCALC 74 12/21/2015 1230    Assessment & Plan:   1. Essential hypertension Controlled Low-sodium diet - Lipid panel; Future - CMP14+EGFR; Future - amLODipine (NORVASC) 10 MG tablet; Take 1 tablet (10 mg total) by mouth daily.  Dispense: 30 tablet; Refill: 6  2. Pure hypercholesterolemia Uncontrolled Lipid panel and will adjust Lipitor dose accordingly - atorvastatin (LIPITOR) 20 MG tablet; Take 1 tablet (20 mg total) by mouth daily.  Dispense: 30 tablet; Refill: 6  3. Smoking Currently interested in quitting smoking We'll try Wellbutrin - buPROPion (ZYBAN) 150 MG 12 hr tablet; Take 1 tablet (150 mg total) by mouth 2 (two) times daily. For smoking cessation  Dispense: 60 tablet; Refill: 3   4. Healthcare maintenance Colonoscopy pending Patient encouraged to send in cologuard test since colonoscopy is pending   Meds ordered this encounter  Medications  . amLODipine (NORVASC) 10 MG tablet    Sig: Take 1 tablet (10 mg total) by mouth daily.    Dispense:  30 tablet    Refill:  6  . atorvastatin (LIPITOR) 20 MG tablet    Sig: Take 1 tablet (20 mg total) by mouth daily.    Dispense:  30 tablet    Refill:  6  . buPROPion (ZYBAN) 150 MG 12 hr tablet    Sig: Take 1 tablet (150 mg total) by mouth 2 (two) times daily. For smoking cessation    Dispense:  60 tablet    Refill:  3    Follow-up: Return in about 6 months (around 04/09/2017) for Follow-up of chronic medical conditions.   Arnoldo Morale MD

## 2016-10-10 NOTE — Patient Instructions (Signed)
Steps to Quit Smoking Smoking tobacco can be harmful to your health and can affect almost every organ in your body. Smoking puts you, and those around you, at risk for developing many serious chronic diseases. Quitting smoking is difficult, but it is one of the best things that you can do for your health. It is never too late to quit. What are the benefits of quitting smoking? When you quit smoking, you lower your risk of developing serious diseases and conditions, such as:  Lung cancer or lung disease, such as COPD.  Heart disease.  Stroke.  Heart attack.  Infertility.  Osteoporosis and bone fractures.  Additionally, symptoms such as coughing, wheezing, and shortness of breath may get better when you quit. You may also find that you get sick less often because your body is stronger at fighting off colds and infections. If you are pregnant, quitting smoking can help to reduce your chances of having a baby of low birth weight. How do I get ready to quit? When you decide to quit smoking, create a plan to make sure that you are successful. Before you quit:  Pick a date to quit. Set a date within the next two weeks to give you time to prepare.  Write down the reasons why you are quitting. Keep this list in places where you will see it often, such as on your bathroom mirror or in your car or wallet.  Identify the people, places, things, and activities that make you want to smoke (triggers) and avoid them. Make sure to take these actions: ? Throw away all cigarettes at home, at work, and in your car. ? Throw away smoking accessories, such as ashtrays and lighters. ? Clean your car and make sure to empty the ashtray. ? Clean your home, including curtains and carpets.  Tell your family, friends, and coworkers that you are quitting. Support from your loved ones can make quitting easier.  Talk with your health care provider about your options for quitting smoking.  Find out what treatment  options are covered by your health insurance.  What strategies can I use to quit smoking? Talk with your healthcare provider about different strategies to quit smoking. Some strategies include:  Quitting smoking altogether instead of gradually lessening how much you smoke over a period of time. Research shows that quitting "cold turkey" is more successful than gradually quitting.  Attending in-person counseling to help you build problem-solving skills. You are more likely to have success in quitting if you attend several counseling sessions. Even short sessions of 10 minutes can be effective.  Finding resources and support systems that can help you to quit smoking and remain smoke-free after you quit. These resources are most helpful when you use them often. They can include: ? Online chats with a counselor. ? Telephone quitlines. ? Printed self-help materials. ? Support groups or group counseling. ? Text messaging programs. ? Mobile phone applications.  Taking medicines to help you quit smoking. (If you are pregnant or breastfeeding, talk with your health care provider first.) Some medicines contain nicotine and some do not. Both types of medicines help with cravings, but the medicines that include nicotine help to relieve withdrawal symptoms. Your health care provider may recommend: ? Nicotine patches, gum, or lozenges. ? Nicotine inhalers or sprays. ? Non-nicotine medicine that is taken by mouth.  Talk with your health care provider about combining strategies, such as taking medicines while you are also receiving in-person counseling. Using these two strategies together   makes you more likely to succeed in quitting than if you used either strategy on its own. If you are pregnant or breastfeeding, talk with your health care provider about finding counseling or other support strategies to quit smoking. Do not take medicine to help you quit smoking unless told to do so by your health care  provider. What things can I do to make it easier to quit? Quitting smoking might feel overwhelming at first, but there is a lot that you can do to make it easier. Take these important actions:  Reach out to your family and friends and ask that they support and encourage you during this time. Call telephone quitlines, reach out to support groups, or work with a counselor for support.  Ask people who smoke to avoid smoking around you.  Avoid places that trigger you to smoke, such as bars, parties, or smoke-break areas at work.  Spend time around people who do not smoke.  Lessen stress in your life, because stress can be a smoking trigger for some people. To lessen stress, try: ? Exercising regularly. ? Deep-breathing exercises. ? Yoga. ? Meditating. ? Performing a body scan. This involves closing your eyes, scanning your body from head to toe, and noticing which parts of your body are particularly tense. Purposefully relax the muscles in those areas.  Download or purchase mobile phone or tablet apps (applications) that can help you stick to your quit plan by providing reminders, tips, and encouragement. There are many free apps, such as QuitGuide from the CDC (Centers for Disease Control and Prevention). You can find other support for quitting smoking (smoking cessation) through smokefree.gov and other websites.  How will I feel when I quit smoking? Within the first 24 hours of quitting smoking, you may start to feel some withdrawal symptoms. These symptoms are usually most noticeable 2-3 days after quitting, but they usually do not last beyond 2-3 weeks. Changes or symptoms that you might experience include:  Mood swings.  Restlessness, anxiety, or irritation.  Difficulty concentrating.  Dizziness.  Strong cravings for sugary foods in addition to nicotine.  Mild weight gain.  Constipation.  Nausea.  Coughing or a sore throat.  Changes in how your medicines work in your  body.  A depressed mood.  Difficulty sleeping (insomnia).  After the first 2-3 weeks of quitting, you may start to notice more positive results, such as:  Improved sense of smell and taste.  Decreased coughing and sore throat.  Slower heart rate.  Lower blood pressure.  Clearer skin.  The ability to breathe more easily.  Fewer sick days.  Quitting smoking is very challenging for most people. Do not get discouraged if you are not successful the first time. Some people need to make many attempts to quit before they achieve long-term success. Do your best to stick to your quit plan, and talk with your health care provider if you have any questions or concerns. This information is not intended to replace advice given to you by your health care provider. Make sure you discuss any questions you have with your health care provider. Document Released: 01/16/2001 Document Revised: 09/20/2015 Document Reviewed: 06/08/2014 Elsevier Interactive Patient Education  2017 [employer]  

## 2016-10-17 ENCOUNTER — Ambulatory Visit: Payer: Self-pay | Attending: Family Medicine

## 2016-10-17 DIAGNOSIS — I10 Essential (primary) hypertension: Secondary | ICD-10-CM | POA: Insufficient documentation

## 2016-10-17 NOTE — Progress Notes (Signed)
Patient here for lab visit only 

## 2016-10-18 LAB — CMP14+EGFR
ALBUMIN: 4.9 g/dL (ref 3.5–5.5)
ALT: 23 IU/L (ref 0–32)
AST: 24 IU/L (ref 0–40)
Albumin/Globulin Ratio: 2.3 — ABNORMAL HIGH (ref 1.2–2.2)
Alkaline Phosphatase: 110 IU/L (ref 39–117)
BUN/Creatinine Ratio: 18 (ref 9–23)
BUN: 10 mg/dL (ref 6–24)
Bilirubin Total: 0.4 mg/dL (ref 0.0–1.2)
CO2: 21 mmol/L (ref 20–29)
CREATININE: 0.56 mg/dL — AB (ref 0.57–1.00)
Calcium: 9.6 mg/dL (ref 8.7–10.2)
Chloride: 101 mmol/L (ref 96–106)
GFR calc Af Amer: 121 mL/min/{1.73_m2} (ref >59)
GFR calc non Af Amer: 105 mL/min/{1.73_m2} (ref >59)
GLOBULIN, TOTAL: 2.1 g/dL (ref 1.5–4.5)
Glucose: 86 mg/dL (ref 65–99)
Potassium: 3.9 mmol/L (ref 3.5–5.2)
SODIUM: 141 mmol/L (ref 134–144)
Total Protein: 7 g/dL (ref 6.0–8.5)

## 2016-10-18 LAB — LIPID PANEL
CHOLESTEROL TOTAL: 168 mg/dL (ref 100–199)
Chol/HDL Ratio: 1.8 ratio (ref 0.0–4.4)
HDL: 93 mg/dL (ref >39)
LDL CALC: 52 mg/dL (ref 0–99)
Triglycerides: 113 mg/dL (ref 0–149)
VLDL Cholesterol Cal: 23 mg/dL (ref 5–40)

## 2016-10-23 MED FILL — AMLODIPINE BESYLATE 10 MG T: 10 | 30 days supply | Qty: 30 | Fill #5

## 2016-10-24 ENCOUNTER — Other Ambulatory Visit: Payer: Self-pay | Admitting: Family Medicine

## 2016-10-24 DIAGNOSIS — E7800 Pure hypercholesterolemia, unspecified: Secondary | ICD-10-CM

## 2016-10-29 ENCOUNTER — Encounter: Payer: Self-pay | Admitting: Family Medicine

## 2016-10-29 ENCOUNTER — Ambulatory Visit: Payer: Self-pay | Attending: Family Medicine | Admitting: Family Medicine

## 2016-10-29 VITALS — BP 150/78 | HR 106 | Temp 97.7°F | Ht 67.0 in | Wt 102.6 lb

## 2016-10-29 DIAGNOSIS — Z23 Encounter for immunization: Secondary | ICD-10-CM | POA: Insufficient documentation

## 2016-10-29 DIAGNOSIS — I10 Essential (primary) hypertension: Secondary | ICD-10-CM | POA: Insufficient documentation

## 2016-10-29 DIAGNOSIS — R21 Rash and other nonspecific skin eruption: Secondary | ICD-10-CM | POA: Insufficient documentation

## 2016-10-29 DIAGNOSIS — Z888 Allergy status to other drugs, medicaments and biological substances status: Secondary | ICD-10-CM | POA: Insufficient documentation

## 2016-10-29 DIAGNOSIS — Z7982 Long term (current) use of aspirin: Secondary | ICD-10-CM | POA: Insufficient documentation

## 2016-10-29 DIAGNOSIS — Z79899 Other long term (current) drug therapy: Secondary | ICD-10-CM | POA: Insufficient documentation

## 2016-10-29 MED ORDER — TRIAMCINOLONE ACETONIDE 0.1 % EX CREA: 1.0000 "application " | TOPICAL_CREAM | Freq: Two times a day (BID) | CUTANEOUS | 0 refills | Status: DC

## 2016-10-29 MED FILL — BUPROPION SR 150 MG TABLET: 150 | 30 days supply | Qty: 60 | Fill #0

## 2016-10-29 MED FILL — ?TRIAMCINOLONE 0.1% CRM: 0.1 | 15 days supply | Qty: 30 | Fill #0

## 2016-10-29 MED FILL — ?ATORVASTATIN 20 MG TABLET: 20 | 30 days supply | Qty: 30 | Fill #0

## 2016-10-29 NOTE — Progress Notes (Signed)
Subjective:  Patient ID: Melanie Harris, female    DOB: 24-Sep-1960  Age: 56 y.o. MRN: [id number]  CC: Hand Pain (rash)   HPI MARYLIN LATHON presents with a 2 week history of an erythematous rash on her hands which started out as a scaly pruritic erythematous rash to which she applied Eucerin cream initially and subsequently OTC Hydrocortisone but then noticed edema of her hands today. She has also noticed some erythematous patches begin to appear on the medial aspect of her left elbow. She denies a fever.  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  Past Medical History:  Diagnosis Date  . Abscess of thigh    right  . Hypertension     Past Surgical History:  Procedure Laterality Date  . ABDOMINAL HYSTERECTOMY  2004  . TONSILLECTOMY  10-12 yrs old    Allergies  Allergen Reactions  . Doxycycline Rash     Outpatient Medications Prior to Visit  Medication Sig Dispense Refill  . amLODipine (NORVASC) 10 MG tablet Take 1 tablet (10 mg total) by mouth daily. 30 tablet 6  . aspirin EC 81 MG tablet Take 81 mg by mouth daily.    Marland Kitchen atorvastatin (LIPITOR) 20 MG tablet Take 1 tablet (20 mg total) by mouth daily. 30 tablet 6  . buPROPion (ZYBAN) 150 MG 12 hr tablet Take 1 tablet (150 mg total) by mouth 2 (two) times daily. For smoking cessation (Patient not taking: Reported on 10/29/2016) 60 tablet 3  . ibuprofen (ADVIL,MOTRIN) 600 MG tablet Take 1 tablet (600 mg total) by mouth every 12 (twelve) hours as needed for headache. (Patient not taking: Reported on 10/29/2016) 60 tablet 3  . Multiple Vitamin (MULTIVITAMIN) capsule Take 1 capsule by mouth daily.     No facility-administered medications prior to visit.     ROS Review of Systems  Constitutional: Negative for activity change, appetite change and fatigue.  HENT: Negative for congestion, sinus pressure and sore throat.   Eyes: Negative for visual disturbance.  Respiratory: Negative for cough, chest  tightness, shortness of breath and wheezing.   Cardiovascular: Negative for chest pain and palpitations.  Gastrointestinal: Negative for abdominal distention, abdominal pain and constipation.  Endocrine: Negative for polydipsia.  Genitourinary: Negative for dysuria and frequency.  Musculoskeletal: Negative for arthralgias and back pain.  Skin: Positive for rash.  Neurological: Negative for tremors, light-headedness and numbness.  Hematological: Does not bruise/bleed easily.  Psychiatric/Behavioral: Negative for agitation and behavioral problems.    Objective:  BP (!) 150/78   Pulse (!) 106   Temp 97.7 F (36.5 C) (Oral)   Ht 5\' 7"  (1.702 m)   Wt 102 lb 9.6 oz (46.5 kg)   SpO2 98%   BMI 16.07 kg/m   BP/Weight 10/29/2016 10/10/2016 4/40/1027  Systolic BP 253 664 403  Diastolic BP 78 68 77  Wt. (Lbs) 102.6 102 103.13  BMI 16.07 15.98 16.15      Physical Exam  Constitutional: She is oriented to person, place, and time. She appears well-developed and well-nourished.  Cardiovascular: Normal rate, normal heart sounds and intact distal pulses.   No murmur heard. Pulmonary/Chest: Effort normal and breath sounds normal. She has no wheezes. She has no rales. She exhibits no tenderness.  Abdominal: Soft. Bowel sounds are normal. She exhibits no distension and no mass. There is no tenderness.  Musculoskeletal: Normal range of motion.  Neurological: She is alert and oriented to person, place, and time.  Skin:  Course erythematous rash with associated excoriation of skin on dorsum of both hands Fine rash on palmar aspect of both hands Pitting of nail bail with some edema in cuticles     Assessment & Plan:   1. Rash Contact irritant vs atopic dermatitis Placed on Triamcinolone cream Advised to return sooner if worsening  2. Need for Flu shot. Flu shot administered.  Meds ordered this encounter  Medications  . triamcinolone cream (KENALOG) 0.1 %    Sig: Apply 1 application  topically 2 (two) times daily.    Dispense:  30 g    Refill:  0    Follow-up: Return in about 8 days (around 11/06/2016) for Follow up of rash.   Arnoldo Morale MD

## 2016-10-30 ENCOUNTER — Encounter (HOSPITAL_COMMUNITY): Payer: Self-pay | Admitting: *Deleted

## 2016-10-30 ENCOUNTER — Emergency Department (HOSPITAL_COMMUNITY)
Admission: EM | Admit: 2016-10-30 | Discharge: 2016-10-30 | Disposition: A | Discharge status: Home / Self Care | Payer: Self-pay | Attending: Emergency Medicine | Admitting: Emergency Medicine

## 2016-10-30 DIAGNOSIS — I10 Essential (primary) hypertension: Secondary | ICD-10-CM | POA: Insufficient documentation

## 2016-10-30 DIAGNOSIS — R21 Rash and other nonspecific skin eruption: Secondary | ICD-10-CM | POA: Insufficient documentation

## 2016-10-30 DIAGNOSIS — E7800 Pure hypercholesterolemia, unspecified: Secondary | ICD-10-CM | POA: Insufficient documentation

## 2016-10-30 DIAGNOSIS — Z7982 Long term (current) use of aspirin: Secondary | ICD-10-CM | POA: Insufficient documentation

## 2016-10-30 DIAGNOSIS — Z79899 Other long term (current) drug therapy: Secondary | ICD-10-CM | POA: Insufficient documentation

## 2016-10-30 DIAGNOSIS — F17210 Nicotine dependence, cigarettes, uncomplicated: Secondary | ICD-10-CM | POA: Insufficient documentation

## 2016-10-30 DIAGNOSIS — E876 Hypokalemia: Secondary | ICD-10-CM | POA: Insufficient documentation

## 2016-10-30 DIAGNOSIS — R040 Epistaxis: Secondary | ICD-10-CM | POA: Insufficient documentation

## 2016-10-30 LAB — CBC WITH DIFFERENTIAL/PLATELET
BASOS ABS: 0 10*3/uL (ref 0.0–0.1)
BASOS PCT: 0 %
Eosinophils Absolute: 0.2 10*3/uL (ref 0.0–0.7)
Eosinophils Relative: 2 %
HEMATOCRIT: 39.4 % (ref 36.0–46.0)
HEMOGLOBIN: 13.5 g/dL (ref 12.0–15.0)
Lymphocytes Relative: 18 %
Lymphs Abs: 1.5 10*3/uL (ref 0.7–4.0)
MCH: 31.7 pg (ref 26.0–34.0)
MCHC: 34.3 g/dL (ref 30.0–36.0)
MCV: 92.5 fL (ref 78.0–100.0)
MONO ABS: 0.7 10*3/uL (ref 0.1–1.0)
Monocytes Relative: 9 %
NEUTROS ABS: 5.7 10*3/uL (ref 1.7–7.7)
NEUTROS PCT: 71 %
Platelets: 260 10*3/uL (ref 150–400)
RBC: 4.26 MIL/uL (ref 3.87–5.11)
RDW: 13.4 % (ref 11.5–15.5)
WBC: 8.1 10*3/uL (ref 4.0–10.5)

## 2016-10-30 LAB — BASIC METABOLIC PANEL
ANION GAP: 8 (ref 5–15)
BUN: 6 mg/dL (ref 6–20)
CALCIUM: 9.4 mg/dL (ref 8.9–10.3)
CO2: 24 mmol/L (ref 22–32)
Chloride: 105 mmol/L (ref 101–111)
Creatinine, Ser: 0.59 mg/dL (ref 0.44–1.00)
GFR calc non Af Amer: >60 mL/min (ref >60)
Glucose, Bld: 103 mg/dL — ABNORMAL HIGH (ref 65–99)
POTASSIUM: 2.9 mmol/L — AB (ref 3.5–5.1)
Sodium: 137 mmol/L (ref 135–145)

## 2016-10-30 LAB — PROTIME-INR
INR: 0.92
Prothrombin Time: 12.2 seconds (ref 11.4–15.2)

## 2016-10-30 MED ORDER — OXYMETAZOLINE HCL 0.05 % NA SOLN
2.0000 | Freq: Once | NASAL | Status: AC
  Administered 2016-10-30: 2 via NASAL
  Filled 2016-10-30: qty 15

## 2016-10-30 MED ORDER — PREDNISONE 20 MG PO TABS
60.0000 mg | ORAL_TABLET | Freq: Once | ORAL | Status: AC
  Administered 2016-10-30: 60 mg via ORAL
  Filled 2016-10-30: qty 3

## 2016-10-30 MED ORDER — POTASSIUM CHLORIDE CRYS ER 20 MEQ PO TBCR
40.0000 meq | EXTENDED_RELEASE_TABLET | Freq: Once | ORAL | Status: AC
  Administered 2016-10-30: 40 meq via ORAL
  Filled 2016-10-30: qty 2

## 2016-10-30 MED ORDER — PREDNISONE 20 MG PO TABS: 40.0000 mg | ORAL_TABLET | Freq: Every day | ORAL | 0 refills | Status: AC

## 2016-10-30 MED FILL — ?PREDNISONE 20MG TABLET: 20 | 5 days supply | Qty: 10 | Fill #0

## 2016-10-30 NOTE — ED Notes (Signed)
Pt bleeding from right nare controlled at this time. Will continue to monitor after afrin admin

## 2016-10-30 NOTE — ED Triage Notes (Signed)
Pt started having dry skin and thought eczema and yesterday woke up with red swollen hands and MD gave her cream, now the hands are cracked and weeping.  Pt then started having a nose bleed

## 2016-10-30 NOTE — Discharge Instructions (Signed)
Your exam and laboratory workup was reassuring today. You are not anemic. You had low potassium which we gave gave you oral supplementation. As oral steroids helped with your worsening rash in the past, we are giving you another prescription of them. Please take them for the next 5 days. Please follow up with your primary care physician in the next several days. Please maintain good hydration and take her blood pressure medications as high blood pressure and cause recurrent nosebleeds. We did not see an anterior nosebleed but it responded well to Afrin. If your symptoms change or worsen, please return to the nearest emergency department.

## 2016-10-30 NOTE — ED Notes (Signed)
Pt holding pressure to bilateral nares x10 min. Minimal bleeding at this time. Pt continues to hold pressure. Dr. Sherry Ruffing at bedside

## 2016-10-30 NOTE — ED Provider Notes (Signed)
[facility] DEPT Provider Note   CSN: [id number] Arrival date & time: 10/30/16  1123     History   Chief Complaint Chief Complaint  Patient presents with  . skin infection    HPI Melanie Harris is a 56 y.o. female.  The history is provided by the patient and a relative.  Epistaxis   This is a new problem. The current episode started more than 2 days ago. The problem occurs daily. The problem has not changed since onset.The bleeding has been from the right nare. She has tried nothing for the symptoms. The treatment provided no relief. Her past medical history does not include bleeding disorder, colds, sinus problems, allergies, nose-picking or frequent nosebleeds.    Past Medical History:  Diagnosis Date  . Abscess of thigh    right  . Hypertension     Patient Active Problem List   Diagnosis Date Noted  . Low grade squamous intraepithelial lesion (LGSIL) on cervical Pap smear 07/03/2016  . Pure hypercholesterolemia 12/22/2015  . Anxiety state 12/21/2015  . Other emphysema ([place]) 08/12/2013  . Cough 08/12/2013  . Smoking 08/12/2013  . Essential hypertension 08/12/2013  . Rash 08/21/2011  . Abscess of thigh   . Abscess of right thigh 07/25/2011    Past Surgical History:  Procedure Laterality Date  . ABDOMINAL HYSTERECTOMY  2004  . TONSILLECTOMY  [age] yrs old    OB History    Gravida Para Term Preterm AB Living   1 1       1    SAB TAB Ectopic Multiple Live Births           1       Home Medications    Prior to Admission medications   Medication Sig Start Date End Date Taking? Authorizing Provider  amLODipine (NORVASC) 10 MG tablet Take 1 tablet (10 mg total) by mouth daily. 10/10/16   Arnoldo Morale, MD  aspirin EC 81 MG tablet Take 81 mg by mouth daily.    [provider]  atorvastatin (LIPITOR) 20 MG tablet Take 1 tablet (20 mg total) by mouth daily. 10/10/16   Arnoldo Morale, MD  buPROPion (ZYBAN) 150 MG 12 hr tablet Take 1 tablet (150 mg total)  by mouth 2 (two) times daily. For smoking cessation Patient not taking: Reported on 10/29/2016 10/10/16   Arnoldo Morale, MD  ibuprofen (ADVIL,MOTRIN) 600 MG tablet Take 1 tablet (600 mg total) by mouth every 12 (twelve) hours as needed for headache. Patient not taking: Reported on 10/29/2016 05/28/16   Arnoldo Morale, MD  Multiple Vitamin (MULTIVITAMIN) capsule Take 1 capsule by mouth daily.    [provider]  triamcinolone cream (KENALOG) 0.1 % Apply 1 application topically 2 (two) times daily. 10/29/16   Arnoldo Morale, MD    Family History Family History  Problem Relation Age of Onset  . Heart failure Mother   . Lung cancer Father     Social History Social History  Substance Use Topics  . Smoking status: Current Every Day Smoker    Packs/day: 0.25    Years: 30.00    Types: Cigarettes  . Smokeless tobacco: Never Used     Comment: 5-6 cigs daily  . Alcohol use 0.6 oz/week    1 Glasses of wine per week     Comment: occassional     Allergies   Doxycycline   Review of Systems Review of Systems  Constitutional: Negative for chills, diaphoresis, fatigue and fever.  HENT: Positive for nosebleeds. Negative  for congestion.   Respiratory: Negative for cough, chest tightness, shortness of breath, wheezing and stridor.   Gastrointestinal: Negative for abdominal pain, diarrhea, nausea and vomiting.  Genitourinary: Negative for dysuria, flank pain and frequency.  Musculoskeletal: Negative for back pain, neck pain and neck stiffness.  Skin: Positive for rash. Negative for wound.  Neurological: Negative for dizziness, seizures, weakness, light-headedness, numbness and headaches.  Psychiatric/Behavioral: Negative for agitation.  All other systems reviewed and are negative.    Physical Exam Updated Vital Signs BP (!) 145/90 (BP Location: Left Arm)   Pulse (!) 115   Temp 98.1 F (36.7 C) (Oral)   Resp 17   Ht 5\' 7"  (1.702 m)   Wt 45.5 kg (100 lb 4.8 oz)   SpO2 98%   BMI  15.71 kg/m   Physical Exam  Constitutional: She is oriented to person, place, and time. She appears well-developed and well-nourished. No distress.  HENT:  Head: Normocephalic and atraumatic.  Right Ear: External ear normal.  Left Ear: External ear normal.  Nose: Epistaxis is observed.    Mouth/Throat: Oropharynx is clear and moist. No oropharyngeal exudate.  Eyes: Pupils are equal, round, and reactive to light. Conjunctivae and EOM are normal.  Neck: Normal range of motion. Neck supple.  Cardiovascular: Normal rate and intact distal pulses.   No murmur heard. Pulmonary/Chest: Effort normal and breath sounds normal. No stridor. No respiratory distress. She has no wheezes. She has no rales. She exhibits no tenderness.  Abdominal: She exhibits no distension. There is no tenderness. There is no rebound.  Musculoskeletal: She exhibits tenderness (hands). She exhibits no edema.       Right hand: She exhibits tenderness. She exhibits normal capillary refill, no deformity, no laceration and no swelling. Normal sensation noted. Normal strength noted.       Left hand: She exhibits tenderness. She exhibits normal range of motion, no bony tenderness and no laceration. Normal sensation noted. Normal strength noted.  Rash on dorsum of both hands. Painful. Tenderness. Normal pulses and sensaton. No evidence of infection.   Neurological: She is alert and oriented to person, place, and time. She has normal reflexes. No sensory deficit. She exhibits normal muscle tone. Coordination normal.  Skin: Skin is warm. Capillary refill takes less than 2 seconds. No rash noted. She is not diaphoretic. No erythema.  Psychiatric: She has a normal mood and affect.  Nursing note and vitals reviewed.    ED Treatments / Results  Labs (all labs ordered are listed, but only abnormal results are displayed) Labs Reviewed  BASIC METABOLIC PANEL - Abnormal; Notable for the following:       Result Value   Potassium 2.9  (*)    Glucose, Bld 103 (*)    All other components within normal limits  CBC WITH DIFFERENTIAL/PLATELET  PROTIME-INR    EKG  EKG Interpretation None       Radiology No results found.  Procedures Procedures (including critical care time)  Medications Ordered in ED Medications  oxymetazoline (AFRIN) 0.05 % nasal spray 2 spray (2 sprays Right Nare Given 10/30/16 1234)  predniSONE (DELTASONE) tablet 60 mg (60 mg Oral Given 10/30/16 1501)  potassium chloride SA (K-DUR,KLOR-CON) CR tablet 40 mEq (40 mEq Oral Given 10/30/16 1501)     Initial Impression / Assessment and Plan / ED Course  I have reviewed the triage vital signs and the nursing notes.  Pertinent labs & imaging results that were available during my care of the patient were reviewed  by me and considered in my medical decision making (see chart for details).     Melanie Harris is a 56 y.o. female past medical history significant for hypertension, emphysema, and eczema/psoriasis who presents with worsening rash and epistaxis. Patient says that for the last several weeks she has had worsening and dryness. She says over the last few days it has looked red and rash has worsened. She also reports having some rash on her elbows similar to her asthma before. She says that over the last 3 days she has had several episodes of epistaxis that stopped on its own. Patient was told to come to the ED for further evaluation.  Patient denies fevers, chills, chest shortness breath, nausea, vomiting, constipation, diarrhea, dysuria. Patient denies recent injuries. She says the last time she had similar rash, steroids made it significantly improved. She reports using tramadol and cream yesterday that did not help the symptoms. Patient is also concerned about her epistaxis it is from the right ear.  On exam, the patient has dried blood in the right ear. Patient had some bleeding in the posterior oropharynx that was minimal. Lungs were clear  chest is nontender. Cracking rash seen on both dorsum of hands. Normal grip strength and normal pulses. No evidence of infection. Mild erythema and tenderness but did appear to be more of an inflammatory reaction than severe infection.   The patient's care was cleaned out with Q-tips and there is no anterior bleed seen. Patient was given aspirin with nasal pressure being held. The patient had resolution of epistaxis with no further bleeding.  Patient's labs were checked to make sure she had normal platelets and no other evidence of bleeding. Patient had no anemia and her platelets are normal. No evidence of INR abnormality. Patient was seen to have hypokalemia and she was given oral potassium.  Patient was given prescription for steroids given her previous improvement with that. Patient will follow up with her PCP for further management. Patient advised on epistaxis management.  Patient had no depressions or concerns and was discharged in good condition with improving symptoms of  Her presenting epistaxis and with the plan for treatment of her rash.    Final Clinical Impressions(s) / ED Diagnoses   Final diagnoses:  Rash of hands  Hypokalemia  Right-sided epistaxis    New Prescriptions New Prescriptions   No medications on file    Clinical Impression: 1. Rash of hands   2. Hypokalemia   3. Right-sided epistaxis     Disposition: Discharge  Condition: Good  I have discussed the results, Dx and Tx plan with the pt(& family if present). He/she/they expressed understanding and agree(s) with the plan. Discharge instructions discussed at great length. Strict return precautions discussed and pt &/or family have verbalized understanding of the instructions. No further questions at time of discharge.    Discharge Medication List as of 10/30/2016  2:21 PM    START taking these medications   Details  predniSONE (DELTASONE) 20 MG tablet Take 2 tablets (40 mg total) by mouth daily.,  Starting Tue 10/30/2016, Until Sun 11/04/2016, Print        Follow Up: Arnoldo Morale, MD [place] [address] [phone]  Schedule an appointment as soon as possible for a visit    [place] [place] [address] [id number] [place] [phone]  If symptoms worsen     Tegeler, Gwenyth Allegra, MD 10/31/16 2149

## 2016-11-06 ENCOUNTER — Encounter: Payer: Self-pay | Admitting: Family Medicine

## 2016-11-06 ENCOUNTER — Ambulatory Visit: Payer: Self-pay | Attending: Family Medicine | Admitting: Family Medicine

## 2016-11-06 VITALS — BP 146/71 | HR 97 | Temp 98.2°F | Ht 67.0 in | Wt 103.8 lb

## 2016-11-06 DIAGNOSIS — E785 Hyperlipidemia, unspecified: Secondary | ICD-10-CM | POA: Insufficient documentation

## 2016-11-06 DIAGNOSIS — L409 Psoriasis, unspecified: Secondary | ICD-10-CM

## 2016-11-06 DIAGNOSIS — Z7982 Long term (current) use of aspirin: Secondary | ICD-10-CM | POA: Insufficient documentation

## 2016-11-06 DIAGNOSIS — Z79899 Other long term (current) drug therapy: Secondary | ICD-10-CM | POA: Insufficient documentation

## 2016-11-06 DIAGNOSIS — I10 Essential (primary) hypertension: Secondary | ICD-10-CM | POA: Insufficient documentation

## 2016-11-06 DIAGNOSIS — Z881 Allergy status to other antibiotic agents status: Secondary | ICD-10-CM | POA: Insufficient documentation

## 2016-11-06 MED ORDER — TRIAMCINOLONE ACETONIDE 0.1 % EX CREA: 1.0000 "application " | TOPICAL_CREAM | Freq: Two times a day (BID) | CUTANEOUS | 1 refills | Status: DC

## 2016-11-06 MED FILL — ?TRIAMCINOLONE 0.1% CRM: 0.1 | 15 days supply | Qty: 75 | Fill #0

## 2016-11-06 NOTE — Patient Instructions (Signed)
Psoriasis Psoriasis is a long-term (chronic) condition of skin inflammation. It occurs because your immune system causes skin cells to form too quickly. As a result, too many skin cells grow and create raised, red patches (plaques) that look silvery on your skin. Plaques may appear anywhere on your body. They can be any size or shape. Psoriasis can come and go. The condition varies from mild to very severe. It cannot be passed from one person to another (not contagious). What are the causes? The cause of psoriasis is not known, but certain factors can make the condition worse. These include:  Damage or trauma to the skin, such as cuts, scrapes, sunburn, and dryness.  Lack of sunlight.  Certain medicines.  Alcohol.  Tobacco use.  Stress.  Infections caused by bacteria or viruses.  What increases the risk? This condition is more likely to develop in:  People with a family history of psoriasis.  People who are Caucasian.  People who are between the ages of 15-30 and 50-60 years old.  What are the signs or symptoms? There are five different types of psoriasis. You can have more than one type of psoriasis during your life. Types are:  Plaque.  Guttate.  Inverse.  Pustular.  Erythrodermic.  Each type of psoriasis has different symptoms.  Plaque psoriasis symptoms include red, raised plaques with a silvery white coating (scale). These plaques may be itchy. Your nails may be pitted and crumbly or fall off.  Guttate psoriasis symptoms include small red spots that often show up on your trunk, arms, and legs. These spots may develop after you have been sick, especially with strep throat.  Inverse psoriasis symptoms include plaques in your underarm area, under your breasts, or on your genitals, groin, or buttocks.  Pustular psoriasis symptoms include pus-filled bumps that are painful, red, and swollen on the palms of your hands or the soles of your feet. You also may feel  exhausted, feverish, weak, or have no appetite.  Erythrodermic psoriasis symptoms include bright red skin that may look burned. You may have a fast heartbeat and a body temperature that is too high or too low. You may be itchy or in pain.  How is this diagnosed? Your health care provider may suspect psoriasis based on your symptoms and family history. Your health care provider will also do a physical exam. This may include a procedure to remove a tissue sample (biopsy) for testing. You may also be referred to a health care provider who specializes in skin diseases (dermatologist). How is this treated? There is no cure for this condition, but treatment can help manage it. Goals of treatment include:  Helping your skin heal.  Reducing itching and inflammation.  Slowing the growth of new skin cells.  Helping your immune system respond better to your skin.  Treatment varies, depending on the severity of your condition. Treatment may include:  Creams or ointments.  Ultraviolet ray exposure (light therapy). This may include natural sunlight or light therapy in a medical office.  Medicines (systemic therapy). These medicines can help your body better manage skin cell turnover and inflammation. They may be used along with light therapy or ointments. You may also get antibiotic medicines if you have an infection.  Follow these instructions at home: Skin Care  Moisturize your skin as needed. Only use moisturizers that have been approved by your health care provider.  Apply cool compresses to the affected areas.  Do not scratch your skin. Lifestyle   Do not   use tobacco products. This includes cigarettes, chewing tobacco, and e-cigarettes. If you need help quitting, ask your health care provider.  Drink little or no alcohol.  Try techniques for stress reduction, such as meditation or yoga.  Get exposure to the sun as told by your health care provider. Do not get sunburned.  Consider  joining a psoriasis support group. Medicines  Take or use over-the-counter and prescription medicines only as told by your health care provider.  If you were prescribed an antibiotic, take or use it as told by your health care provider. Do not stop taking the antibiotic even if your condition starts to improve. General instructions  Keep a journal to help track what triggers an outbreak. Try to avoid any triggers.  See a counselor or social worker if feelings of sadness, frustration, and hopelessness about your condition are interfering with your work and relationships.  Keep all follow-up visits as told by your health care provider. This is important. Contact a health care provider if:  Your pain gets worse.  You have increasing redness or warmth in the affected areas.  You have new or worsening pain or stiffness in your joints.  Your nails start to break easily or pull away from the nail bed.  You have a fever.  You feel depressed. This information is not intended to replace advice given to you by your health care provider. Make sure you discuss any questions you have with your health care provider. Document Released: 01/20/2000 Document Revised: 06/30/2015 Document Reviewed: 06/09/2014 Elsevier Interactive Patient Education  2018 [employer] Inc[employer]  

## 2016-11-06 NOTE — Progress Notes (Signed)
Subjective:  Patient ID: Melanie Harris, female    DOB: 1960-04-19  Age: 56 y.o. MRN: [id number]  CC: Rash   HPI Melanie Harris is a 56 year old female with hypertension, hyperlipidemia who presents today for follow-up of rash for which she was seen at the last visit 8 days ago and treated with triamcinolone. Rash worsened with increased scaling, edema of her hands which led to an ED presentation one day later where she received prednisone which she has completed. Today she presents with a rash which has extended to her upper extremities and lower extremities, is erythematous and associated scaling on her forearms. Rash is absent on her trunk Total duration of rash is 3-4 weeks. She denies fevers. She informs me she has had a similar rash in the past which was not as extensive. She denies allergies.   Past Medical History:  Diagnosis Date  . Abscess of thigh    right  . Hypertension     Past Surgical History:  Procedure Laterality Date  . ABDOMINAL HYSTERECTOMY  2004  . TONSILLECTOMY  [age] yrs old    Allergies  Allergen Reactions  . Doxycycline Rash    Outpatient Medications Prior to Visit  Medication Sig Dispense Refill  . amLODipine (NORVASC) 10 MG tablet Take 1 tablet (10 mg total) by mouth daily. 30 tablet 6  . aspirin EC 81 MG tablet Take 81 mg by mouth daily.    Marland Kitchen atorvastatin (LIPITOR) 20 MG tablet Take 1 tablet (20 mg total) by mouth daily. 30 tablet 6  . buPROPion (ZYBAN) 150 MG 12 hr tablet Take 1 tablet (150 mg total) by mouth 2 (two) times daily. For smoking cessation 60 tablet 3  . ibuprofen (ADVIL,MOTRIN) 600 MG tablet Take 1 tablet (600 mg total) by mouth every 12 (twelve) hours as needed for headache. 60 tablet 3  . Multiple Vitamin (MULTIVITAMIN) capsule Take 1 capsule by mouth daily.    Marland Kitchen triamcinolone cream (KENALOG) 0.1 % Apply 1 application topically 2 (two) times daily. 30 g 0   No facility-administered medications prior to visit.      ROS Review of Systems  Constitutional: Negative for activity change, appetite change and fatigue.  HENT: Negative for congestion, sinus pressure and sore throat.   Eyes: Negative for visual disturbance.  Respiratory: Negative for cough, chest tightness, shortness of breath and wheezing.   Cardiovascular: Negative for chest pain and palpitations.  Gastrointestinal: Negative for abdominal distention, abdominal pain and constipation.  Endocrine: Negative for polydipsia.  Genitourinary: Negative for dysuria and frequency.  Musculoskeletal: Negative for arthralgias and back pain.  Skin: Positive for rash.  Neurological: Negative for tremors, light-headedness and numbness.  Hematological: Does not bruise/bleed easily.  Psychiatric/Behavioral: Negative for agitation and behavioral problems.    Objective:  BP (!) 146/71   Pulse 97   Temp 98.2 F (36.8 C) (Oral)   Ht 5\' 7"  (1.702 m)   Wt 103 lb 12.8 oz (47.1 kg)   SpO2 97%   BMI 16.26 kg/m   BP/Weight 11/06/2016 10/30/2016 08/17/4578  Systolic BP 998 338 250  Diastolic BP 71 80 78  Wt. (Lbs) 103.8 100.3 102.6  BMI 16.26 15.71 16.07      Physical Exam  Constitutional: She is oriented to person, place, and time. She appears well-developed and well-nourished.  Cardiovascular: Normal rate, normal heart sounds and intact distal pulses.   No murmur heard. Pulmonary/Chest: Effort normal and breath sounds normal. She has no wheezes. She has no rales.  She exhibits no tenderness.  Abdominal: Soft. Bowel sounds are normal. She exhibits no distension and no mass. There is no tenderness.  Musculoskeletal: Normal range of motion.  Neurological: She is alert and oriented to person, place, and time.  Skin:  Reduced scaling on hands compared to previous visit. See attached image         Assessment & Plan:   1. Psoriasis Cbc to evaluate for possible underlying infection Rash probably exacerbated by recent use of oral  steroid Continue triamcinolone - Ambulatory referral to Dermatology   Meds ordered this encounter  Medications  . DISCONTD: triamcinolone cream (KENALOG) 0.1 %    Sig: Apply 1 application topically 2 (two) times daily.    Dispense:  80 g    Refill:  1  . triamcinolone cream (KENALOG) 0.1 %    Sig: Apply 1 application topically 2 (two) times daily.    Dispense:  80 g    Refill:  1    Follow-up: Return in about 3 weeks (around 11/27/2016) for follow up on rash.   Arnoldo Morale MD

## 2016-11-07 LAB — CBC WITH DIFFERENTIAL/PLATELET
BASOS ABS: 0.1 10*3/uL (ref 0.0–0.2)
Basos: 1 %
EOS (ABSOLUTE): 0.2 10*3/uL (ref 0.0–0.4)
Eos: 2 %
Hematocrit: 35.9 % (ref 34.0–46.6)
Hemoglobin: 12.3 g/dL (ref 11.1–15.9)
Immature Grans (Abs): 0.1 10*3/uL (ref 0.0–0.1)
Immature Granulocytes: 1 %
LYMPHS ABS: 3.4 10*3/uL — AB (ref 0.7–3.1)
Lymphs: 35 %
MCH: 31.2 pg (ref 26.6–33.0)
MCHC: 34.3 g/dL (ref 31.5–35.7)
MCV: 91 fL (ref 79–97)
MONOS ABS: 1.1 10*3/uL — AB (ref 0.1–0.9)
Monocytes: 11 %
NEUTROS PCT: 50 %
Neutrophils Absolute: 5 10*3/uL (ref 1.4–7.0)
Platelets: 336 10*3/uL (ref 150–379)
RBC: 3.94 x10E6/uL (ref 3.77–5.28)
RDW: 13.9 % (ref 12.3–15.4)
WBC: 9.9 10*3/uL (ref 3.4–10.8)

## 2016-11-20 ENCOUNTER — Ambulatory Visit: Payer: Self-pay | Attending: Family Medicine | Admitting: Physician Assistant

## 2016-11-20 ENCOUNTER — Encounter: Payer: Self-pay | Admitting: Physician Assistant

## 2016-11-20 VITALS — BP 145/74 | HR 106 | Temp 98.3°F | Resp 18 | Ht 67.0 in | Wt 101.0 lb

## 2016-11-20 DIAGNOSIS — I10 Essential (primary) hypertension: Secondary | ICD-10-CM | POA: Insufficient documentation

## 2016-11-20 DIAGNOSIS — Z881 Allergy status to other antibiotic agents status: Secondary | ICD-10-CM | POA: Insufficient documentation

## 2016-11-20 DIAGNOSIS — Z79899 Other long term (current) drug therapy: Secondary | ICD-10-CM | POA: Insufficient documentation

## 2016-11-20 DIAGNOSIS — Z7982 Long term (current) use of aspirin: Secondary | ICD-10-CM | POA: Insufficient documentation

## 2016-11-20 DIAGNOSIS — R040 Epistaxis: Secondary | ICD-10-CM | POA: Insufficient documentation

## 2016-11-20 MED FILL — AMLODIPINE BESYLATE 10 MG T: 10 | 30 days supply | Qty: 30 | Fill #0

## 2016-11-20 NOTE — Progress Notes (Signed)
Melanie Harris, is a 56 y.o. female  IRJ:[id number]  TKZ:[id number]  DOB - 1960-06-22  Subjective:  Chief Complaint and HPI: Melanie Harris is a 56 y.o. female here today for about 3 week h/o multiple nose bleeds.  Seen in the ED for this 10/30/2016.  Denies digital trauma.  No recent URI.  No nasal sprays.  Denies seasonal allergies.  No bleeding gums or other bleeding issues.  Nose bleeds have been R nostril only and have lasted from 15 mins to as long as 1.5 hours last night.  She was on her way to the ED last night when the bleeding finally stopped.  No melena/hematochezia.  No vaginal bleeding.    ROS:   Constitutional:  No f/c, No night sweats, No unexplained weight loss. EENT:  No vision changes, No blurry vision, No hearing changes. No other mouth, throat, or ear problems.  Respiratory: No cough, No SOB Cardiac: No CP, no palpitations GI:  No abd pain, No N/V/D. GU: No Urinary s/sx Musculoskeletal: No joint pain Neuro: No headache, no dizziness, no motor weakness.  Skin: No rash Endocrine:  No polydipsia. No polyuria.  Psych: Denies SI/HI  No problems updated.  ALLERGIES: Allergies  Allergen Reactions  . Doxycycline Rash    PAST MEDICAL HISTORY: Past Medical History:  Diagnosis Date  . Abscess of thigh    right  . Hypertension     MEDICATIONS AT HOME: Prior to Admission medications   Medication Sig Start Date End Date Taking? Authorizing Provider  amLODipine (NORVASC) 10 MG tablet Take 1 tablet (10 mg total) by mouth daily. 10/10/16  Yes Arnoldo Morale, MD  aspirin EC 81 MG tablet Take 81 mg by mouth daily.   Yes [provider]  atorvastatin (LIPITOR) 20 MG tablet Take 1 tablet (20 mg total) by mouth daily. 10/10/16  Yes Arnoldo Morale, MD  buPROPion (ZYBAN) 150 MG 12 hr tablet Take 1 tablet (150 mg total) by mouth 2 (two) times daily. For smoking cessation 10/10/16  Yes Arnoldo Morale, MD  ibuprofen (ADVIL,MOTRIN) 600 MG tablet Take 1 tablet (600 mg total) by  mouth every 12 (twelve) hours as needed for headache. 05/28/16  Yes Arnoldo Morale, MD  Multiple Vitamin (MULTIVITAMIN) capsule Take 1 capsule by mouth daily.   Yes [provider]  triamcinolone cream (KENALOG) 0.1 % Apply 1 application topically 2 (two) times daily. 11/06/16  Yes Arnoldo Morale, MD     Objective:  EXAM:   Vitals:   11/20/16 1346  BP: (!) 145/74  Pulse: (!) 106  Resp: 18  Temp: 98.3 F (36.8 C)  TempSrc: Oral  SpO2: 98%  Weight: 101 lb (45.8 kg)  Height: 5\' 7"  (1.702 m)    General appearance : A&OX3. NAD. Non-toxic-appearing HEENT: Atraumatic and Normocephalic.  PERRLA. EOM intact.  TM clear B. Mouth-MMM, post pharynx WNL w/o erythema, No PND.  Nose- R nostril with turbinates swollen and inflame with no obvious excoriation or scabbing.  L nostril always appears inflamed.   Neck: supple, no JVD. No cervical lymphadenopathy. No thyromegaly Chest/Lungs:  Breathing-non-labored, Good air entry bilaterally, breath sounds normal without rales, rhonchi, or wheezing  CVS: S1 S2 regular, no murmurs, gallops, rubs  Extremities: Bilateral Lower Ext shows no edema, both legs are warm to touch with = pulse throughout Neurology:  CN II-XII grossly intact, Non focal.   Psych:  TP linear. J/I WNL. Normal speech. Appropriate eye contact and affect.  Skin:  No Rash  Data Review Lab Results  Component Value Date   HGBA1C 5.3 04/20/2013     Assessment & Plan   1. Epistaxis Not actively bleeding now epistaxis care provided. Cold air humidifier, saline nasal spray, vaseline into nares at night.   - CBC with Differential/Platelet - Protime-INR - Ambulatory referral to ENT     Patient have been counseled extensively about nutrition and exercise  Return if symptoms worsen or fail to improve.  The patient was given clear instructions to go to ER or return to medical center if symptoms don't improve, worsen or new problems develop. The patient verbalized  understanding. The patient was told to call to get lab results if they haven't heard anything in the next week.     Freeman Caldron, PA-C [facility] and [place], [place]   11/20/2016, 1:59 PMPatient ID: Melanie Harris, female   DOB: Apr 12, 1960, 56 y.o.   MRN: [id number]

## 2016-11-20 NOTE — Patient Instructions (Signed)
Nosebleed A nosebleed is when blood comes out of the nose. Nosebleeds are common. They are usually not a sign of a serious medical condition. Follow these instructions at home:  Try controlling your nosebleed by pinching your nostrils gently. Do this for at least 10 minutes.  Avoid blowing or sniffing your nose for a number of hours after having a nosebleed.  Do not put gauze inside of your nose yourself. If your nose was packed by your doctor, try to keep the pack inside of your nose until your doctor removes it. ? If a gauze pack was used and it starts to fall out, gently replace it or cut off the end of it. ? If a balloon catheter was used to pack your nose, do not cut or remove it unless told by your doctor.  Avoid lying down while you are having a nosebleed. Sit up and lean forward.  Use a nasal spray decongestant to help with a nosebleed as told by your doctor.  Do not use petroleum jelly or mineral oil in your nose. These can drip into your lungs.  Keep your house humid by using: ? Less air conditioning. ? A humidifier.  Aspirin and blood thinners make bleeding more likely. If you are prescribed these medicines and you have nosebleeds, ask your doctor if you should stop taking the medicines or adjust the dose. Do not stop medicines unless told by your doctor.  Resume your normal activities as you are able. Avoid straining, lifting, or bending at your waist for several days.  If your nosebleed was caused by dryness in your nose, use over-the-counter saline nasal spray or gel. If you must use a lubricant: ? Choose one that is water-soluble. ? Use it only as needed. ? Do not use it within several hours of lying down.  Keep all follow-up visits as told by your doctor. This is important. Contact a health care provider if:  You have a fever.  You get frequent nosebleeds.  You are getting nosebleeds more often. Get help right away if:  Your nosebleed lasts longer than 20  minutes.  Your nosebleed occurs after an injury to your face, and your nose looks crooked or broken.  You have unusual bleeding from other parts of your body.  You have unusual bruising on other parts of your body.  You feel light-headed or dizzy.  You become sweaty.  You throw up (vomit) blood.  You have a nosebleed after a head injury. This information is not intended to replace advice given to you by your health care provider. Make sure you discuss any questions you have with your health care provider. Document Released: 11/01/2007 Document Revised: 08/26/2015 Document Reviewed: 09/07/2013 Elsevier Interactive Patient Education  2018 [employer]vier [employer].  

## 2016-11-21 LAB — CBC WITH DIFFERENTIAL/PLATELET
BASOS ABS: 0 10*3/uL (ref 0.0–0.2)
Basos: 0 %
EOS (ABSOLUTE): 0.2 10*3/uL (ref 0.0–0.4)
EOS: 2 %
HEMATOCRIT: 36.9 % (ref 34.0–46.6)
HEMOGLOBIN: 12.5 g/dL (ref 11.1–15.9)
Immature Grans (Abs): 0 10*3/uL (ref 0.0–0.1)
Immature Granulocytes: 0 %
LYMPHS ABS: 2.6 10*3/uL (ref 0.7–3.1)
Lymphs: 29 %
MCH: 31.6 pg (ref 26.6–33.0)
MCHC: 33.9 g/dL (ref 31.5–35.7)
MCV: 93 fL (ref 79–97)
MONOCYTES: 8 %
MONOS ABS: 0.7 10*3/uL (ref 0.1–0.9)
NEUTROS ABS: 5.5 10*3/uL (ref 1.4–7.0)
Neutrophils: 61 %
Platelets: 291 10*3/uL (ref 150–379)
RBC: 3.95 x10E6/uL (ref 3.77–5.28)
RDW: 13.4 % (ref 12.3–15.4)
WBC: 9 10*3/uL (ref 3.4–10.8)

## 2016-11-21 LAB — PROTIME-INR
INR: 1 (ref 0.8–1.2)
Prothrombin Time: 10.2 s (ref 9.1–12.0)

## 2016-11-22 ENCOUNTER — Telehealth: Payer: Self-pay | Admitting: *Deleted

## 2016-11-22 NOTE — Telephone Encounter (Signed)
-----   Message from Argentina Donovan, [place] sent at 11/21/2016 10:24 PM EDT ----- Blood count, platelets, and bleeding times are all normal which are reassuring that your nose bleeds aren't coming from a bleeding disorder.  Appointment with ENT is being scheduled.  Use the saline nasal spray, vaseline, and cold air humidifier in the mean time as we discussed.  Thanks, Freeman Caldron, PA-C

## 2016-11-22 NOTE — Telephone Encounter (Signed)
Patient verified DOB Patient is aware of CBC being normal and showing no indication of a bleeding disorder to support the persistent nose bleeds. Patient advised to continue with ENT referral and to use nasal spray, Vaseline, and cold mist humidifier until appointment. Patient reports no nose bleeds since appointment. No further questions.

## 2016-11-27 ENCOUNTER — Ambulatory Visit: Payer: Self-pay | Admitting: Family Medicine

## 2016-12-07 MED FILL — ATORVASTATIN 20 MG TABLET: 20 | 30 days supply | Qty: 30 | Fill #1

## 2016-12-21 MED FILL — AMLODIPINE BESYLATE 10 MG T: 10 | 30 days supply | Qty: 30 | Fill #1

## 2017-01-17 ENCOUNTER — Ambulatory Visit: Payer: Self-pay

## 2017-01-22 MED FILL — AMLODIPINE BESYLATE 10 MG T: 10 | 30 days supply | Qty: 30 | Fill #2

## 2017-01-22 MED FILL — ?ATORVASTATIN 20 MG TABLET: 20 | 30 days supply | Qty: 30 | Fill #2

## 2017-02-07 ENCOUNTER — Ambulatory Visit: Payer: Self-pay

## 2017-02-19 MED FILL — AMLODIPINE BESYLATE 10 MG T: 10 | 30 days supply | Qty: 30 | Fill #3

## 2017-02-19 MED FILL — ?ATORVASTATIN 20MG TABLET: 20 | 30 days supply | Qty: 30 | Fill #3

## 2017-02-21 ENCOUNTER — Other Ambulatory Visit: Payer: Self-pay

## 2017-02-21 ENCOUNTER — Ambulatory Visit (INDEPENDENT_AMBULATORY_CARE_PROVIDER_SITE_OTHER): Payer: Self-pay | Admitting: Family Medicine

## 2017-02-21 VITALS — BP 132/74 | HR 97 | Temp 98.1°F | Ht 67.0 in | Wt 102.0 lb

## 2017-02-21 DIAGNOSIS — L853 Xerosis cutis: Secondary | ICD-10-CM

## 2017-02-21 HISTORY — DX: Xerosis cutis: L85.3

## 2017-02-21 NOTE — Assessment & Plan Note (Signed)
Unsure of the etiology of the rash.  Possibly xerosis that worsened in the wintertime.  Was sent for referral with diagnosis of possible psoriasis, however did not appear to follow classic psoriatic appearance in previous images or in documented distribution.  Nevertheless, did seem to improve with topical and oral steroids as discussed and patient history.  Advised patient to return to Derm clinic if rash represents.  Advised consistent moisturizing for dry skin as provided in AVS.

## 2017-02-21 NOTE — Patient Instructions (Addendum)
It was a pleasure to see you today! Thank you for choosing [facility] for your primary care. Melanie Harris was seen for rash. Be sure call the Dermatology clinic back if the rash recurs. Your likely diagnosis is xerosis (aka dry skin).   Moisturizer: Applying a moisturizer frequently throughout the day can help. It can make the skin softer, smoother, and less likely to crack. Body moisturizers come in a few forms - ointments, creams, lotions, and oils. Your dermatologist can tell you which is recommended for you.  For very dry skin, a moisturizer that contains urea or lactic acid may be helpful. These ingredients help the skin hold water. You can find these ingredients in both prescription moisturizers and those that you can buy without a prescription. A drawback is that these ingredients can sting if you have eczema or cracked skin.    Best,  Marny Lowenstein, MD, MS FAMILY MEDICINE RESIDENT - PGY1 02/21/2017 3:12 PM

## 2017-02-21 NOTE — Progress Notes (Signed)
    Subjective:  Melanie Harris is a 57 y.o. female who presents to the [facility] today with a chief complaint of rash along hands and arms.   HPI:  Rash She developed a rash back in October.  Started with her hands becoming dry, then progressing to itching with scaly rash then to some swelling.  It then progressed from hands to her arms. Pt has had rashes similar to this 1 in the past, but not for many years.  For rash, pt first saw PCP and was given a prescription for triamcinolone 0.1% cream to be applied as needed.  The following day she then went to ED, because it was getting worse.  Patient was given prednisone from the ED. Pt continued to use triamcinolone for 2 weeks and nearly resolved by [facility] November.  She now reports minimal, residual, red bump on hands and arms.  She is no longer needing to use topical steroids.  The patient did not notice anything making rash worse or better.  Denies any no new detergents, soaps at time of rash. No h/o allergies.   ROS: Per HPI   Objective:  Physical Exam: BP 132/74   Pulse 97   Temp 98.1 F (36.7 C) (Oral)   Ht 5\' 7"  (1.702 m)   Wt 102 lb (46.3 kg)   SpO2 96%   BMI 15.98 kg/m   Gen: NAD, resting comfortably CV: RRR with no murmurs appreciated Pulm: NWOB, CTAB with no crackles, wheezes, or rhonchi MSK: no edema, cyanosis, or clubbing noted Skin: warm, dry, no evidence of erythema, scaling, or lesions observed on palmar or dorsal aspects of hands and forearms bilaterally Neuro: grossly normal, moves all extremities Psych: Normal affect and thought content  No results found for this or any previous visit (from the past 72 hour(s)).   Assessment/Plan:  Xerosis of skin Unsure of the etiology of the rash.  Possibly xerosis that worsened in the wintertime.  Was sent for referral with diagnosis of possible psoriasis, however did not appear to follow classic psoriatic appearance in previous images or in documented distribution.  Nevertheless,  did seem to improve with topical and oral steroids as discussed and patient history.  Advised patient to return to Derm clinic if rash represents.  Advised consistent moisturizing for dry skin as provided in AVS.   Lab Orders  No laboratory test(s) ordered today    No orders of the defined types were placed in this encounter.   Marny Lowenstein, MD, [place] - PGY1 02/21/2017 4:41 PM

## 2017-03-19 MED FILL — ?AMLODIPINE BESYLATE 10MG T: 10 | 30 days supply | Qty: 30 | Fill #4

## 2017-03-19 MED FILL — ?ATORVASTATIN 20MG TABL: 20 | 30 days supply | Qty: 30 | Fill #4

## 2017-04-19 MED FILL — AMLODIPINE BESYLATE 10 MG T: 10 | 30 days supply | Qty: 30 | Fill #5

## 2017-04-19 MED FILL — ATORVASTATIN 20 MG TABLET: 20 | 30 days supply | Qty: 30 | Fill #5

## 2017-04-29 ENCOUNTER — Emergency Department (HOSPITAL_COMMUNITY)
Admission: EM | Admit: 2017-04-29 | Discharge: 2017-04-29 | Disposition: A | Discharge status: Home / Self Care | Payer: Self-pay | Attending: Emergency Medicine | Admitting: Emergency Medicine

## 2017-04-29 ENCOUNTER — Encounter (HOSPITAL_COMMUNITY): Payer: Self-pay | Admitting: Emergency Medicine

## 2017-04-29 ENCOUNTER — Emergency Department (HOSPITAL_COMMUNITY): Payer: Self-pay

## 2017-04-29 DIAGNOSIS — Z79899 Other long term (current) drug therapy: Secondary | ICD-10-CM | POA: Insufficient documentation

## 2017-04-29 DIAGNOSIS — Z7982 Long term (current) use of aspirin: Secondary | ICD-10-CM | POA: Insufficient documentation

## 2017-04-29 DIAGNOSIS — I10 Essential (primary) hypertension: Secondary | ICD-10-CM | POA: Insufficient documentation

## 2017-04-29 DIAGNOSIS — R002 Palpitations: Secondary | ICD-10-CM | POA: Insufficient documentation

## 2017-04-29 DIAGNOSIS — F17210 Nicotine dependence, cigarettes, uncomplicated: Secondary | ICD-10-CM | POA: Insufficient documentation

## 2017-04-29 LAB — CBC
HCT: 41.5 % (ref 36.0–46.0)
Hemoglobin: 14.2 g/dL (ref 12.0–15.0)
MCH: 32.1 pg (ref 26.0–34.0)
MCHC: 34.2 g/dL (ref 30.0–36.0)
MCV: 93.9 fL (ref 78.0–100.0)
Platelets: 333 10*3/uL (ref 150–400)
RBC: 4.42 MIL/uL (ref 3.87–5.11)
RDW: 13.8 % (ref 11.5–15.5)
WBC: 10 10*3/uL (ref 4.0–10.5)

## 2017-04-29 LAB — BASIC METABOLIC PANEL
ANION GAP: 14 (ref 5–15)
BUN: 10 mg/dL (ref 6–20)
CALCIUM: 9.8 mg/dL (ref 8.9–10.3)
CO2: 22 mmol/L (ref 22–32)
Chloride: 103 mmol/L (ref 101–111)
Creatinine, Ser: 0.57 mg/dL (ref 0.44–1.00)
GFR calc Af Amer: >60 mL/min (ref >60)
GLUCOSE: 117 mg/dL — AB (ref 65–99)
Potassium: 3.5 mmol/L (ref 3.5–5.1)
Sodium: 139 mmol/L (ref 135–145)

## 2017-04-29 LAB — I-STAT TROPONIN, ED
TROPONIN I, POC: 0 ng/mL (ref 0.00–0.08)
Troponin i, poc: 0 ng/mL (ref 0.00–0.08)

## 2017-04-29 LAB — I-STAT BETA HCG BLOOD, ED (MC, WL, AP ONLY): I-stat hCG, quantitative: <5 m[IU]/mL (ref <5)

## 2017-04-29 LAB — MAGNESIUM: Magnesium: 2 mg/dL (ref 1.7–2.4)

## 2017-04-29 NOTE — ED Provider Notes (Signed)
[facility] EMERGENCY DEPARTMENT Provider Note   CSN: [id number] Arrival date & time: 04/29/17  4008     History   Chief Complaint Chief Complaint  Patient presents with  . Palpitations    HPI Melanie Harris is a 57 y.o. female with a h/o of anxiety and HTN who presents to the emergency department with a chief complaint of sudden onset dyspnea and palpitations that began when she awoke this morning.  She reports that the current episode lasted until arrival in the emergency department.  She reports that she had a similar episode 1-2 days ago that lasted for a minute before resolving spontaneously.  She states that she had a few seconds of central chest pain that felt as if she was getting "pinched", which also resolved spontaneously.  No known aggravating or alleviating factors.  She denies fever, chills, leg swelling, dizziness, lightheadedness, cough, back pain, visual changes, rash, nausea, vomiting, diarrhea, jaw or left arm pain.  She is currently asymptomatic.  She is postmenopausal.  No recent surgery, immobilization, or long travel.  With cardiology and does not have a history of heart disease or arrhythmias.  The history is provided by the patient. No language interpreter was used.  Palpitations   Associated symptoms include chest pain and shortness of breath. Pertinent negatives include no fever, no abdominal pain, no nausea, no vomiting, no headaches, no back pain, no dizziness, no weakness and no cough.    Past Medical History:  Diagnosis Date  . Abscess of thigh    right  . Hypertension     Patient Active Problem List   Diagnosis Date Noted  . Xerosis of skin 02/21/2017  . Low grade squamous intraepithelial lesion (LGSIL) on cervical Pap smear 07/03/2016  . Pure hypercholesterolemia 12/22/2015  . Anxiety state 12/21/2015  . Other emphysema ([place]) 08/12/2013  . Cough 08/12/2013  . Smoking 08/12/2013  . Essential hypertension 08/12/2013  .  Rash 08/21/2011  . Abscess of thigh   . Abscess of right thigh 07/25/2011    Past Surgical History:  Procedure Laterality Date  . ABDOMINAL HYSTERECTOMY  2004  . TONSILLECTOMY  10-11 yrs old     OB History    Gravida  1   Para  1   Term      Preterm      AB      Living  1     SAB      TAB      Ectopic      Multiple      Live Births  1            Home Medications    Prior to Admission medications   Medication Sig Start Date End Date Taking? Authorizing Provider  amLODipine (NORVASC) 10 MG tablet Take 1 tablet (10 mg total) by mouth daily. 10/10/16  Yes Charlott Rakes, MD  aspirin EC 81 MG tablet Take 81 mg by mouth daily.   Yes [provider]  atorvastatin (LIPITOR) 20 MG tablet Take 1 tablet (20 mg total) by mouth daily. 10/10/16  Yes Charlott Rakes, MD  carboxymethylcellulose (REFRESH PLUS) 0.5 % SOLN Place 1 drop into both eyes 3 (three) times daily as needed (dry eyes).   Yes [provider]  ibuprofen (ADVIL,MOTRIN) 200 MG tablet Take 400 mg by mouth every 6 (six) hours as needed for moderate pain.   Yes [provider]  Multiple Vitamin (MULTIVITAMIN) capsule Take 1 capsule by mouth daily.  Yes [provider]  sodium chloride (OCEAN) 0.65 % SOLN nasal spray Place 1 spray into both nostrils as needed for congestion.   Yes [provider]  Vitamin D, Ergocalciferol, 2000 units CAPS Take 2,000 Units by mouth daily.   Yes [provider]  buPROPion (ZYBAN) 150 MG 12 hr tablet Take 1 tablet (150 mg total) by mouth 2 (two) times daily. For smoking cessation Patient not taking: Reported on 04/29/2017 10/10/16   Charlott Rakes, MD  triamcinolone cream (KENALOG) 0.1 % Apply 1 application topically 2 (two) times daily. Patient not taking: Reported on 04/29/2017 11/06/16   Charlott Rakes, MD    Family History Family History  Problem Relation Age of Onset  . Heart failure Mother   . Lung cancer Father      Social History Social History   Tobacco Use  . Smoking status: Current Every Day Smoker    Packs/day: 0.25    Years: 30.00    Pack years: 7.50    Types: Cigarettes  . Smokeless tobacco: Never Used  . Tobacco comment: 1-3 cigs daily  Substance Use Topics  . Alcohol use: Yes    Alcohol/week: 0.6 oz    Types: 1 Glasses of wine per week    Comment: occassional  . Drug use: No     Allergies   Doxycycline   Review of Systems Review of Systems  Constitutional: Negative for activity change, chills and fever.  HENT: Negative for congestion.   Eyes: Negative for visual disturbance.  Respiratory: Positive for shortness of breath. Negative for cough.   Cardiovascular: Positive for chest pain and palpitations. Negative for leg swelling.  Gastrointestinal: Negative for abdominal pain, diarrhea, nausea and vomiting.  Genitourinary: Negative for dysuria.  Musculoskeletal: Negative for back pain.  Skin: Negative for rash.  Allergic/Immunologic: Negative for immunocompromised state.  Neurological: Negative for dizziness, syncope, weakness, light-headedness and headaches.  Psychiatric/Behavioral: Negative for confusion.     Physical Exam Updated Vital Signs BP 120/66   Pulse 79   Resp 17   SpO2 99%   Physical Exam  Constitutional: She appears well-developed and well-nourished. No distress.  HENT:  Head: Normocephalic.  Eyes: Conjunctivae are normal. Right eye exhibits no discharge. Left eye exhibits no discharge. No scleral icterus.  Neck: Normal range of motion. Neck supple. No JVD present.  Cardiovascular: Normal rate, regular rhythm, normal heart sounds and intact distal pulses. Exam reveals no gallop and no friction rub.  No murmur heard. Pulmonary/Chest: Effort normal and breath sounds normal. No stridor. No respiratory distress. She has no wheezes. She has no rales. She exhibits no tenderness.  Abdominal: Soft. She exhibits no distension and no mass. There is no  tenderness. There is no rebound and no guarding. No hernia.  Musculoskeletal: Normal range of motion. She exhibits no edema, tenderness or deformity.  Neurological: She is alert.  Skin: Skin is warm. Capillary refill takes less than 2 seconds. No rash noted. She is not diaphoretic. No pallor.  Psychiatric: Her behavior is normal.  Nursing note and vitals reviewed.  ED Treatments / Results  Labs (all labs ordered are listed, but only abnormal results are displayed) Labs Reviewed  BASIC METABOLIC PANEL - Abnormal; Notable for the following components:      Result Value   Glucose, Bld 117 (*)    All other components within normal limits  CBC  MAGNESIUM  I-STAT TROPONIN, ED  I-STAT BETA HCG BLOOD, ED ([facility], [facility], [facility] ONLY)  I-STAT TROPONIN, ED  EKG EKG Interpretation  Date/Time:  Monday April 29 2017 09:02:03 EDT Ventricular Rate:  113 PR Interval:  134 QRS Duration: 80 QT Interval:  342 QTC Calculation: 469 R Axis:   90 Text Interpretation:  Sinus tachycardia with Premature atrial complexes Rightward axis Borderline ECG Since last tracing rate faster Confirmed by Noemi Chapel [phone]) on 04/29/2017 11:29:44 AM   Radiology Dg Chest 2 View  Result Date: 04/29/2017 CLINICAL DATA:  Tachycardia, shortness of breath. EXAM: CHEST - 2 VIEW COMPARISON:  CT chest and chest radiograph 12/10/2015. FINDINGS: Trachea is midline. Heart size normal. Lungs are hyperinflated. Mild biapical pleuroparenchymal scarring. Lungs are otherwise clear. No pleural fluid. IMPRESSION: Hyperinflation without acute finding. Electronically Signed   By: Lorin Picket M.D.   On: 04/29/2017 09:38    Procedures Procedures (including critical care time)  Medications Ordered in ED Medications - No data to display   Initial Impression / Assessment and Plan / ED Course  I have reviewed the triage vital signs and the nursing notes.  Pertinent labs & imaging results that were available during my care of the  patient were reviewed by me and considered in my medical decision making (see chart for details).     57 year old female with a history of anxiety and hypertension presenting with palpitations, dyspnea, chest pain.  EKG was sinus tachycardia in the 110s and few PACs.  Troponin is negative.  CBC and CMP are unremarkable.  Tachycardia has resolved heart rate is now in the 70s.  She is otherwise hemodynamically stable.  CXR with hyperinflation, but otherwise unremarkable.  Repeat troponin is negative.  At this time, doubt PE, ACS, cardiac tamponade, myocarditis, pericarditis, or pneumothorax.  The patient has been discussed with Dr. Sabra Heck, attending physician.  Will discharge the patient to home with Valsalva maneuvers and follow-up to cardiology for possible Holter monitoring.  She is hemodynamically stable and in no acute distress at this time.  She has been given strict return precautions to the emergency department.  The patient is safe for out patient follow-up at this time.  Final Clinical Impressions(s) / ED Diagnoses   Final diagnoses:  Palpitations    ED Discharge Orders    None       Joanne Gavel, PA-C 04/29/17 1717    Noemi Chapel, MD 04/29/17 2130

## 2017-04-29 NOTE — ED Triage Notes (Signed)
Pt states after waking up this morning feeling like her heart was racing with sob. Denies any chest pain.

## 2017-04-29 NOTE — ED Provider Notes (Signed)
Medical screening examination/treatment/procedure(s) were conducted as a shared visit with non-physician practitioner(s) and myself.  I personally evaluated the patient during the encounter.  Clinical Impression:   Final diagnoses:  None     EKG Interpretation  Date/Time:  Monday April 29 2017 09:02:03 EDT Ventricular Rate:  113 PR Interval:  134 QRS Duration: 80 QT Interval:  342 QTC Calculation: 469 R Axis:   90 Text Interpretation:  Sinus tachycardia with Premature atrial complexes Rightward axis Borderline ECG Since last tracing rate faster Confirmed by Noemi Chapel [id number]) on 04/29/2017 11:29:44 AM      The pt is a 57 year old female, there is a history this morning of some palpitations, she feels like this might happen a couple days ago as well, prior to arrival the palpitations resolved.  She denies any chest pain shortness of breath or coughing, she still smokes approximately 10 cigarettes a day.  She denies any peripheral edema.  On my exam the patient has a normal heart rate, normal rhythm, her heart rate is 95 on my exam with normal pulses no edema no JVD.  Lungs are clear, work-up in pursuit of source of palpitations.  She denies any stimulant use, drug use, increasing alcohol use or caffeine use though she does drink a couple glasses of wine a night and drinks coffee in the morning.  No prior history of cardiac disease  Anticipate discharge if negative.  Patient will need Holter monitor testing in the outpatient setting.   Noemi Chapel, MD 04/29/17 2130

## 2017-04-29 NOTE — ED Notes (Signed)
Pt stable, ambulatory, and verbalizes understanding of d/c instructions.  

## 2017-04-29 NOTE — Discharge Instructions (Addendum)
Please call to schedule follow-up appointment with cardiology regarding your visit today.  Please also follow-up with your primary care provider to have your thyroid hormones rechecked.  If you feel as if your heart is beating out of your chest, you can try some of the Valsalva maneuvers that we discussed in the emergency department.  You can cover your mouth and nose and try bearing down as if you are having a bowel movement.  Sometimes this can resolve the symptoms.  If you develop new or worsening symptoms including palpitations with chest pain, shortness of breath, sweating, numbness or weakness, or other new concerning symptoms, please return to the emergency department for reevaluation.

## 2017-05-06 MED FILL — BUPROPION SR 150 MG TABLET: 150 | 30 days supply | Qty: 60 | Fill #1

## 2017-05-17 ENCOUNTER — Encounter: Payer: Self-pay | Admitting: Cardiology

## 2017-05-22 MED FILL — ATORVASTATIN 20 MG TABLET: 20 | 30 days supply | Qty: 30 | Fill #6

## 2017-05-22 MED FILL — AMLODIPINE BESYLATE 10 MG T: 10 | 30 days supply | Qty: 30 | Fill #6

## 2017-05-26 DIAGNOSIS — R002 Palpitations: Secondary | ICD-10-CM | POA: Insufficient documentation

## 2017-05-26 NOTE — Progress Notes (Addendum)
Cardiology Office Note:    Date:  05/27/2017   ID:  Melanie Harris, DOB 09-Dec-1960, MRN [id number]  PCP:  Charlott Rakes, MD  Cardiologist:   Lauree Chandler, MD  -New  Referring MD: Charlott Rakes, MD   Chief Complaint  Patient presents with  . Palpitations    History of Present Illness:    Melanie Harris is a 57 y.o. female who is being seen today for the evaluation of palpitations at the request of Charlott Rakes, MD.   The patient has a past medical history significant for tobacco use, hypercholesterolemia, hypertension and anxiety.   [name] was seen at the [facility] emergency department on 04/29/17 with complaints of palpitations that were intermittent for the prior couple of days.  She denied any chest pain, shortness of breath, coughing or edema.  She was noted to smoke about half a pack per day.  Denies any stimulant use, drug use, increasing alcohol use though she did admit to drinking a couple of glasses of wine a night and drinks coffee in the morning.  She has no history of cardiac disease.  The source of her palpitations was found.  An EKG showed sinus tachycardia at 113 bpm with PACs.  Troponin was 0.00 on exam she was noted to have a heart rate of 95.  He was referred to cardiology by the emergency room physician for evaluation of palpitations and probable Holter monitor.  The patient had experienced a fast, regular heart beat about 15 minutes after she first got up in the morning. She also had some associated mild shortness of breath. She had no dizziness, syncope, chest discomfort.  Since then she is still feeling palpitations when she is anxious like getting stuck in traffic. She feels it usually at least once a day.   She thinks she may have had this once about 2 years ago. She was drinking about 3 cups of coffee in the monrnings and tea for lunch and dinner. Since her ER visit she has tried to cut back on caffeine and is now drinking 1/2 caf coffee and  has cut out the tea with dinner. She feels her heart beating fast about 30 minutes after drinking tea.   She has no personal history of cardiac issues. Her mother had heart failure diagnosed in her 26's, other wise no other cardiac family history. She smokes about 10 cigarettes per day. She drinks 1-2 glasses of wine about every other day with dinner. She feels that she has always been somewhat anxious but has been more anxious lately. She works at [employer] and feels rushed all the time. She has a hard time relaxing after work. She notes that she was found to have some thyroid nodules on ultrasound in 02/2016 and has had no recent thyroid testing. She has lost a few pounds, now only 99 lbs and appears very thin. She has been trying to gain weight by eating peanut butter and avocados and high energy foods without any success.  She does not have trouble sleeping or changes in hair/skin or bowel habits.   She is aware of the hazards of smoking and would like to quit. She has been prescribed wellbutrin, but after reading the potential side effects she has been afraid to take it.    No flowsheet data found.   Past Medical History:  Diagnosis Date  . Abscess of right thigh 07/25/2011  . Abscess of thigh    right  . Anxiety state 12/21/2015  .  Cough 08/12/2013  . Hypertension   . Low grade squamous intraepithelial lesion (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 Pathology negative 06/2016: LSIL/no hpv testing done  . Other emphysema ([place]) 08/12/2013  . Pure hypercholesterolemia 12/22/2015  . Rash 08/21/2011  . Smoking 08/12/2013  . Xerosis of skin 02/21/2017    Past Surgical History:  Procedure Laterality Date  . ABDOMINAL HYSTERECTOMY  2004  . TONSILLECTOMY  [age] yrs old    Current Medications: Current Meds  Medication Sig  . amLODipine (NORVASC) 10 MG tablet Take 1 tablet (10 mg total) by mouth daily.  Marland Kitchen aspirin EC 81 MG  tablet Take 81 mg by mouth daily.  Marland Kitchen atorvastatin (LIPITOR) 20 MG tablet Take 1 tablet (20 mg total) by mouth daily.  Marland Kitchen buPROPion (ZYBAN) 150 MG 12 hr tablet Take 1 tablet (150 mg total) by mouth 2 (two) times daily. For smoking cessation  . carboxymethylcellulose (REFRESH PLUS) 0.5 % SOLN Place 1 drop into both eyes 3 (three) times daily as needed (dry eyes).  Marland Kitchen ibuprofen (ADVIL,MOTRIN) 200 MG tablet Take 400 mg by mouth every 6 (six) hours as needed for moderate pain.  . Multiple Vitamin (MULTIVITAMIN) capsule Take 1 capsule by mouth daily.  . sodium chloride (OCEAN) 0.65 % SOLN nasal spray Place 1 spray into both nostrils as needed for congestion.  . triamcinolone cream (KENALOG) 0.1 % Apply 1 application topically 2 (two) times daily.  . Vitamin D, Ergocalciferol, 2000 units CAPS Take 2,000 Units by mouth daily.     Allergies:   Doxycycline   Social History   Socioeconomic History  . Marital status: Divorced    Spouse name: Not on file  . Number of children: Not on file  . Years of education: Not on file  . Highest education level: Not on file  Occupational History  . Not on file  Social Needs  . Financial resource strain: Not on file  . Food insecurity:    Worry: Not on file    Inability: Not on file  . Transportation needs:    Medical: Not on file    Non-medical: Not on file  Tobacco Use  . Smoking status: Current Every Day Smoker    Packs/day: 0.25    Years: 30.00    Pack years: 7.50    Types: Cigarettes  . Smokeless tobacco: Never Used  . Tobacco comment: 1-3 cigs daily  Substance and Sexual Activity  . Alcohol use: Yes    Alcohol/week: 0.6 oz    Types: 1 Glasses of wine per week    Comment: occassional  . Drug use: No  . Sexual activity: Not Currently  Lifestyle  . Physical activity:    Days per week: Not on file    Minutes per session: Not on file  . Stress: Not on file  Relationships  . Social connections:    Talks on phone: Not on file    Gets  together: Not on file    Attends religious service: Not on file    Active member of club or organization: Not on file    Attends meetings of clubs or organizations: Not on file    Relationship status: Not on file  Other Topics Concern  . Not on file  Social History Narrative  . Not on file     Family History: The patient's family history includes Diabetes in her sister; Dysrhythmia in her  sister; Healthy in her daughter; Heart failure in her mother; Hypercholesterolemia in her father; Hyperlipidemia in her mother; Hypertension in her father and mother; Lung cancer in her father. ROS:   Please see the history of present illness.     All other systems reviewed and are negative.  EKGs/Labs/Other Studies Reviewed:    The following studies were reviewed today: None available  EKG:  EKG is not ordered today.  The ekg form the ED on 04/30/17 demonstrates sinus tachycardia 113 bpm with PAC. No ischemic changes  Recent Labs: 10/17/2016: ALT 23 04/29/2017: BUN 10; Creatinine, Ser 0.57; Hemoglobin 14.2; Magnesium 2.0; Platelets 333; Potassium 3.5; Sodium 139   Recent Lipid Panel    Component Value Date/Time   CHOL 168 10/17/2016 0852   TRIG 113 10/17/2016 0852   HDL 93 10/17/2016 0852   CHOLHDL 1.8 10/17/2016 0852   CHOLHDL 1.7 12/21/2015 1230   VLDL 14 12/21/2015 1230   LDLCALC 52 10/17/2016 0852    Physical Exam:    VS:  BP (!) 146/80   Pulse (!) 105   Ht 5\' 7"  (1.702 m)   Wt 99 lb 6.4 oz (45.1 kg)   SpO2 97%   BMI 15.57 kg/m     Wt Readings from Last 3 Encounters:  05/27/17 99 lb 6.4 oz (45.1 kg)  02/21/17 102 lb (46.3 kg)  11/20/16 101 lb (45.8 kg)     GEN:  Well nourished, well developed in no acute distress HEENT: Normal NECK: No JVD; No carotid bruits LYMPHATICS: No lymphadenopathy CARDIAC: Regular rhythm, tachycardia, no murmurs, rubs, gallops RESPIRATORY:  Clear to auscultation without rales, wheezing or rhonchi  ABDOMEN: Soft, non-tender,  non-distended MUSCULOSKELETAL:  No edema; No deformity  SKIN: Warm and dry NEUROLOGIC:  Alert and oriented x 3 PSYCHIATRIC:  Normal affect   ASSESSMENT:    1. Essential hypertension   2. Palpitations   3. Pure hypercholesterolemia   4. Smoking    PLAN:    This patient's case was discussed in depth with Dr. Angelena Form. The plan below was formulated per our discussion.  In order of problems listed above:  Palpitations: Intermittent fast, regular heart beating with mild shortness of breath. Seems to be worse after drinking caffinated tea. No chest discomfort or exertional symptoms. Increased anxiety and difficulty gaining wt. No previously diagnosed cardiac issues. Has hx of thyroid nodules. Will check TSH and free T4 for possible hyperthyroidism causing her tachycardiac and palpitations. Will also check echo for presence of structural heart abnormalities that could be contributing. Will order 48 hour holter monitor to assess the palpitations. If she is found to have thyroid abnormality that would be the likely origin of her palpitations, could cancel the monitor. Will start metoprolol 25 mg BID for heart rate control and suppression of palpitations.Adised to continue to try to reduce caffeine intake.   Hypertension: On amlodipine 10 mg. BP slightly elevated. Adding metoprolol for tachycardia and palpitations.   Hypercholesterolemia: Lipid panel on 10/17/16 showed LDL of 52 On atorvastatin 20 mg. Continue current therapy.  Tobacco use: Smokes about 1/2 PPD and would like to quit. Has been prescribed wellbutrin but was reluctant to take it. We discussed first making a plan for cessation including how she will handle the cravings behaviorally. She can then first see how she does with the metoprolol and then try the Wellbutrin in a week or 2 along with the behavioral changes. She is encourage to set a quit date for 2-3 weeks after starting the Wellbutrin.  Smoking cessation counseling provided for 8  minutes.   Medication Adjustments/Labs and Tests Ordered: Current medicines are reviewed at length with the patient today.  Concerns regarding medicines are outlined above. Labs and tests ordered and medication changes are outlined in the patient instructions below:  Patient Instructions  Medication Instructions:  1) START Metoprolol Tartrate 25mg  twice daily  Labwork: TSH and Free T4 today  Testing/Procedures: Your physician has requested that you have an echocardiogram. Echocardiography is a painless test that uses sound waves to create images of your heart. It provides your doctor with information about the size and shape of your heart and how well your heart's chambers and valves are working. This procedure takes approximately one hour. There are no restrictions for this procedure.  Your physician has recommended that you wear a 48 hour holter monitor. Holter monitors are medical devices that record the heart's electrical activity. Doctors most often use these monitors to diagnose arrhythmias. Arrhythmias are problems with the speed or rhythm of the heartbeat. The monitor is a small, portable device. You can wear one while you do your normal daily activities. This is usually used to diagnose what is causing palpitations/syncope (passing out).    Follow-Up: Follow up will be based on test results.    Any Other Special Instructions Will Be Listed Below (If Applicable).     If you need a refill on your cardiac medications before your next appointment, please call your pharmacy.      Signed, Daune Perch, NP  05/27/2017 12:21 PM    [facility]

## 2017-05-27 ENCOUNTER — Encounter: Payer: Self-pay | Admitting: Cardiology

## 2017-05-27 ENCOUNTER — Ambulatory Visit (INDEPENDENT_AMBULATORY_CARE_PROVIDER_SITE_OTHER): Payer: Self-pay | Admitting: Cardiology

## 2017-05-27 ENCOUNTER — Telehealth: Payer: Self-pay | Admitting: Cardiology

## 2017-05-27 VITALS — BP 146/80 | HR 105 | Ht 67.0 in | Wt 99.4 lb

## 2017-05-27 DIAGNOSIS — I10 Essential (primary) hypertension: Secondary | ICD-10-CM

## 2017-05-27 DIAGNOSIS — E7800 Pure hypercholesterolemia, unspecified: Secondary | ICD-10-CM

## 2017-05-27 DIAGNOSIS — F17200 Nicotine dependence, unspecified, uncomplicated: Secondary | ICD-10-CM

## 2017-05-27 DIAGNOSIS — R002 Palpitations: Secondary | ICD-10-CM

## 2017-05-27 LAB — T4, FREE: Free T4: 1.24 ng/dL (ref 0.82–1.77)

## 2017-05-27 LAB — TSH: TSH: 1.06 u[IU]/mL (ref 0.450–4.500)

## 2017-05-27 MED ORDER — METOPROLOL TARTRATE 25 MG PO TABS: 25.0000 mg | ORAL_TABLET | Freq: Two times a day (BID) | ORAL | 3 refills | Status: DC

## 2017-05-27 MED FILL — METOPROLOL TARTRATE 25 MG T: 25 | 30 days supply | Qty: 60 | Fill #0

## 2017-05-27 NOTE — Patient Instructions (Signed)
Medication Instructions:  1) START Metoprolol Tartrate 25mg  twice daily  Labwork: TSH and Free T4 today  Testing/Procedures: Your physician has requested that you have an echocardiogram. Echocardiography is a painless test that uses sound waves to create images of your heart. It provides your doctor with information about the size and shape of your heart and how well your heart's chambers and valves are working. This procedure takes approximately one hour. There are no restrictions for this procedure.  Your physician has recommended that you wear a 48 hour holter monitor. Holter monitors are medical devices that record the heart's electrical activity. Doctors most often use these monitors to diagnose arrhythmias. Arrhythmias are problems with the speed or rhythm of the heartbeat. The monitor is a small, portable device. You can wear one while you do your normal daily activities. This is usually used to diagnose what is causing palpitations/syncope (passing out).    Follow-Up: Follow up will be based on test results.    Any Other Special Instructions Will Be Listed Below (If Applicable).     If you need a refill on your cardiac medications before your next appointment, please call your pharmacy.

## 2017-05-28 NOTE — Telephone Encounter (Signed)
Entered in error

## 2017-05-31 ENCOUNTER — Ambulatory Visit (HOSPITAL_COMMUNITY): Payer: Self-pay | Attending: Cardiovascular Disease

## 2017-05-31 ENCOUNTER — Ambulatory Visit (INDEPENDENT_AMBULATORY_CARE_PROVIDER_SITE_OTHER): Payer: Self-pay

## 2017-05-31 ENCOUNTER — Ambulatory Visit: Payer: Self-pay | Attending: Family Medicine | Admitting: Family Medicine

## 2017-05-31 ENCOUNTER — Other Ambulatory Visit: Payer: Self-pay

## 2017-05-31 ENCOUNTER — Encounter: Payer: Self-pay | Admitting: Family Medicine

## 2017-05-31 VITALS — BP 146/74 | HR 75 | Temp 97.5°F | Ht 67.0 in | Wt 99.2 lb

## 2017-05-31 DIAGNOSIS — E7800 Pure hypercholesterolemia, unspecified: Secondary | ICD-10-CM | POA: Insufficient documentation

## 2017-05-31 DIAGNOSIS — E785 Hyperlipidemia, unspecified: Secondary | ICD-10-CM | POA: Insufficient documentation

## 2017-05-31 DIAGNOSIS — R002 Palpitations: Secondary | ICD-10-CM

## 2017-05-31 DIAGNOSIS — I10 Essential (primary) hypertension: Secondary | ICD-10-CM | POA: Insufficient documentation

## 2017-05-31 DIAGNOSIS — Z1211 Encounter for screening for malignant neoplasm of colon: Secondary | ICD-10-CM

## 2017-05-31 DIAGNOSIS — Z881 Allergy status to other antibiotic agents status: Secondary | ICD-10-CM | POA: Insufficient documentation

## 2017-05-31 DIAGNOSIS — F17210 Nicotine dependence, cigarettes, uncomplicated: Secondary | ICD-10-CM | POA: Insufficient documentation

## 2017-05-31 DIAGNOSIS — Z720 Tobacco use: Secondary | ICD-10-CM | POA: Insufficient documentation

## 2017-05-31 DIAGNOSIS — F17200 Nicotine dependence, unspecified, uncomplicated: Secondary | ICD-10-CM

## 2017-05-31 DIAGNOSIS — Z7982 Long term (current) use of aspirin: Secondary | ICD-10-CM | POA: Insufficient documentation

## 2017-05-31 DIAGNOSIS — Z79899 Other long term (current) drug therapy: Secondary | ICD-10-CM | POA: Insufficient documentation

## 2017-05-31 LAB — ECHOCARDIOGRAM COMPLETE
Height: 67 in
Weight: 1587.2 oz

## 2017-05-31 MED ORDER — BUPROPION HCL ER (SMOKING DET) 150 MG PO TB12: 150.0000 mg | ORAL_TABLET | Freq: Two times a day (BID) | ORAL | 3 refills | Status: DC

## 2017-05-31 MED ORDER — AMLODIPINE BESYLATE 10 MG PO TABS: 10.0000 mg | ORAL_TABLET | Freq: Every day | ORAL | 6 refills | Status: DC

## 2017-05-31 MED ORDER — METOPROLOL SUCCINATE ER 25 MG PO TB24: 25.0000 mg | ORAL_TABLET | Freq: Every day | ORAL | 6 refills | Status: DC

## 2017-05-31 MED ORDER — ATORVASTATIN CALCIUM 20 MG PO TABS: 20.0000 mg | ORAL_TABLET | Freq: Every day | ORAL | 6 refills | Status: DC

## 2017-05-31 MED FILL — METOPROLOL SUCCINATE ER 25: 25 | 30 days supply | Qty: 30 | Fill #0

## 2017-05-31 NOTE — Patient Instructions (Signed)
Bupropion sustained-release tablets (smoking cessation)  What is this medicine?  BUPROPION (byoo PROE pee on) is used to help people quit smoking.  This medicine may be used for other purposes; ask your health care provider or pharmacist if you have questions.  COMMON BRAND NAME(S): Buproban, Zyban  What should I tell my health care provider before I take this medicine?  They need to know if you have any of these conditions:  -an eating disorder, such as anorexia or bulimia  -bipolar disorder or psychosis  -diabetes or high blood sugar, treated with medication  -glaucoma  -head injury or brain tumor  -heart disease, previous heart attack, or irregular heart beat  -high blood pressure  -kidney or liver disease  -seizures  -suicidal thoughts or a previous suicide attempt  -Tourette's syndrome  -weight loss  -an unusual or allergic reaction to bupropion, other medicines, foods, dyes, or preservatives  -breast-feeding  -pregnant or trying to become pregnant  How should I use this medicine?  Take this medicine by mouth with a glass of water. Follow the directions on the prescription label. You can take it with or without food. If it upsets your stomach, take it with food. Do not cut, crush or chew this medicine. Take your medicine at regular intervals. If you take this medicine more than once a day, take your second dose at least 8 hours after you take your first dose. To limit difficulty in sleeping, avoid taking this medicine at bedtime. Do not take your medicine more often than directed. Do not stop taking this medicine suddenly except upon the advice of your doctor. Stopping this medicine too quickly may cause serious side effects.  A special MedGuide will be given to you by the pharmacist with each prescription and refill. Be sure to read this information carefully each time.  Talk to your pediatrician regarding the use of this medicine in children. Special care may be needed.  Overdosage: If you think you have  taken too much of this medicine contact a poison control center or emergency room at once.  NOTE: This medicine is only for you. Do not share this medicine with others.  What if I miss a dose?  If you miss a dose, skip the missed dose and take your next tablet at the regular time. There should be at least 8 hours between doses. Do not take double or extra doses.  What may interact with this medicine?  Do not take this medicine with any of the following medications:  -linezolid  -MAOIs like Azilect, Carbex, Eldepryl, Marplan, Nardil, and Parnate  -methylene blue (injected into a vein)  -other medicines that contain bupropion like Wellbutrin  This medicine may also interact with the following medications:  -alcohol  -certain medicines for anxiety or sleep  -certain medicines for blood pressure like metoprolol, propranolol  -certain medicines for depression or psychotic disturbances  -certain medicines for HIV or AIDS like efavirenz, lopinavir, nelfinavir, ritonavir  -certain medicines for irregular heart beat like propafenone, flecainide  -certain medicines for Parkinson's disease like amantadine, levodopa  -certain medicines for seizures like carbamazepine, phenytoin, phenobarbital  -cimetidine  -clopidogrel  -cyclophosphamide  -digoxin  -furazolidone  -isoniazid  -nicotine  -orphenadrine  -procarbazine  -steroid medicines like prednisone or cortisone  -stimulant medicines for attention disorders, weight loss, or to stay awake  -tamoxifen  -theophylline  -thiotepa  -ticlopidine  -tramadol  -warfarin  This list may not describe all possible interactions. Give your health care provider a list   patient support program. It is important to participate in a behavioral program, counseling, or other support program that is recommended by your health care professional. Patients and their families should watch out for new or worsening thoughts of suicide or depression. Also watch out for sudden changes in feelings such as feeling anxious, agitated, panicky, irritable, hostile, aggressive, impulsive, severely restless, overly excited and hyperactive, or not being able to sleep. If this happens, especially at the beginning of treatment or after a change in dose, call your health care professional. Avoid alcoholic drinks while taking this medicine. Drinking excessive alcoholic beverages, using sleeping or anxiety medicines, or quickly stopping the use of these agents while taking this medicine may increase your risk for a seizure. Do not drive or use heavy machinery until you know how this medicine affects you. This medicine can impair your ability to perform these tasks. Do not take this medicine close to bedtime. It may prevent you from sleeping. Your mouth may get dry. Chewing sugarless gum or sucking hard candy, and drinking plenty of water may help. Contact your doctor if the problem does not go away or is severe. Do not use nicotine patches or chewing gum without the advice of your doctor or health care professional while taking this medicine. You may need to have your blood pressure taken regularly if your doctor recommends that you use both nicotine and this medicine together. What side effects may I notice from receiving this medicine? Side effects that you should report to your doctor or health care professional as soon as possible: -allergic reactions like skin rash, itching or hives, swelling of the face, lips, or tongue -breathing problems -changes in vision -confusion -elevated mood, decreased need for sleep, racing thoughts, impulsive  behavior -fast or irregular heartbeat -hallucinations, loss of contact with reality -increased blood pressure -redness, blistering, peeling or loosening of the skin, including inside the mouth -seizures -suicidal thoughts or other mood changes -unusually weak or tired -vomiting Side effects that usually do not require medical attention (report to your doctor or health care professional if they continue or are bothersome): -constipation -headache -loss of appetite -nausea -tremors -weight loss This list may not describe all possible side effects. Call your doctor for medical advice about side effects. You may report side effects to FDA at 1-800-FDA-1088. Where should I keep my medicine? Keep out of the reach of children. Store at room temperature between 20 and 25 degrees C (68 and 77 degrees F). Protect from light. Keep container tightly closed. Throw away any unused medicine after the expiration date. NOTE: This sheet is a summary. It may not cover all possible information. If you have questions about this medicine, talk to your doctor, pharmacist, or health care provider.  2018 Elsevier/Gold Standard (2015-07-15 13:49:28)  

## 2017-05-31 NOTE — Progress Notes (Signed)
Subjective:  Patient ID: Melanie Harris, female    DOB: [birth year]  Age: 57 y.o. MRN: [id number]  CC: Thyroid Problem   HPI Melanie Harris is a 57 year old female with hypertension, hyperlipidemia, tobacco abuse who presents today for follow-up visit. Endorses compliance with her antihypertensive and her statin and denies adverse effects. She was seen by cardiology on 05/27/2017 for evaluation of palpitations and thyroid panel was normal.  She was commenced on metoprolol with plans for a Holter monitor and echocardiogram.  Her heart rate has improved since commencing metoprolol but she states she would like an extended release as she has difficulty with taking the second dose later on in the day. She was placed on Wellbutrin to help with smoking cessation but has not been taking it for the last couple of months and currently smokes 5 to 6 cigarettes/day. She is interested in receiving a refill to help quit.  Past Medical History:  Diagnosis Date  . Abscess of right thigh 07/25/2011  . Abscess of thigh    right  . Anxiety state 12/21/2015  . Cough 08/12/2013  . Hypertension   . Low grade squamous intraepithelial lesion (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 Pathology negative 06/2016: LSIL/no hpv testing done  . Other emphysema ([place]) 08/12/2013  . Pure hypercholesterolemia 12/22/2015  . Rash 08/21/2011  . Smoking 08/12/2013  . Xerosis of skin 02/21/2017    Past Surgical History:  Procedure Laterality Date  . ABDOMINAL HYSTERECTOMY  2004  . TONSILLECTOMY  10-11 yrs old    Allergies  Allergen Reactions  . Doxycycline Rash     Outpatient Medications Prior to Visit  Medication Sig Dispense Refill  . carboxymethylcellulose (REFRESH PLUS) 0.5 % SOLN Place 1 drop into both eyes 3 (three) times daily as needed (dry eyes).    Marland Kitchen ibuprofen (ADVIL,MOTRIN) 200 MG tablet Take 400 mg by mouth every 6 (six) hours as  needed for moderate pain.    . Multiple Vitamin (MULTIVITAMIN) capsule Take 1 capsule by mouth daily.    . sodium chloride (OCEAN) 0.65 % SOLN nasal spray Place 1 spray into both nostrils as needed for congestion.    . Vitamin D, Ergocalciferol, 2000 units CAPS Take 2,000 Units by mouth daily.    Marland Kitchen amLODipine (NORVASC) 10 MG tablet Take 1 tablet (10 mg total) by mouth daily. 30 tablet 6  . atorvastatin (LIPITOR) 20 MG tablet Take 1 tablet (20 mg total) by mouth daily. 30 tablet 6  . metoprolol tartrate (LOPRESSOR) 25 MG tablet Take 1 tablet (25 mg total) by mouth 2 (two) times daily. 180 tablet 3  . aspirin EC 81 MG tablet Take 81 mg by mouth daily.    Marland Kitchen triamcinolone cream (KENALOG) 0.1 % Apply 1 application topically 2 (two) times daily. (Patient not taking: Reported on 05/31/2017) 80 g 1  . buPROPion (ZYBAN) 150 MG 12 hr tablet Take 1 tablet (150 mg total) by mouth 2 (two) times daily. For smoking cessation (Patient not taking: Reported on 05/31/2017) 60 tablet 3   No facility-administered medications prior to visit.     ROS Review of Systems  Constitutional: Negative for activity change, appetite change and fatigue.  HENT: Negative for congestion, sinus pressure and sore throat.   Eyes: Negative for visual disturbance.  Respiratory: Negative for cough, chest tightness, shortness of breath and wheezing.   Cardiovascular: Negative for chest pain and palpitations.  Gastrointestinal: Negative for abdominal distention, abdominal pain and constipation.  Endocrine: Negative for polydipsia.  Genitourinary: Negative for dysuria and frequency.  Musculoskeletal: Negative for arthralgias and back pain.  Skin: Negative for rash.  Neurological: Negative for tremors, light-headedness and numbness.  Hematological: Does not bruise/bleed easily.  Psychiatric/Behavioral: Negative for agitation and behavioral problems.    Objective:  BP (!) 146/74   Pulse 75   Temp (!) 97.5 F (36.4 C) (Oral)   Ht  5' 7"  (1.702 m)   Wt 99 lb 3.2 oz (45 kg)   SpO2 97%   BMI 15.54 kg/m   BP/Weight 05/31/2017 05/27/2017 3/90/3009  Systolic BP 233 007 622  Diastolic BP 74 80 66  Wt. (Lbs) 99.2 99.4 -  BMI 15.54 15.57 -      Physical Exam  Constitutional: She is oriented to person, place, and time. She appears well-developed and well-nourished.  Cardiovascular: Normal rate, normal heart sounds and intact distal pulses.  No murmur heard. Pulmonary/Chest: Effort normal and breath sounds normal. She has no wheezes. She has no rales. She exhibits no tenderness.  Abdominal: Soft. Bowel sounds are normal. She exhibits no distension and no mass. There is no tenderness.  Musculoskeletal: Normal range of motion.  Neurological: She is alert and oriented to person, place, and time.  Skin: Skin is warm and dry.  Psychiatric: She has a normal mood and affect.     CMP Latest Ref Rng & Units 04/29/2017 10/30/2016 10/17/2016  Glucose 65 - 99 mg/dL 117(H) 103(H) 86  BUN 6 - 20 mg/dL 10 6 10   Creatinine 0.44 - 1.00 mg/dL 0.57 0.59 0.56(L)  Sodium 135 - 145 mmol/L 139 137 141  Potassium 3.5 - 5.1 mmol/L 3.5 2.9(L) 3.9  Chloride 101 - 111 mmol/L 103 105 101  CO2 22 - 32 mmol/L 22 24 21   Calcium 8.9 - 10.3 mg/dL 9.8 9.4 9.6  Total Protein 6.0 - 8.5 g/dL - - 7.0  Total Bilirubin 0.0 - 1.2 mg/dL - - 0.4  Alkaline Phos 39 - 117 IU/L - - 110  AST 0 - 40 IU/L - - 24  ALT 0 - 32 IU/L - - 23    Lipid Panel     Component Value Date/Time   CHOL 168 10/17/2016 0852   TRIG 113 10/17/2016 0852   HDL 93 10/17/2016 0852   CHOLHDL 1.8 10/17/2016 0852   CHOLHDL 1.7 12/21/2015 1230   VLDL 14 12/21/2015 1230   LDLCALC 52 10/17/2016 0852    The 10-year ASCVD risk score (Goff DC Jr., et al., 2013) is: 4.3%   Values used to calculate the score:     Age: 60 years     Sex: Female     Is Non-Hispanic African American: No     Diabetic: No     Tobacco smoker: Yes     Systolic Blood Pressure: 633 mmHg     Is BP treated:  Yes     HDL Cholesterol: 93 mg/dL     Total Cholesterol: 168 mg/dL  Assessment & Plan:   1. Essential hypertension Elevated No regimen change today Low-sodium diet, DASH diet, lifestyle modifications - metoprolol succinate (TOPROL-XL) 25 MG 24 hr tablet; Take 1 tablet (25 mg total) by mouth daily.  Dispense: 30 tablet; Refill: 6 - amLODipine (NORVASC) 10 MG tablet; Take 1 tablet (10 mg total) by mouth daily.  Dispense: 30 tablet; Refill: 6 - Lipid panel - CMP14+EGFR  2. Pure hypercholesterolemia Controlled Low-cholesterol diet - atorvastatin (LIPITOR)  20 MG tablet; Take 1 tablet (20 mg total) by mouth daily.  Dispense: 30 tablet; Refill: 6  3. Smoking Smoking cessation support: smoking cessation hotline: 1-800-QUIT-NOW.  Smoking cessation classes are available through [facility]. Call [phone] or visit our website at [email or web address]  Spent 3 minutes counseling on dangers of tobacco use and benefits of quitting and patient is working on quitting.  - buPROPion (ZYBAN) 150 MG 12 hr tablet; Take 1 tablet (150 mg total) by mouth 2 (two) times daily. For smoking cessation  Dispense: 60 tablet; Refill: 3  4. Screening for colon cancer Declines colonoscopy - Fecal occult blood, imunochemical([facility]); Future   Meds ordered this encounter  Medications  . metoprolol succinate (TOPROL-XL) 25 MG 24 hr tablet    Sig: Take 1 tablet (25 mg total) by mouth daily.    Dispense:  30 tablet    Refill:  6  . amLODipine (NORVASC) 10 MG tablet    Sig: Take 1 tablet (10 mg total) by mouth daily.    Dispense:  30 tablet    Refill:  6  . atorvastatin (LIPITOR) 20 MG tablet    Sig: Take 1 tablet (20 mg total) by mouth daily.    Dispense:  30 tablet    Refill:  6  . buPROPion (ZYBAN) 150 MG 12 hr tablet    Sig: Take 1 tablet (150 mg total) by mouth 2 (two) times daily. For smoking cessation    Dispense:  60 tablet    Refill:  3    Follow-up: Return  in about 6 months (around 11/30/2017) for Follow-up of chronic medical conditions.   Charlott Rakes MD

## 2017-06-01 LAB — CMP14+EGFR
A/G RATIO: 2.3 — AB (ref 1.2–2.2)
ALK PHOS: 93 IU/L (ref 39–117)
ALT: 28 IU/L (ref 0–32)
AST: 21 IU/L (ref 0–40)
Albumin: 4.8 g/dL (ref 3.5–5.5)
BILIRUBIN TOTAL: 0.3 mg/dL (ref 0.0–1.2)
BUN/Creatinine Ratio: 21 (ref 9–23)
BUN: 12 mg/dL (ref 6–24)
CHLORIDE: 102 mmol/L (ref 96–106)
CO2: 22 mmol/L (ref 20–29)
Calcium: 9.6 mg/dL (ref 8.7–10.2)
Creatinine, Ser: 0.56 mg/dL — ABNORMAL LOW (ref 0.57–1.00)
GFR calc Af Amer: 121 mL/min/{1.73_m2} (ref >59)
GFR calc non Af Amer: 105 mL/min/{1.73_m2} (ref >59)
GLUCOSE: 89 mg/dL (ref 65–99)
Globulin, Total: 2.1 g/dL (ref 1.5–4.5)
POTASSIUM: 4.1 mmol/L (ref 3.5–5.2)
Sodium: 140 mmol/L (ref 134–144)
Total Protein: 6.9 g/dL (ref 6.0–8.5)

## 2017-06-01 LAB — LIPID PANEL
CHOL/HDL RATIO: 1.7 ratio (ref 0.0–4.4)
Cholesterol, Total: 173 mg/dL (ref 100–199)
HDL: 102 mg/dL (ref >39)
LDL Calculated: 60 mg/dL (ref 0–99)
TRIGLYCERIDES: 56 mg/dL (ref 0–149)
VLDL CHOLESTEROL CAL: 11 mg/dL (ref 5–40)

## 2017-06-05 ENCOUNTER — Telehealth: Payer: Self-pay | Admitting: *Deleted

## 2017-06-05 NOTE — Telephone Encounter (Signed)
I spoke with pt and reviewed monitor results with her.  

## 2017-06-05 NOTE — Telephone Encounter (Signed)
-----   Message from Burnell Blanks, MD sent at 06/05/2017  2:57 PM EDT ----- She has rare PACs and rare PVCs with short 6 beat run of VT and 11 beat run of SVT. LV function normal on echo. Agree with continuing beta blocker. CDM

## 2017-06-24 ENCOUNTER — Telehealth: Payer: Self-pay | Admitting: Cardiology

## 2017-06-24 DIAGNOSIS — I10 Essential (primary) hypertension: Secondary | ICD-10-CM

## 2017-06-24 MED ORDER — METOPROLOL SUCCINATE ER 50 MG PO TB24: 50.0000 mg | ORAL_TABLET | Freq: Every day | ORAL | 6 refills | Status: DC

## 2017-06-24 MED ORDER — METOPROLOL TARTRATE 25 MG PO TABS: ORAL_TABLET | ORAL | 3 refills | Status: DC

## 2017-06-24 MED FILL — METOPROLOL SUCCINATE ER 50: 50 | 30 days supply | Qty: 30 | Fill #0

## 2017-06-24 NOTE — Telephone Encounter (Signed)
Thanks Gae Bon.   Gerald Stabs

## 2017-06-24 NOTE — Telephone Encounter (Signed)
I spoke with pt and gave her instructions from Daune Perch, NP.  Will send prescription for Toprol 50 mg to [facility].  Pt does not need prescription for lopressor 25 sent in at this time.

## 2017-06-24 NOTE — Telephone Encounter (Signed)
Have pt increase Toprol to 50 mg daily. She can also take metoprolol tartrate 25 mg daily as needed for fast heart beat. She may still have some left, but if she doesn't, we can send her in some. If she ends up using a lot of the prn metoprolol she should call us to increase her Toprol again. Please make a follow up appt with me in 2-4 weeks (her preference) to follow up on these meds.

## 2017-06-24 NOTE — Telephone Encounter (Signed)
I scheduled pt to see Daune Perch, NP on June 19,2019 at 10:30 for follow up

## 2017-06-24 NOTE — Telephone Encounter (Signed)
New Message:       Pt c/o Shortness Of Breath: STAT if SOB developed within the last 24 hours or pt is noticeably SOB on the phone  1. Are you currently SOB (can you hear that pt is SOB on the phone)? No  2. How long have you been experiencing SOB? Since yesterday  3. Are you SOB when sitting or when up moving around? both  4. Are you currently experiencing any other symptoms? Slight chest pain and irregular heartbeat

## 2017-06-24 NOTE — Telephone Encounter (Signed)
Returned call to patient who states that she had an episode yesterday morning when she was getting ready for church that she felt SOB, her legs and hands started to tingle, and she felt her heart racing. She states that she layed down and it resolved in about an hour. She states that last night she had an episode where she had chest discomfort that shot across her left chest over her breast area. She states that she rates this 7/10 and it only lasted for a few seconds. She states that after that her heart started to beat fast again and she felt a little lightheaded. Patient denies syncope or any other symptoms. Patient was recently seen by Pecolia Ades, NP on 4/26 for palpitations. Labs were normal, echo showed normal LV function with mild MR, and holter showed rare PACs, rare PVCs, 6 beat run of VT, and 11 beat run of SVT. Patient was instructed to continue metoprolol tartrate 25 mg BID. Patient recently saw PCP who switched her to metoprolol succinate 25 mg QD. Patient is not sure how high her HR gets. Patient states that her BP this weekend was 134/77. Patient states that she drinks 2 cups of coffee (1/2 decaf 1/2 caf) a day and that she occasionally will drink alcohol. Patient denies any symptoms at this time. Patient states that she felt her heart beating fast this morning. Made patient aware that I would send the information for further recommendation from the provider.

## 2017-06-24 NOTE — Addendum Note (Signed)
Addended by: Thompson Grayer on: 06/24/2017 01:43 PM   Modules accepted: Orders

## 2017-06-25 MED FILL — ATORVASTATIN 20 MG TABLET: 20 | 30 days supply | Qty: 30 | Fill #0

## 2017-06-25 MED FILL — AMLODIPINE BESYLATE 10 MG T: 10 | 30 days supply | Qty: 30 | Fill #0

## 2017-07-22 MED FILL — ?ATORVASTATIN 20 MG TABLET: 20 | 30 days supply | Qty: 30 | Fill #1

## 2017-07-22 MED FILL — AMLODIPINE BESYLATE 10 MG T: 10 | 30 days supply | Qty: 30 | Fill #1

## 2017-07-24 ENCOUNTER — Ambulatory Visit (INDEPENDENT_AMBULATORY_CARE_PROVIDER_SITE_OTHER): Payer: Self-pay | Admitting: Cardiology

## 2017-07-24 ENCOUNTER — Encounter: Payer: Self-pay | Admitting: Cardiology

## 2017-07-24 VITALS — BP 142/68 | HR 73 | Ht 67.0 in | Wt 99.0 lb

## 2017-07-24 DIAGNOSIS — I471 Supraventricular tachycardia: Secondary | ICD-10-CM

## 2017-07-24 DIAGNOSIS — F17200 Nicotine dependence, unspecified, uncomplicated: Secondary | ICD-10-CM

## 2017-07-24 DIAGNOSIS — E7800 Pure hypercholesterolemia, unspecified: Secondary | ICD-10-CM

## 2017-07-24 DIAGNOSIS — I10 Essential (primary) hypertension: Secondary | ICD-10-CM

## 2017-07-24 MED ORDER — METOPROLOL SUCCINATE ER 100 MG PO TB24: 100.0000 mg | ORAL_TABLET | Freq: Every day | ORAL | 3 refills | Status: DC

## 2017-07-24 NOTE — Progress Notes (Signed)
Cardiology Office Note:    Date:  07/24/2017   ID:  Melanie Harris, DOB [birth year], MRN [id number]  PCP:  Charlott Rakes, MD  Cardiologist:  Lauree Chandler, MD  Referring MD: Charlott Rakes, MD   Chief Complaint  Patient presents with  . Follow-up    palpitations    History of Present Illness:    Melanie Harris is a 57 y.o. female with a past medical history significant for tobacco use, hypercholesterolemia, hypertension and anxiety.  [name] was seen in the [facility] emergency department on 04/29/17 with complaints of palpitations. She was tachycardic with PACs on EKG. No source of her palpitations was found at that time and she was referred to cardiology. Thyroid function was assessed and was normal. She had essentially a normal echocardiogram except for mild mitral regurgitation. She was started on beta blocker by myself and a 48 hour Holter monitor was ordered which showed sinus rhythm with rare PACs, a short run of SVT (11 beats) and a 6 beat run of ventricular tachycardia. I advised her to increase her beta blocker and she could take an extra metoprolol tartrate as needed for fast heart rate.   [name] is here today for follow up. She is still having intermittent palpitations several times a week but not every day. It is a quick flutter, a second. She has also noted a few times that she felt that her heart was beating really fast with lightheadedness and she had to sit down. She took one dose of short acting metoprolol. This usually resolves after a couple of minutes. No chest pain or shortness of breath with the episodes. On Sunday she has a "Piercing" pain in her lower sternal area that lasted for a second. No exertional chest discomfort or shortness of breath.    Past Medical History:  Diagnosis Date  . Abscess of right thigh 07/25/2011  . Abscess of thigh    right  . Anxiety state 12/21/2015  . Cough 08/12/2013  . Hypertension   . Low grade squamous  intraepithelial lesion (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 Pathology negative 06/2016: LSIL/no hpv testing done  . Other emphysema ([place]) 08/12/2013  . Pure hypercholesterolemia 12/22/2015  . Rash 08/21/2011  . Smoking 08/12/2013  . Xerosis of skin 02/21/2017    Past Surgical History:  Procedure Laterality Date  . ABDOMINAL HYSTERECTOMY  2004  . T[age]ILLECTOMY  [age] yrs old    Current Medications: Current Meds  Medication Sig  . amLODipine (NORVASC) 10 MG tablet Take 1 tablet (10 mg total) by mouth daily.  Marland Kitchen atorvastatin (LIPITOR) 20 MG tablet Take 1 tablet (20 mg total) by mouth daily.  . carboxymethylcellulose (REFRESH PLUS) 0.5 % SOLN Place 1 drop into both eyes 3 (three) times daily as needed (dry eyes).  Marland Kitchen ibuprofen (ADVIL,MOTRIN) 200 MG tablet Take 400 mg by mouth every 6 (six) hours as needed for moderate pain.  . metoprolol succinate (TOPROL-XL) 100 MG 24 hr tablet Take 1 tablet (100 mg total) by mouth daily.  . metoprolol tartrate (LOPRESSOR) 25 MG tablet Take one tablet by mouth daily as needed for fast heart rate  . Multiple Vitamin (MULTIVITAMIN) capsule Take 1 capsule by mouth daily.  . sodium chloride (OCEAN) 0.65 % SOLN nasal spray Place 1 spray into both nostrils as needed for congestion.  . triamcinolone cream (KENALOG) 0.1 % Apply 1 application topically 2 (  two) times daily.  . Vitamin D, Ergocalciferol, 2000 units CAPS Take 2,000 Units by mouth daily.  . [DISCONTINUED] metoprolol succinate (TOPROL-XL) 50 MG 24 hr tablet Take 1 tablet (50 mg total) by mouth daily.     Allergies:   Doxycycline   Social History   Socioeconomic History  . Marital status: Divorced    Spouse name: Not on file  . Number of children: Not on file  . Years of education: Not on file  . Highest education level: Not on file  Occupational History  . Not on file  Social Needs  . Financial resource strain: Not on  file  . Food insecurity:    Worry: Not on file    Inability: Not on file  . Transportation needs:    Medical: Not on file    Non-medical: Not on file  Tobacco Use  . Smoking status: Current Every Day Smoker    Packs/day: 0.25    Years: 30.00    Pack years: 7.50    Types: Cigarettes  . Smokeless tobacco: Never Used  . Tobacco comment: 1-3 cigs daily  Substance and Sexual Activity  . Alcohol use: Yes    Alcohol/week: 0.6 oz    Types: 1 Glasses of wine per week    Comment: occassional  . Drug use: No  . Sexual activity: Not Currently  Lifestyle  . Physical activity:    Days per week: Not on file    Minutes per session: Not on file  . Stress: Not on file  Relationships  . Social connections:    Talks on phone: Not on file    Gets together: Not on file    Attends religious service: Not on file    Active member of club or organization: Not on file    Attends meetings of clubs or organizations: Not on file    Relationship status: Not on file  Other Topics Concern  . Not on file  Social History Narrative  . Not on file     Family History: The patient's family history includes Diabetes in her sister; Dysrhythmia in her sister; Healthy in her daughter; Heart failure in her mother; Hypercholesterolemia in her father; Hyperlipidemia in her mother; Hypertension in her father and mother; Lung cancer in her father. ROS:   Please see the history of present illness.     All other systems reviewed and are negative.  EKGs/Labs/Other Studies Reviewed:    The following studies were reviewed today:  48 hour Holter monitor 06/05/17 Study Highlights  Sinus rhythm Rare premature atrial contractions (70 during monitoring period) Short run of SVT (11 beats) Rare premature ventricular contractions (7 during monitoring period) 6 beat run of ventricular tachycardia.   Echocardiogram 05/31/17 Study Conclusions - Left ventricle: The cavity size was normal. Wall thickness was   normal.  Systolic function was normal. The estimated ejection   fraction was in the range of 60% to 65%. Left ventricular   diastolic function parameters were normal. - Mitral valve: There was mild regurgitation. - Atrial septum: No defect or patent foramen ovale was identified.   EKG:  EKG is not ordered today.   Recent Labs: 04/29/2017: Hemoglobin 14.2; Magnesium 2.0; Platelets 333 05/27/2017: TSH 1.060 05/31/2017: ALT 28; BUN 12; Creatinine, Ser 0.56; Potassium 4.1; Sodium 140   Recent Lipid Panel    Component Value Date/Time   CHOL 173 05/31/2017 1032   TRIG 56 05/31/2017 1032   HDL 102 05/31/2017 1032   CHOLHDL 1.7 05/31/2017  1032   CHOLHDL 1.7 12/21/2015 1230   VLDL 14 12/21/2015 1230   LDLCALC 60 05/31/2017 1032    Physical Exam:    VS:  BP (!) 142/68   Pulse 73   Ht 5\' 7"  (1.702 m)   Wt 99 lb (44.9 kg)   SpO2 98%   BMI 15.51 kg/m     Wt Readings from Last 3 Encounters:  07/24/17 99 lb (44.9 kg)  05/31/17 99 lb 3.2 oz (45 kg)  05/27/17 99 lb 6.4 oz (45.1 kg)     Physical Exam  Constitutional: She is oriented to person, place, and time. She appears well-developed.  Thin female  HENT:  Head: Normocephalic and atraumatic.  Neck: Normal range of motion. Neck supple. No JVD present.  Cardiovascular: Normal rate, regular rhythm, normal heart sounds and intact distal pulses. Exam reveals no gallop and no friction rub.  No murmur heard. Pulmonary/Chest: Effort normal and breath sounds normal.  Abdominal: Soft. Bowel sounds are normal.  Musculoskeletal: Normal range of motion. She exhibits no edema, tenderness or deformity.  Neurological: She is alert and oriented to person, place, and time.  Skin: Skin is warm and dry.  Psychiatric: She has a normal mood and affect. Her behavior is normal. Thought content normal.  Vitals reviewed.    ASSESSMENT:    1. Essential hypertension   2. SVT (supraventricular tachycardia) (HCC)   3. Pure hypercholesterolemia   4. Smoking      PLAN:    In order of problems listed above:  SVT: On Toprol XL 50 mg with metoprolol tartrate for as needed use. She continues to have some intermittent palpitations and a few episodes of fast heart beat with lightheadedness that lasted for several minutes. Will increase Toprol to 100 mg daily and refer to EP. Reviewed emergency procedures with pt.   Hypertension: on amlodipine 10 mg and metoprolol. BP is mildly elevated. Increase in Toprol as above,   Hyperlipidemia: Lipid panel on 10/17/16 showed LDL of 52 On atorvastatin 20 mg. Continue current therapy.  Tobacco abuse: She is trying to quit smoking and has quit as of 3 days ago. She had created a plan and is working on that plan.    Medication Adjustments/Labs and Tests Ordered: Current medicines are reviewed at length with the patient today.  Concerns regarding medicines are outlined above. Labs and tests ordered and medication changes are outlined in the patient instructions below:  Patient Instructions  Medication Instructions: Your physician has recommended you make the following change in your medication:  INCREASE: Metoprolol (Toprolol XL) to 100 mg daily    Labwork: None  Procedures/Testing: None  Follow-Up: You have been referred to EP for consult of SVT    Any Additional Special Instructions Will Be Listed Below (If Applicable).   Supraventricular Tachycardia, Adult Supraventricular tachycardia (SVT) is a kind of abnormal heartbeat. It makes your heart beat very fast and then beat at a normal speed. A normal heart beats 60-100 times a minute. This condition can make your heart beat more than 150 times a minute. Times of having a fast heartbeat (episodes) can be scary, but they are usually not dangerous. They can lead to problems if:  They happen often.  They last a long time.  Symptoms of this condition include:  A pounding heart.  A feeling that your heart is skipping beats  (palpitations).  Weakness.  Trouble getting enough air (shortness of breath).  Pain or tightness in your chest.  Feeling like you  are going to pass out (light-headedness).  Feeling worried or nervous (anxiety).  Dizziness.  Sweating.  Feeling sick to your stomach (nausea).  Passing out (fainting).  Tiredness.  Sometimes, there are no symptoms. Follow these instructions at home: Stress  Avoid things that make you feel stressed.  Find out what helps you feel less stressed. Try: ? Doing a relaxing activity, like yoga, meditation, or being out in nature. ? Listening to relaxing music. ? Doing relaxation techniques, like deep breathing. ? Taking steps to be healthy. These include getting lots of sleep, exercising, and eating a balanced diet. ? Talking with a mental health doctor. Sleep  Try to get at least 7 hours of sleep each night. Tobacco and nicotine  Do not use anything that has nicotine or tobacco, such as cigarettes and e-cigarettes. If you need help quitting, ask your doctor. Alcohol  If alcohol gives you a fast heartbeat, do not drink alcohol.  If alcohol does not seem to give you a fast heartbeat, limit your alcohol. For nonpregnant women, this means no more than 1 drink a day. For men, this means no more than 2 drinks a day. "One drink" means one of these: ? 12 oz of beer. ? 5 oz of wine. ? 1 oz of hard liquor. Caffeine  If caffeine gives you a fast heartbeat, do not eat, drink, or use anything with caffeine in it.  If caffeine does not seem to give you a fast heartbeat, limit how much caffeine you eat, drink, or use. Stimulant drugs  Do not use stimulant drugs. These are drugs like cocaine or methamphetamine. If you need help quitting, ask your doctor. General instructions  Stay at a healthy weight.  Exercise regularly. Ask your doctor to suggest some good activities for you. Try one of these options: ? 150 minutes a week of gentle exercise, like  walking or yoga. ? 75 minutes a week of exercise that is very active, like running or swimming. ? A combination of gentle exercise and very active exercise.  Do home treatments to slow down your heartbeat as told by your doctor.  Take over-the-counter and prescription medicines only as told by your doctor. Contact a doctor if:  You have a fast heartbeat more often.  Times of having a fast heartbeat last longer than before.  Your home treatments to slow down your heartbeat do not help.  You have new symptoms. Get help right away if:  You have chest pain.  Your symptoms get worse.  You have trouble breathing.  Your heart beats very fast for more than 20 minutes.  You pass out (faint). These symptoms may be an emergency. Do not wait to see if the symptoms will go away. Get medical help right away. Call your local emergency services (911 in the [place] Do not drive yourself to the hospital. This information is not intended to replace advice given to you by your health care provider. Make sure you discuss any questions you have with your health care provider. Document Released: 01/22/2005 Document Revised: 09/29/2015 Document Reviewed: 09/29/2015 Elsevier Interactive Patient Education  2017 [employer].   If you need a refill on your cardiac medications before your next appointment, please call your pharmacy.       Signed, Daune Perch, NP  07/24/2017 11:08 AM    [place]

## 2017-07-24 NOTE — Patient Instructions (Addendum)
Medication Instructions: Your physician has recommended you make the following change in your medication:  INCREASE: Metoprolol (Toprolol XL) to 100 mg daily    Labwork: None  Procedures/Testing: None  Follow-Up: You have been referred to EP for consult of SVT    Any Additional Special Instructions Will Be Listed Below (If Applicable).   Supraventricular Tachycardia, Adult Supraventricular tachycardia (SVT) is a kind of abnormal heartbeat. It makes your heart beat very fast and then beat at a normal speed. A normal heart beats 60-100 times a minute. This condition can make your heart beat more than 150 times a minute. Times of having a fast heartbeat (episodes) can be scary, but they are usually not dangerous. They can lead to problems if:  They happen often.  They last a long time.  Symptoms of this condition include:  A pounding heart.  A feeling that your heart is skipping beats (palpitations).  Weakness.  Trouble getting enough air (shortness of breath).  Pain or tightness in your chest.  Feeling like you are going to pass out (light-headedness).  Feeling worried or nervous (anxiety).  Dizziness.  Sweating.  Feeling sick to your stomach (nausea).  Passing out (fainting).  Tiredness.  Sometimes, there are no symptoms. Follow these instructions at home: Stress  Avoid things that make you feel stressed.  Find out what helps you feel less stressed. Try: ? Doing a relaxing activity, like yoga, meditation, or being out in nature. ? Listening to relaxing music. ? Doing relaxation techniques, like deep breathing. ? Taking steps to be healthy. These include getting lots of sleep, exercising, and eating a balanced diet. ? Talking with a mental health doctor. Sleep  Try to get at least 7 hours of sleep each night. Tobacco and nicotine  Do not use anything that has nicotine or tobacco, such as cigarettes and e-cigarettes. If you need help quitting, ask  your doctor. Alcohol  If alcohol gives you a fast heartbeat, do not drink alcohol.  If alcohol does not seem to give you a fast heartbeat, limit your alcohol. For nonpregnant women, this means no more than 1 drink a day. For men, this means no more than 2 drinks a day. "One drink" means one of these: ? 12 oz of beer. ? 5 oz of wine. ? 1 oz of hard liquor. Caffeine  If caffeine gives you a fast heartbeat, do not eat, drink, or use anything with caffeine in it.  If caffeine does not seem to give you a fast heartbeat, limit how much caffeine you eat, drink, or use. Stimulant drugs  Do not use stimulant drugs. These are drugs like cocaine or methamphetamine. If you need help quitting, ask your doctor. General instructions  Stay at a healthy weight.  Exercise regularly. Ask your doctor to suggest some good activities for you. Try one of these options: ? 150 minutes a week of gentle exercise, like walking or yoga. ? 75 minutes a week of exercise that is very active, like running or swimming. ? A combination of gentle exercise and very active exercise.  Do home treatments to slow down your heartbeat as told by your doctor.  Take over-the-counter and prescription medicines only as told by your doctor. Contact a doctor if:  You have a fast heartbeat more often.  Times of having a fast heartbeat last longer than before.  Your home treatments to slow down your heartbeat do not help.  You have new symptoms. Get help right away if:  You  have chest pain.  Your symptoms get worse.  You have trouble breathing.  Your heart beats very fast for more than 20 minutes.  You pass out (faint). These symptoms may be an emergency. Do not wait to see if the symptoms will go away. Get medical help right away. Call your local emergency services (911 in the [place] Do not drive yourself to the hospital. This information is not intended to replace advice given to you by your health care provider.  Make sure you discuss any questions you have with your health care provider. Document Released: 01/22/2005 Document Revised: 09/29/2015 Document Reviewed: 09/29/2015 Elsevier Interactive Patient Education  2017 [employer].   If you need a refill on your cardiac medications before your next appointment, please call your pharmacy.

## 2017-07-25 MED FILL — METOPROLOL SUCCINATE ER 100: 100 | 30 days supply | Qty: 30 | Fill #0

## 2017-08-02 ENCOUNTER — Encounter: Payer: Self-pay | Admitting: Internal Medicine

## 2017-08-05 ENCOUNTER — Ambulatory Visit: Payer: Medicaid Other | Attending: Family Medicine

## 2017-08-13 ENCOUNTER — Institutional Professional Consult (permissible substitution): Payer: Self-pay | Admitting: Internal Medicine

## 2017-08-20 MED FILL — METOPROLOL SUCCINATE ER 100: 100 | 30 days supply | Qty: 30 | Fill #1

## 2017-08-20 MED FILL — ?ATORVASTATIN 20 MG TABLET: 20 | 30 days supply | Qty: 30 | Fill #2

## 2017-08-20 MED FILL — AMLODIPINE BESYLATE 10 MG T: 10 | 30 days supply | Qty: 30 | Fill #2

## 2017-09-02 ENCOUNTER — Ambulatory Visit: Payer: Medicaid Other

## 2017-09-04 ENCOUNTER — Ambulatory Visit (INDEPENDENT_AMBULATORY_CARE_PROVIDER_SITE_OTHER): Payer: Self-pay | Admitting: Internal Medicine

## 2017-09-04 ENCOUNTER — Encounter: Payer: Self-pay | Admitting: Internal Medicine

## 2017-09-04 VITALS — BP 146/76 | HR 73 | Ht 67.0 in | Wt 100.6 lb

## 2017-09-04 DIAGNOSIS — I471 Supraventricular tachycardia: Secondary | ICD-10-CM

## 2017-09-04 DIAGNOSIS — R002 Palpitations: Secondary | ICD-10-CM

## 2017-09-04 NOTE — Progress Notes (Signed)
HPI Ms. Sinatra is referred today for evaluation of palpitations. She is otherwise healthy except for HTN and tobacco abuse. She has had syncope. She has noted palpitations for 6 months. She had her dose of toprol increased. She notes that since the toprol dose was raised, her symptoms have improved. She wore a cardiac monitor and this demonstrated non-sustained atrial and ventricular tachycardia. No syncope or edema.  Allergies  Allergen Reactions  . Doxycycline Rash     Current Outpatient Medications  Medication Sig Dispense Refill  . amLODipine (NORVASC) 10 MG tablet Take 1 tablet (10 mg total) by mouth daily. 30 tablet 6  . atorvastatin (LIPITOR) 20 MG tablet Take 1 tablet (20 mg total) by mouth daily. 30 tablet 6  . carboxymethylcellulose (REFRESH PLUS) 0.5 % SOLN Place 1 drop into both eyes 3 (three) times daily as needed (dry eyes).    Marland Kitchen ibuprofen (ADVIL,MOTRIN) 200 MG tablet Take 400 mg by mouth every 6 (six) hours as needed for moderate pain.    . metoprolol succinate (TOPROL-XL) 100 MG 24 hr tablet Take 1 tablet (100 mg total) by mouth daily. 90 tablet 3  . metoprolol tartrate (LOPRESSOR) 25 MG tablet Take one tablet by mouth daily as needed for fast heart rate 15 tablet 3  . Multiple Vitamin (MULTIVITAMIN) capsule Take 1 capsule by mouth daily.    . sodium chloride (OCEAN) 0.65 % SOLN nasal spray Place 1 spray into both nostrils as needed for congestion.    . triamcinolone cream (KENALOG) 0.1 % Apply 1 application topically 2 (two) times daily. 80 g 1  . Vitamin D, Ergocalciferol, 2000 units CAPS Take 2,000 Units by mouth daily.     No current facility-administered medications for this visit.      Past Medical History:  Diagnosis Date  . Abscess of right thigh 07/25/2011  . Abscess of thigh    right  . Anxiety state 12/21/2015  . Cough 08/12/2013  . Hypertension   . Low grade squamous intraepithelial lesion (LGSIL) on cervical Pap smear 07/03/2016   [ ]  rpt  cytology and HPV testing 06/2017  2002: TAH for benign indications. Pathology negative 06/2016: LSIL/no hpv testing done  . Other emphysema ([place]) 08/12/2013  . Pure hypercholesterolemia 12/22/2015  . Rash 08/21/2011  . Smoking 08/12/2013  . Xerosis of skin 02/21/2017    ROS:   All systems reviewed and negative except as noted in the HPI.   Past Surgical History:  Procedure Laterality Date  . ABDOMINAL HYSTERECTOMY  2004  . TONSILLECTOMY  10-12 yrs old     Family History  Problem Relation Age of Onset  . Heart failure Mother        started in her 88's  . Hypertension Mother   . Hyperlipidemia Mother   . Lung cancer Father   . Hypertension Father   . Hypercholesterolemia Father   . Diabetes Sister        diet controlled  . Dysrhythmia Sister        possible in the past  . Healthy Daughter      Social History   Socioeconomic History  . Marital status: Divorced    Spouse name: Not on file  . Number of children: Not on file  . Years of education: Not on file  . Highest education level: Not on file  Occupational History  . Not on file  Social Needs  . Financial resource strain: Not on file  . Food insecurity:  Worry: Not on file    Inability: Not on file  . Transportation needs:    Medical: Not on file    Non-medical: Not on file  Tobacco Use  . Smoking status: Current Every Day Smoker    Packs/day: 0.25    Years: 30.00    Pack years: 7.50    Types: Cigarettes  . Smokeless tobacco: Never Used  . Tobacco comment: 1-3 cigs daily  Substance and Sexual Activity  . Alcohol use: Yes    Alcohol/week: 0.6 oz    Types: 1 Glasses of wine per week    Comment: occassional  . Drug use: No  . Sexual activity: Not Currently  Lifestyle  . Physical activity:    Days per week: Not on file    Minutes per session: Not on file  . Stress: Not on file  Relationships  . Social connections:    Talks on phone: Not on file    Gets together: Not on file    Attends religious  service: Not on file    Active member of club or organization: Not on file    Attends meetings of clubs or organizations: Not on file    Relationship status: Not on file  . Intimate partner violence:    Fear of current or ex partner: Not on file    Emotionally abused: Not on file    Physically abused: Not on file    Forced sexual activity: Not on file  Other Topics Concern  . Not on file  Social History Narrative  . Not on file     BP (!) 146/76   Pulse 73   Ht 5\' 7"  (1.702 m)   Wt 100 lb 9.6 oz (45.6 kg)   SpO2 96%   BMI 15.76 kg/m   Physical Exam:  Well appearing 57 yo woman, NAD HEENT: Unremarkable Neck:  6 cmJVD, no thyromegally Lymphatics:  No adenopathy Back:  No CVA tenderness Lungs:  Clear with no wheezes HEART:  Regular rate rhythm, no murmurs, no rubs, no clicks Abd:  soft, positive bowel sounds, no organomegally, no rebound, no guarding Ext:  2 plus pulses, no edema, no cyanosis, no clubbing Skin:  No rashes no nodules Neuro:  CN II through XII intact, motor grossly intact  EKG - nsr with no pre-excitation  Assess/Plan: 1. Palpitations - I have reviewed the benign nature of her symptoms which are much improved. She will continue her beta blocker. If she has break through symptoms we could consider flecainide.  2. NS SVT and NSVT - when she wore her monitor, she had only brief episodes of this. We will follow. Her EF is normal. 3. HTN - her blood pressure is well controlled. We will follow.  Mikle Bosworth.D.

## 2017-09-04 NOTE — Patient Instructions (Addendum)
Medication Instructions:  Your physician recommends that you continue on your current medications as directed. Please refer to the Current Medication list given to you today.  Labwork: None ordered.  Testing/Procedures: None ordered.  Follow-Up: Your physician wants you to follow-up in: as needed with Dr. Taylor.      Any Other Special Instructions Will Be Listed Below (If Applicable).  If you need a refill on your cardiac medications before your next appointment, please call your pharmacy.   

## 2017-09-18 ENCOUNTER — Other Ambulatory Visit: Payer: Self-pay | Admitting: Obstetrics and Gynecology

## 2017-09-18 DIAGNOSIS — Z1231 Encounter for screening mammogram for malignant neoplasm of breast: Secondary | ICD-10-CM

## 2017-09-18 MED FILL — METOPROLOL SUCCINATE ER 100: 100 | 30 days supply | Qty: 30 | Fill #2

## 2017-09-18 MED FILL — ?ATORVASTATIN 20 MG TABLET: 20 | 30 days supply | Qty: 30 | Fill #3

## 2017-09-18 MED FILL — AMLODIPINE BESYLATE 10 MG T: 10 | 30 days supply | Qty: 30 | Fill #3

## 2017-10-02 ENCOUNTER — Other Ambulatory Visit: Payer: Self-pay | Admitting: Pharmacist

## 2017-10-02 MED ORDER — TRIAMCINOLONE ACETONIDE 0.1 % EX CREA: 1.0000 "application " | TOPICAL_CREAM | Freq: Two times a day (BID) | CUTANEOUS | 0 refills | Status: DC

## 2017-10-02 MED FILL — ?TRIDERM 0.1% CREAM: 0.1 | 30 days supply | Qty: 3 | Fill #0

## 2017-10-18 MED FILL — AMLODIPINE BESYLATE 10 MG T: 10 | 30 days supply | Qty: 30 | Fill #4

## 2017-10-18 MED FILL — METOPROLOL SUCCINATE ER 100: 100 | 30 days supply | Qty: 30 | Fill #3

## 2017-10-18 MED FILL — ?ATORVASTATIN 20 MG TABLET: 20 | 30 days supply | Qty: 30 | Fill #4

## 2017-11-18 MED FILL — AMLODIPINE BESYLATE 10 MG T: 10 | 30 days supply | Qty: 30 | Fill #5

## 2017-11-18 MED FILL — ?ATORVASTATIN 20 MG TABLET: 20 | 30 days supply | Qty: 30 | Fill #5

## 2017-11-18 MED FILL — METOPROLOL SUCCINATE ER 100: 100 | 30 days supply | Qty: 30 | Fill #4

## 2017-11-28 ENCOUNTER — Ambulatory Visit (HOSPITAL_COMMUNITY): Payer: Self-pay

## 2017-12-18 MED FILL — ?ATORVASTATIN 20 MG TABLET: 20 | 30 days supply | Qty: 30 | Fill #6

## 2017-12-18 MED FILL — METOPROLOL SUCCINATE ER 100: 100 | 30 days supply | Qty: 30 | Fill #5

## 2017-12-18 MED FILL — AMLODIPINE BESYLATE 10 MG T: 10 | 30 days supply | Qty: 30 | Fill #6

## 2018-01-14 ENCOUNTER — Other Ambulatory Visit: Payer: Self-pay | Admitting: Family Medicine

## 2018-01-14 DIAGNOSIS — I10 Essential (primary) hypertension: Secondary | ICD-10-CM

## 2018-01-14 DIAGNOSIS — E7800 Pure hypercholesterolemia, unspecified: Secondary | ICD-10-CM

## 2018-01-14 MED FILL — METOPROLOL SUCCINATE ER 100: 100 | 30 days supply | Qty: 30 | Fill #6

## 2018-01-15 MED FILL — ?ATORVASTATIN 20 MG TABLET: 20 | 30 days supply | Qty: 30 | Fill #0

## 2018-01-15 MED FILL — AMLODIPINE BESYLATE 10 MG T: 10 | 30 days supply | Qty: 30 | Fill #0

## 2018-02-12 ENCOUNTER — Ambulatory Visit: Payer: Self-pay | Attending: Family Medicine

## 2018-02-13 ENCOUNTER — Ambulatory Visit: Payer: Self-pay | Attending: Family Medicine | Admitting: Family Medicine

## 2018-02-13 ENCOUNTER — Encounter: Payer: Self-pay | Admitting: Family Medicine

## 2018-02-13 VITALS — BP 146/71 | HR 67 | Temp 97.7°F | Resp 18 | Ht 67.0 in | Wt 103.0 lb

## 2018-02-13 DIAGNOSIS — E7800 Pure hypercholesterolemia, unspecified: Secondary | ICD-10-CM

## 2018-02-13 DIAGNOSIS — E042 Nontoxic multinodular goiter: Secondary | ICD-10-CM

## 2018-02-13 DIAGNOSIS — I10 Essential (primary) hypertension: Secondary | ICD-10-CM

## 2018-02-13 DIAGNOSIS — Z79899 Other long term (current) drug therapy: Secondary | ICD-10-CM

## 2018-02-13 DIAGNOSIS — F17210 Nicotine dependence, cigarettes, uncomplicated: Secondary | ICD-10-CM

## 2018-02-13 MED ORDER — METOPROLOL SUCCINATE ER 100 MG PO TB24: 100.0000 mg | ORAL_TABLET | Freq: Every day | ORAL | 4 refills | Status: DC

## 2018-02-13 MED ORDER — AMLODIPINE BESYLATE 10 MG PO TABS: 10.0000 mg | ORAL_TABLET | Freq: Every day | ORAL | 4 refills | Status: DC

## 2018-02-13 MED ORDER — ATORVASTATIN CALCIUM 20 MG PO TABS: 20.0000 mg | ORAL_TABLET | Freq: Every day | ORAL | 4 refills | Status: DC

## 2018-02-13 MED FILL — ?ATORVASTATIN 20 MG TABLET: 20 | 30 days supply | Qty: 30 | Fill #0

## 2018-02-13 MED FILL — AMLODIPINE BESYLATE 10 MG T: 10 | 30 days supply | Qty: 30 | Fill #0

## 2018-02-13 MED FILL — METOPROLOL SUCCINATE ER 100: 100 | 30 days supply | Qty: 30 | Fill #0

## 2018-02-13 NOTE — Progress Notes (Signed)
146  

## 2018-02-13 NOTE — Progress Notes (Signed)
Subjective:    Patient ID: Melanie Harris, female    DOB: 1960/10/16, 58 y.o.   MRN: [id number]  HPI       58 yo female who is seen in follow-up of hypertension, history of SVT and hyperlipidemia.  Patient additionally with history of multiple thyroid nodules for  which patient reports she has had previous ultrasound and biopsy.  Patient reports that she has been taking her blood pressure and cholesterol medication on a daily basis.  Patient denies any headaches or dizziness related to her blood pressure.  Patient has had no symptoms of palpitations, shortness of breath associated with SVT.  Patient is taking the metoprolol daily.  Patient denies any increased muscle or joint pain with the use of Lipitor.  Patient does continue to smoke.  Patient states that she has not eaten today but had a couple coffee at about 8:30AM.  Patient is okay with having blood work done at today's visit.  Patient denies any problems or concerns. Past Medical History:  Diagnosis Date  . Abscess of right thigh 07/25/2011  . Abscess of thigh    right  . Anxiety state 12/21/2015  . Cough 08/12/2013  . Hypertension   . Low grade squamous intraepithelial lesion (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 Pathology negative 06/2016: LSIL/no hpv testing done  . Other emphysema ([place]) 08/12/2013  . Pure hypercholesterolemia 12/22/2015  . Rash 08/21/2011  . Smoking 08/12/2013  . Xerosis of skin 02/21/2017   Past Surgical History:  Procedure Laterality Date  . ABDOMINAL HYSTERECTOMY  2004  . TONSILLECTOMY  [age] yrs old   Family History  Problem Relation Age of Onset  . Heart failure Mother        started in her 6's  . Hypertension Mother   . Hyperlipidemia Mother   . Lung cancer Father   . Hypertension Father   . Hypercholesterolemia Father   . Diabetes Sister        diet controlled  . Dysrhythmia Sister        possible in the past  .  Healthy Daughter    Social History   Tobacco Use  . Smoking status: Current Every Day Smoker    Packs/day: 0.25    Years: 30.00    Pack years: 7.50    Types: Cigarettes  . Smokeless tobacco: Never Used  . Tobacco comment: 1-3 cigs daily  Substance Use Topics  . Alcohol use: Yes    Alcohol/week: 1.0 standard drinks    Types: 1 Glasses of wine per week    Comment: occassional  . Drug use: No   Allergies  Allergen Reactions  . Doxycycline Rash      Review of Systems  Constitutional: Negative for chills, fatigue and fever.  HENT: Negative for sore throat and trouble swallowing.   Respiratory: Negative for cough and shortness of breath.   Cardiovascular: Negative for chest pain, palpitations and leg swelling.  Gastrointestinal: Negative for abdominal pain, constipation, diarrhea and nausea.  Endocrine: Negative for polydipsia, polyphagia and polyuria.  Genitourinary: Negative for dysuria and frequency.  Musculoskeletal: Negative for arthralgias, back pain and gait problem.  Neurological: Negative for dizziness and headaches.  Hematological: Negative for adenopathy. Does not bruise/bleed easily.       Objective:   Physical Exam BP (!) 146/71 (BP Location: Left Arm, Patient Position: Sitting, Cuff Size: Normal)   Pulse 67  Temp 97.7 F (36.5 C) (Oral)   Resp 18   Ht 5\' 7"  (1.702 m)   Wt 103 lb (46.7 kg)   SpO2 97%   BMI 16.13 kg/m Nurse's notes and vital signs reviewed General- thin appearing older female no acute distress Neck-supple, patient with palpable enlargement of the right side of the thyroid with palpable nodule in the inferior portion, no lymphadenopathy, no carotid bruit Lungs-clear to auscultation bilaterally Cardiovascular-regular rate and rhythm Abdomen-soft, nontender Back-no CVA tenderness Extremities-no edema      Assessment & Plan:  1. Essential hypertension Patient's blood pressure is slightly elevated at today's visit.  Patient should  follow low-sodium diet.  Prescription provided for refill of metoprolol for SVT and hypertension along with refill of amlodipine and patient will have lipid panel at today's visit.  Patient will have CMP in follow-up of long-term use of medication - metoprolol succinate (TOPROL-XL) 100 MG 24 hr tablet; Take 1 tablet (100 mg total) by mouth daily.  Dispense: 30 tablet; Refill: 4 - amLODipine (NORVASC) 10 MG tablet; Take 1 tablet (10 mg total) by mouth daily.  Dispense: 30 tablet; Refill: 4 - Comprehensive metabolic panel - Lipid panel  2. Pure hypercholesterolemia Patient with history of hyperlipidemia for which she is currently on Lipitor.  Patient will have lipid panel done at today's visit along with CMP and lipid panel.  Continue a low-fat diet. - atorvastatin (LIPITOR) 20 MG tablet; Take 1 tablet (20 mg total) by mouth daily.  Dispense: 30 tablet; Refill: 4 - Comprehensive metabolic panel - Lipid panel  3. Multinodular thyroid On review of chart, patient has had prior ultrasound for multinodular goiter and has had biopsy in the past.  Patient likely needs to be scheduled for new thyroid ultrasound for surveillance of nodules and I will leave this until she sees her PCP at her next visit in follow-up.  Patient will have TSH at today's visit. - TSH  4. Encounter for long-term (current) use of medications Patient will have CMP at today's visit in follow-up of long-term use of medications - Comprehensive metabolic panel  An After Visit Summary was printed and given to the patient.  Return in about 5 months (around 07/15/2018) for HTN/thyroid with PCP.

## 2018-02-14 LAB — COMPREHENSIVE METABOLIC PANEL WITH GFR
ALT: 44 IU/L — ABNORMAL HIGH (ref 0–32)
AST: 44 IU/L — ABNORMAL HIGH (ref 0–40)
Albumin/Globulin Ratio: 2.2 (ref 1.2–2.2)
Albumin: 4.9 g/dL (ref 3.5–5.5)
Alkaline Phosphatase: 93 IU/L (ref 39–117)
BUN/Creatinine Ratio: 20 (ref 9–23)
BUN: 14 mg/dL (ref 6–24)
Bilirubin Total: 0.6 mg/dL (ref 0.0–1.2)
CO2: 22 mmol/L (ref 20–29)
Calcium: 9.9 mg/dL (ref 8.7–10.2)
Chloride: 99 mmol/L (ref 96–106)
Creatinine, Ser: 0.69 mg/dL (ref 0.57–1.00)
GFR calc Af Amer: 113 mL/min/1.73
GFR calc non Af Amer: 98 mL/min/1.73
Globulin, Total: 2.2 g/dL (ref 1.5–4.5)
Glucose: 77 mg/dL (ref 65–99)
Potassium: 3.9 mmol/L (ref 3.5–5.2)
Sodium: 140 mmol/L (ref 134–144)
Total Protein: 7.1 g/dL (ref 6.0–8.5)

## 2018-02-14 LAB — LIPID PANEL
Chol/HDL Ratio: 1.7 ratio (ref 0.0–4.4)
Cholesterol, Total: 180 mg/dL (ref 100–199)
HDL: 107 mg/dL
LDL Calculated: 55 mg/dL (ref 0–99)
Triglycerides: 89 mg/dL (ref 0–149)
VLDL Cholesterol Cal: 18 mg/dL (ref 5–40)

## 2018-02-14 LAB — TSH: TSH: 0.899 u[IU]/mL (ref 0.450–4.500)

## 2018-02-19 ENCOUNTER — Telehealth: Payer: Self-pay | Admitting: *Deleted

## 2018-02-19 NOTE — Telephone Encounter (Signed)
-----   Message from Antony Blackbird, MD sent at 02/15/2018  7:24 PM EST ----- Notify patient of normal CMET with the exception of a mild increase in AST/ALT at 44. Lipid panel and TSH were normal

## 2018-02-19 NOTE — Telephone Encounter (Signed)
Patient verified DOB Patient is aware of labs being normal except for liver enzyme being slightly elevated and to avoid alcohol. TSH and Lipid are normal. No further questions.

## 2018-02-20 ENCOUNTER — Encounter (HOSPITAL_COMMUNITY): Payer: Self-pay | Admitting: *Deleted

## 2018-02-20 ENCOUNTER — Ambulatory Visit (HOSPITAL_COMMUNITY)
Admission: RE | Admit: 2018-02-20 | Discharge: 2018-02-20 | Disposition: A | Discharge status: Home / Self Care | Payer: Self-pay | Source: Ambulatory Visit | Attending: Obstetrics and Gynecology | Admitting: Obstetrics and Gynecology

## 2018-02-20 ENCOUNTER — Ambulatory Visit
Admission: RE | Admit: 2018-02-20 | Discharge: 2018-02-20 | Disposition: A | Discharge status: Home / Self Care | Payer: No Typology Code available for payment source | Source: Ambulatory Visit | Attending: Obstetrics and Gynecology | Admitting: Obstetrics and Gynecology

## 2018-02-20 ENCOUNTER — Encounter (HOSPITAL_COMMUNITY): Payer: Self-pay

## 2018-02-20 VITALS — BP 142/84 | Wt 101.0 lb

## 2018-02-20 DIAGNOSIS — Z1231 Encounter for screening mammogram for malignant neoplasm of breast: Secondary | ICD-10-CM

## 2018-02-20 DIAGNOSIS — Z01419 Encounter for gynecological examination (general) (routine) without abnormal findings: Secondary | ICD-10-CM

## 2018-02-20 NOTE — Patient Instructions (Signed)
Explained breast self awareness with [name]. Let patient know that follow-up for today's Pap smear will be based on the result. Referred patient to the [facility] for a screening mammogram. Appointment scheduled for Thursday, February 20, 2018 at 1540. Patient aware of appointment and will be there. Let patient know that the [facility] will follow up with her within the next couple weeks with results of mammogram by letter or phone. [name] verbalized understanding.  Serenitee Fuertes, Arvil Chaco, RN 2:32 PM

## 2018-02-20 NOTE — Progress Notes (Signed)
No complaints today.   Pap Smear: Pap smear completed today per recommendation. Last Pap smear was 06/22/2016 at [facility] and LSIL. Pap smear and co-testing recommended in one year. Per patient has no history of an abnormal Pap smear prior to the last Pap smear. Patient has a history of a hysterectomy 11/28/2000 due to fibroids. Last Pap smear and hysterectomy results are in Epic.  Physical exam: Breasts Breasts symmetrical. No skin abnormalities bilateral breasts. No nipple retraction bilateral breasts. No nipple discharge bilateral breasts. No lymphadenopathy. No lumps palpated bilateral breasts. No complaints of pain or tenderness on exam. Referred patient to the [facility] for a screening mammogram. Appointment scheduled for Thursday, February 20, 2018 at 1540.        Pelvic/Bimanual   Ext Genitalia No lesions, no swelling and no discharge observed on external genitalia.         Vagina Vagina pink and normal texture. No lesions or discharge observed in vagina.          Cervix Cervix is absent due to history of hysterectomy for benign reasons.    Uterus Uterus is absent due to history of hysterectomy for benign reasons.       Adnexae Bilateral ovaries present and palpable. No tenderness on palpation.         Rectovaginal No rectal exam completed today since patient had no rectal complaints. No skin abnormalities observed on exam.    Smoking History: Patient is a current smoker. Discussed smoking cessation with patient. Referred to the [facility] Quitline and gave resources to the free smoking cessation classes at [facility].  Patient Navigation: Patient education provided. Access to services provided for patient through [place] program.   Colorectal Cancer Screening: Per patient has never had a colonoscopy completed. No complaints today. FIT Test given to patient to complete and return to [facility].  Breast and Cervical Cancer Risk  Assessment: Patient has no family history of breast cancer, known genetic mutations, or radiation treatment to the chest before age 37. Patient has no history of cervical dysplasia, immunocompromised, or DES exposure in-utero.  Risk Assessment    Risk Scores      02/20/2018   Last edited by: Armond Hang, LPN   5-year risk: 1.1 %   Lifetime risk: 7.2 %

## 2018-02-24 ENCOUNTER — Other Ambulatory Visit: Payer: Self-pay | Admitting: Obstetrics and Gynecology

## 2018-02-24 DIAGNOSIS — R928 Other abnormal and inconclusive findings on diagnostic imaging of breast: Secondary | ICD-10-CM

## 2018-02-25 LAB — CYTOLOGY - PAP: HPV: DETECTED — AB

## 2018-02-26 ENCOUNTER — Ambulatory Visit
Admission: RE | Admit: 2018-02-26 | Discharge: 2018-02-26 | Disposition: A | Discharge status: Home / Self Care | Payer: No Typology Code available for payment source | Source: Ambulatory Visit | Attending: Obstetrics and Gynecology | Admitting: Obstetrics and Gynecology

## 2018-02-26 DIAGNOSIS — R928 Other abnormal and inconclusive findings on diagnostic imaging of breast: Secondary | ICD-10-CM

## 2018-02-27 ENCOUNTER — Ambulatory Visit (HOSPITAL_COMMUNITY): Payer: Self-pay

## 2018-03-06 ENCOUNTER — Ambulatory Visit (HOSPITAL_COMMUNITY): Payer: Self-pay

## 2018-03-17 MED FILL — AMLODIPINE BESYLATE 10 MG T: 10 | 30 days supply | Qty: 30 | Fill #1

## 2018-03-17 MED FILL — METOPROLOL SUCCINATE ER 100: 100 | 30 days supply | Qty: 30 | Fill #1

## 2018-03-17 MED FILL — ?ATORVASTATIN 20 MG TABLET: 20 | 30 days supply | Qty: 30 | Fill #1

## 2018-04-01 ENCOUNTER — Telehealth (HOSPITAL_COMMUNITY): Payer: Self-pay | Admitting: *Deleted

## 2018-04-01 NOTE — Telephone Encounter (Signed)
Telephoned patient at home number and advised patient of abnormal pap smear results +HPV. Advised patient was scheduled for colposcopy at [facility] Thursday March 19 8:55 am. Answered all questions from patient. Patient voiced understanding.

## 2018-04-14 MED FILL — ?ATORVASTATIN 20 MG TABLET: 20 | 30 days supply | Qty: 30 | Fill #2

## 2018-04-14 MED FILL — AMLODIPINE BESYLATE 10 MG T: 10 | 30 days supply | Qty: 30 | Fill #2

## 2018-04-14 MED FILL — METOPROLOL SUCCINATE ER 100: 100 | 30 days supply | Qty: 30 | Fill #2

## 2018-04-23 ENCOUNTER — Telehealth: Payer: Self-pay | Admitting: *Deleted

## 2018-04-23 NOTE — Telephone Encounter (Signed)
Called pt to notify of change in check in process. Pt denies any s/s of covid-19, travel or exposure to covid-19.  Pt verbalized understanding and stated she will attend her appointment.

## 2018-04-24 ENCOUNTER — Ambulatory Visit (INDEPENDENT_AMBULATORY_CARE_PROVIDER_SITE_OTHER): Payer: Self-pay | Admitting: Obstetrics and Gynecology

## 2018-04-24 ENCOUNTER — Other Ambulatory Visit: Payer: Self-pay

## 2018-04-24 DIAGNOSIS — R87612 Low grade squamous intraepithelial lesion on cytologic smear of cervix (LGSIL): Secondary | ICD-10-CM

## 2018-04-24 NOTE — Procedures (Signed)
Colposcopy Procedure Note  Pre-operative Diagnosis: Jan 2020 LSIL/HPV+ 06/2016: LSIL/no hpv testing done-->recommend repeat pap with cotesting one year (no colpo done) 2002: TAH for benign indications. Pathology negative  Post-operative Diagnosis: Negative  Indications:   Procedure Details  No vaginal bleeding, spotting and pt isn't sexually active.  patient endorses any tobacco use or 2nd hand smoke exposure.   The risks (including infection, bleeding, pain) and benefits of the procedure were explained to the patient and written informed consent was obtained.  The patient was placed in the dorsal lithotomy position. A Pederson was speculum inserted in the vagina, and the cuff visualized. AA staining done with green filter and then Lugol's with green filter.  Biopsy not done. No bleeding after procedure  Findings: EGBUS normal with moderate atrophy. Normal vagina with moderate atrophy and normal cuff with moderate atrophy. Cuff/vaginal apex normal with above staining and viewing and nttp.   Adequate: n/a  Specimens: none  Condition: Stable  Complications: None  Plan: I told her I recommend repeat pap next year-->please do a pap with HPV testing and genotyping with next pap smear  The patient was advised to call for any fever or for prolonged or severe pain or bleeding. She was advised to use OTC analgesics as needed for mild to moderate pain. She was advised to avoid vaginal intercourse for 48 hours or until the bleeding has completely stopped.   Durene Romans MD Attending Center for [employer])

## 2018-05-02 ENCOUNTER — Encounter: Payer: Self-pay | Admitting: *Deleted

## 2018-05-13 MED FILL — ?AMLODIPINE BESYLATE 10 MG: 10 | 60 days supply | Qty: 60 | Fill #3

## 2018-05-13 MED FILL — ?ATORVASTATIN 20 MG TABLET: 20 | 60 days supply | Qty: 60 | Fill #3

## 2018-05-13 MED FILL — ?METOPROLOL SUCC ER 100MG T: 100 | 60 days supply | Qty: 60 | Fill #3

## 2018-07-15 ENCOUNTER — Other Ambulatory Visit: Payer: Self-pay

## 2018-07-15 ENCOUNTER — Ambulatory Visit: Payer: Self-pay | Attending: Family Medicine | Admitting: Family Medicine

## 2018-07-15 ENCOUNTER — Encounter: Payer: Self-pay | Admitting: Family Medicine

## 2018-07-15 DIAGNOSIS — I10 Essential (primary) hypertension: Secondary | ICD-10-CM

## 2018-07-15 DIAGNOSIS — Z1211 Encounter for screening for malignant neoplasm of colon: Secondary | ICD-10-CM

## 2018-07-15 DIAGNOSIS — E7800 Pure hypercholesterolemia, unspecified: Secondary | ICD-10-CM

## 2018-07-15 DIAGNOSIS — N890 Mild vaginal dysplasia: Secondary | ICD-10-CM

## 2018-07-15 MED ORDER — ATORVASTATIN CALCIUM 20 MG PO TABS: 20.0000 mg | ORAL_TABLET | Freq: Every day | ORAL | 4 refills | Status: DC

## 2018-07-15 MED ORDER — AMLODIPINE BESYLATE 10 MG PO TABS: 10.0000 mg | ORAL_TABLET | Freq: Every day | ORAL | 4 refills | Status: DC

## 2018-07-15 MED ORDER — METOPROLOL SUCCINATE ER 100 MG PO TB24: 100.0000 mg | ORAL_TABLET | Freq: Every day | ORAL | 4 refills | Status: DC

## 2018-07-15 MED FILL — ?AMLODIPINE BESYLATE 10 MG: 10 | 30 days supply | Qty: 30 | Fill #0

## 2018-07-15 MED FILL — ?ATORVASTATIN 20 MG TABLET: 20 | 30 days supply | Qty: 30 | Fill #0

## 2018-07-15 MED FILL — ?METOPROLOL SUCC ER 100MG T: 100 | 30 days supply | Qty: 30 | Fill #0

## 2018-07-15 NOTE — Progress Notes (Signed)
Patient has been called and DOB has been verified. Patient has been screened and transferred to PCP to start phone visit.     

## 2018-07-15 NOTE — Progress Notes (Signed)
Virtual Visit via Telephone Note  I connected with Jackquline Berlin, on 07/15/2018 at 1:39 PM by telephone due to the COVID-19 pandemic and verified that I am speaking with the correct person using two identifiers.   Consent: I discussed the limitations, risks, security and privacy concerns of performing an evaluation and management service by telephone and the availability of in person appointments. I also discussed with the patient that there may be a patient responsible charge related to this service. The patient expressed understanding and agreed to proceed.   Location of Patient: Home  Location of Provider: Clinic   Persons participating in Telemedicine visit: Arianni Gallego Farrington-CMA Dr. Felecia Shelling     History of Present Illness: Melanie Harris is a 58 year old female with hypertension, hyperlipidemia, previous tobacco abuse who presents today for follow-up visit. Endorses compliance with her antihypertensive and her statin and denies adverse effects. She quit smoking 2 months ago and has not picked a cigarette since then.  Last seen by GYN in 04/2018 for follow-up of abnormal Pap smear.  In 02/20/2026  cytology revealed: Component 61mo ago  Adequacy Satisfactory for evaluation.Abnormal    Diagnosis LOW GRADE SQUAMOUS INTRAEPITHELIAL LESION: VAIN-1/ HPV (LSIL).Abnormal    HPV DETECTEDAbnormal    Comment: Normal Reference Range - NOT Detected  Material Submitted Vaginal Pap [ThinPrep Imaged]Abnormal          Past Medical History:  Diagnosis Date  . Abscess of right thigh 07/25/2011  . Abscess of thigh    right  . Anxiety state 12/21/2015  . Cough 08/12/2013  . Hypertension   . Low grade squamous intraepithelial lesion (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 Pathology negative 06/2016: LSIL/no hpv testing done  . Other emphysema ([place]) 08/12/2013  . Pure  hypercholesterolemia 12/22/2015  . Rash 08/21/2011  . Smoking 08/12/2013  . Xerosis of skin 02/21/2017   Allergies  Allergen Reactions  . Doxycycline Rash    Current Outpatient Medications on File Prior to Visit  Medication Sig Dispense Refill  . amLODipine (NORVASC) 10 MG tablet Take 1 tablet (10 mg total) by mouth daily. 30 tablet 4  . atorvastatin (LIPITOR) 20 MG tablet Take 1 tablet (20 mg total) by mouth daily. 30 tablet 4  . carboxymethylcellulose (REFRESH PLUS) 0.5 % SOLN Place 1 drop into both eyes 3 (three) times daily as needed (dry eyes).    Marland Kitchen ibuprofen (ADVIL,MOTRIN) 200 MG tablet Take 400 mg by mouth every 6 (six) hours as needed for moderate pain.    . metoprolol succinate (TOPROL-XL) 100 MG 24 hr tablet Take 1 tablet (100 mg total) by mouth daily. 30 tablet 4  . Multiple Vitamin (MULTIVITAMIN) capsule Take 1 capsule by mouth daily.    . sodium chloride (OCEAN) 0.65 % SOLN nasal spray Place 1 spray into both nostrils as needed for congestion.    . Vitamin D, Ergocalciferol, 2000 units CAPS Take 2,000 Units by mouth daily.    Marland Kitchen triamcinolone cream (KENALOG) 0.1 % Apply 1 application topically 2 (two) times daily. (Patient not taking: Reported on 04/24/2018) 85.2 g 0   No current facility-administered medications on file prior to visit.     Observations/Objective: Weak, alert, oriented x3 Not in acute distress  CMP Latest Ref Rng & Units 02/13/2018 05/31/2017 04/29/2017  Glucose 65 - 99 mg/dL 77 89 117(H)  BUN 6 - 24 mg/dL 14 12 10   Creatinine 0.57 -  1.00 mg/dL 0.69 0.56(L) 0.57  Sodium 134 - 144 mmol/L 140 140 139  Potassium 3.5 - 5.2 mmol/L 3.9 4.1 3.5  Chloride 96 - 106 mmol/L 99 102 103  CO2 20 - 29 mmol/L 22 22 22   Calcium 8.7 - 10.2 mg/dL 9.9 9.6 9.8  Total Protein 6.0 - 8.5 g/dL 7.1 6.9 -  Total Bilirubin 0.0 - 1.2 mg/dL 0.6 0.3 -  Alkaline Phos 39 - 117 IU/L 93 93 -  AST 0 - 40 IU/L 44(H) 21 -  ALT 0 - 32 IU/L 44(H) 28 -    Lipid Panel     Component Value  Date/Time   CHOL 180 02/13/2018 1535   TRIG 89 02/13/2018 1535   HDL 107 02/13/2018 1535   CHOLHDL 1.7 02/13/2018 1535   CHOLHDL 1.7 12/21/2015 1230   VLDL 14 12/21/2015 1230   LDLCALC 55 02/13/2018 1535    Assessment and Plan: 1. Essential hypertension Stable Counseled on blood pressure goal of less than 130/80, low-sodium, DASH diet, medication compliance, 150 minutes of moderate intensity exercise per week. Discussed medication compliance, adverse effects. - metoprolol succinate (TOPROL-XL) 100 MG 24 hr tablet; Take 1 tablet (100 mg total) by mouth daily.  Dispense: 30 tablet; Refill: 4 - amLODipine (NORVASC) 10 MG tablet; Take 1 tablet (10 mg total) by mouth daily.  Dispense: 30 tablet; Refill: 4  2. Pure hypercholesterolemia Controlled Low-cholesterol diet - atorvastatin (LIPITOR) 20 MG tablet; Take 1 tablet (20 mg total) by mouth daily.  Dispense: 30 tablet; Refill: 4  3. Screening for colon cancer Advised to call the clinic to ensure she has the [facility] financial discount to facilitate referral for colonoscopy - Ambulatory referral to Gastroenterology  4.  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 The patient was provided an opportunity to ask questions and all were answered. The patient agreed with the plan and demonstrated an understanding of the instructions.   The patient was advised to call back or seek an in-person evaluation if the symptoms worsen or if the condition fails to improve as anticipated.     I provided 15 minutes total of non-face-to-face time during this encounter including median intraservice time, reviewing previous notes, labs, imaging,  medications, management and patient verbalized understanding.     Charlott Rakes, MD, FAAFP. [facility] and [place], [place]   07/15/2018, 1:39 PM

## 2018-08-11 MED FILL — ?ATORVASTATIN 20 MG TABLET: 20 | 30 days supply | Qty: 30 | Fill #1

## 2018-08-11 MED FILL — ?METOPROLOL SUCC ER 100MG T: 100 | 30 days supply | Qty: 30 | Fill #1

## 2018-08-11 MED FILL — ?AMLODIPINE BESYLATE 10 MG: 10 | 30 days supply | Qty: 30 | Fill #1

## 2018-08-26 ENCOUNTER — Ambulatory Visit: Payer: Self-pay | Attending: Family Medicine

## 2018-08-26 ENCOUNTER — Other Ambulatory Visit: Payer: Self-pay

## 2018-09-10 MED FILL — METOPROLOL SUCCINATE ER 100: 100 | 30 days supply | Qty: 30 | Fill #2

## 2018-09-10 MED FILL — ?AMLODIPINE BESYLATE 10 MG: 10 | 30 days supply | Qty: 30 | Fill #2

## 2018-09-10 MED FILL — ?ATORVASTATIN 20 MG TABLET: 20 | 30 days supply | Qty: 30 | Fill #2

## 2018-10-09 MED FILL — METOPROLOL SUCCINATE ER 100: 100 | 30 days supply | Qty: 30 | Fill #3

## 2018-10-09 MED FILL — ?ATORVASTATIN 20 MG TABLET: 20 | 30 days supply | Qty: 30 | Fill #3

## 2018-10-09 MED FILL — ?AMLODIPINE BESYLATE 10 MG: 10 | 30 days supply | Qty: 30 | Fill #3

## 2018-11-17 ENCOUNTER — Ambulatory Visit: Payer: Self-pay | Attending: Family Medicine | Admitting: Pharmacist

## 2018-11-17 ENCOUNTER — Other Ambulatory Visit: Payer: Self-pay

## 2018-11-17 DIAGNOSIS — Z23 Encounter for immunization: Secondary | ICD-10-CM

## 2018-11-17 NOTE — Progress Notes (Signed)
Patient presents for vaccination against influenza per orders of Dr. Newlin. Consent given. Counseling provided. No contraindications exists. Vaccine administered without incident.   

## 2018-12-08 ENCOUNTER — Other Ambulatory Visit: Payer: Self-pay | Admitting: Family Medicine

## 2018-12-08 DIAGNOSIS — E7800 Pure hypercholesterolemia, unspecified: Secondary | ICD-10-CM

## 2018-12-08 DIAGNOSIS — I10 Essential (primary) hypertension: Secondary | ICD-10-CM

## 2018-12-08 MED FILL — ?AMLODIPINE BESYLATE 10 MG: 10 | 30 days supply | Qty: 30 | Fill #0

## 2018-12-08 MED FILL — METOPROLOL SUCCINATE ER 100: 100 | 30 days supply | Qty: 30 | Fill #0

## 2018-12-08 MED FILL — ?ATORVASTATIN 20 MG TABLET: 20 | 30 days supply | Qty: 30 | Fill #0

## 2018-12-25 ENCOUNTER — Other Ambulatory Visit: Payer: Self-pay

## 2018-12-25 ENCOUNTER — Ambulatory Visit: Payer: Self-pay | Attending: Family Medicine | Admitting: Physician Assistant

## 2018-12-25 VITALS — BP 136/66 | HR 75 | Temp 98.2°F | Ht 67.0 in | Wt 105.0 lb

## 2018-12-25 DIAGNOSIS — R42 Dizziness and giddiness: Secondary | ICD-10-CM

## 2018-12-25 DIAGNOSIS — H6983 Other specified disorders of Eustachian tube, bilateral: Secondary | ICD-10-CM

## 2018-12-25 DIAGNOSIS — R636 Underweight: Secondary | ICD-10-CM

## 2018-12-25 MED ORDER — MECLIZINE HCL 25 MG PO TABS: 12.5000 mg | ORAL_TABLET | Freq: Three times a day (TID) | ORAL | 0 refills | Status: DC | PRN

## 2018-12-25 MED ORDER — FLUTICASONE PROPIONATE 50 MCG/ACT NA SUSP: 2.0000 | Freq: Every day | NASAL | 6 refills | Status: DC

## 2018-12-25 MED FILL — ?MECLIZINE 25MG TAB: 25 | 20 days supply | Qty: 30 | Fill #0

## 2018-12-25 MED FILL — FLUTICASONE PROP 50 MCG SPR: 50 | 30 days supply | Qty: 16 | Fill #0

## 2018-12-25 NOTE — Patient Instructions (Signed)

## 2018-12-25 NOTE — Progress Notes (Signed)
Patient states that she gets dizzy sometimes while walking or just sitting and watching TV.

## 2018-12-25 NOTE — Progress Notes (Signed)
Patient ID: Camaya Lewi, female   DOB: 12/02/1960, 58 y.o.   MRN: [id number]   Marolyn Trotter, is a 58 y.o. female  [id number]  [id number]  DOB - 1960/12/03  Subjective:  Chief Complaint and HPI: Yasmen Gallick is a 58 y.o. female here today to with complaints of intermittent dizziness for the past few weeks.  Feeling lightheaded and having dry mouth at times.  Appetite is good(althouogh she is extremely thin).  No abdominal pain or constipation/diarrhea.  Occasionally she feels her heart is racing but that is usu if she misses her metoprolol.  Denies new/different routine or medications.  Denies new foods.  dizziness is exacerbated by sudden head movements/turning head to the side.  Also worsened by position changes at times.  No CP/chest pressure.  No SOB.  No vision changes.  ROS:   Constitutional:  No f/c, No night sweats, No unexplained weight loss. EENT:  No vision changes, No blurry vision, No hearing changes. No mouth, throat, or ear problems.  Respiratory: No cough, No SOB Cardiac: No CP, no palpitations GI:  No abd pain, No N/V/D. GU: No Urinary s/sx Musculoskeletal: No joint pain Neuro: No headache, + dizziness, no motor weakness.  Skin: No rash Endocrine:  No polydipsia. No polyuria.  Psych: Denies SI/HI  No problems updated.  ALLERGIES: Allergies  Allergen Reactions  . Doxycycline Rash    PAST MEDICAL HISTORY: Past Medical History:  Diagnosis Date  . Abscess of right thigh 07/25/2011  . Abscess of thigh    right  . Anxiety state 12/21/2015  . Cough 08/12/2013  . Hypertension   . Low grade squamous intraepithelial lesion (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 Pathology negative 06/2016: LSIL/no hpv testing done  . Other emphysema ([place]) 08/12/2013  . Pure hypercholesterolemia 12/22/2015  . Rash 08/21/2011  . Smoking 08/12/2013  . Xerosis of skin 02/21/2017     MEDICATIONS AT HOME: Prior to Admission medications   Medication Sig Start Date End Date Taking? Authorizing Provider  amLODipine (NORVASC) 10 MG tablet TAKE 1 TABLET (10 MG TOTAL) BY MOUTH DAILY. 12/08/18  Yes Newlin, Charlane Ferretti, MD  atorvastatin (LIPITOR) 20 MG tablet TAKE 1 TABLET (20 MG TOTAL) BY MOUTH DAILY. 12/08/18  Yes Charlott Rakes, MD  carboxymethylcellulose (REFRESH PLUS) 0.5 % SOLN Place 1 drop into both eyes 3 (three) times daily as needed (dry eyes).   Yes [provider]  ibuprofen (ADVIL,MOTRIN) 200 MG tablet Take 400 mg by mouth every 6 (six) hours as needed for moderate pain.   Yes [provider]  metoprolol succinate (TOPROL-XL) 100 MG 24 hr tablet TAKE 1 TABLET (100 MG TOTAL) BY MOUTH DAILY. 12/08/18  Yes Charlott Rakes, MD  Multiple Vitamin (MULTIVITAMIN) capsule Take 1 capsule by mouth daily.   Yes [provider]  sodium chloride (OCEAN) 0.65 % SOLN nasal spray Place 1 spray into both nostrils as needed for congestion.   Yes [provider]  Vitamin D, Ergocalciferol, 2000 units CAPS Take 2,000 Units by mouth daily.   Yes [provider]  fluticasone (FLONASE) 50 MCG/ACT nasal spray Place 2 sprays into both nostrils daily. 12/25/18   Argentina Donovan, PA-C  meclizine (ANTIVERT) 25 MG tablet Take 0.5 tablets (12.5 mg total) by mouth 3 (three) times daily as needed for dizziness. 12/25/18   Argentina Donovan, PA-C  triamcinolone cream (KENALOG) 0.1 % Apply 1 application topically 2 (  two) times daily. Patient not taking: Reported on 04/24/2018 10/02/17   Charlott Rakes, MD     Objective:  EXAM:   Vitals:   12/25/18 0856  BP: 136/66  Pulse: 75  Temp: 98.2 F (36.8 C)  TempSrc: Oral  SpO2: 100%  Weight: 105 lb (47.6 kg)  Height: 5\' 7"  (1.702 m)    General appearance : A&OX3. NAD. Non-toxic-appearing; very thin HEENT: Atraumatic and Normocephalic.  PERRLA. EOM intact.  TM clear B. Mouth-MMM, post pharynx WNL w/o erythema,  No PND. Neck: supple, no JVD. No cervical lymphadenopathy. No thyromegaly Chest/Lungs:  Breathing-non-labored, Good air entry bilaterally, breath sounds normal without rales, rhonchi, or wheezing  CVS: S1 S2 regular, no murmurs, gallops, rubs  Extremities: Bilateral Lower Ext shows no edema, both legs are warm to touch with = pulse throughout Neurology:  CN II-XII grossly intact, Non focal.   Psych:  TP linear. J/I WNL. Normal speech. Appropriate eye contact and affect.  Skin:  No Rash  Data Review Lab Results  Component Value Date   HGBA1C 5.3 04/20/2013     Assessment & Plan   1. Dizziness No red flags on exam.  hydration imperative along with adequate nutrition.   - Thyroid Panel With TSH - CBC with Differential - Comprehensive metabolic panel - meclizine (ANTIVERT) 25 MG tablet; Take 0.5 tablets (12.5 mg total) by mouth 3 (three) times daily as needed for dizziness.  Dispense: 30 tablet; Refill: 0 - fluticasone (FLONASE) 50 MCG/ACT nasal spray; Place 2 sprays into both nostrils daily.  Dispense: 16 g; Refill: 6 Call 911 if worsens or develops CP. -EKG WNL today.  No ST changes. Normal sinus rhythm 2. Dysfunction of both eustachian tubes Hall-pyke maneuvers - fluticasone (FLONASE) 50 MCG/ACT nasal spray; Place 2 sprays into both nostrils daily.  Dispense: 16 g; Refill: 6  3. Underweight Discussed proper nutrition and adequate caloric intake.  Should try to gain a few pounds.     Patient have been counseled extensively about nutrition and exercise  Return in about 1 month (around 01/24/2019) for PCP;  dizziness.  The patient was given clear instructions to go to ER or return to medical center if symptoms don't improve, worsen or new problems develop. The patient verbalized understanding. The patient was told to call to get lab results if they haven't heard anything in the next week.     Freeman Caldron, PA-C [facility] and [facility] [place], [place]   12/25/2018, 10:55 AM

## 2018-12-26 ENCOUNTER — Other Ambulatory Visit: Payer: Self-pay | Admitting: Family Medicine

## 2018-12-26 DIAGNOSIS — I10 Essential (primary) hypertension: Secondary | ICD-10-CM

## 2018-12-26 DIAGNOSIS — E7800 Pure hypercholesterolemia, unspecified: Secondary | ICD-10-CM

## 2018-12-26 LAB — CBC WITH DIFFERENTIAL/PLATELET
Basophils Absolute: 0.1 10*3/uL (ref 0.0–0.2)
Basos: 1 %
EOS (ABSOLUTE): 0.1 10*3/uL (ref 0.0–0.4)
Eos: 1 %
Hematocrit: 40.6 % (ref 34.0–46.6)
Hemoglobin: 14 g/dL (ref 11.1–15.9)
Immature Grans (Abs): 0 10*3/uL (ref 0.0–0.1)
Immature Granulocytes: 0 %
Lymphocytes Absolute: 1.6 10*3/uL (ref 0.7–3.1)
Lymphs: 25 %
MCH: 33.1 pg — ABNORMAL HIGH (ref 26.6–33.0)
MCHC: 34.5 g/dL (ref 31.5–35.7)
MCV: 96 fL (ref 79–97)
Monocytes Absolute: 0.9 10*3/uL (ref 0.1–0.9)
Monocytes: 14 %
Neutrophils Absolute: 3.9 10*3/uL (ref 1.4–7.0)
Neutrophils: 59 %
Platelets: 271 10*3/uL (ref 150–450)
RBC: 4.23 x10E6/uL (ref 3.77–5.28)
RDW: 12.7 % (ref 11.7–15.4)
WBC: 6.6 10*3/uL (ref 3.4–10.8)

## 2018-12-26 LAB — COMPREHENSIVE METABOLIC PANEL
ALT: 30 IU/L (ref 0–32)
AST: 28 IU/L (ref 0–40)
Albumin/Globulin Ratio: 2 (ref 1.2–2.2)
Albumin: 4.9 g/dL (ref 3.8–4.9)
Alkaline Phosphatase: 88 IU/L (ref 39–117)
BUN/Creatinine Ratio: 15 (ref 9–23)
BUN: 10 mg/dL (ref 6–24)
Bilirubin Total: 0.4 mg/dL (ref 0.0–1.2)
CO2: 21 mmol/L (ref 20–29)
Calcium: 10.2 mg/dL (ref 8.7–10.2)
Chloride: 101 mmol/L (ref 96–106)
Creatinine, Ser: 0.66 mg/dL (ref 0.57–1.00)
GFR calc Af Amer: 113 mL/min/{1.73_m2} (ref >59)
GFR calc non Af Amer: 98 mL/min/{1.73_m2} (ref >59)
Globulin, Total: 2.4 g/dL (ref 1.5–4.5)
Glucose: 95 mg/dL (ref 65–99)
Potassium: 4.6 mmol/L (ref 3.5–5.2)
Sodium: 139 mmol/L (ref 134–144)
Total Protein: 7.3 g/dL (ref 6.0–8.5)

## 2018-12-26 LAB — THYROID PANEL WITH TSH
Free Thyroxine Index: 2.2 (ref 1.2–4.9)
T3 Uptake Ratio: 27 % (ref 24–39)
T4, Total: 8.3 ug/dL (ref 4.5–12.0)
TSH: 0.87 u[IU]/mL (ref 0.450–4.500)

## 2018-12-29 ENCOUNTER — Telehealth: Payer: Self-pay | Admitting: Internal Medicine

## 2018-12-29 NOTE — Telephone Encounter (Signed)
Patient c/o Palpitations:  High priority if patient c/o lightheadedness, shortness of breath, or chest pain  1) How long have you had palpitations/irregular HR/ Afib? Are you having the symptoms now?  3 weeks off and on  2) Are you currently experiencing lightheadedness, SOB or CP? Lightheaded, but not at this time  3) Do you have a history of afib (atrial fibrillation) or irregular heart rhythm? yes    4) Have you checked your BP or HR? (document readings if available): it was high last week  5) Are you experiencing any other symptoms?  No- pt wanted an appointment-I made pt an appointment for tomorrow

## 2018-12-30 ENCOUNTER — Ambulatory Visit: Payer: No Typology Code available for payment source | Admitting: Internal Medicine

## 2018-12-30 ENCOUNTER — Encounter: Payer: Self-pay | Admitting: Internal Medicine

## 2018-12-30 ENCOUNTER — Other Ambulatory Visit: Payer: Self-pay

## 2018-12-30 VITALS — BP 134/70 | HR 70 | Ht 67.0 in | Wt 104.8 lb

## 2018-12-30 DIAGNOSIS — I10 Essential (primary) hypertension: Secondary | ICD-10-CM

## 2018-12-30 DIAGNOSIS — R002 Palpitations: Secondary | ICD-10-CM

## 2018-12-30 DIAGNOSIS — I471 Supraventricular tachycardia: Secondary | ICD-10-CM

## 2018-12-30 MED ORDER — AMLODIPINE BESYLATE 10 MG PO TABS: 10.0000 mg | ORAL_TABLET | Freq: Every day | ORAL | 0 refills | Status: DC

## 2018-12-30 MED ORDER — METOPROLOL SUCCINATE ER 100 MG PO TB24: 100.0000 mg | ORAL_TABLET | Freq: Every day | ORAL | 0 refills | Status: DC

## 2018-12-30 MED ORDER — ATORVASTATIN CALCIUM 20 MG PO TABS: 20.0000 mg | ORAL_TABLET | Freq: Every day | ORAL | 0 refills | Status: DC

## 2018-12-30 NOTE — Telephone Encounter (Signed)
Patient seen today by Dr. Taylor 

## 2018-12-30 NOTE — Progress Notes (Signed)
HPI Melanie Harris returns today for followup. She is a pleasant 58 yo woman with HTN and palpitations due to PAC's and PVC's. She has done fairly well but noted over the past few weeks to have more frequent symptoms. She denies caffeine or ETOH excess. She has never had syncope.  Allergies  Allergen Reactions  . Doxycycline Rash     Current Outpatient Medications  Medication Sig Dispense Refill  . amLODipine (NORVASC) 10 MG tablet TAKE 1 TABLET (10 MG TOTAL) BY MOUTH DAILY. 30 tablet 0  . atorvastatin (LIPITOR) 20 MG tablet TAKE 1 TABLET (20 MG TOTAL) BY MOUTH DAILY. 30 tablet 0  . carboxymethylcellulose (REFRESH PLUS) 0.5 % SOLN Place 1 drop into both eyes 3 (three) times daily as needed (dry eyes).    . fluticasone (FLONASE) 50 MCG/ACT nasal spray Place 2 sprays into both nostrils daily. 16 g 6  . ibuprofen (ADVIL,MOTRIN) 200 MG tablet Take 400 mg by mouth every 6 (six) hours as needed for moderate pain.    . meclizine (ANTIVERT) 25 MG tablet Take 0.5 tablets (12.5 mg total) by mouth 3 (three) times daily as needed for dizziness. 30 tablet 0  . metoprolol succinate (TOPROL-XL) 100 MG 24 hr tablet TAKE 1 TABLET (100 MG TOTAL) BY MOUTH DAILY. 30 tablet 0  . Multiple Vitamin (MULTIVITAMIN) capsule Take 1 capsule by mouth daily.    . sodium chloride (OCEAN) 0.65 % SOLN nasal spray Place 1 spray into both nostrils as needed for congestion.    . triamcinolone cream (KENALOG) 0.1 % Apply 1 application topically 2 (two) times daily. 85.2 g 0  . Vitamin D, Ergocalciferol, 2000 units CAPS Take 2,000 Units by mouth daily.     No current facility-administered medications for this visit.      Past Medical History:  Diagnosis Date  . Abscess of right thigh 07/25/2011  . Abscess of thigh    right  . Anxiety state 12/21/2015  . Cough 08/12/2013  . Hypertension   . Low grade squamous intraepithelial lesion (LGSIL) on cervical Pap smear 07/03/2016   [ ]  rpt cytology and HPV testing 06/2017   2002: TAH for benign indications. Pathology negative 06/2016: LSIL/no hpv testing done  . Other emphysema ([place]) 08/12/2013  . Pure hypercholesterolemia 12/22/2015  . Rash 08/21/2011  . Smoking 08/12/2013  . Xerosis of skin 02/21/2017    ROS:   All systems reviewed and negative except as noted in the HPI.   Past Surgical History:  Procedure Laterality Date  . ABDOMINAL HYSTERECTOMY  2004  . TONSILLECTOMY  10-12 yrs old     Family History  Problem Relation Age of Onset  . Heart failure Mother        started in her 69's  . Hypertension Mother   . Hyperlipidemia Mother   . Lung cancer Father   . Hypertension Father   . Hypercholesterolemia Father   . Diabetes Sister        diet controlled  . Dysrhythmia Sister        possible in the past  . Healthy Daughter      Social History   Socioeconomic History  . Marital status: Divorced    Spouse name: Not on file  . Number of children: Not on file  . Years of education: 1  . Highest education level: 12th grade  Occupational History  . Not on file  Social Needs  . Financial resource strain: Not on file  . Food  insecurity    Worry: Not on file    Inability: Not on file  . Transportation needs    Medical: No    Non-medical: No  Tobacco Use  . Smoking status: Former Smoker    Packs/day: 0.25    Years: 30.00    Pack years: 7.50    Types: Cigarettes    Quit date: 05/15/2018    Years since quitting: 0.6  . Smokeless tobacco: Never Used  . Tobacco comment: 1-3 cigs daily  Substance and Sexual Activity  . Alcohol use: Yes    Alcohol/week: 1.0 standard drinks    Types: 1 Glasses of wine per week    Comment: occassional  . Drug use: No  . Sexual activity: Not Currently  Lifestyle  . Physical activity    Days per week: Not on file    Minutes per session: Not on file  . Stress: Not on file  Relationships  . Social Herbalist on phone: Not on file    Gets together: Not on file    Attends religious service: Not  on file    Active member of club or organization: Not on file    Attends meetings of clubs or organizations: Not on file    Relationship status: Not on file  . Intimate partner violence    Fear of current or ex partner: Not on file    Emotionally abused: Not on file    Physically abused: Not on file    Forced sexual activity: Not on file  Other Topics Concern  . Not on file  Social History Narrative  . Not on file     BP 134/70   Pulse 70   Ht 5\' 7"  (1.702 m)   Wt 104 lb 12.8 oz (47.5 kg)   SpO2 97%   BMI 16.41 kg/m   Physical Exam:  Thin, well appearing middle aged woman, NAD HEENT: Unremarkable Neck:  No JVD, no thyromegally Lymphatics:  No adenopathy Back:  No CVA tenderness Lungs:  Clear with no wheezes HEART:  Regular rate rhythm, no murmurs, no rubs, no clicks Abd:  soft, positive bowel sounds, no organomegally, no rebound, no guarding Ext:  2 plus pulses, no edema, no cyanosis, no clubbing Skin:  No rashes no nodules Neuro:  CN II through XII intact, motor grossly intact  EKG - NSR   Assess/Plan: 1. PAC's/PVC's - I have discussed the treatment options with the patient and while adding flecainide would be an option, I encouraged her to avoid this for now due to the benign nature of her condition. If her symptoms were to worsen then I would ask her to start low dose flecainide.  2. HTN - her SBP is fairly well controlled. No change.  Mikle Bosworth.D.

## 2018-12-30 NOTE — Patient Instructions (Signed)

## 2019-01-12 MED FILL — ?METOPROLOL SUCC ER 100MG T: 100 | 30 days supply | Qty: 30 | Fill #0

## 2019-01-12 MED FILL — ?ATORVASTATIN 20 MG TABLET: 20 | 30 days supply | Qty: 30 | Fill #0

## 2019-01-12 MED FILL — ?AMLODIPINE BESYLATE 10 MG: 10 | 30 days supply | Qty: 30 | Fill #0

## 2019-01-26 ENCOUNTER — Ambulatory Visit: Payer: Self-pay | Attending: Family Medicine | Admitting: Family Medicine

## 2019-01-26 ENCOUNTER — Other Ambulatory Visit: Payer: Self-pay

## 2019-01-26 ENCOUNTER — Encounter: Payer: Self-pay | Admitting: Family Medicine

## 2019-01-26 DIAGNOSIS — I493 Ventricular premature depolarization: Secondary | ICD-10-CM

## 2019-01-26 DIAGNOSIS — R42 Dizziness and giddiness: Secondary | ICD-10-CM

## 2019-01-26 DIAGNOSIS — I10 Essential (primary) hypertension: Secondary | ICD-10-CM

## 2019-01-26 DIAGNOSIS — E7800 Pure hypercholesterolemia, unspecified: Secondary | ICD-10-CM

## 2019-01-26 MED ORDER — AMLODIPINE BESYLATE 10 MG PO TABS: 10.0000 mg | ORAL_TABLET | Freq: Every day | ORAL | 6 refills | Status: DC

## 2019-01-26 MED ORDER — METOPROLOL SUCCINATE ER 100 MG PO TB24: 100.0000 mg | ORAL_TABLET | Freq: Every day | ORAL | 6 refills | Status: DC

## 2019-01-26 MED ORDER — MECLIZINE HCL 25 MG PO TABS: 12.5000 mg | ORAL_TABLET | Freq: Three times a day (TID) | ORAL | 0 refills | Status: DC | PRN

## 2019-01-26 MED ORDER — ATORVASTATIN CALCIUM 20 MG PO TABS: 20.0000 mg | ORAL_TABLET | Freq: Every day | ORAL | 6 refills | Status: DC

## 2019-01-26 MED FILL — ?MECLIZINE 25MG TAB: 25 | 20 days supply | Qty: 30 | Fill #0

## 2019-01-26 NOTE — Progress Notes (Signed)
Patient has been called and DOB has been verified. Patient has been screened and transferred to PCP to start phone visit.     

## 2019-01-26 NOTE — Progress Notes (Signed)
Virtual Visit via Telephone Note  I connected with Melanie Harris, on 01/26/2019 at 10:44 AM by telephone due to the COVID-19 pandemic and verified that I am speaking with the correct person using two identifiers.   Consent: I discussed the limitations, risks, security and privacy concerns of performing an evaluation and management service by telephone and the availability of in person appointments. I also discussed with the patient that there may be a patient responsible charge related to this service. The patient expressed understanding and agreed to proceed.   Location of Patient: Home  Location of Provider: Clinic   Persons participating in Telemedicine visit: Yiselle Oliverson Farrington-CMA Dr. Margarita Rana     History of Present Illness: Melanie Harris is a 58 year old female with hypertension, hyperlipidemia, previous tobacco abuse seen today for a follow-up visit. At her last visit with the physician assistant last month she was treated for  dizziness and eustachian tube dysfunction-treated with meclizine.  Her dizziness is much better and she has not needed Meclizine lately; she thinks dehydration played a role in her symptoms as she now tries to stay well-hydrated. Every now and then her heart would skip a beat and frequency is about twice a week. Seen by Dr Cristopher Peru, PACs, PVCs thought to be benign with plan for low-dose flecainide if symptoms worsen and advised to return in 1 year.  She has had to wear heart monitor a couple of years ago for same. Denies presence of chest pain, dyspnea, pedal edema  Past Medical History:  Diagnosis Date  . Abscess of right thigh 07/25/2011  . Abscess of thigh    right  . Anxiety state 12/21/2015  . Cough 08/12/2013  . Hypertension   . Low grade squamous intraepithelial lesion (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 Pathology negative  06/2016: LSIL/no hpv testing done  . Other emphysema ([place]) 08/12/2013  . Pure hypercholesterolemia 12/22/2015  . Rash 08/21/2011  . Smoking 08/12/2013  . Xerosis of skin 02/21/2017   Allergies  Allergen Reactions  . Doxycycline Rash    Current Outpatient Medications on File Prior to Visit  Medication Sig Dispense Refill  . amLODipine (NORVASC) 10 MG tablet Take 1 tablet (10 mg total) by mouth daily. 30 tablet 0  . atorvastatin (LIPITOR) 20 MG tablet Take 1 tablet (20 mg total) by mouth daily. 30 tablet 0  . carboxymethylcellulose (REFRESH PLUS) 0.5 % SOLN Place 1 drop into both eyes 3 (three) times daily as needed (dry eyes).    . fluticasone (FLONASE) 50 MCG/ACT nasal spray Place 2 sprays into both nostrils daily. 16 g 6  . ibuprofen (ADVIL,MOTRIN) 200 MG tablet Take 400 mg by mouth every 6 (six) hours as needed for moderate pain.    . meclizine (ANTIVERT) 25 MG tablet Take 0.5 tablets (12.5 mg total) by mouth 3 (three) times daily as needed for dizziness. 30 tablet 0  . metoprolol succinate (TOPROL-XL) 100 MG 24 hr tablet Take 1 tablet (100 mg total) by mouth daily. 30 tablet 0  . Multiple Vitamin (MULTIVITAMIN) capsule Take 1 capsule by mouth daily.    . sodium chloride (OCEAN) 0.65 % SOLN nasal spray Place 1 spray into both nostrils as needed for congestion.    . triamcinolone cream (KENALOG) 0.1 % Apply 1 application topically 2 (two) times daily. 85.2 g 0  . Vitamin D, Ergocalciferol, 2000 units CAPS Take 2,000 Units  by mouth daily.     No current facility-administered medications on file prior to visit.    Observations/Objective: Awake, alert, oriented x3 Not in acute distress  CMP Latest Ref Rng & Units 12/25/2018 02/13/2018 05/31/2017  Glucose 65 - 99 mg/dL 95 77 89  BUN 6 - 24 mg/dL 10 14 12   Creatinine 0.57 - 1.00 mg/dL 0.66 0.69 0.56(L)  Sodium 134 - 144 mmol/L 139 140 140  Potassium 3.5 - 5.2 mmol/L 4.6 3.9 4.1  Chloride 96 - 106 mmol/L 101 99 102  CO2 20 - 29 mmol/L 21 22  22   Calcium 8.7 - 10.2 mg/dL 10.2 9.9 9.6  Total Protein 6.0 - 8.5 g/dL 7.3 7.1 6.9  Total Bilirubin 0.0 - 1.2 mg/dL 0.4 0.6 0.3  Alkaline Phos 39 - 117 IU/L 88 93 93  AST 0 - 40 IU/L 28 44(H) 21  ALT 0 - 32 IU/L 30 44(H) 28     Lipid Panel     Component Value Date/Time   CHOL 180 02/13/2018 1535   TRIG 89 02/13/2018 1535   HDL 107 02/13/2018 1535   CHOLHDL 1.7 02/13/2018 1535   CHOLHDL 1.7 12/21/2015 1230   VLDL 14 12/21/2015 1230   LDLCALC 55 02/13/2018 1535   LABVLDL 18 02/13/2018 1535    Lab Results  Component Value Date   TSH 0.870 12/25/2018    Assessment and Plan: 1. Essential hypertension Controlled Counseled on blood pressure goal of less than 130/80, low-sodium, DASH diet, medication compliance, 150 minutes of moderate intensity exercise per week. Discussed medication compliance, adverse effects. - amLODipine (NORVASC) 10 MG tablet; Take 1 tablet (10 mg total) by mouth daily.  Dispense: 30 tablet; Refill: 6 - metoprolol succinate (TOPROL-XL) 100 MG 24 hr tablet; Take 1 tablet (100 mg total) by mouth daily.  Dispense: 30 tablet; Refill: 6  2. Pure hypercholesterolemia Controlled Low-cholesterol diet - atorvastatin (LIPITOR) 20 MG tablet; Take 1 tablet (20 mg total) by mouth daily.  Dispense: 30 tablet; Refill: 6  3. Dizziness Improved - meclizine (ANTIVERT) 25 MG tablet; Take 0.5 tablets (12.5 mg total) by mouth 3 (three) times daily as needed for dizziness.  Dispense: 30 tablet; Refill: 0  4. PVC (premature ventricular contraction) Ongoing symptoms Advised that if symptoms persist at this frequency she will need to contact cardiology for initiation of low-dose flecainide  Due for colonoscopy-I had referred her in 06/2018 however she states she is trying to hold off on this due to the ongoing pandemic.  Follow Up Instructions: 6 months   I discussed the assessment and treatment plan with the patient. The patient was provided an opportunity to ask  questions and all were answered. The patient agreed with the plan and demonstrated an understanding of the instructions.   The patient was advised to call back or seek an in-person evaluation if the symptoms worsen or if the condition fails to improve as anticipated.     I provided 15 minutes total of non-face-to-face time during this encounter including median intraservice time, reviewing previous notes, labs, imaging, medications, management and patient verbalized understanding.     Charlott Rakes, MD, FAAFP. [facility] and [place], [place]   01/26/2019, 10:44 AM

## 2019-02-09 MED FILL — ?ATORVASTATIN 20 MG TABLET: 20 | 30 days supply | Qty: 30 | Fill #0

## 2019-02-09 MED FILL — ?METOPROLOL SUCC ER 100MG T: 100 | 30 days supply | Qty: 30 | Fill #0

## 2019-02-09 MED FILL — ?AMLODIPINE BESYLATE 10 MG: 10 | 30 days supply | Qty: 30 | Fill #0

## 2019-03-11 MED FILL — ?ATORVASTATIN 20 MG TABLET: 20 | 30 days supply | Qty: 30 | Fill #1

## 2019-03-11 MED FILL — METOPROLOL SUCCINATE ER 100: 100 | 30 days supply | Qty: 30 | Fill #1

## 2019-03-11 MED FILL — AMLODIPINE BESYLATE 10 MG T: 10 | 30 days supply | Qty: 30 | Fill #1

## 2019-04-13 MED FILL — AMLODIPINE BESYLATE 10 MG T: 10 | 30 days supply | Qty: 30 | Fill #2

## 2019-04-13 MED FILL — ?ATORVASTATIN 20 MG TABLET: 20 | 30 days supply | Qty: 30 | Fill #2

## 2019-04-13 MED FILL — METOPROLOL SUCCINATE ER 100: 100 | 30 days supply | Qty: 30 | Fill #2

## 2019-04-22 ENCOUNTER — Other Ambulatory Visit: Payer: Self-pay

## 2019-04-22 ENCOUNTER — Ambulatory Visit: Payer: Self-pay | Attending: Family Medicine

## 2019-05-05 ENCOUNTER — Telehealth: Payer: Self-pay | Admitting: Internal Medicine

## 2019-05-05 NOTE — Telephone Encounter (Signed)
I spoke with patient. Yesterday around 4 PM she had episode of chest heaviness. Later in the day she felt heart skipping. This happened twice so EMS was called. EKG and BP were OK per patient report. Prior to last night she had episode of pain/palpitations last Friday.  Episode yesterday was worse than she had in the past when she had seen Dr Lovena Le. She is feeling fine today. I scheduled patient to see Oda Kilts, PA on 05/08/19 at 8:00.

## 2019-05-05 NOTE — Telephone Encounter (Signed)
Patient c/o Palpitations:  High priority if patient c/o lightheadedness, shortness of breath, or chest pain  1) How long have you had palpitations/irregular HR/ Afib? Are you having the symptoms now? Yesterday chest felt heavy as if something was on it  2) Are you currently experiencing lightheadedness, SOB or CP? no  3) Do you have a history of afib (atrial fibrillation) or irregular heart rhythm? yes  4) Have you checked your BP or HR? (document readings if available) no not today  5) Are you experiencing any other symptoms? No, but patient has to call [facility] on last night they told her to do a f/u but there are no apts till late April. Please call patient back.

## 2019-05-08 ENCOUNTER — Encounter: Payer: Self-pay | Admitting: Student

## 2019-05-08 ENCOUNTER — Other Ambulatory Visit: Payer: Self-pay

## 2019-05-08 ENCOUNTER — Ambulatory Visit (INDEPENDENT_AMBULATORY_CARE_PROVIDER_SITE_OTHER): Payer: Self-pay | Admitting: Student

## 2019-05-08 VITALS — BP 132/68 | HR 85 | Ht 67.0 in | Wt 98.6 lb

## 2019-05-08 DIAGNOSIS — I10 Essential (primary) hypertension: Secondary | ICD-10-CM

## 2019-05-08 DIAGNOSIS — R002 Palpitations: Secondary | ICD-10-CM

## 2019-05-08 DIAGNOSIS — I471 Supraventricular tachycardia: Secondary | ICD-10-CM

## 2019-05-08 LAB — TSH: TSH: 1.16 u[IU]/mL (ref 0.450–4.500)

## 2019-05-08 LAB — COMPREHENSIVE METABOLIC PANEL
ALT: 30 IU/L (ref 0–32)
AST: 38 IU/L (ref 0–40)
Albumin/Globulin Ratio: 2.2 (ref 1.2–2.2)
Albumin: 5.1 g/dL — ABNORMAL HIGH (ref 3.8–4.9)
Alkaline Phosphatase: 103 IU/L (ref 39–117)
BUN/Creatinine Ratio: 21 (ref 9–23)
BUN: 14 mg/dL (ref 6–24)
Bilirubin Total: 0.5 mg/dL (ref 0.0–1.2)
CO2: 24 mmol/L (ref 20–29)
Calcium: 9.8 mg/dL (ref 8.7–10.2)
Chloride: 101 mmol/L (ref 96–106)
Creatinine, Ser: 0.66 mg/dL (ref 0.57–1.00)
GFR calc Af Amer: 113 mL/min/{1.73_m2} (ref >59)
GFR calc non Af Amer: 98 mL/min/{1.73_m2} (ref >59)
Globulin, Total: 2.3 g/dL (ref 1.5–4.5)
Glucose: 102 mg/dL — ABNORMAL HIGH (ref 65–99)
Potassium: 4.1 mmol/L (ref 3.5–5.2)
Sodium: 140 mmol/L (ref 134–144)
Total Protein: 7.4 g/dL (ref 6.0–8.5)

## 2019-05-08 LAB — CBC
Hematocrit: 41.7 % (ref 34.0–46.6)
Hemoglobin: 14.3 g/dL (ref 11.1–15.9)
MCH: 33.3 pg — ABNORMAL HIGH (ref 26.6–33.0)
MCHC: 34.3 g/dL (ref 31.5–35.7)
MCV: 97 fL (ref 79–97)
Platelets: 269 10*3/uL (ref 150–450)
RBC: 4.3 x10E6/uL (ref 3.77–5.28)
RDW: 12.4 % (ref 11.7–15.4)
WBC: 7.1 10*3/uL (ref 3.4–10.8)

## 2019-05-08 LAB — MAGNESIUM: Magnesium: 1.8 mg/dL (ref 1.6–2.3)
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
## 2019-05-08 NOTE — Patient Instructions (Addendum)
Medication Instructions:  none *If you need a refill on your cardiac medications before your next appointment, please call your pharmacy*   Lab Work:  TODAY CMET MAGNESIUM CBC TSH If you have labs (blood work) drawn today and your tests are completely normal, you will receive your results only by: Marland Kitchen MyChart Message (if you have MyChart) OR . A paper copy in the mail If you have any lab test that is abnormal or we need to change your treatment, we will call you to review the results.   Testing/Procedures: none   Follow-Up: At [facility], you and your health needs are our priority.  As part of our continuing mission to provide you with exceptional heart care, we have created designated Provider Care Teams.  These Care Teams include your primary Cardiologist (physician) and Advanced Practice Providers (APPs -  Physician Assistants and Nurse Practitioners) who all work together to provide you with the care you need, when you need it.  Your next appointment:   6 MONTHS  The format for your next appointment:   Either In Person or Virtual  Provider:   Dr Lovena Le   Other Instructions

## 2019-05-11 ENCOUNTER — Other Ambulatory Visit: Payer: Self-pay | Admitting: Student

## 2019-05-11 ENCOUNTER — Telehealth: Payer: Self-pay

## 2019-05-11 MED ORDER — MAGNESIUM OXIDE 400 MG PO TABS: 400.0000 mg | ORAL_TABLET | Freq: Every day | ORAL | 3 refills | Status: DC

## 2019-05-11 MED FILL — MAGNESIUM OXIDE 400 MG TAB: 400 | 30 days supply | Qty: 30 | Fill #0

## 2019-05-11 NOTE — Telephone Encounter (Signed)
The patient has been notified of the result and verbalized understanding.  All questions (if any) were answered. Frederik Schmidt, RN 05/11/2019 9:49 AM    The patient will start Magnesium Ox 400 mg Daily.

## 2019-05-11 NOTE — Telephone Encounter (Signed)
-----   Message from Shirley Friar, PA-C sent at 05/11/2019  9:44 AM EDT ----- Labs stable.   Mg borderline low at 1.8.   Could we start her on Mag ox 400 mg daily and let her know that this MAY help her palpitations. Thank you!

## 2019-05-12 MED FILL — METOPROLOL SUCCINATE ER 100: 100 | 30 days supply | Qty: 30 | Fill #3

## 2019-05-12 MED FILL — AMLODIPINE BESYLATE 10 MG T: 10 | 30 days supply | Qty: 30 | Fill #3

## 2019-05-12 MED FILL — ?ATORVASTATIN 20 MG TABLET: 20 | 30 days supply | Qty: 30 | Fill #3

## 2019-06-10 MED FILL — MAGNESIUM OXIDE 400 MG TAB: 400 | 30 days supply | Qty: 30 | Fill #1

## 2019-06-10 MED FILL — ?ATORVASTATIN 20 MG TABLET: 20 | 30 days supply | Qty: 30 | Fill #4

## 2019-06-10 MED FILL — AMLODIPINE BESYLATE 10 MG T: 10 | 30 days supply | Qty: 30 | Fill #4

## 2019-06-10 MED FILL — METOPROLOL SUCCINATE ER 100: 100 | 30 days supply | Qty: 30 | Fill #4

## 2019-06-24 ENCOUNTER — Ambulatory Visit: Payer: Self-pay

## 2019-06-24 ENCOUNTER — Other Ambulatory Visit: Payer: Self-pay

## 2019-06-25 ENCOUNTER — Ambulatory Visit: Payer: Self-pay

## 2019-07-13 MED FILL — ?ATORVASTATIN 20 MG TABLET: 20 | 30 days supply | Qty: 30 | Fill #5

## 2019-07-28 ENCOUNTER — Ambulatory Visit: Payer: Self-pay | Attending: Family Medicine | Admitting: Family Medicine

## 2019-07-28 ENCOUNTER — Other Ambulatory Visit: Payer: Self-pay

## 2019-07-28 ENCOUNTER — Encounter: Payer: Self-pay | Admitting: Family Medicine

## 2019-07-28 ENCOUNTER — Other Ambulatory Visit: Payer: Self-pay | Admitting: Family Medicine

## 2019-07-28 VITALS — BP 132/64 | HR 79 | Ht 67.0 in | Wt 100.2 lb

## 2019-07-28 DIAGNOSIS — I10 Essential (primary) hypertension: Secondary | ICD-10-CM

## 2019-07-28 DIAGNOSIS — Z1231 Encounter for screening mammogram for malignant neoplasm of breast: Secondary | ICD-10-CM

## 2019-07-28 DIAGNOSIS — E7800 Pure hypercholesterolemia, unspecified: Secondary | ICD-10-CM

## 2019-07-28 DIAGNOSIS — Z1211 Encounter for screening for malignant neoplasm of colon: Secondary | ICD-10-CM

## 2019-07-28 DIAGNOSIS — I493 Ventricular premature depolarization: Secondary | ICD-10-CM

## 2019-07-28 DIAGNOSIS — F419 Anxiety disorder, unspecified: Secondary | ICD-10-CM

## 2019-07-28 MED ORDER — ATORVASTATIN CALCIUM 20 MG PO TABS: 20.0000 mg | ORAL_TABLET | Freq: Every day | ORAL | 6 refills | Status: DC

## 2019-07-28 MED ORDER — AMLODIPINE BESYLATE 10 MG PO TABS: 10.0000 mg | ORAL_TABLET | Freq: Every day | ORAL | 6 refills | Status: DC

## 2019-07-28 MED ORDER — METOPROLOL SUCCINATE ER 100 MG PO TB24: 100.0000 mg | ORAL_TABLET | Freq: Every day | ORAL | 6 refills | Status: DC

## 2019-07-28 MED ORDER — HYDROXYZINE HCL 25 MG PO TABS: 25.0000 mg | ORAL_TABLET | Freq: Three times a day (TID) | ORAL | 1 refills | Status: DC | PRN

## 2019-07-28 NOTE — Patient Instructions (Signed)
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
## 2019-07-29 MED FILL — hydrOXYzine HCL 25 MG TABS: 25 | 20 days supply | Qty: 60 | Fill #0

## 2019-08-13 MED FILL — METOPROLOL SUCCINATE ER 100: 100 | 30 days supply | Qty: 30 | Fill #6

## 2019-08-13 MED FILL — AMLODIPINE BESYLATE 10 MG T: 10 | 30 days supply | Qty: 30 | Fill #6

## 2019-08-13 MED FILL — ?ATORVASTATIN 20 MG TABLET: 20 | 30 days supply | Qty: 30 | Fill #6

## 2019-08-13 MED FILL — MAGNESIUM OXIDE 400 MG TAB: 400 | 30 days supply | Qty: 30 | Fill #3

## 2019-09-01 ENCOUNTER — Other Ambulatory Visit: Payer: Self-pay | Admitting: Obstetrics and Gynecology

## 2019-09-01 DIAGNOSIS — Z1231 Encounter for screening mammogram for malignant neoplasm of breast: Secondary | ICD-10-CM

## 2019-09-10 MED FILL — ?ATORVASTATIN 20 MG TABLET: 20 | 30 days supply | Qty: 30 | Fill #0

## 2019-09-10 MED FILL — AMLODIPINE BESYLATE 10 MG T: 10 | 30 days supply | Qty: 30 | Fill #0

## 2019-09-10 MED FILL — METOPROLOL SUCCINATE ER 100: 100 | 30 days supply | Qty: 30 | Fill #0

## 2019-09-10 MED FILL — MAGNESIUM OXIDE 400 MG TAB: 400 | 30 days supply | Qty: 30 | Fill #4

## 2019-10-01 ENCOUNTER — Ambulatory Visit
Admission: RE | Admit: 2019-10-01 | Discharge: 2019-10-01 | Disposition: A | Discharge status: Home / Self Care | Payer: No Typology Code available for payment source | Source: Ambulatory Visit | Attending: Obstetrics and Gynecology | Admitting: Obstetrics and Gynecology

## 2019-10-01 ENCOUNTER — Ambulatory Visit: Payer: Self-pay | Admitting: *Deleted

## 2019-10-01 ENCOUNTER — Other Ambulatory Visit: Payer: Self-pay

## 2019-10-01 VITALS — BP 122/60 | Temp 97.1°F | Wt 102.1 lb

## 2019-10-01 DIAGNOSIS — Z1231 Encounter for screening mammogram for malignant neoplasm of breast: Secondary | ICD-10-CM

## 2019-10-01 DIAGNOSIS — Z1239 Encounter for other screening for malignant neoplasm of breast: Secondary | ICD-10-CM

## 2019-10-01 NOTE — Progress Notes (Signed)
Melanie Harris is a 59 y.o. female who presents to [facility] clinic today with no complaints.    Pap Smear: Pap smear not completed today. Last Pap smear was  02/20/2018 at [facility] clinic and was LSIL. Patient referred for a colposcopy 04/24/2018. The colposcopy was not completed and a Pap smear with co-testing recommended in one year. Previous Pap smear was 06/22/2016 at [facility] and LSIL. Pap smear and co-testing recommended in one year. Per patient has no history of an abnormal Pap smear prior to the last two Pap smears. Patient has a history of a hysterectomy 11/28/2000 due to fibroids. Last two Pap smears and hysterectomy results are in Epic.   Physical exam: Breasts Breasts symmetrical. No skin abnormalities bilateral breasts. No nipple retraction bilateral breasts. No nipple discharge bilateral breasts. No lymphadenopathy. No lumps palpated bilateral breasts. No complaints of pain or tenderness on exam.       Pelvic/Bimanual Patient refused Pap smear today. Patient scheduled to have Pap smear completed at the free Pap smear Screening at the [facility] on Monday, November 23, 2019 at 1430.   Smoking History: Patient is a current smoker. Discussed smoking cessation with patient. Referred to the [facility] Quitline and gave resources to the free smoking cessation classes at [facility].   Patient Navigation: Patient education provided. Access to services provided for patient through [place] program.   Colorectal Cancer Screening: Per patient has never had a colonoscopy completed. No complaints today. FIT Test given to patientto complete and send back to PCP.   Breast and Cervical Cancer Risk Assessment: Patient does not have family history of breast cancer, known genetic mutations, or radiation treatment to the chest before age 21. Patient does not have history of cervical dysplasia, immunocompromised, or DES exposure in-utero.   Risk Assessment     Risk Scores      10/01/2019 02/20/2018   Last edited by: Demetrius Revel, LPN [name], LPN   5-year risk: 1.2 % 1.1 %   Lifetime risk: 6.9 % 7.2 %          A: BCCCP exam without pap smear No complaints.  P: Referred patient to the [facility] for a screening mammogram on the mobile unit. Appointment scheduled Thursday, October 01, 2019 at 1510.   [name], RN 10/01/2019 2:12 PM

## 2019-10-01 NOTE — Patient Instructions (Addendum)
Explained breast self awareness with Jackquline Berlin. Patient refused Pap smear today. Patient scheduled to have Pap smear completed at the free Pap smear Screening at the [facility] on Monday, November 23, 2019 at 1430. Referred patient to the [place] [facility] for a screening mammogram on the mobile unit. Appointment scheduled Thursday, October 01, 2019 at 1510. Patient escorted to mobile unit for mammogram following BCCCP appointment. Let patient know the [facility] will follow up with her within the next couple weeks with results of her mammogram by letter or phone. Discussed smoking cessation with patient. Referred to the [facility] Quitline and gave resources to the free smoking cessation classes at [facility]. Jackquline Berlin verbalized understanding.  Mia Winthrop, Arvil [place], RN 1:54 PM

## 2019-10-13 MED FILL — MAGNESIUM OXIDE 400 MG TAB: 400 | 30 days supply | Qty: 30 | Fill #5

## 2019-10-13 MED FILL — METOPROLOL SUCCINATE ER 100: 100 | 30 days supply | Qty: 30 | Fill #1

## 2019-10-13 MED FILL — ?ATORVASTATIN 20 MG TABLET: 20 | 30 days supply | Qty: 30 | Fill #1

## 2019-10-13 MED FILL — AMLODIPINE BESYLATE 10 MG T: 10 | 30 days supply | Qty: 30 | Fill #1

## 2019-11-11 MED FILL — ATORVASTATIN CALCIUM 20 MG: 20 | 30 days supply | Qty: 30 | Fill #2

## 2019-11-11 MED FILL — AMLODIPINE BESYLATE 10 MG T: 10 | 30 days supply | Qty: 30 | Fill #2

## 2019-11-11 MED FILL — METOPROLOL SUCCINATE ER 100: 100 | 30 days supply | Qty: 30 | Fill #2

## 2019-11-13 IMAGING — MG DIGITAL DIAGNOSTIC UNILATERAL LEFT MAMMOGRAM
4 series · 4 of 4 positions shown · non-contrast
Comparison: February 20, 2018 and earlier priors

CLINICAL DATA: 57-year-old patient recalled from recent screening
mammogram for evaluation of possible left breast calcifications.

EXAM:
DIGITAL DIAGNOSTIC LEFT MAMMOGRAM WITH TOMO

[L XCCL]
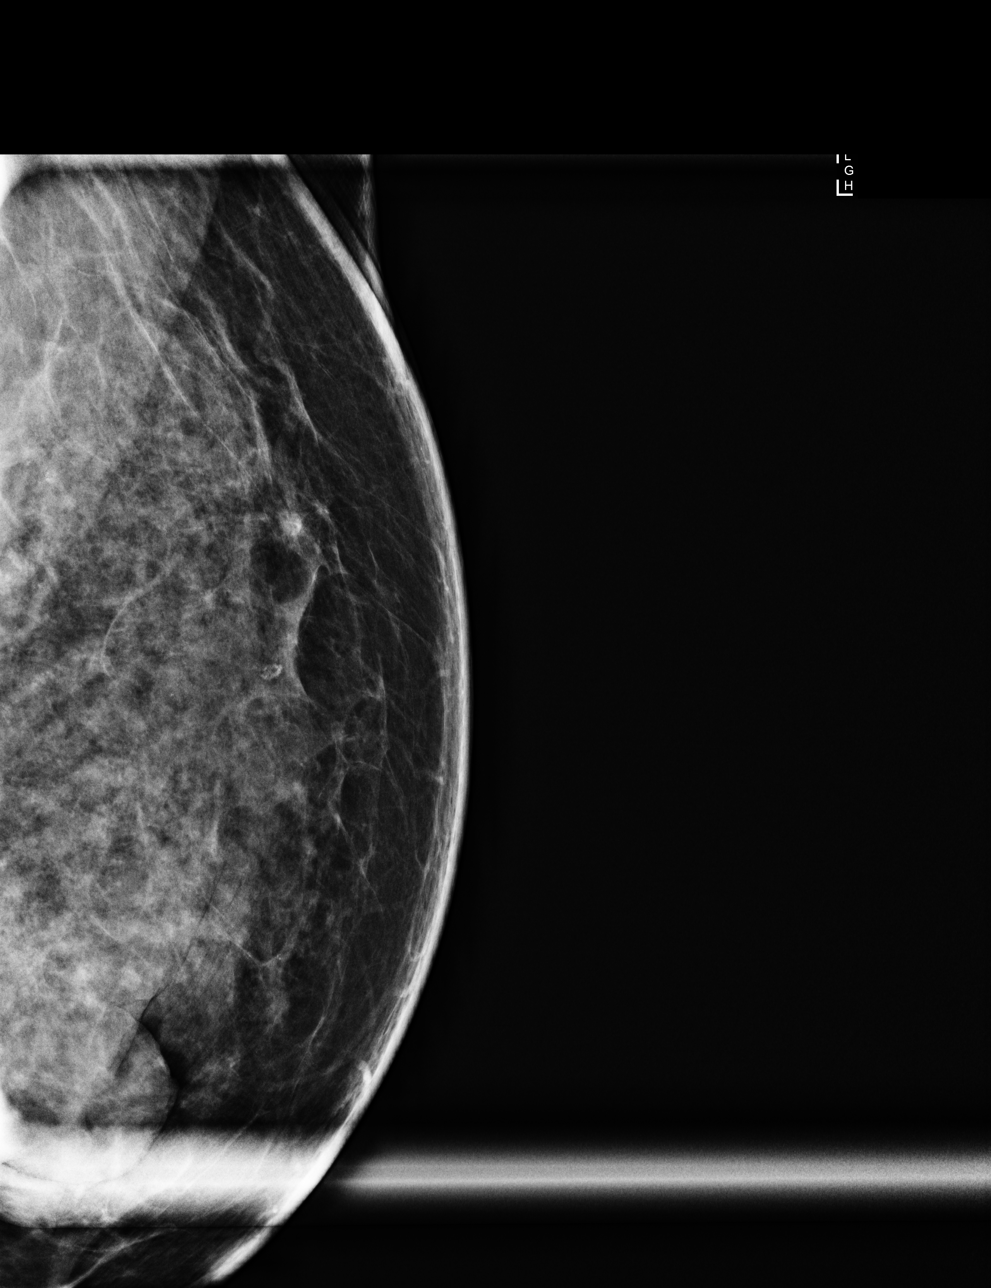

[L ML (1 of 2)]
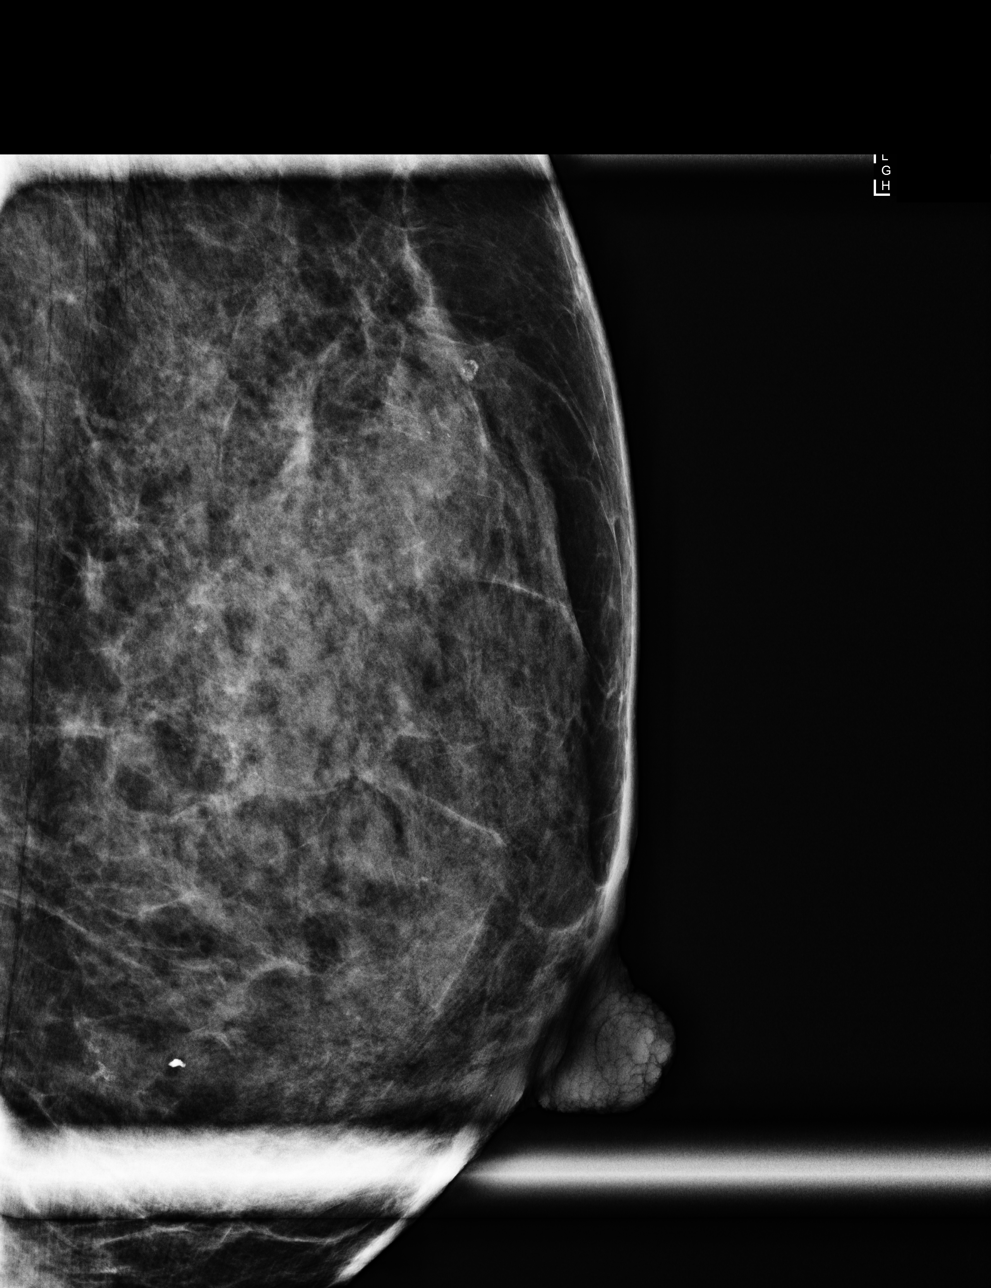

[L ML (2 of 2)]
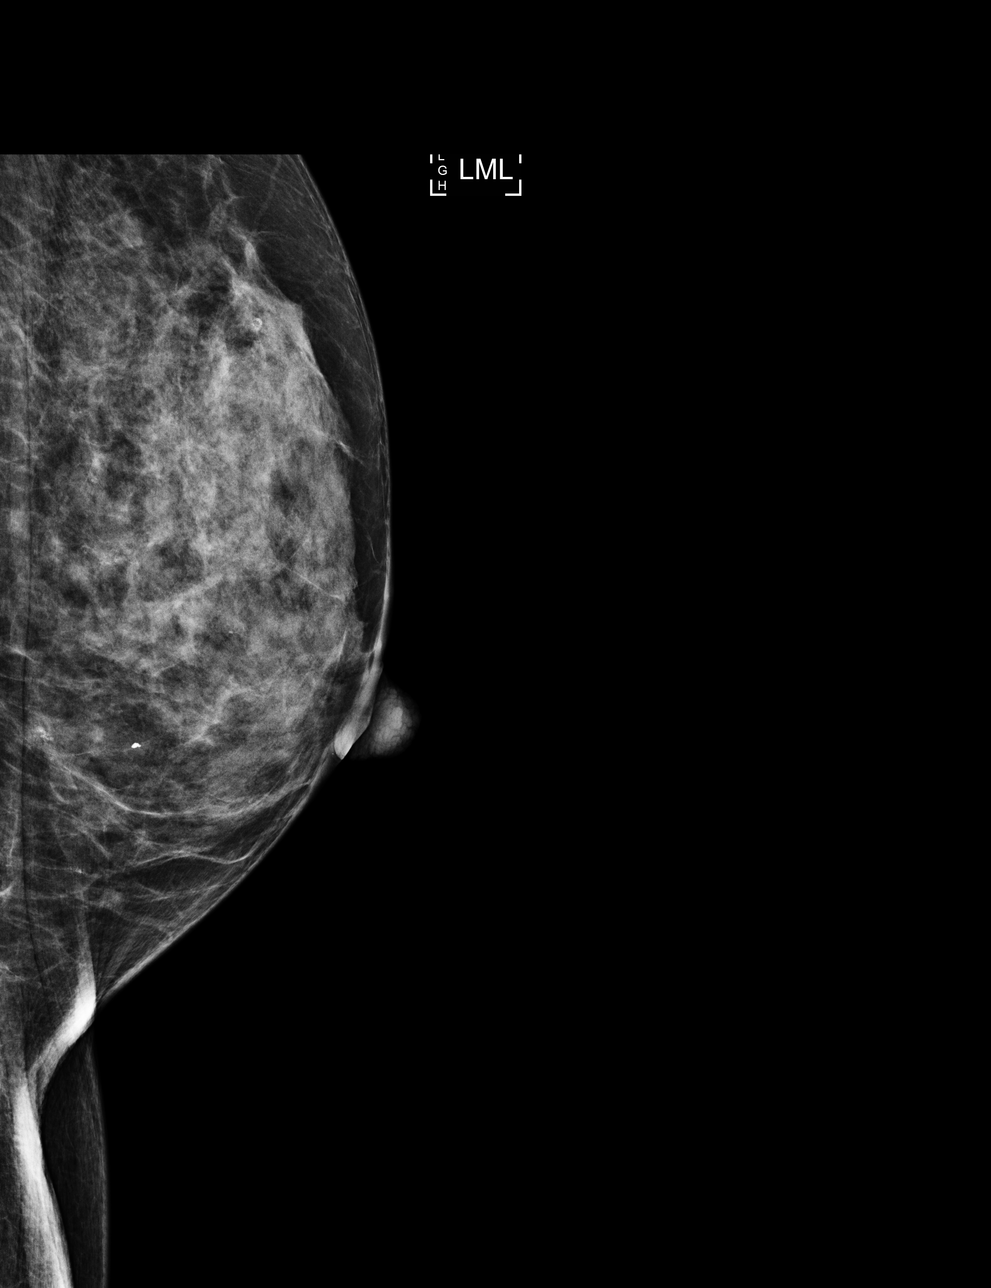

[L CC]
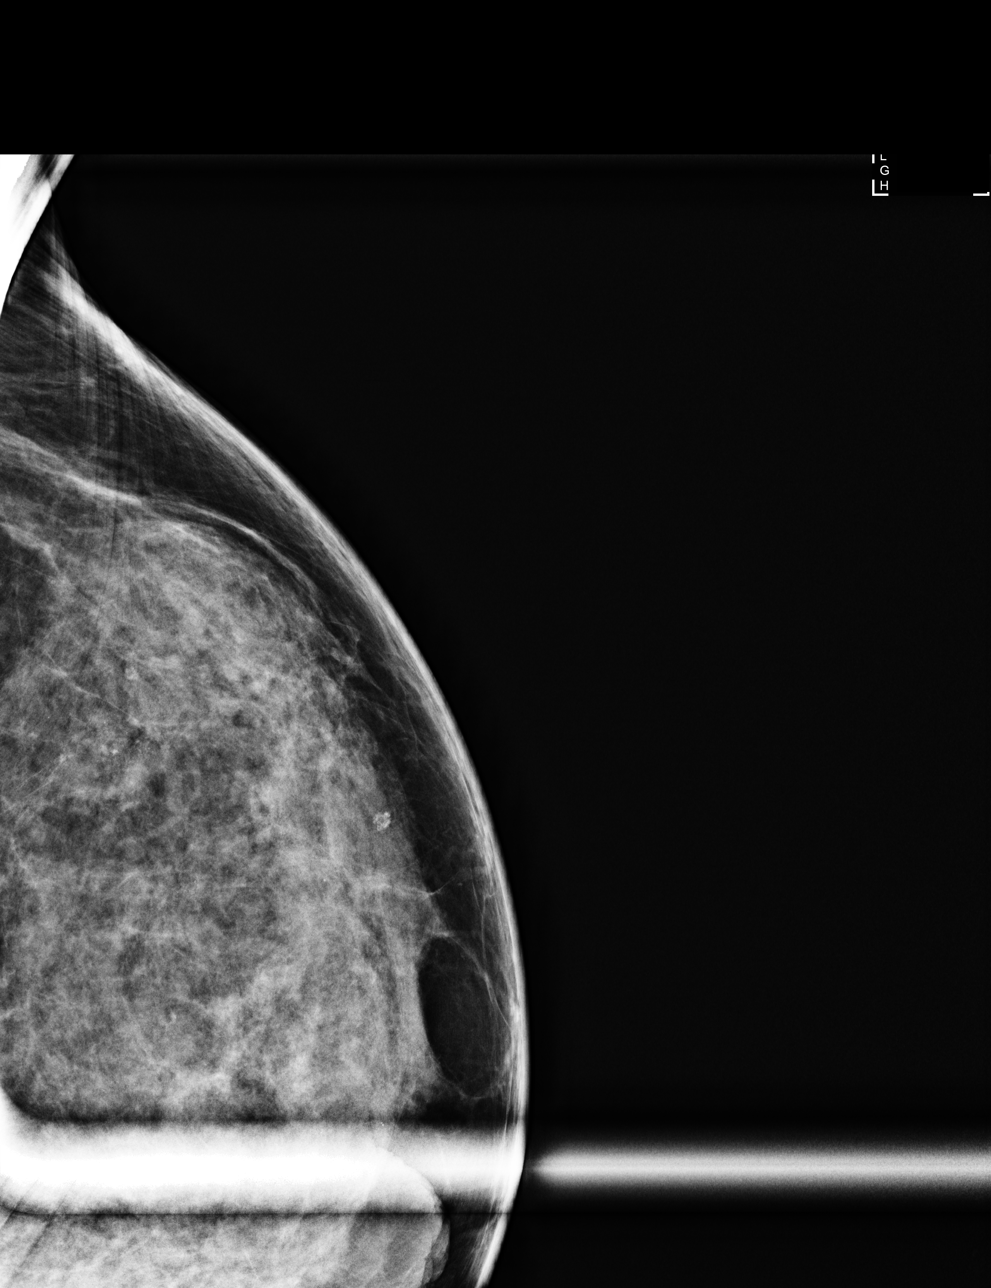

[4 of 4 positions shown; findings below may reference images not displayed]

ACR Breast Density Category c: The breast tissue is heterogeneously
dense, which may obscure small masses.
FINDINGS: Magnification views performed of the left breast show no suspicious
groupings or morphologies of microcalcifications. There is a chronic
coarse calcification in the superficial superior left breast,
unchanged from priors. There are a few scattered punctate
calcifications. No findings to suggest malignancy.
IMPRESSION: No evidence of malignancy in the left breast.

RECOMMENDATION:
Screening mammogram in one year.(Code:[id number])

I have discussed the findings and recommendations with the patient.
Results were also provided in writing at the conclusion of the
visit. If applicable, a reminder letter will be sent to the patient
regarding the next appointment.

BI-RADS CATEGORY  2: Benign.

## 2019-11-23 ENCOUNTER — Ambulatory Visit: Payer: Self-pay

## 2019-12-09 MED FILL — MAGNESIUM OXIDE 400 MG TAB: 400 | 30 days supply | Qty: 30 | Fill #6

## 2019-12-09 MED FILL — AMLODIPINE BESYLATE 10 MG T: 10 | 30 days supply | Qty: 30 | Fill #3

## 2019-12-09 MED FILL — METOPROLOL SUCCINATE ER 100: 100 | 30 days supply | Qty: 30 | Fill #3

## 2019-12-09 MED FILL — ATORVASTATIN CALCIUM 20 MG: 20 | 30 days supply | Qty: 30 | Fill #3

## 2019-12-28 ENCOUNTER — Ambulatory Visit: Payer: No Typology Code available for payment source

## 2019-12-29 ENCOUNTER — Ambulatory Visit: Payer: Self-pay | Attending: Family Medicine

## 2019-12-29 ENCOUNTER — Other Ambulatory Visit: Payer: Self-pay

## 2019-12-29 DIAGNOSIS — Z23 Encounter for immunization: Secondary | ICD-10-CM

## 2020-01-06 MED FILL — METOPROLOL SUCCINATE ER 100: 100 | 30 days supply | Qty: 30 | Fill #4

## 2020-01-06 MED FILL — ?ATORVASTATIN 20 MG TABLET: 20 | 30 days supply | Qty: 30 | Fill #4

## 2020-01-06 MED FILL — AMLODIPINE BESYLATE 10 MG T: 10 | 30 days supply | Qty: 30 | Fill #4

## 2020-01-06 MED FILL — MAGNESIUM OXIDE 400 MG TAB: 400 | 30 days supply | Qty: 30 | Fill #7

## 2020-01-07 ENCOUNTER — Other Ambulatory Visit: Payer: Self-pay

## 2020-01-07 ENCOUNTER — Ambulatory Visit: Payer: Self-pay | Attending: Family Medicine

## 2020-01-11 ENCOUNTER — Ambulatory Visit: Payer: Self-pay

## 2020-01-20 NOTE — Progress Notes (Signed)
Cardiology Office Note Date:  01/21/2020  Patient ID:  Melanie, Harris [birth year], MRN [id number] PCP:  Charlott Rakes, MD  Cardiologist:  Dr. Angelena Form Electrophysiologist: Dr. Lovena Le    Chief Complaint: annual visit  History of Present Illness: Melanie Harris is a 59 y.o. female with history of HTN, HLD, smoking, ectopy, known to have SVT (described as brief runs), PACs, PVCs.  She comes in today to be seen for Dr. Lovena Le, last seen by him Nov 2020, she had a increase in her palpitations at that time, discussed adding flecainide would be an option, I encouraged her to avoid this for now due to the benign nature of her condition  More recently she saw A. Chandler, PA in April 2021, with increased palpitations.  Her ekg that day noted no ectopy, discussed benign nature of her ectopy, discussed treatment options, decided on watchful waiting and symptom surveillance.  TODAY She continues to have palpitations, behavior and frequency about the same. They are brief flutters for the most part that seem to wax/wane, with no real trigger or pattern. Infrequently will have palpitations that feel like fast HR that lasts 10-36min. Neither of them provoke any symptoms, though make her a bit nervous. No CP, no SOB, DOE No dizzy spells, near syncope or syncope. PMD manages/monitors her labs/lipids.   Past Medical History:  Diagnosis Date  . Abscess of right thigh 07/25/2011  . Abscess of thigh    right  . Anxiety state 12/21/2015  . Cough 08/12/2013  . Hypertension   . Low grade squamous intraepithelial lesion (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 Pathology negative 06/2016: LSIL/no hpv testing done  . Other emphysema ([place]) 08/12/2013  . Pure hypercholesterolemia 12/22/2015  . Rash 08/21/2011  . Smoking 08/12/2013  . Xerosis of skin 02/21/2017    Past Surgical History:  Procedure Laterality  Date  . ABDOMINAL HYSTERECTOMY  2004  . TONSILLECTOMY  [age] yrs old    Current Outpatient Medications  Medication Sig Dispense Refill  . amLODipine (NORVASC) 10 MG tablet Take 1 tablet (10 mg total) by mouth daily. 30 tablet 6  . atorvastatin (LIPITOR) 20 MG tablet Take 1 tablet (20 mg total) by mouth daily. 30 tablet 6  . carboxymethylcellulose (REFRESH PLUS) 0.5 % SOLN Place 1 drop into both eyes 3 (three) times daily as needed (dry eyes).    . fluticasone (FLONASE) 50 MCG/ACT nasal spray Place 2 sprays into both nostrils daily. (Patient taking differently: Place 2 sprays into both nostrils as needed.) 16 g 6  . ibuprofen (ADVIL,MOTRIN) 200 MG tablet Take 400 mg by mouth every 6 (six) hours as needed for moderate pain.    . magnesium oxide (MAG-OX) 400 MG tablet Take 1 tablet (400 mg total) by mouth daily. 90 tablet 3  . metoprolol succinate (TOPROL-XL) 100 MG 24 hr tablet Take 1 tablet (100 mg total) by mouth daily. 30 tablet 6  . Multiple Vitamin (MULTIVITAMIN) capsule Take 1 capsule by mouth daily.    . sodium chloride (OCEAN) 0.65 % SOLN nasal spray Place 1 spray into both nostrils as needed for congestion.    . triamcinolone cream (KENALOG) 0.1 % Apply 1 application topically 2 (two) times daily. (Patient taking differently: Apply 1 application topically as needed.) 85.2 g 0  . Vitamin D, Ergocalciferol, 2000 units CAPS Take 2,000 Units by mouth daily.     No current facility-administered  medications for this visit.    Allergies:   Doxycycline   Social History:  The patient  reports that she has been smoking cigarettes. She has a 7.50 pack-year smoking history. She has never used smokeless tobacco. She reports current alcohol use of about 1.0 standard drink of alcohol per week. She reports that she does not use drugs.   Family History:  The patient's family history includes Diabetes in her sister; Dysrhythmia in her sister; Healthy in her daughter; Heart failure in her mother;  Hypercholesterolemia in her father; Hyperlipidemia in her mother; Hypertension in her father and mother; Lung cancer in her father.  ROS:  Please see the history of present illness.    All other systems are reviewed and otherwise negative.   PHYSICAL EXAM:  VS:  BP 138/64   Pulse 74   Ht 5\' 7"  (1.702 m)   Wt 96 lb (43.5 kg)   SpO2 97%   BMI 15.04 kg/m  BMI: Body mass index is 15.04 kg/m. Well nourished, well developed, thin body habitus, in no acute distress HEENT: normocephalic, atraumatic Neck: no JVD, carotid bruits or masses Cardiac:  RRR; no significant murmurs, no rubs, or gallops Lungs:  CTA b/l, no wheezing, rhonchi or rales Abd: soft, nontender MS: no deformity or atrophy Ext: no edema Skin: warm and dry, no rash Neuro:  No gross deficits appreciated Psych: euthymic mood, full affect   EKG:  Not done today  April 2019, 48hr monitor Sinus rhythm Rare premature atrial contractions (70 during monitoring period) Short run of SVT (11 beats) Rare premature ventricular contractions (7 during monitoring period) 6 beat run of ventricular tachycardia.    05/31/2017: TTE Study Conclusions  - Left ventricle: The cavity size was normal. Wall thickness was  normal. Systolic function was normal. The estimated ejection  fraction was in the range of 60% to 65%. Left ventricular  diastolic function parameters were normal.  - Mitral valve: There was mild regurgitation.  - Atrial septum: No defect or patent foramen ovale was identified.    Recent Labs: 05/08/2019: ALT 30; BUN 14; Creatinine, Ser 0.66; Hemoglobin 14.3; Magnesium 1.8; Platelets 269; Potassium 4.1; Sodium 140; TSH 1.160  No results found for requested labs within last 8760 hours.   CrCl cannot be calculated (Patient's most recent lab result is older than the maximum 21 days allowed.).   Wt Readings from Last 3 Encounters:  01/21/20 96 lb (43.5 kg)  10/01/19 102 lb 1.6 oz (46.3 kg)  07/28/19 100 lb 3.2 oz  (45.5 kg)     Other studies reviewed: Additional studies/records reviewed today include: summarized above  ASSESSMENT AND PLAN:  1. Palpitations     PACs, PVCs, short PSVT     Monitor 2019 as above     No change in behavior     Discussed keeping adequately hydrated, exercise, and avoiding stimulants  2. HTN     No changes today  3. HLD     Monitored and managed with he PMD  Disposition: F/u with [place] in a year, sooner if needed  Current medicines are reviewed at length with the patient today.  The patient did not have any concerns regarding medicines.  Venetia Night, PA-C 01/21/2020 3:56 PM     [place]  [phone] (office)  [phone] (fax)

## 2020-01-21 ENCOUNTER — Ambulatory Visit (INDEPENDENT_AMBULATORY_CARE_PROVIDER_SITE_OTHER): Payer: Self-pay | Admitting: Physician Assistant

## 2020-01-21 ENCOUNTER — Encounter: Payer: Self-pay | Admitting: Physician Assistant

## 2020-01-21 ENCOUNTER — Other Ambulatory Visit: Payer: Self-pay

## 2020-01-21 VITALS — BP 138/64 | HR 74 | Ht 67.0 in | Wt 96.0 lb

## 2020-01-21 DIAGNOSIS — I471 Supraventricular tachycardia: Secondary | ICD-10-CM

## 2020-01-21 DIAGNOSIS — R002 Palpitations: Secondary | ICD-10-CM

## 2020-01-21 DIAGNOSIS — I10 Essential (primary) hypertension: Secondary | ICD-10-CM

## 2020-01-21 NOTE — Patient Instructions (Addendum)
Medication Instructions:   Your physician recommends that you continue on your current medications as directed. Please refer to the Current Medication list given to you today.   *If you need a refill on your cardiac medications before your next appointment, please call your pharmacy*   Lab Work: [place]   If you have labs (blood work) drawn today and your tests are completely normal, you will receive your results only by: Marland Kitchen MyChart Message (if you have MyChart) OR . A paper copy in the mail If you have any lab test that is abnormal or we need to change your treatment, we will call you to review the results.   Testing/Procedures: NONE ORDERED  TODAY   Follow-Up: At [facility], you and your health needs are our priority.  As part of our continuing mission to provide you with exceptional heart care, we have created designated Provider Care Teams.  These Care Teams include your primary Cardiologist (physician) and Advanced Practice Providers (APPs -  Physician Assistants and Nurse Practitioners) who all work together to provide you with the care you need, when you need it.  We recommend signing up for the patient portal called "MyChart".  Sign up information is provided on this After Visit Summary.  MyChart is used to connect with patients for Virtual Visits (Telemedicine).  Patients are able to view lab/test results, encounter notes, upcoming appointments, etc.  Non-urgent messages can be sent to your provider as well.   To learn more about what you can do with MyChart, go to [email or web address].    Your next appointment:   1 year(s)  The format for your next appointment:   In Person  Provider:   You may see Dr. Lovena Le   Other Instructions

## 2020-01-22 ENCOUNTER — Ambulatory Visit: Payer: No Typology Code available for payment source | Admitting: Physician Assistant

## 2020-02-10 MED FILL — METOPROLOL SUCCINATE ER 100: 100 | 30 days supply | Qty: 30 | Fill #5

## 2020-02-10 MED FILL — MAGNESIUM OXIDE 400 MG TAB: 400 | 30 days supply | Qty: 30 | Fill #8

## 2020-02-10 MED FILL — AMLODIPINE BESYLATE 10 MG T: 10 | 30 days supply | Qty: 30 | Fill #5

## 2020-02-10 MED FILL — ?ATORVASTATIN 20 MG TABLET: 20 | 30 days supply | Qty: 30 | Fill #5

## 2020-03-09 MED FILL — METOPROLOL SUCCINATE ER 100: 100 | 30 days supply | Qty: 30 | Fill #6

## 2020-03-09 MED FILL — AMLODIPINE BESYLATE 10 MG T: 10 | 30 days supply | Qty: 30 | Fill #6

## 2020-03-09 MED FILL — MAGNESIUM OXIDE 400 MG TAB: 400 | 30 days supply | Qty: 30 | Fill #9

## 2020-03-09 MED FILL — ?ATORVASTATIN 20 MG TABLET: 20 | 30 days supply | Qty: 30 | Fill #6

## 2020-04-04 ENCOUNTER — Ambulatory Visit: Payer: Self-pay

## 2020-04-07 ENCOUNTER — Encounter: Payer: Self-pay | Admitting: Family Medicine

## 2020-04-08 ENCOUNTER — Other Ambulatory Visit: Payer: Self-pay | Admitting: Family Medicine

## 2020-04-08 DIAGNOSIS — E7800 Pure hypercholesterolemia, unspecified: Secondary | ICD-10-CM

## 2020-04-08 DIAGNOSIS — I10 Essential (primary) hypertension: Secondary | ICD-10-CM

## 2020-04-08 MED ORDER — AMLODIPINE BESYLATE 10 MG PO TABS: 10.0000 mg | ORAL_TABLET | Freq: Every day | ORAL | 6 refills | Status: DC

## 2020-04-08 MED ORDER — ATORVASTATIN CALCIUM 20 MG PO TABS: 20.0000 mg | ORAL_TABLET | Freq: Every day | ORAL | 6 refills | Status: DC

## 2020-04-08 MED ORDER — METOPROLOL SUCCINATE ER 100 MG PO TB24: 100.0000 mg | ORAL_TABLET | Freq: Every day | ORAL | 6 refills | Status: DC

## 2020-04-08 MED FILL — AMLODIPINE BESYLATE 10 MG T: 10 | 30 days supply | Qty: 30 | Fill #0

## 2020-04-08 MED FILL — METOPROLOL SUCCINATE ER 100: 100 | 30 days supply | Qty: 30 | Fill #0

## 2020-04-08 MED FILL — ?ATORVASTATIN 20 MG TABLET: 20 | 30 days supply | Qty: 30 | Fill #0

## 2020-05-11 ENCOUNTER — Other Ambulatory Visit: Payer: Self-pay

## 2020-05-11 ENCOUNTER — Other Ambulatory Visit: Payer: Self-pay | Admitting: Student

## 2020-05-11 MED ORDER — MAGNESIUM OXIDE 400 MG PO TABS
ORAL_TABLET | ORAL | 3 refills | Status: DC
  Filled 2020-05-11: qty 30, 30d supply, fill #0
  Filled 2020-06-08: qty 30, 30d supply, fill #1

## 2020-05-11 MED FILL — Atorvastatin Calcium Tab 20 MG (Base Equivalent): ORAL | 30 days supply | Qty: 30 | Fill #0 | Status: CN

## 2020-05-11 MED FILL — Atorvastatin Calcium Tab 20 MG (Base Equivalent): ORAL | 30 days supply | Qty: 30 | Fill #0 | Status: AC

## 2020-05-11 MED FILL — Metoprolol Succinate Tab ER 24HR 100 MG (Tartrate Equiv): ORAL | 30 days supply | Qty: 30 | Fill #0 | Status: AC

## 2020-05-11 MED FILL — Amlodipine Besylate Tab 10 MG (Base Equivalent): ORAL | 30 days supply | Qty: 30 | Fill #0 | Status: AC

## 2020-05-12 ENCOUNTER — Other Ambulatory Visit: Payer: Self-pay

## 2020-05-13 ENCOUNTER — Other Ambulatory Visit: Payer: Self-pay

## 2020-06-02 ENCOUNTER — Ambulatory Visit: Payer: Self-pay | Admitting: Internal Medicine

## 2020-06-08 ENCOUNTER — Other Ambulatory Visit: Payer: Self-pay

## 2020-06-08 MED FILL — Metoprolol Succinate Tab ER 24HR 100 MG (Tartrate Equiv): ORAL | 30 days supply | Qty: 30 | Fill #1 | Status: AC

## 2020-06-08 MED FILL — Atorvastatin Calcium Tab 20 MG (Base Equivalent): ORAL | 30 days supply | Qty: 30 | Fill #1 | Status: AC

## 2020-06-08 MED FILL — Amlodipine Besylate Tab 10 MG (Base Equivalent): ORAL | 30 days supply | Qty: 30 | Fill #1 | Status: AC

## 2020-06-09 ENCOUNTER — Other Ambulatory Visit: Payer: Self-pay

## 2020-06-09 ENCOUNTER — Ambulatory Visit: Payer: Self-pay | Attending: Internal Medicine | Admitting: Physician Assistant

## 2020-06-09 ENCOUNTER — Encounter: Payer: Self-pay | Admitting: Physician Assistant

## 2020-06-09 VITALS — BP 128/67 | HR 89 | Resp 18 | Ht 67.0 in | Wt 95.8 lb

## 2020-06-09 DIAGNOSIS — I10 Essential (primary) hypertension: Secondary | ICD-10-CM

## 2020-06-09 DIAGNOSIS — J3489 Other specified disorders of nose and nasal sinuses: Secondary | ICD-10-CM

## 2020-06-09 DIAGNOSIS — E7800 Pure hypercholesterolemia, unspecified: Secondary | ICD-10-CM

## 2020-06-09 MED ORDER — METOPROLOL SUCCINATE ER 100 MG PO TB24
ORAL_TABLET | Freq: Every day | ORAL | 6 refills | Status: DC
  Filled 2020-06-09: qty 30, fill #0
  Filled 2020-07-11: qty 30, 30d supply, fill #0
  Filled 2020-08-10: qty 30, 30d supply, fill #1
  Filled 2020-09-08: qty 30, 30d supply, fill #2
  Filled 2020-10-05: qty 30, 30d supply, fill #3
  Filled 2020-11-09: qty 30, 30d supply, fill #4
  Filled 2020-12-07: qty 30, 30d supply, fill #5
  Filled 2021-01-05: qty 30, 30d supply, fill #6

## 2020-06-09 MED ORDER — MUPIROCIN CALCIUM 2 % NA OINT
1.0000 "application " | TOPICAL_OINTMENT | Freq: Two times a day (BID) | NASAL | 0 refills | Status: DC
  Filled 2020-06-09: qty 10, 5d supply, fill #0

## 2020-06-09 MED ORDER — ATORVASTATIN CALCIUM 20 MG PO TABS
ORAL_TABLET | Freq: Every day | ORAL | 6 refills | Status: DC
  Filled 2020-06-09: qty 30, fill #0
  Filled 2020-07-11: qty 30, 30d supply, fill #0
  Filled 2020-08-10: qty 30, 30d supply, fill #1
  Filled 2020-09-08: qty 30, 30d supply, fill #2
  Filled 2020-10-05: qty 30, 30d supply, fill #3
  Filled 2020-11-09: qty 30, 30d supply, fill #4
  Filled 2020-12-07: qty 30, 30d supply, fill #5
  Filled 2021-01-05: qty 30, 30d supply, fill #6

## 2020-06-09 MED ORDER — MAGNESIUM OXIDE 400 MG PO TABS
ORAL_TABLET | ORAL | 3 refills | Status: DC
  Filled 2020-06-09: qty 90, fill #0
  Filled 2020-07-11: qty 30, 30d supply, fill #0
  Filled 2020-10-05: qty 30, 30d supply, fill #1
  Filled 2020-12-07: qty 30, 30d supply, fill #2
  Filled 2021-01-05: qty 30, 30d supply, fill #3
  Filled 2021-03-07: qty 30, 30d supply, fill #0
  Filled 2021-03-07: qty 30, 30d supply, fill #4
  Filled 2021-05-09: qty 30, 30d supply, fill #1

## 2020-06-09 MED ORDER — AMLODIPINE BESYLATE 10 MG PO TABS
ORAL_TABLET | Freq: Every day | ORAL | 6 refills | Status: DC
  Filled 2020-06-09: qty 30, fill #0
  Filled 2020-07-11: qty 30, 30d supply, fill #0
  Filled 2020-08-10: qty 30, 30d supply, fill #1
  Filled 2020-09-08: qty 30, 30d supply, fill #2
  Filled 2020-10-05: qty 30, 30d supply, fill #3
  Filled 2020-11-09: qty 30, 30d supply, fill #4
  Filled 2020-12-07: qty 30, 30d supply, fill #5
  Filled 2021-01-05: qty 30, 30d supply, fill #6

## 2020-06-09 NOTE — Progress Notes (Signed)
Melanie Harris, is a 60 y.o. female  ERD:[id number]  HUD:[id number]  DOB - [birth year]  Subjective:  Chief Complaint and HPI: Melanie Harris is a 60 y.o. female here today for med RF.  She has a sore in her L nostril that won't heal.  She uses saline nasal spray daily. No nose bleeds.  She has been having some allergies.  Compliant with her meds.  No CP/Dizziness/HA/vision changes   ROS:   Constitutional:  No f/c, No night sweats, No unexplained weight loss. EENT:  No vision changes, No blurry vision, No hearing changes. No mouth, throat, or ear problems.  Respiratory: No cough, No SOB Cardiac: No CP, no palpitations GI:  No abd pain, No N/V/D. GU: No Urinary s/sx Musculoskeletal: No joint pain Neuro: No headache, no dizziness, no motor weakness.  Skin: No rash Endocrine:  No polydipsia. No polyuria.  Psych: Denies SI/HI  No problems updated.  ALLERGIES: Allergies  Allergen Reactions  . Doxycycline Rash    PAST MEDICAL HISTORY: Past Medical History:  Diagnosis Date  . Abscess of right thigh 07/25/2011  . Abscess of thigh    right  . Anxiety state 12/21/2015  . Cough 08/12/2013  . Hypertension   . Low grade squamous intraepithelial lesion (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 Pathology negative 06/2016: LSIL/no hpv testing done  . Other emphysema ([place]) 08/12/2013  . Pure hypercholesterolemia 12/22/2015  . Rash 08/21/2011  . Smoking 08/12/2013  . Xerosis of skin 02/21/2017    MEDICATIONS AT HOME: Prior to Admission medications   Medication Sig Start Date End Date Taking? Authorizing Provider  carboxymethylcellulose (REFRESH PLUS) 0.5 % SOLN Place 1 drop into both eyes 3 (three) times daily as needed (dry eyes).   Yes [provider]  ibuprofen (ADVIL,MOTRIN) 200 MG tablet Take 400 mg by mouth every 6 (six) hours as needed for moderate pain.   Yes [provider]  Multiple  Vitamin (MULTIVITAMIN) capsule Take 1 capsule by mouth daily.   Yes [provider]  mupirocin nasal ointment (BACTROBAN) 2 % Place 1 application into the nose 2 (two) times daily. Use one-half of tube in each nostril twice daily for five (5) days. After application, press sides of nose together and gently massage. 06/09/20  Yes Freeman Caldron M, PA-C  triamcinolone cream (KENALOG) 0.1 % Apply 1 application topically 2 (two) times daily. Patient taking differently: Apply 1 application topically as needed. 10/02/17  Yes Charlott Rakes, MD  Vitamin D, Ergocalciferol, 2000 units CAPS Take 2,000 Units by mouth daily.   Yes [provider]  amLODipine (NORVASC) 10 MG tablet TAKE 1 TABLET (10 MG TOTAL) BY MOUTH DAILY. 06/09/20 06/09/21  Argentina Donovan, PA-C  atorvastatin (LIPITOR) 20 MG tablet TAKE 1 TABLET (20 MG TOTAL) BY MOUTH DAILY. 06/09/20 06/09/21  Argentina Donovan, PA-C  fluticasone (FLONASE) 50 MCG/ACT nasal spray Place 2 sprays into both nostrils daily. Patient taking differently: Place 2 sprays into both nostrils as needed. 12/25/18   Argentina Donovan, PA-C  magnesium oxide (MAG-OX) 400 MG tablet TAKE 1 TABLET (400 MG TOTAL) BY MOUTH DAILY. 06/09/20 06/09/21  Argentina Donovan, PA-C  metoprolol succinate (TOPROL-XL) 100 MG 24 hr tablet TAKE 1 TABLET (100 MG TOTAL) BY MOUTH DAILY. 06/09/20 06/09/21  Argentina Donovan, PA-C  sodium chloride (OCEAN) 0.65 % SOLN nasal spray Place 1 spray into both nostrils as needed for congestion.  Patient not taking: Reported on 06/09/2020    [provider]     Objective:  EXAM:   Vitals:   06/09/20 1602  BP: 128/67  Pulse: 89  Resp: 18  SpO2: 95%  Weight: 95 lb 12.8 oz (43.5 kg)  Height: 5\' 7"  (1.702 m)    General appearance : A&OX3. NAD. Non-toxic-appearing HEENT: Atraumatic and Normocephalic.  PERRLA. EOM intact.  Neck: supple, no JVD. No cervical lymphadenopathy. No thyromegaly Chest/Lungs:  Breathing-non-labored, Good air entry  bilaterally, breath sounds normal without rales, rhonchi, or wheezing  CVS: S1 S2 regular, no murmurs, gallops, rubs  Extremities: Bilateral Lower Ext shows no edema, both legs are warm to touch with = pulse throughout Neurology:  CN II-XII grossly intact, Non focal.   Psych:  TP linear. J/I WNL. Normal speech. Appropriate eye contact and affect.  Skin:  No Rash  Data Review Lab Results  Component Value Date   HGBA1C 5.3 04/20/2013     Assessment & Plan   1. Essential hypertension Controlled;  continue - metoprolol succinate (TOPROL-XL) 100 MG 24 hr tablet; TAKE 1 TABLET (100 MG TOTAL) BY MOUTH DAILY.  Dispense: 30 tablet; Refill: 6 - amLODipine (NORVASC) 10 MG tablet; TAKE 1 TABLET (10 MG TOTAL) BY MOUTH DAILY.  Dispense: 30 tablet; Refill: 6 - Comprehensive metabolic panel - Lipid panel - CBC with Differential/Platelet  2. Pure hypercholesterolemia - atorvastatin (LIPITOR) 20 MG tablet; TAKE 1 TABLET (20 MG TOTAL) BY MOUTH DAILY.  Dispense: 30 tablet; Refill: 6 - Lipid panel  3. Sore in nose - mupirocin nasal ointment (BACTROBAN) 2 %; Place 1 application into the nose 2 (two) times daily. Use one-half of tube in each nostril twice daily for five (5) days. After application, press sides of nose together and gently massage.  Dispense: 10 g; Refill: 0   Patient have been counseled extensively about nutrition and exercise  Return in about 6 months (around 12/10/2020) for with PCP.  The patient was given clear instructions to go to ER or return to medical center if symptoms don't improve, worsen or new problems develop. The patient verbalized understanding. The patient was told to call to get lab results if they haven't heard anything in the next week.     Freeman Caldron, PA-C [facility] and [employer], [place]   06/09/2020, 4:23 PMPatient ID: Melanie Harris, female   DOB: 1960/11/26, 60 y.o.   MRN: [id number]

## 2020-06-10 ENCOUNTER — Other Ambulatory Visit: Payer: Self-pay

## 2020-06-10 ENCOUNTER — Other Ambulatory Visit: Payer: Self-pay | Admitting: Pharmacist

## 2020-06-10 LAB — CBC WITH DIFFERENTIAL/PLATELET
Basophils Absolute: 0.1 10*3/uL (ref 0.0–0.2)
Basos: 1 %
EOS (ABSOLUTE): 0.1 10*3/uL (ref 0.0–0.4)
Eos: 1 %
Hematocrit: 37.1 % (ref 34.0–46.6)
Hemoglobin: 12.5 g/dL (ref 11.1–15.9)
Immature Grans (Abs): 0 10*3/uL (ref 0.0–0.1)
Immature Granulocytes: 0 %
Lymphocytes Absolute: 2.3 10*3/uL (ref 0.7–3.1)
Lymphs: 32 %
MCH: 32.6 pg (ref 26.6–33.0)
MCHC: 33.7 g/dL (ref 31.5–35.7)
MCV: 97 fL (ref 79–97)
Monocytes Absolute: 0.8 10*3/uL (ref 0.1–0.9)
Monocytes: 11 %
Neutrophils Absolute: 3.9 10*3/uL (ref 1.4–7.0)
Neutrophils: 55 %
Platelets: 256 10*3/uL (ref 150–450)
RBC: 3.84 x10E6/uL (ref 3.77–5.28)
RDW: 12.3 % (ref 11.7–15.4)
WBC: 7.2 10*3/uL (ref 3.4–10.8)

## 2020-06-10 LAB — COMPREHENSIVE METABOLIC PANEL
ALT: 30 IU/L (ref 0–32)
AST: 27 IU/L (ref 0–40)
Albumin/Globulin Ratio: 2.4 — ABNORMAL HIGH (ref 1.2–2.2)
Albumin: 5 g/dL — ABNORMAL HIGH (ref 3.8–4.9)
Alkaline Phosphatase: 99 IU/L (ref 44–121)
BUN/Creatinine Ratio: 30 — ABNORMAL HIGH (ref 9–23)
BUN: 20 mg/dL (ref 6–24)
Bilirubin Total: 0.4 mg/dL (ref 0.0–1.2)
CO2: 22 mmol/L (ref 20–29)
Calcium: 9.7 mg/dL (ref 8.7–10.2)
Chloride: 98 mmol/L (ref 96–106)
Creatinine, Ser: 0.67 mg/dL (ref 0.57–1.00)
Globulin, Total: 2.1 g/dL (ref 1.5–4.5)
Glucose: 94 mg/dL (ref 65–99)
Potassium: 3.9 mmol/L (ref 3.5–5.2)
Sodium: 137 mmol/L (ref 134–144)
Total Protein: 7.1 g/dL (ref 6.0–8.5)
eGFR: 101 mL/min/{1.73_m2} (ref >59)

## 2020-06-10 LAB — LIPID PANEL
Chol/HDL Ratio: 1.8 ratio (ref 0.0–4.4)
Cholesterol, Total: 177 mg/dL (ref 100–199)
HDL: 98 mg/dL (ref >39)
LDL Chol Calc (NIH): 61 mg/dL (ref 0–99)
Triglycerides: 101 mg/dL (ref 0–149)
VLDL Cholesterol Cal: 18 mg/dL (ref 5–40)

## 2020-06-10 MED ORDER — MUPIROCIN 2 % EX OINT
TOPICAL_OINTMENT | CUTANEOUS | 0 refills | Status: DC
  Filled 2020-06-10: qty 22, 10d supply, fill #0

## 2020-07-11 ENCOUNTER — Other Ambulatory Visit: Payer: Self-pay

## 2020-07-13 ENCOUNTER — Other Ambulatory Visit: Payer: Self-pay

## 2020-07-26 ENCOUNTER — Ambulatory Visit: Payer: Self-pay

## 2020-07-26 NOTE — Telephone Encounter (Signed)
Answer Assessment - Initial Assessment Questions 1. ONSET: "When did the pain start?"      1 week ago 2. LOCATION: "Where is the pain located?"      Right 3. PAIN: "How bad is the pain?"    (Scale 1-10; or mild, moderate, severe)   -  MILD (1-3): doesn't interfere with normal activities    -  MODERATE (4-7): interferes with normal activities (e.g., work or school) or awakens from sleep, limping    -  SEVERE (8-10): excruciating pain, unable to do any normal activities, unable to walk     10 4. WORK OR EXERCISE: "Has there been any recent work or exercise that involved this part of the body?"      No 5. CAUSE: "What do you think is causing the leg pain?"     Unsure 6. OTHER SYMPTOMS: "Do you have any other symptoms?" (e.g., chest pain, back pain, breathing difficulty, swelling, rash, fever, numbness, weakness)     No 7. PREGNANCY: "Is there any chance you are pregnant?" "When was your last menstrual period?"     No  Protocols used: Leg Pain-A-AH

## 2020-07-26 NOTE — Telephone Encounter (Signed)
Pt. Reports right leg started hurting 1 week ago. Pain goes from foot up to buttock. No redness or swelling. Can't sleep at night because of pain. Tylenol helps "for maybe 30 minutes." No known injury.Given mobile unit location. Would prefer to be seen in office. Will see any provider. Please advise.

## 2020-07-27 ENCOUNTER — Telehealth: Payer: Self-pay | Admitting: Family Medicine

## 2020-07-27 NOTE — Telephone Encounter (Signed)
Copied from [facility] [id number]. Topic: General - Other >> Jul 27, 2020 11:04 AM Tessa Lerner A wrote: Reason for CRM: Patient has made an additional call to the practice requesting to speak with a member of clinical staff regarding discomfort in their legs  Please contact to advise when possible

## 2020-07-27 NOTE — Telephone Encounter (Signed)
Please schedule patient with any available provider, offer her another clinic if possible.

## 2020-07-28 ENCOUNTER — Other Ambulatory Visit: Payer: Self-pay

## 2020-07-28 ENCOUNTER — Ambulatory Visit: Payer: Self-pay | Attending: Physician Assistant | Admitting: Physician Assistant

## 2020-07-28 ENCOUNTER — Encounter: Payer: Self-pay | Admitting: Physician Assistant

## 2020-07-28 VITALS — BP 121/64 | HR 74 | Ht 67.0 in | Wt 91.8 lb

## 2020-07-28 DIAGNOSIS — M5431 Sciatica, right side: Secondary | ICD-10-CM

## 2020-07-28 MED ORDER — GABAPENTIN 300 MG PO CAPS
300.0000 mg | ORAL_CAPSULE | Freq: Every day | ORAL | 0 refills | Status: DC
Start: 2020-07-28 — End: 2021-01-27
  Filled 2020-07-28: qty 30, 30d supply, fill #0

## 2020-07-28 MED ORDER — METHOCARBAMOL 500 MG PO TABS
ORAL_TABLET | ORAL | 0 refills | Status: DC
  Filled 2020-07-28: qty 90, 15d supply, fill #0

## 2020-07-28 MED ORDER — PREDNISONE 10 MG PO TABS
ORAL_TABLET | ORAL | 0 refills | Status: DC
  Filled 2020-07-28: qty 21, 6d supply, fill #0

## 2020-07-28 NOTE — Patient Instructions (Signed)

## 2020-07-28 NOTE — Telephone Encounter (Signed)
Patient has been scheduled

## 2020-07-28 NOTE — Progress Notes (Signed)
Melanie Harris, is a 60 y.o. female  TIW:[id number]  SNK:[id number]  DOB - 09/04/1960  Subjective:  Chief Complaint and HPI: Melanie Harris is a 59 y.o. female here today tfor R low back pain from SI joint down the back of her leg in the thigh, side of her lower leg into her lateral toes.  There is tingling.  No weakness.  This started after walking her friend's dog for a few days a couple of weeks ago.  Pain is worse with movement.  Relieved for a short time with advil.  No UTI s/sx.  She has tolerated prednisone in the past.  No diabetes on labs 1 month ago.   ROS:   Constitutional:  No f/c, No night sweats, No unexplained weight loss. EENT:  No vision changes, No blurry vision, No hearing changes. No mouth, throat, or ear problems.  Respiratory: No cough, No SOB Cardiac: No CP, no palpitations GI:  No abd pain, No N/V/D. GU: No Urinary s/sx Musculoskeletal: see above Neuro: No headache, no dizziness, no motor weakness.  Skin: No rash Endocrine:  No polydipsia. No polyuria.  Psych: Denies SI/HI  No problems updated.  ALLERGIES: Allergies  Allergen Reactions   Doxycycline Rash    PAST MEDICAL HISTORY: Past Medical History:  Diagnosis Date   Abscess of right thigh 07/25/2011   Abscess of thigh    right   Anxiety state 12/21/2015   Cough 08/12/2013   Hypertension    Low grade squamous intraepithelial lesion (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 Pathology negative 06/2016: LSIL/no hpv testing done   Other emphysema ([place]) 08/12/2013   Pure hypercholesterolemia 12/22/2015   Rash 08/21/2011   Smoking 08/12/2013   Xerosis of skin 02/21/2017    MEDICATIONS AT HOME: Prior to Admission medications   Medication Sig Start Date End Date Taking? Authorizing Provider  amLODipine (NORVASC) 10 MG tablet TAKE 1 TABLET (10 MG TOTAL) BY MOUTH DAILY. 06/09/20 06/09/21 Yes Irmalee Riemenschneider, Dionne Bucy, PA-C  atorvastatin  (LIPITOR) 20 MG tablet TAKE 1 TABLET (20 MG TOTAL) BY MOUTH DAILY. 06/09/20 06/09/21 Yes Veta Dambrosia, Dionne Bucy, PA-C  carboxymethylcellulose (REFRESH PLUS) 0.5 % SOLN Place 1 drop into both eyes 3 (three) times daily as needed (dry eyes).   Yes [provider]  gabapentin (NEURONTIN) 300 MG capsule Take 1 capsule (300 mg total) by mouth at bedtime. Prn bedtime 07/28/20  Yes Lemarcus Baggerly M, PA-C  ibuprofen (ADVIL,MOTRIN) 200 MG tablet Take 400 mg by mouth every 6 (six) hours as needed for moderate pain.   Yes [provider]  magnesium oxide (MAG-OX) 400 MG tablet TAKE 1 TABLET (400 MG TOTAL) BY MOUTH DAILY. 06/09/20 06/09/21 Yes Noelle Sease, Dionne Bucy, PA-C  methocarbamol (ROBAXIN) 500 MG tablet 2 tabs 3 times daily for 7 days then you make take prn 07/28/20  Yes Argentina Donovan, PA-C  metoprolol succinate (TOPROL-XL) 100 MG 24 hr tablet TAKE 1 TABLET (100 MG TOTAL) BY MOUTH DAILY. 06/09/20 06/09/21 Yes Argentina Donovan, PA-C  Multiple Vitamin (MULTIVITAMIN) capsule Take 1 capsule by mouth daily.   Yes [provider]  mupirocin ointment (BACTROBAN) 2 % Place 1 application into the nose 2 (two) times daily. Use one-half of tube in each nostril twice daily for five (5) days. After application, press sides of nose together and gently massage. 06/10/20  Yes Charlott Rakes, MD  predniSONE (DELTASONE) 10 MG tablet 6,5,4,3,2,1 take  each day's dose in am with food 07/28/20  Yes Camdyn Laden M, PA-C  Vitamin D, Ergocalciferol, 2000 units CAPS Take 2,000 Units by mouth daily.   Yes [provider]  fluticasone (FLONASE) 50 MCG/ACT nasal spray Place 2 sprays into both nostrils daily. Patient not taking: Reported on 07/28/2020 12/25/18   Argentina Donovan, PA-C  sodium chloride (OCEAN) 0.65 % SOLN nasal spray Place 1 spray into both nostrils as needed for congestion. Patient not taking: No sig reported    [provider]  triamcinolone cream (KENALOG) 0.1 % Apply 1 application  topically 2 (two) times daily. Patient not taking: Reported on 07/28/2020 10/02/17   Charlott Rakes, MD  mupirocin nasal ointment (BACTROBAN) 2 % Place 1 application into the nose 2 (two) times daily. Use one-half of tube in each nostril twice daily for five (5) days. After application, press sides of nose together and gently massage. 06/09/20 06/10/20  Argentina Donovan, PA-C     Objective:  EXAM:   Vitals:   07/28/20 1000  BP: 121/64  Pulse: 74  SpO2: 97%  Weight: 91 lb 12.8 oz (41.6 kg)  Height: 5\' 7"  (1.702 m)    General appearance : A&OX3. NAD. Non-toxic-appearing HEENT: Atraumatic and Normocephalic.  PERRLA. EOM intact.  Chest/Lungs:  Breathing-non-labored, Good air entry bilaterally, breath sounds normal without rales, rhonchi, or wheezing  CVS: S1 S2 regular, no murmurs, gallops, rubs  Back:  ROM ~80% of normal.  There is paraspinus muscle spasm and TTP in the SI joint on the R.  B DTR = intact. Extremities: Bilateral Lower Ext shows no edema, both legs are warm to touch with = pulse throughout Neurology:  CN II-XII grossly intact, Non focal.   Psych:  TP linear. J/I WNL. Normal speech. Appropriate eye contact and affect.  Skin:  No Rash  Data Review Lab Results  Component Value Date   HGBA1C 5.3 04/20/2013     Assessment & Plan   1. Sciatica of right side No rad flags - gabapentin (NEURONTIN) 300 MG capsule; Take 1 capsule (300 mg total) by mouth at bedtime. Prn bedtime  Dispense: 30 capsule; Refill: 0 - methocarbamol (ROBAXIN) 500 MG tablet; 2 tabs 3 times daily for 7 days then you make take prn  Dispense: 90 tablet; Refill: 0 - predniSONE (DELTASONE) 10 MG tablet; 6,5,4,3,2,1 take each day's dose in am with food  Dispense: 21 tablet; Refill: 0 - Ambulatory referral to Orthopedic Surgery-she will cancel appt if this gets well after this treatment.      Patient have been counseled extensively about nutrition and exercise  Return for keep next appt as  scheduled.  The patient was given clear instructions to go to ER or return to medical center if symptoms don't improve, worsen or new problems develop. The patient verbalized understanding. The patient was told to call to get lab results if they haven't heard anything in the next week.     Freeman Caldron, PA-C [facility] and [place], [place]   07/28/2020, 10:18 AM Patient ID: Alishia Lebo, female   DOB: 1961-01-09, 60 y.o.   MRN: [id number]

## 2020-08-10 ENCOUNTER — Other Ambulatory Visit: Payer: Self-pay

## 2020-08-12 ENCOUNTER — Encounter: Payer: Self-pay | Admitting: Family Medicine

## 2020-08-12 ENCOUNTER — Ambulatory Visit (INDEPENDENT_AMBULATORY_CARE_PROVIDER_SITE_OTHER): Payer: Self-pay

## 2020-08-12 ENCOUNTER — Other Ambulatory Visit: Payer: Self-pay

## 2020-08-12 ENCOUNTER — Ambulatory Visit (INDEPENDENT_AMBULATORY_CARE_PROVIDER_SITE_OTHER): Payer: Self-pay | Admitting: Family Medicine

## 2020-08-12 DIAGNOSIS — M5441 Lumbago with sciatica, right side: Secondary | ICD-10-CM

## 2020-08-12 MED ORDER — PREDNISONE 10 MG PO TABS
ORAL_TABLET | ORAL | 0 refills | Status: DC
  Filled 2020-08-12: qty 42, 12d supply, fill #0

## 2020-08-12 NOTE — Progress Notes (Signed)
Office Visit Note   Patient: Melanie Harris           Date of Birth: [birth year]           MRN: [id number] Visit Date: 08/12/2020 Requested by: Argentina Donovan, PA-C [place],  [place] [id number] PCP: Charlott Rakes, MD  Subjective: Chief Complaint  Patient presents with   Lower Back - Pain    Pain starting in her little toe and up the back of her leg to her right lower back x 1 month. Started after walking a friend's dog, that pulls/jerks. No pain on the left side of the back on left leg. Occasional numbness/tingling radiating the same as the pain. Took 7 days of prednisone -- this helped, baclofen and gabapentin at night.     HPI: She is here with right-sided low back and leg pain.  Symptoms started a month ago.  Around that time she was moving and doing a lot of lifting and stair climbing, then she also walked her friend's dog and it was pulling a lot.  There was never 1 moment of injury but she started having pain around that time with numbness and tingling in the lateral right foot and pain radiating up the leg to the back of the hip.  She cannot seem to find a comfortable position especially when sleeping.  No previous problems with her back.  She went to her PCP who gave her prednisone which helped, and muscle relaxants which have not helped.  Denies bowel or bladder dysfunction, denies fevers or chills.  She does smoke cigarettes.  She has a history of emphysema.  She has a history of SVT.              ROS:   All other systems were reviewed and are negative.  Objective: Vital Signs: There were no vitals taken for this visit.  Physical Exam:  General:  Alert and oriented, in no acute distress. Pulm:  Breathing unlabored. Psy:  Normal mood, congruent affect. Skin: No visible rash Right leg: She has tenderness in the right sciatic notch.  No significant lumbar spine tenderness today, no tenderness over the SI joint.  No pain with internal/external hip  rotation.  Straight leg raise negative.  Lower extremity strength and reflexes are normal.  She has 2+ dorsalis pedis and posterior tibial pulses in both legs with no leg edema.    Imaging: XR Lumbar Spine 2-3 Views  Result Date: 08/12/2020 X-rays lumbar spine reveal slight osteopenic appearance of her bones.  There is disc space narrowing at the L5-S1 level.  She has significant calcification within the abdominal aorta suggesting atherosclerosis.  No sign of neoplasm or compression fracture.   Assessment & Plan: Right-sided sciatica, suspicious for lumbar disc protrusion. -McKenzie exercises at home and in physical therapy.  Another round of prednisone.  MRI scan if she fails to improve.  2.  Probable atherosclerosis of the abdominal aorta -I will notify her cardiologist.  Encouraged her to quit smoking and to exercise regularly, and minimize intake of processed carbohydrates and sweets.     Procedures: No procedures performed        PMFS History: Patient Active Problem List   Diagnosis Date Noted   SVT (supraventricular tachycardia) ([place]) 07/24/2017   Palpitations 05/26/2017   Xerosis of skin 02/21/2017   Low grade squamous intraepithelial lesion (LGSIL) on cervical Pap smear 07/03/2016   Pure hypercholesterolemia 12/22/2015   Anxiety state 12/21/2015   Other  emphysema ([place]) 08/12/2013   Cough 08/12/2013   Smoking 08/12/2013   Essential hypertension 08/12/2013   Rash 08/21/2011   Abscess of thigh    Abscess of right thigh 07/25/2011   Past Medical History:  Diagnosis Date   Abscess of right thigh 07/25/2011   Abscess of thigh    right   Anxiety state 12/21/2015   Cough 08/12/2013   Hypertension    Low grade squamous intraepithelial lesion (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 Pathology negative 06/2016: LSIL/no hpv testing done   Other emphysema ([place]) 08/12/2013   Pure hypercholesterolemia  12/22/2015   Rash 08/21/2011   Smoking 08/12/2013   Xerosis of skin 02/21/2017    Family History  Problem Relation Age of Onset   Heart failure Mother        started in her 57's   Hypertension Mother    Hyperlipidemia Mother    Lung cancer Father    Hypertension Father    Hypercholesterolemia Father    Diabetes Sister        diet controlled   Dysrhythmia Sister        possible in the past   Healthy Daughter     Past Surgical History:  Procedure Laterality Date   ABDOMINAL HYSTERECTOMY  2004   TONSILLECTOMY  10-11 yrs old   Social History   Occupational History   Not on file  Tobacco Use   Smoking status: Every Day    Packs/day: 0.25    Years: 30.00    Pack years: 7.50    Types: Cigarettes    Last attempt to quit: 05/15/2018    Years since quitting: 2.2   Smokeless tobacco: Never   Tobacco comments:    1-3 cigs daily  Vaping Use   Vaping Use: Never used  Substance and Sexual Activity   Alcohol use: Yes    Alcohol/week: 1.0 standard drink    Types: 1 Glasses of wine per week    Comment: occassional   Drug use: No   Sexual activity: Not Currently

## 2020-08-15 ENCOUNTER — Other Ambulatory Visit: Payer: Self-pay

## 2020-08-18 ENCOUNTER — Telehealth: Payer: Self-pay | Admitting: Internal Medicine

## 2020-08-18 NOTE — Telephone Encounter (Signed)
Returned call to Pt.  Advised to continue statin, monitor cholesterol, improve diet and quit smoking to prevent progression of plaque found incidentally on xray.  Pt indicates understanding.

## 2020-08-18 NOTE — Telephone Encounter (Signed)
Patient states she had an x-ray and was told it showed she has plaque on her artery. She states her orthopeadic docotor said he would send it to the office for Dr. Lovena Le to see. She would like Dr. Lovena Le to review the results and let her know if she needs to be seen.

## 2020-08-22 ENCOUNTER — Ambulatory Visit: Payer: Self-pay

## 2020-08-23 ENCOUNTER — Ambulatory Visit: Payer: Self-pay | Admitting: Physical Therapy

## 2020-08-29 ENCOUNTER — Other Ambulatory Visit: Payer: Self-pay

## 2020-08-29 ENCOUNTER — Ambulatory Visit: Payer: Self-pay | Attending: Family Medicine

## 2020-09-08 ENCOUNTER — Other Ambulatory Visit: Payer: Self-pay

## 2020-09-09 ENCOUNTER — Other Ambulatory Visit: Payer: Self-pay

## 2020-09-12 ENCOUNTER — Encounter: Payer: Self-pay | Admitting: Physical Therapy

## 2020-09-12 ENCOUNTER — Ambulatory Visit (INDEPENDENT_AMBULATORY_CARE_PROVIDER_SITE_OTHER): Payer: Self-pay | Admitting: Physical Therapy

## 2020-09-12 ENCOUNTER — Other Ambulatory Visit: Payer: Self-pay

## 2020-09-12 DIAGNOSIS — R262 Difficulty in walking, not elsewhere classified: Secondary | ICD-10-CM

## 2020-09-12 DIAGNOSIS — M5441 Lumbago with sciatica, right side: Secondary | ICD-10-CM

## 2020-09-12 DIAGNOSIS — M6281 Muscle weakness (generalized): Secondary | ICD-10-CM

## 2020-09-12 NOTE — Therapy (Signed)
[facility] [address] [place], [place], [address] Phone: [phone]   Fax:  [phone]  Physical Therapy Evaluation  Patient Details  Name: Melanie Harris MRN: [id number] Date of Birth: 05-May-1960 Referring Provider (PT): Eunice Blase, MD   Encounter Date: 09/12/2020   PT End of Session - 09/12/20 1533     Visit Number 1    Number of Visits 8    Date for PT Re-Evaluation 11/11/20    PT Start Time [id number]    PT Stop Time 1555    PT Time Calculation (min) 40 min    Activity Tolerance Patient tolerated treatment well    Behavior During Therapy [facility] for tasks assessed/performed             Past Medical History:  Diagnosis Date   Abscess of right thigh 07/25/2011   Abscess of thigh    right   Anxiety state 12/21/2015   Cough 08/12/2013   Hypertension    Low grade 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 Pathology negative 06/2016: LSIL/no hpv testing done   Other emphysema ([place]) 08/12/2013   Pure hypercholesterolemia 12/22/2015   Rash 08/21/2011   Smoking 08/12/2013   Xerosis of skin 02/21/2017    Past Surgical History:  Procedure Laterality Date   ABDOMINAL HYSTERECTOMY  2004   TONSILLECTOMY  [age] yrs old    There were no vitals filed for this visit.    Subjective Assessment - 09/12/20 1523     Subjective Pt arriving to therapy reporting 3/10 low back pain which radiates down right LE into her right foot. Pt stating this began around May 2022. Pt stating at it's worse her pain has been 10/10, but since predisone it has improved.    Pertinent History smoker, HTN, SVT tachycardia, anxiety    How long can you walk comfortably? 15-20 minutes    Diagnostic tests X-ray    Patient Stated Goals get back to where I use to be, get back to walking more    Currently in Pain? Yes    Pain Score 3     Pain Location Back     Pain Orientation Right;Lower    Pain Descriptors / Indicators Aching;Sore    Pain Type Acute pain    Pain Onset More than a month ago    Pain Frequency Intermittent    Aggravating Factors  walking long distances, prolonged standing    Pain Relieving Factors change positions, stretching, muscle relaxors    Effect of Pain on Daily Activities difficulty with house hold chores                [facility] PT Assessment - 09/12/20 0001       Assessment   Medical Diagnosis M54.41 acute right sided low back pain with right sided sciatica    Referring Provider (PT) Eunice Blase, MD    Onset Date/Surgical Date --   May 2022   Hand Dominance Right    Prior Therapy no      Precautions   Precautions None      Restrictions   Weight Bearing Restrictions No      Balance Screen   Has the patient fallen in the past 6 months No    Is the patient reluctant to leave their home because of a fear of falling?  No      Home Environment   Living  Environment Private residence    Living Arrangements Alone   with dog   Type of [place] to enter    Entrance Stairs-Number of Steps 3    Entrance Stairs-Rails None      Prior Function   Level of Independence Independent    Vocation Unemployed    Leisure walking dog      Cognition   Overall Cognitive Status Within Functional Limits for tasks assessed      Observation/Other Assessments   Focus on Therapeutic Outcomes (FOTO)  63% (predicted 70%)      Posture/Postural Control   Posture/Postural Control Postural limitations    Postural Limitations Forward head;Decreased lumbar lordosis      ROM / Strength   AROM / PROM / Strength AROM;Strength      AROM   AROM Assessment Site Lumbar    Lumbar Flexion 70    Lumbar Extension 20    Lumbar - Right Side Bend 35    Lumbar - Left Side Bend 38    Lumbar - Right Rotation WNL    Lumbar - Left Rotation WNL      Strength   Strength Assessment Site Hip;Knee    Right/Left Hip  Right;Left    Right Hip Flexion 5/5    Right Hip Extension 4/5    Right Hip ABduction 4/5    Right Hip ADduction 4/5    Left Hip Flexion 5/5    Left Hip Extension 4/5    Left Hip ABduction 4/5    Left Hip ADduction 4/5    Right/Left Knee Right;Left    Right Knee Flexion 5/5    Right Knee Extension 5/5    Left Knee Flexion 5/5    Left Knee Extension 5/5      Flexibility   Soft Tissue Assessment /Muscle Length yes    Hamstrings Right: 60 degrees, Left: 70 degrees   pain noted on right     Palpation   Palpation comment tightnesss noted in lumbar paraspinals and right QL      Special Tests   Other special tests positive SLR on right      Transfers   Five time sit to stand comments  13 seconds with no UE support      Ambulation/Gait   Gait Comments decreased trunk dissociation and pelvic shifting. Decreased lumbar lordosis                        Objective measurements completed on examination: See above findings.       [facility] PT Treatment/Exercise - 09/12/20 0001       Exercises   Exercises Lumbar      Lumbar Exercises: Stretches   Single Knee to Chest Stretch 2 reps;10 seconds    Double Knee to Chest Stretch 3 reps;20 seconds    Prone on Elbows Stretch 30 seconds    Other Lumbar Stretch Exercise trunk rotation: x 2 holding 20 seconds each side      Lumbar Exercises: Supine   Bridge with Ball Squeeze 5 reps;5 seconds      Lumbar Exercises: Quadruped   Madcat/Old Horse 5 reps    Madcat/Old Horse Limitations holding 5 seconds, needed tactile cues                    PT Education - 09/12/20 1532     Education Details PT POC, HEP    Person(s) Educated Patient  Methods Explanation;Handout;Verbal cues;Tactile cues;Demonstration    Comprehension Returned demonstration              PT Short Term Goals - 09/12/20 1616       PT SHORT TERM GOAL #1   Title Pt will be independent in her HEP.    Time 3    Period Weeks    Status  New    Target Date 10/07/20               PT Long Term Goals - 09/12/20 1616       PT LONG TERM GOAL #1   Title Pt will be independent in advacned HEP.    Time 8    Period Weeks    Status New    Target Date 11/11/20      PT LONG TERM GOAL #2   Title Pt will improve her bilateral hip abduction/adduction strength to 5/5.    Time 8    Period Weeks    Status New    Target Date 11/11/20      PT LONG TERM GOAL #3   Title Pt will be able to amb 40-45 minutes with pain </= 2/10 reported in her low back.    Time 8    Period Weeks    Status New    Target Date 11/11/20      PT LONG TERM GOAL #4   Title Pt will improve her FOTO to >/= 70%.    Baseline 63% on 09/12/2020    Time 8    Period Weeks    Status New    Target Date 11/11/20                    Plan - 09/12/20 1621     Clinical Impression Statement Pt presenting for PT evaluation of her acute right sided LBP with right sided sciatica. Pt stating pain began around May 2022. Pt feels it began after she had been moving and lifting as well as taking care of her friends dog who pulled at the leash a lot while walking. Pt presenting wtih mild weakness of 4/5 in bilateral hip abductors and adductors. Pt with positive SLR on the right. Pt with good response to long axis distraction this visit with centralizing symptoms and decreased pain noted. Pt was edu in HEP but we were unable to save the plan due to website being down. Pt was able to copy the exercises on her phone with a screen shot and was able to return demonstration. New exercise program needs to be made at next visit. Skilled PT needed to address pt impairments with the below interventions.    Personal Factors and Comorbidities Comorbidity 3+    Comorbidities smoker, HTN, anxiety, SVT tachycardia    Examination-Activity Limitations Lift;Other;Locomotion Level;Stairs;Squat;Stand    Examination-Participation Restrictions Community Activity;Other;Yard Work     Stability/Clinical Decision Making Stable/Uncomplicated    Designer, jewellery Low    Rehab Potential Good    PT Frequency 1x / week    PT Duration 8 weeks    PT Treatment/Interventions Electrical Stimulation;ADLs/Self Care Home Management;Moist Heat;Traction;Ultrasound;Balance training;Therapeutic exercise;Therapeutic activities;Functional mobility training;Stair training;Gait training;Neuromuscular re-education;Patient/family education;Manual techniques;Passive range of motion;Taping;Dry needling;Spinal Manipulations;Joint Manipulations    PT Next Visit Plan recreate pt's HEP in medbridge, lumbar stretching, Long Axis Distraction, hip strengtheing    PT Home Exercise Plan DKTC, trunk rotation, clams in supine, bridge with ball squeeze, cat camel    Consulted and Agree with Plan of  Care Patient             Patient will benefit from skilled therapeutic intervention in order to improve the following deficits and impairments:  Pain, Difficulty walking, Abnormal gait, Decreased activity tolerance, Impaired flexibility, Decreased strength, Postural dysfunction, Decreased range of motion  Visit Diagnosis: Acute midline low back pain with right-sided sciatica  Muscle weakness (generalized)  Difficulty in walking, not elsewhere classified     Problem List Patient Active Problem List   Diagnosis Date Noted   SVT (supraventricular tachycardia) ([place]) 07/24/2017   Palpitations 05/26/2017   Xerosis of skin 02/21/2017   Low grade squamous intraepithelial lesion (LGSIL) on cervical Pap smear 07/03/2016   Pure hypercholesterolemia 12/22/2015   Anxiety state 12/21/2015   Other emphysema ([place]) 08/12/2013   Cough 08/12/2013   Smoking 08/12/2013   Essential hypertension 08/12/2013   Rash 08/21/2011   Abscess of thigh    Abscess of right thigh 07/25/2011    Oretha Caprice, PT, MPT 09/12/2020, 4:33 PM  [facility] [address] [place], [place],  [address] Phone: [phone]   Fax:  [phone]  Name: Melanie Harris MRN: [id number] Date of Birth: [birth year]

## 2020-09-19 ENCOUNTER — Encounter: Payer: Self-pay | Admitting: Physical Therapy

## 2020-09-27 ENCOUNTER — Encounter: Payer: Self-pay | Admitting: Physical Therapy

## 2020-09-27 ENCOUNTER — Ambulatory Visit (INDEPENDENT_AMBULATORY_CARE_PROVIDER_SITE_OTHER): Payer: Self-pay | Admitting: Physical Therapy

## 2020-09-27 ENCOUNTER — Other Ambulatory Visit: Payer: Self-pay

## 2020-09-27 DIAGNOSIS — R262 Difficulty in walking, not elsewhere classified: Secondary | ICD-10-CM

## 2020-09-27 DIAGNOSIS — M5441 Lumbago with sciatica, right side: Secondary | ICD-10-CM

## 2020-09-27 DIAGNOSIS — M6281 Muscle weakness (generalized): Secondary | ICD-10-CM

## 2020-09-27 NOTE — Patient Instructions (Signed)
Access Code: [email or web address] URL: [email or web address] Date: 09/27/2020 Prepared by: Kearney Hard  Exercises Supine Bridge - 2 x daily - 7 x weekly - 2 sets - 10 reps - 5 seconds hold Supine Single Knee to Chest Stretch - 2 x daily - 7 x weekly - 3 reps - 20 seconds hold Prone Press Up - 2 x daily - 7 x weekly - 10 reps - 10 seconds hold Supine Piriformis Stretch with Foot on Ground - 1 x daily - 7 x weekly - 3 reps - 30 seconds hold Supine Figure 4 Piriformis Stretch - 1 x daily - 7 x weekly - 3 reps - 30 seconds hold Cat Cow - 2 x daily - 7 x weekly - 10 reps - 10 seconds hold

## 2020-09-27 NOTE — Therapy (Signed)
[facility] [address] [place], [place], [address] Phone: [phone]   Fax:  [phone]  Physical Therapy Treatment  Patient Details  Name: Melanie Harris MRN: [id number] Date of Birth: 18-Sep-1960 Referring Provider (PT): Eunice Blase, MD   Encounter Date: 09/27/2020   PT End of Session - 09/27/20 1437     Visit Number 2    Number of Visits 8    Date for PT Re-Evaluation 11/11/20    PT Start Time 1430    PT Stop Time 1510    PT Time Calculation (min) 40 min    Activity Tolerance Patient tolerated treatment well    Behavior During Therapy [facility] for tasks assessed/performed             Past Medical History:  Diagnosis Date   Abscess of right thigh 07/25/2011   Abscess of thigh    right   Anxiety state 12/21/2015   Cough 08/12/2013   Hypertension    Low grade 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 Pathology negative 06/2016: LSIL/no hpv testing done   Other emphysema ([place]) 08/12/2013   Pure hypercholesterolemia 12/22/2015   Rash 08/21/2011   Smoking 08/12/2013   Xerosis of skin 02/21/2017    Past Surgical History:  Procedure Laterality Date   ABDOMINAL HYSTERECTOMY  2004   TONSILLECTOMY  [age] yrs old    There were no vitals filed for this visit.   Subjective Assessment - 09/27/20 1435     Subjective Pt arriving today reporting 1/10 pain in right side glutes. Pt reporting not doing her HEP last week due to visiting with friends.    Pertinent History smoker, HTN, SVT tachycardia, anxiety    How long can you walk comfortably? 15-20 minutes    Diagnostic tests X-ray    Patient Stated Goals get back to where I use to be, get back to walking more    Currently in Pain? Yes    Pain Score 1     Pain Location --   right glutes   Pain Orientation Right    Pain Descriptors / Indicators Sore;Aching    Pain Type Acute  pain    Pain Onset More than a month ago                               [facility] Adult PT Treatment/Exercise - 09/27/20 0001       Exercises   Exercises Lumbar      Lumbar Exercises: Stretches   Single Knee to Chest Stretch 2 reps;10 seconds    Double Knee to Chest Stretch 3 reps;20 seconds    Prone on Elbows Stretch 30 seconds    Press Ups 5 seconds;5 reps    Piriformis Stretch 3 reps;30 seconds    Figure 4 Stretch 3 reps;30 seconds    Other Lumbar Stretch Exercise trunk rotation: x 2 holding 20 seconds each side      Lumbar Exercises: Supine   Bridge with clamshell 10 reps;3 seconds      Lumbar Exercises: Quadruped   Madcat/Old Horse 5 reps    Madcat/Old Horse Limitations holding 5 seconds, needed tactile cues      Manual Therapy   Manual therapy comments Long axis distraction to right LE x 6 holding 30 seconds each, STM to right side lumbar parspinals  PT Education - 09/27/20 1500     Education Details updated HEP    Person(s) Educated Patient    Methods Explanation;Handout;Verbal cues;Tactile cues;Demonstration    Comprehension Verbalized understanding              PT Short Term Goals - 09/27/20 1501       PT SHORT TERM GOAL #1   Title Pt will be independent in her HEP.    Status On-going               PT Long Term Goals - 09/12/20 1616       PT LONG TERM GOAL #1   Title Pt will be independent in advacned HEP.    Time 8    Period Weeks    Status New    Target Date 11/11/20      PT LONG TERM GOAL #2   Title Pt will improve her bilateral hip abduction/adduction strength to 5/5.    Time 8    Period Weeks    Status New    Target Date 11/11/20      PT LONG TERM GOAL #3   Title Pt will be able to amb 40-45 minutes with pain </= 2/10 reported in her low back.    Time 8    Period Weeks    Status New    Target Date 11/11/20      PT LONG TERM GOAL #4   Title Pt will improve her FOTO to >/= 70%.     Baseline 63% on 09/12/2020    Time 8    Period Weeks    Status New    Target Date 11/11/20                   Plan - 09/27/20 1438     Clinical Impression Statement Pt arriving today reporting 1/10 pain in her right glutes. HEP reviwed to correct posture and technique, medbridge access code created this visit and exercises printed out.  Pt tolerating all exercises well with no reports of incresaed pain. Continue skilled PT progressing toward maximal function.    Personal Factors and Comorbidities Comorbidity 3+    Comorbidities smoker, HTN, anxiety, SVT tachycardia    Examination-Activity Limitations Lift;Other;Locomotion Level;Stairs;Squat;Stand    Examination-Participation Restrictions Community Activity;Other;Yard Work    Stability/Clinical Decision Making Stable/Uncomplicated    Rehab Potential Good    PT Frequency 1x / week    PT Duration 8 weeks    PT Treatment/Interventions Electrical Stimulation;ADLs/Self Care Home Management;Moist Heat;Traction;Ultrasound;Balance training;Therapeutic exercise;Therapeutic activities;Functional mobility training;Stair training;Gait training;Neuromuscular re-education;Patient/family education;Manual techniques;Passive range of motion;Taping;Dry needling;Spinal Manipulations;Joint Manipulations    PT Next Visit Plan recreate pt's HEP in medbridge, lumbar stretching, Long Axis Distraction, hip strengtheing    PT Home Exercise Plan Access Code: [email or web address]  URL: [email or web address]  Date: 09/27/2020  Prepared by: Kearney Hard    Exercises  Supine Bridge - 2 x daily - 7 x weekly - 2 sets - 10 reps - 5 seconds hold  Supine Single Knee to Chest Stretch - 2 x daily - 7 x weekly - 3 reps - 20 seconds hold  Prone Press Up - 2 x daily - 7 x weekly - 10 reps - 10 seconds hold  Supine Piriformis Stretch with Foot on Ground - 1 x daily - 7 x weekly - 3 reps - 30 seconds hold  Supine Figure 4 Piriformis Stretch - 1 x daily - 7 x weekly - 3 reps  - 30 seconds hold  Cat Cow - 2 x daily - 7 x weekly - 10 reps - 10 seconds hold    Consulted and Agree with Plan of Care Patient             Patient will benefit from skilled therapeutic intervention in order to improve the following deficits and impairments:  Pain, Difficulty walking, Abnormal gait, Decreased activity tolerance, Impaired flexibility, Decreased strength, Postural dysfunction, Decreased range of motion  Visit Diagnosis: Acute midline low back pain with right-sided sciatica  Muscle weakness (generalized)  Difficulty in walking, not elsewhere classified     Problem List Patient Active Problem List   Diagnosis Date Noted   SVT (supraventricular tachycardia) ([place]) 07/24/2017   Palpitations 05/26/2017   Xerosis of skin 02/21/2017   Low grade squamous intraepithelial lesion (LGSIL) on cervical Pap smear 07/03/2016   Pure hypercholesterolemia 12/22/2015   Anxiety state 12/21/2015   Other emphysema ([place]) 08/12/2013   Cough 08/12/2013   Smoking 08/12/2013   Essential hypertension 08/12/2013   Rash 08/21/2011   Abscess of thigh    Abscess of right thigh 07/25/2011    Oretha Caprice, PT, MPT 09/27/2020, 3:03 PM  [facility] [address] [place], [place], [address] Phone: [phone]   Fax:  [phone]  Name: Melanie Harris MRN: [id number] Date of Birth: 02-Apr-1960

## 2020-10-03 ENCOUNTER — Encounter: Payer: Self-pay | Admitting: Physical Therapy

## 2020-10-03 ENCOUNTER — Ambulatory Visit (INDEPENDENT_AMBULATORY_CARE_PROVIDER_SITE_OTHER): Payer: Self-pay | Admitting: Physical Therapy

## 2020-10-03 ENCOUNTER — Other Ambulatory Visit: Payer: Self-pay

## 2020-10-03 DIAGNOSIS — M6281 Muscle weakness (generalized): Secondary | ICD-10-CM

## 2020-10-03 DIAGNOSIS — M5441 Lumbago with sciatica, right side: Secondary | ICD-10-CM

## 2020-10-03 DIAGNOSIS — R262 Difficulty in walking, not elsewhere classified: Secondary | ICD-10-CM

## 2020-10-03 NOTE — Therapy (Signed)
[facility] [address] [place], [place], [address] Phone: [phone]   Fax:  [phone]  Physical Therapy Treatment  Patient Details  Name: Melanie Harris MRN: [id number] Date of Birth: [birth year] Referring Provider (PT): Eunice Blase, MD   Encounter Date: 10/03/2020   PT End of Session - 10/03/20 1552     Visit Number 3    Number of Visits 8    Date for PT Re-Evaluation 11/11/20    PT Start Time [id number]    PT Stop Time 1550    PT Time Calculation (min) 40 min    Activity Tolerance Patient tolerated treatment well    Behavior During Therapy [facility] for tasks assessed/performed             Past Medical History:  Diagnosis Date   Abscess of right thigh 07/25/2011   Abscess of thigh    right   Anxiety state 12/21/2015   Cough 08/12/2013   Hypertension    Low grade 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 Pathology negative 06/2016: LSIL/no hpv testing done   Other emphysema ([place]) 08/12/2013   Pure hypercholesterolemia 12/22/2015   Rash 08/21/2011   Smoking 08/12/2013   Xerosis of skin 02/21/2017    Past Surgical History:  Procedure Laterality Date   ABDOMINAL HYSTERECTOMY  2004   TONSILLECTOMY  [age] yrs old    There were no vitals filed for this visit.   Subjective Assessment - 10/03/20 1545     Subjective Pt stating no pain upon arrival. Pt reporting her HEP are" really helping."    Pertinent History smoker, HTN, SVT tachycardia, anxiety    How long can you walk comfortably? 15-20 minutes    Patient Stated Goals get back to where I use to be, get back to walking more    Currently in Pain? No/denies                               [facility] Adult PT Treatment/Exercise - 10/03/20 0001       Exercises   Exercises Lumbar      Lumbar Exercises: Stretches   Active Hamstring Stretch 2 reps;20  seconds;Right;Left    Hip Flexor Stretch 2 reps;30 seconds;Limitations    Hip Flexor Stretch Limitations with opposite knee to chest    Piriformis Stretch 3 reps;30 seconds    Piriformis Stretch Limitations seated    Figure 4 Stretch 3 reps;30 seconds    Figure 4 Stretch Limitations seated    Other Lumbar Stretch Exercise standing extension against the wall x 5 hodling 10 seconds each      Lumbar Exercises: Machines for Strengthening   Leg Press 50# 3x10 bilateral LE's      Lumbar Exercises: Seated   Other Seated Lumbar Exercises BATCA rows: 10# 2x10      Lumbar Exercises: Quadruped   Single Arm Raise Left;Right;10 reps    Straight Leg Raise 5 reps    Opposite Arm/Leg Raise Right arm/Left leg;Left arm/Right leg;5 reps;3 seconds      Manual Therapy   Manual therapy comments Long axis distraction to right LE x 6 holding 30 seconds each, STM to right side lumbar parspinals                      PT Short Term Goals - 09/27/20 1501  PT SHORT TERM GOAL #1   Title Pt will be independent in her HEP.    Status On-going               PT Long Term Goals - 09/12/20 1616       PT LONG TERM GOAL #1   Title Pt will be independent in advacned HEP.    Time 8    Period Weeks    Status New    Target Date 11/11/20      PT LONG TERM GOAL #2   Title Pt will improve her bilateral hip abduction/adduction strength to 5/5.    Time 8    Period Weeks    Status New    Target Date 11/11/20      PT LONG TERM GOAL #3   Title Pt will be able to amb 40-45 minutes with pain </= 2/10 reported in her low back.    Time 8    Period Weeks    Status New    Target Date 11/11/20      PT LONG TERM GOAL #4   Title Pt will improve her FOTO to >/= 70%.    Baseline 63% on 09/12/2020    Time 8    Period Weeks    Status New    Target Date 11/11/20                   Plan - 10/03/20 1552     Clinical Impression Statement Pt arriving today reporting no pain. Pt stating  her HEP are helping. Pt tolerating exercises well with lumbar stretcching and core strengtehning.  Long axis distraction performed to right LE with good response. Continue skilled PT to maximize function.    Personal Factors and Comorbidities Comorbidity 3+    Comorbidities smoker, HTN, anxiety, SVT tachycardia    Examination-Activity Limitations Lift;Other;Locomotion Level;Stairs;Squat;Stand    Examination-Participation Restrictions Community Activity;Other;Yard Work    Stability/Clinical Decision Making Stable/Uncomplicated    Rehab Potential Good    PT Frequency 1x / week    PT Duration 8 weeks    PT Treatment/Interventions Electrical Stimulation;ADLs/Self Care Home Management;Moist Heat;Traction;Ultrasound;Balance training;Therapeutic exercise;Therapeutic activities;Functional mobility training;Stair training;Gait training;Neuromuscular re-education;Patient/family education;Manual techniques;Passive range of motion;Taping;Dry needling;Spinal Manipulations;Joint Manipulations    PT Next Visit Plan lumbar stretching, Long Axis Distraction, hip strengtheing, core strengthening, extension exercises    PT Home Exercise Plan Access Code: [email or web address]  URL: [email or web address]  Date: 09/27/2020  Prepared by: Kearney Hard    Exercises  Supine Bridge - 2 x daily - 7 x weekly - 2 sets - 10 reps - 5 seconds hold  Supine Single Knee to Chest Stretch - 2 x daily - 7 x weekly - 3 reps - 20 seconds hold  Prone Press Up - 2 x daily - 7 x weekly - 10 reps - 10 seconds hold  Supine Piriformis Stretch with Foot on Ground - 1 x daily - 7 x weekly - 3 reps - 30 seconds hold  Supine Figure 4 Piriformis Stretch - 1 x daily - 7 x weekly - 3 reps - 30 seconds hold  Cat Cow - 2 x daily - 7 x weekly - 10 reps - 10 seconds hold    Consulted and Agree with Plan of Care Patient             Patient will benefit from skilled therapeutic intervention in order to improve the following deficits and impairments:   Pain, Difficulty walking, Abnormal gait, Decreased activity tolerance,  Impaired flexibility, Decreased strength, Postural dysfunction, Decreased range of motion  Visit Diagnosis: Acute midline low back pain with right-sided sciatica  Muscle weakness (generalized)  Difficulty in walking, not elsewhere classified     Problem List Patient Active Problem List   Diagnosis Date Noted   SVT (supraventricular tachycardia) ([place]) 07/24/2017   Palpitations 05/26/2017   Xerosis of skin 02/21/2017   Low grade squamous intraepithelial lesion (LGSIL) on cervical Pap smear 07/03/2016   Pure hypercholesterolemia 12/22/2015   Anxiety state 12/21/2015   Other emphysema ([place]) 08/12/2013   Cough 08/12/2013   Smoking 08/12/2013   Essential hypertension 08/12/2013   Rash 08/21/2011   Abscess of thigh    Abscess of right thigh 07/25/2011    Oretha Caprice, PT, MPT 10/03/2020, 4:00 PM  [facility] [address] [place], [place], [address] Phone: [phone]   Fax:  [phone]  Name: Jerricka Sowders MRN: [id number] Date of Birth: [birth year]

## 2020-10-06 ENCOUNTER — Other Ambulatory Visit: Payer: Self-pay

## 2020-10-07 ENCOUNTER — Other Ambulatory Visit: Payer: Self-pay

## 2020-10-11 ENCOUNTER — Encounter: Payer: Self-pay | Admitting: Physical Therapy

## 2020-10-11 ENCOUNTER — Ambulatory Visit (INDEPENDENT_AMBULATORY_CARE_PROVIDER_SITE_OTHER): Payer: Self-pay | Admitting: Physical Therapy

## 2020-10-11 ENCOUNTER — Other Ambulatory Visit: Payer: Self-pay

## 2020-10-11 DIAGNOSIS — M5441 Lumbago with sciatica, right side: Secondary | ICD-10-CM

## 2020-10-11 DIAGNOSIS — M6281 Muscle weakness (generalized): Secondary | ICD-10-CM

## 2020-10-11 DIAGNOSIS — R262 Difficulty in walking, not elsewhere classified: Secondary | ICD-10-CM

## 2020-10-11 NOTE — Therapy (Addendum)
[facility] [address] [place], [place], [address] Phone: [phone]   Fax:  [phone]  Physical Therapy Treatment /Discharge  Patient Details  Name: Melanie Harris MRN: [id number] Date of Birth: 26-Feb-1960 Referring Provider (PT): Eunice Blase, MD   Encounter Date: 10/11/2020   PT End of Session - 10/11/20 1530     Visit Number 4    Number of Visits 8    Date for PT Re-Evaluation 11/11/20    PT Start Time 1508    PT Stop Time 1546    PT Time Calculation (min) 38 min    Activity Tolerance Patient tolerated treatment well    Behavior During Therapy [facility] for tasks assessed/performed             Past Medical History:  Diagnosis Date   Abscess of right thigh 07/25/2011   Abscess of thigh    right   Anxiety state 12/21/2015   Cough 08/12/2013   Hypertension    Low grade squamous intraepithelial lesion (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 Pathology negative 06/2016: LSIL/no hpv testing done   Other emphysema ([place]) 08/12/2013   Pure hypercholesterolemia 12/22/2015   Rash 08/21/2011   Smoking 08/12/2013   Xerosis of skin 02/21/2017    Past Surgical History:  Procedure Laterality Date   ABDOMINAL HYSTERECTOMY  2004   TONSILLECTOMY  [age] yrs old    There were no vitals filed for this visit.   Subjective Assessment - 10/11/20 1528     Subjective Pt arriving reporting no pain today and reported having a pain free weekend.    Pertinent History smoker, HTN, SVT tachycardia, anxiety    How long can you walk comfortably? 15-20 minutes    Patient Stated Goals get back to where I use to be, get back to walking more    Currently in Pain? No/denies    Pain Onset More than a month ago                               [facility] Adult PT Treatment/Exercise - 10/11/20 0001       Exercises   Exercises Lumbar      Lumbar Exercises:  Stretches   Hip Flexor Stretch 2 reps;30 seconds;Limitations    Hip Flexor Stretch Limitations with opposite knee to chest    Figure 4 Stretch 3 reps;30 seconds;Supine    Other Lumbar Stretch Exercise standing extension against the wall x 5 hodling 10 seconds each      Lumbar Exercises: Aerobic   Recumbent Bike L3 x 8 minutes      Lumbar Exercises: Machines for Strengthening   Leg Press 50# 3x10 bilateral LE's      Lumbar Exercises: Seated   Sit to Stand 10 reps    Other Seated Lumbar Exercises BATCA rows: 15# 2x10      Lumbar Exercises: Supine   Single Leg Bridge 10 reps;Compliant;5 seconds      Lumbar Exercises: Prone   Other Prone Lumbar Exercises plank x5 holding 20 seconds each      Lumbar Exercises: Quadruped   Single Arm Raise Left;Right;10 reps    Straight Leg Raise 10 reps;5 seconds    Opposite Arm/Leg Raise Right arm/Left leg;Left arm/Right leg;5 seconds;10 reps      Manual Therapy   Manual therapy comments Long axis distraction to right LE  x 3 holding 30 seconds each                      PT Short Term Goals - 10/11/20 1600       PT SHORT TERM GOAL #1   Title Pt will be independent in her HEP.    Status Achieved               PT Long Term Goals - 10/11/20 1601       PT LONG TERM GOAL #1   Title Pt will be independent in advacned HEP.    Status On-going      PT LONG TERM GOAL #2   Title Pt will improve her bilateral hip abduction/adduction strength to 5/5.    Status On-going      PT LONG TERM GOAL #3   Title Pt will be able to amb 40-45 minutes with pain </= 2/10 reported in her low back.    Status Achieved      PT LONG TERM GOAL #4   Title Pt will improve her FOTO to >/= 70%.    Status On-going                   Plan - 10/11/20 1531     Clinical Impression Statement Pt reporting no pain today and over the weekend. Pt reporting compliance with her HEP. Treatment today focused on strengthening. lumbar strengthening and  core stability. Continue skilled PT.    Personal Factors and Comorbidities Comorbidity 3+    Comorbidities smoker, HTN, anxiety, SVT tachycardia    Examination-Activity Limitations Lift;Other;Locomotion Level;Stairs;Squat;Stand    Examination-Participation Restrictions Community Activity;Other;Yard Work    Stability/Clinical Decision Making Stable/Uncomplicated    Rehab Potential Good    PT Frequency 1x / week    PT Duration 8 weeks    PT Treatment/Interventions Electrical Stimulation;ADLs/Self Care Home Management;Moist Heat;Traction;Ultrasound;Balance training;Therapeutic exercise;Therapeutic activities;Functional mobility training;Stair training;Gait training;Neuromuscular re-education;Patient/family education;Manual techniques;Passive range of motion;Taping;Dry needling;Spinal Manipulations;Joint Manipulations    PT Next Visit Plan lumbar stretching, Long Axis Distraction, hip strengtheing, core strengthening, extension exercises    PT Home Exercise Plan Access Code: [email or web address]  URL: [email or web address]  Date: 09/27/2020  Prepared by: Kearney Hard    Exercises  Supine Bridge - 2 x daily - 7 x weekly - 2 sets - 10 reps - 5 seconds hold  Supine Single Knee to Chest Stretch - 2 x daily - 7 x weekly - 3 reps - 20 seconds hold  Prone Press Up - 2 x daily - 7 x weekly - 10 reps - 10 seconds hold  Supine Piriformis Stretch with Foot on Ground - 1 x daily - 7 x weekly - 3 reps - 30 seconds hold  Supine Figure 4 Piriformis Stretch - 1 x daily - 7 x weekly - 3 reps - 30 seconds hold  Cat Cow - 2 x daily - 7 x weekly - 10 reps - 10 seconds hold    Consulted and Agree with Plan of Care Patient             Patient will benefit from skilled therapeutic intervention in order to improve the following deficits and impairments:  Pain, Difficulty walking, Abnormal gait, Decreased activity tolerance, Impaired flexibility, Decreased strength, Postural dysfunction, Decreased range of  motion  Visit Diagnosis: Acute midline low back pain with right-sided sciatica  Muscle weakness (generalized)  Difficulty in walking, not elsewhere classified     Problem List Patient Active Problem List  Diagnosis Date Noted   SVT (supraventricular tachycardia) ([place]) 07/24/2017   Palpitations 05/26/2017   Xerosis of skin 02/21/2017   Low grade 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 Patient goals were partially met. Patient is being discharged due to not returning since the last visit.  Scot Jun, PT, DPT, OCS, ATC 11/16/20  9:17 AM     [facility] [address] [place], [place], [address] Phone: [phone]   Fax:  [phone]  Name: Melanie Harris MRN: [id number] Date of Birth: [birth year]

## 2020-10-25 ENCOUNTER — Encounter: Payer: Self-pay | Admitting: Physical Therapy

## 2020-11-01 ENCOUNTER — Encounter: Payer: Self-pay | Admitting: Physical Therapy

## 2020-11-01 ENCOUNTER — Other Ambulatory Visit: Payer: Self-pay

## 2020-11-01 ENCOUNTER — Ambulatory Visit (HOSPITAL_COMMUNITY)
Admission: EM | Admit: 2020-11-01 | Discharge: 2020-11-01 | Disposition: A | Discharge status: Home / Self Care | Payer: Self-pay | Attending: Emergency Medicine | Admitting: Emergency Medicine

## 2020-11-01 ENCOUNTER — Encounter (HOSPITAL_COMMUNITY): Payer: Self-pay | Admitting: Emergency Medicine

## 2020-11-01 DIAGNOSIS — L0291 Cutaneous abscess, unspecified: Secondary | ICD-10-CM

## 2020-11-01 DIAGNOSIS — J340 Abscess, furuncle and carbuncle of nose: Secondary | ICD-10-CM

## 2020-11-01 MED ORDER — SULFAMETHOXAZOLE-TRIMETHOPRIM 800-160 MG PO TABS
1.0000 | ORAL_TABLET | Freq: Two times a day (BID) | ORAL | 0 refills | Status: AC
  Filled 2020-11-01: qty 14, 7d supply, fill #0

## 2020-11-01 NOTE — Discharge Instructions (Signed)
Take the bactrim 1 pill twice a day for the next 7 days.   Apply a warm compress to your nose 3-4 times a day.  You can take Tylenol and/or Ibuprofen as needed for pain relief and fever reduction.   Follow up with primary care provider for re-evaluation.

## 2020-11-01 NOTE — ED Provider Notes (Signed)
[facility]    CSN: [id number] Arrival date & time: 11/01/20  1231      History   Chief Complaint Chief Complaint  Patient presents with   Mass    Inside nostril    HPI Melanie Harris is a 60 y.o. female.   Patient here for evaluation of abscess in left nare.  Reports developed swelling several months ago and was evaluated by her PCP.  At that time she was given Bactroban.  Reports swelling did resolve until approximately 2 weeks ago.  Started using Bactroban again but swelling has proceeded to get worse.  Has not tried any additional OTC medications or treatments.  Denies any trauma, injury, or other precipitating event.  Denies any specific alleviating or aggravating factors.  Denies any fevers, chest pain, shortness of breath, N/V/D, numbness, tingling, weakness, abdominal pain, or headaches.    The history is provided by the patient.   Past Medical History:  Diagnosis Date   Abscess of right thigh 07/25/2011   Abscess of thigh    right   Anxiety state 12/21/2015   Cough 08/12/2013   Hypertension    Low grade squamous intraepithelial lesion (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 Pathology negative 06/2016: LSIL/no hpv testing done   Other emphysema ([place]) 08/12/2013   Pure hypercholesterolemia 12/22/2015   Rash 08/21/2011   Smoking 08/12/2013   Xerosis of skin 02/21/2017    Patient Active Problem List   Diagnosis Date Noted   SVT (supraventricular tachycardia) ([place]) 07/24/2017   Palpitations 05/26/2017   Xerosis of skin 02/21/2017   Low grade squamous intraepithelial lesion (LGSIL) on cervical Pap smear 07/03/2016   Pure hypercholesterolemia 12/22/2015   Anxiety state 12/21/2015   Other emphysema ([place]) 08/12/2013   Cough 08/12/2013   Smoking 08/12/2013   Essential hypertension 08/12/2013   Rash 08/21/2011   Abscess of thigh    Abscess of right thigh 07/25/2011    Past  Surgical History:  Procedure Laterality Date   ABDOMINAL HYSTERECTOMY  2004   TONSILLECTOMY  10-12 yrs old    OB History     Gravida  1   Para  1   Term      Preterm      AB      Living  1      SAB      IAB      Ectopic      Multiple      Live Births  1            Home Medications    Prior to Admission medications   Medication Sig Start Date End Date Taking? Authorizing Provider  sulfamethoxazole-trimethoprim (BACTRIM DS) 800-160 MG tablet Take 1 tablet by mouth 2 (two) times daily for 7 days. 11/01/20 11/08/20 Yes Pearson Forster, NP  amLODipine (NORVASC) 10 MG tablet TAKE 1 TABLET (10 MG TOTAL) BY MOUTH DAILY. 06/09/20 06/09/21  Argentina Donovan, PA-C  atorvastatin (LIPITOR) 20 MG tablet TAKE 1 TABLET (20 MG TOTAL) BY MOUTH DAILY. 06/09/20 06/09/21  Argentina Donovan, PA-C  carboxymethylcellulose (REFRESH PLUS) 0.5 % SOLN Place 1 drop into both eyes 3 (three) times daily as needed (dry eyes).    [provider]  fluticasone (FLONASE) 50 MCG/ACT nasal spray Place 2 sprays into both nostrils daily. Patient not taking: Reported on 07/28/2020 12/25/18   Argentina Donovan, PA-C  gabapentin (NEURONTIN)  300 MG capsule Take 1 capsule (300 mg total) by mouth at bedtime as needed. 07/28/20   Argentina Donovan, PA-C  ibuprofen (ADVIL,MOTRIN) 200 MG tablet Take 400 mg by mouth every 6 (six) hours as needed for moderate pain.    [provider]  magnesium oxide (MAG-OX) 400 MG tablet TAKE 1 TABLET (400 MG TOTAL) BY MOUTH DAILY. 06/09/20 06/09/21  Argentina Donovan, PA-C  methocarbamol (ROBAXIN) 500 MG tablet Take 2 tablets by mouth 3 times daily for 7 days then take as needed. 07/28/20   Argentina Donovan, PA-C  metoprolol succinate (TOPROL-XL) 100 MG 24 hr tablet TAKE 1 TABLET (100 MG TOTAL) BY MOUTH DAILY. 06/09/20 06/09/21  Argentina Donovan, PA-C  Multiple Vitamin (MULTIVITAMIN) capsule Take 1 capsule by mouth daily.    [provider]  mupirocin ointment  (BACTROBAN) 2 % Place 1 application into the nose 2 (two) times daily. Use one-half of tube in each nostril twice daily for five (5) days. After application, press sides of nose together and gently massage. 06/10/20   Charlott Rakes, MD  predniSONE (DELTASONE) 10 MG tablet Take as directed for 12 days.  Daily dose 6,6,5,5,4,4,3,3,2,2,1,1. 08/12/20   Hilts, Legrand Como, MD  sodium chloride (OCEAN) 0.65 % SOLN nasal spray Place 1 spray into both nostrils as needed for congestion. Patient not taking: No sig reported    [provider]  triamcinolone cream (KENALOG) 0.1 % Apply 1 application topically 2 (two) times daily. Patient not taking: Reported on 07/28/2020 10/02/17   Charlott Rakes, MD  Vitamin D, Ergocalciferol, 2000 units CAPS Take 2,000 Units by mouth daily.    [provider]  mupirocin nasal ointment (BACTROBAN) 2 % Place 1 application into the nose 2 (two) times daily. Use one-half of tube in each nostril twice daily for five (5) days. After application, press sides of nose together and gently massage. 06/09/20 06/10/20  Argentina Donovan, PA-C    Family History Family History  Problem Relation Age of Onset   Heart failure Mother        started in her 55's   Hypertension Mother    Hyperlipidemia Mother    Lung cancer Father    Hypertension Father    Hypercholesterolemia Father    Diabetes Sister        diet controlled   Dysrhythmia Sister        possible in the past   Healthy Daughter     Social History Social History   Tobacco Use   Smoking status: Every Day    Packs/day: 0.25    Years: 30.00    Pack years: 7.50    Types: Cigarettes    Last attempt to quit: 05/15/2018    Years since quitting: 2.4   Smokeless tobacco: Never   Tobacco comments:    1-3 cigs daily  Vaping Use   Vaping Use: Never used  Substance Use Topics   Alcohol use: Yes    Alcohol/week: 1.0 standard drink    Types: 1 Glasses of wine per week    Comment: occassional   Drug use: No      Allergies   Doxycycline   Review of Systems Review of Systems  Constitutional:  Negative for fever.  HENT:  Positive for facial swelling.   All other systems reviewed and are negative.   Physical Exam Triage Vital Signs ED Triage Vitals  Enc Vitals Group     BP 11/01/20 1449 (!) 154/78     Pulse  Rate 11/01/20 1449 70     Resp 11/01/20 1449 17     Temp 11/01/20 1449 97.9 F (36.6 C)     Temp src --      SpO2 11/01/20 1449 96 %     Weight --      Height --      Head Circumference --      Peak Flow --      Pain Score 11/01/20 1447 3     Pain Loc --      Pain Edu? --      Excl. in [place]? --    No data found.  Updated Vital Signs BP (!) 154/78   Pulse 70   Temp 97.9 F (36.6 C)   Resp 17   SpO2 96%   Visual Acuity Right Eye Distance:   Left Eye Distance:   Bilateral Distance:    Right Eye Near:   Left Eye Near:    Bilateral Near:     Physical Exam Vitals and nursing note reviewed.  Constitutional:      General: She is not in acute distress.    Appearance: Normal appearance. She is not ill-appearing, toxic-appearing or diaphoretic.  HENT:     Head: Normocephalic and atraumatic.  Eyes:     Conjunctiva/sclera: Conjunctivae normal.  Cardiovascular:     Rate and Rhythm: Normal rate.     Pulses: Normal pulses.  Pulmonary:     Effort: Pulmonary effort is normal.  Abdominal:     General: Abdomen is flat.  Musculoskeletal:        General: Normal range of motion.     Cervical back: Normal range of motion.  Skin:    General: Skin is warm and dry.     Findings: Abscess (swelling and redness to inner left nare, with some erythema and edema to out left nare, inner nare with some drainage) present.  Neurological:     General: No focal deficit present.     Mental Status: She is alert and oriented to person, place, and time.  Psychiatric:        Mood and Affect: Mood normal.     UC Treatments / Results  Labs (all labs ordered are listed, but only  abnormal results are displayed) Labs Reviewed - No data to display  EKG   Radiology No results found.  Procedures Procedures (including critical care time)  Medications Ordered in UC Medications - No data to display  Initial Impression / Assessment and Plan / UC Course  I have reviewed the triage vital signs and the nursing notes.  Pertinent labs & imaging results that were available during my care of the patient were reviewed by me and considered in my medical decision making (see chart for details).    Assessment negative for red flags or concerns.  Likely abscess to left nare.  Will treat with Bactrim twice daily for the next 7 days.  Recommend warm compresses to nose 3-4 times a day.  May take Tylenol and/or ibuprofen as needed.  Follow-up with PCP as scheduled.  Strict ED follow-up for any red flag symptoms Final Clinical Impressions(s) / UC Diagnoses   Final diagnoses:  Abscess     Discharge Instructions      Take the bactrim 1 pill twice a day for the next 7 days.   Apply a warm compress to your nose 3-4 times a day.  You can take Tylenol and/or Ibuprofen as needed for pain relief and fever reduction.  Follow up with primary care provider for re-evaluation.      ED Prescriptions     Medication Sig Dispense Auth. Provider   sulfamethoxazole-trimethoprim (BACTRIM DS) 800-160 MG tablet Take 1 tablet by mouth 2 (two) times daily for 7 days. 14 tablet Pearson Forster, NP      PDMP not reviewed this encounter.   Pearson Forster, NP 11/01/20 [phone]

## 2020-11-01 NOTE — ED Triage Notes (Signed)
Pt is present today with a bump on the inside of her left nostril. Pt states that she was prescribed a cream by PCP and noticed it started getting better but afterwards returned to the same conditions.

## 2020-11-08 ENCOUNTER — Encounter: Payer: Self-pay | Admitting: Physical Therapy

## 2020-11-10 ENCOUNTER — Other Ambulatory Visit: Payer: Self-pay

## 2020-11-10 ENCOUNTER — Encounter: Payer: Self-pay | Admitting: Physician Assistant

## 2020-11-10 ENCOUNTER — Ambulatory Visit: Payer: Self-pay | Attending: Physician Assistant | Admitting: Physician Assistant

## 2020-11-10 VITALS — BP 134/74 | HR 88 | Ht 67.0 in | Wt 94.1 lb

## 2020-11-10 DIAGNOSIS — J3489 Other specified disorders of nose and nasal sinuses: Secondary | ICD-10-CM

## 2020-11-10 DIAGNOSIS — Z09 Encounter for follow-up examination after completed treatment for conditions other than malignant neoplasm: Secondary | ICD-10-CM

## 2020-11-10 MED ORDER — MUPIROCIN 2 % EX OINT
TOPICAL_OINTMENT | CUTANEOUS | 0 refills | Status: DC
  Filled 2020-11-10: qty 22, 7d supply, fill #0

## 2020-11-10 NOTE — Progress Notes (Signed)
Melanie Harris, is a 60 y.o. female  ZOX:[id number]  [id number]:[id number]  DOB - October 10, 1960  Chief Complaint  Patient presents with   Mass       Subjective:   Melanie Harris is a 60 y.o. female here today for a follow up visit after being seen in the UC for a recurrent infection of her nose.  She was seen  on 9/27 and prescribed bactrim X 7 days which does not seem to have helped much.  She has had this since May and it only seems to be getting worse and larger.  Patient has No headache, No chest pain, No abdominal pain - No Nausea, No new weakness tingling or numbness, No Cough - SOB.  No problems updated.  ALLERGIES: Allergies  Allergen Reactions   Doxycycline Rash    PAST MEDICAL HISTORY: Past Medical History:  Diagnosis Date   Abscess of right thigh 07/25/2011   Abscess of thigh    right   Anxiety state 12/21/2015   Cough 08/12/2013   Hypertension    Low grade squamous intraepithelial lesion (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 Pathology negative 06/2016: LSIL/no hpv testing done   Other emphysema ([place]) 08/12/2013   Pure hypercholesterolemia 12/22/2015   Rash 08/21/2011   Smoking 08/12/2013   Xerosis of skin 02/21/2017    MEDICATIONS AT HOME: Prior to Admission medications   Medication Sig Start Date End Date Taking? Authorizing Provider  amLODipine (NORVASC) 10 MG tablet TAKE 1 TABLET (10 MG TOTAL) BY MOUTH DAILY. 06/09/20 06/09/21 Yes Tia Hieronymus, Dionne Bucy, PA-C  atorvastatin (LIPITOR) 20 MG tablet TAKE 1 TABLET (20 MG TOTAL) BY MOUTH DAILY. 06/09/20 06/09/21 Yes Jarreau Callanan, Dionne Bucy, PA-C  ibuprofen (ADVIL,MOTRIN) 200 MG tablet Take 400 mg by mouth every 6 (six) hours as needed for moderate pain.   Yes [provider]  magnesium oxide (MAG-OX) 400 MG tablet TAKE 1 TABLET (400 MG TOTAL) BY MOUTH DAILY. 06/09/20 06/09/21 Yes Abigayle Wilinski, Dionne Bucy, PA-C  metoprolol succinate (TOPROL-XL) 100 MG 24 hr tablet TAKE  1 TABLET (100 MG TOTAL) BY MOUTH DAILY. 06/09/20 06/09/21 Yes Argentina Donovan, PA-C  Multiple Vitamin (MULTIVITAMIN) capsule Take 1 capsule by mouth daily.   Yes [provider]  Vitamin D, Ergocalciferol, 2000 units CAPS Take 2,000 Units by mouth daily.   Yes [provider]  carboxymethylcellulose (REFRESH PLUS) 0.5 % SOLN Place 1 drop into both eyes 3 (three) times daily as needed (dry eyes). Patient not taking: Reported on 11/10/2020    [provider]  fluticasone (FLONASE) 50 MCG/ACT nasal spray Place 2 sprays into both nostrils daily. Patient not taking: Reported on 11/10/2020 12/25/18   Argentina Donovan, PA-C  gabapentin (NEURONTIN) 300 MG capsule Take 1 capsule (300 mg total) by mouth at bedtime as needed. Patient not taking: Reported on 11/10/2020 07/28/20   Argentina Donovan, PA-C  methocarbamol (ROBAXIN) 500 MG tablet Take 2 tablets by mouth 3 times daily for 7 days then take as needed. Patient not taking: Reported on 11/10/2020 07/28/20   Argentina Donovan, PA-C  mupirocin ointment (BACTROBAN) 2 % Place 1 application into the nose 2 (two) times daily. Use one-half of tube in each nostril twice daily for five (5) days. After application, press sides of nose together and gently massage. 11/10/20   Argentina Donovan, PA-C  predniSONE (DELTASONE) 10 MG tablet Take as directed for 12 days.  Daily dose 6,6,5,5,4,4,3,3,2,2,1,1. Patient not taking: Reported on 11/10/2020 08/12/20   Hilts, Legrand Como, MD  sodium chloride (OCEAN) 0.65 % SOLN nasal spray Place 1 spray into both nostrils as needed for congestion. Patient not taking: Reported on 11/10/2020    [provider]  triamcinolone cream (KENALOG) 0.1 % Apply 1 application topically 2 (two) times daily. Patient not taking: Reported on 11/10/2020 10/02/17   Charlott Rakes, MD  mupirocin nasal ointment (BACTROBAN) 2 % Place 1 application into the nose 2 (two) times daily. Use one-half of tube in each nostril twice daily  for five (5) days. After application, press sides of nose together and gently massage. 06/09/20 06/10/20  Argentina Donovan, PA-C    ROS: Neg resp Neg cardiac Neg GI Neg GU Neg MS Neg psych Neg neuro  Objective:   Vitals:   11/10/20 1427  BP: 134/74  Pulse: 88  SpO2: 98%  Weight: 94 lb 2 oz (42.7 kg)  Height: 5\' 7"  (1.702 m)   Exam General appearance : Awake, alert, not in any distress. Speech Clear. Not toxic looking HEENT: Atraumatic and Normocephalic.  Nose with pustule outside of L nare but there is a mass-life lesion with a rough surface(?polyp) inside that is blocking the L nare.   Neck: Supple, no JVD. No cervical lymphadenopathy.  Chest: Good air entry bilaterally, CTAB.  No rales/rhonchi/wheezing CVS: S1 S2 regular, no murmurs.  Neurology: Awake alert, and oriented X 3, CN II-XII intact, Non focal Skin: No Rash  Data Review Lab Results  Component Value Date   HGBA1C 5.3 04/20/2013    Assessment & Plan   1. Sore in nose Concern for polyp/less/mass with recurrent infection - mupirocin ointment (BACTROBAN) 2 %; Place 1 application into the nose 2 (two) times daily. Use one-half of tube in each nostril twice daily for five (5) days. After application, press sides of nose together and gently massage.  Dispense: 22 g; Refill: 0 - Ambulatory referral to ENT  2. Encounter for examination following treatment at hospital    Patient have been counseled extensively about nutrition and exercise. Other issues discussed during this visit include: low cholesterol diet, weight control and daily exercise, foot care, annual eye examinations at Ophthalmology, importance of adherence with medications and regular follow-up. We also discussed long term complications of uncontrolled diabetes and hypertension.   Return for keep November appt with Dr Margarita Rana.  The patient was given clear instructions to go to ER or return to medical center if symptoms don't improve, worsen or new  problems develop. The patient verbalized understanding. The patient was told to call to get lab results if they haven't heard anything in the next week.      Freeman Caldron, PA-C [facility] and [place], [place]   11/10/2020, 2:52 PM Patient ID: Melanie Harris, female   DOB: 1960-12-19, 60 y.o.   MRN: [id number]

## 2020-11-15 ENCOUNTER — Encounter: Payer: Self-pay | Admitting: Physical Therapy

## 2020-11-16 ENCOUNTER — Telehealth: Payer: Self-pay | Admitting: Family Medicine

## 2020-11-16 NOTE — Telephone Encounter (Signed)
Copied from [place] [id number]. Topic: Referral - Status >> Nov 16, 2020  1:50 PM Yvette Rack wrote: Reason for CRM: Pt stated she has not heard from anyone regarding her referral request for a ENT specialist. Pt

## 2020-11-25 ENCOUNTER — Encounter: Payer: Self-pay | Admitting: Family Medicine

## 2020-11-25 ENCOUNTER — Other Ambulatory Visit: Payer: Self-pay | Admitting: Family Medicine

## 2020-11-25 ENCOUNTER — Other Ambulatory Visit: Payer: Self-pay

## 2020-11-25 MED ORDER — NIRMATRELVIR/RITONAVIR (PAXLOVID)TABLET
3.0000 | ORAL_TABLET | Freq: Two times a day (BID) | ORAL | 0 refills | Status: AC
  Filled 2020-11-25: qty 30, 5d supply, fill #0

## 2020-12-07 ENCOUNTER — Other Ambulatory Visit: Payer: Self-pay

## 2020-12-09 ENCOUNTER — Other Ambulatory Visit: Payer: Self-pay

## 2020-12-12 ENCOUNTER — Other Ambulatory Visit: Payer: Self-pay

## 2020-12-12 ENCOUNTER — Ambulatory Visit: Payer: Self-pay | Attending: Family Medicine | Admitting: Family Medicine

## 2020-12-12 ENCOUNTER — Encounter: Payer: Self-pay | Admitting: Family Medicine

## 2020-12-12 VITALS — BP 132/75 | HR 87 | Ht 67.0 in | Wt 89.0 lb

## 2020-12-12 DIAGNOSIS — F17200 Nicotine dependence, unspecified, uncomplicated: Secondary | ICD-10-CM

## 2020-12-12 DIAGNOSIS — J339 Nasal polyp, unspecified: Secondary | ICD-10-CM

## 2020-12-12 DIAGNOSIS — E7800 Pure hypercholesterolemia, unspecified: Secondary | ICD-10-CM

## 2020-12-12 DIAGNOSIS — I10 Essential (primary) hypertension: Secondary | ICD-10-CM

## 2020-12-12 DIAGNOSIS — Z23 Encounter for immunization: Secondary | ICD-10-CM

## 2020-12-12 MED ORDER — BUPROPION HCL ER (XL) 150 MG PO TB24
150.0000 mg | ORAL_TABLET | Freq: Every day | ORAL | 3 refills | Status: DC
  Filled 2020-12-12: qty 30, 30d supply, fill #0

## 2020-12-12 MED ORDER — SULFAMETHOXAZOLE-TRIMETHOPRIM 800-160 MG PO TABS
1.0000 | ORAL_TABLET | Freq: Two times a day (BID) | ORAL | 0 refills | Status: DC
Start: 2020-12-12 — End: 2021-01-05
  Filled 2020-12-12: qty 14, 7d supply, fill #0

## 2020-12-12 NOTE — Patient Instructions (Signed)
Managing the Challenge of Quitting Smoking ?Quitting smoking is a physical and mental challenge. You will face cravings, withdrawal symptoms, and temptation. Before quitting, work with your health care provider to make a plan that can help you manage quitting. Preparation can help you quit and keep you from giving in. ?How to manage lifestyle changes ?Managing stress ?Stress can make you want to smoke, and wanting to smoke may cause stress. It is important to find ways to manage your stress. You might try some of the following: ?Practice relaxation techniques. ?Breathe slowly and deeply, in through your nose and out through your mouth. ?Listen to music. ?Soak in a bath or take a shower. ?Imagine a peaceful place or vacation. ?Get some support. ?Talk with family or friends about your stress. ?Join a support group. ?Talk with a counselor or therapist. ?Get some physical activity. ?Go for a walk, run, or bike ride. ?Play a favorite sport. ?Practice yoga. ? ?Medicines ?Talk with your health care provider about medicines that might help you deal with cravings and make quitting easier for you. ?Relationships ?Social situations can be difficult when you are quitting smoking. To manage this, you can: ?Avoid parties and other social situations where people might be smoking. ?Avoid alcohol. ?Leave right away if you have the urge to smoke. ?Explain to your family and friends that you are quitting smoking. Ask for support and let them know you might be a bit grumpy. ?Plan activities where smoking is not an option. ?General instructions ?Be aware that many people gain weight after they quit smoking. However, not everyone does. To keep from gaining weight, have a plan in place before you quit and stick to the plan after you quit. Your plan should include: ?Having healthy snacks. When you have a craving, it may help to: ?Eat popcorn, carrots, celery, or other cut vegetables. ?Chew sugar-free gum. ?Changing how you eat. ?Eat small  portion sizes at meals. ?Eat 4-6 small meals throughout the day instead of 1-2 large meals a day. ?Be mindful when you eat. Do not watch television or do other things that might distract you as you eat. ?Exercising regularly. ?Make time to exercise each day. If you do not have time for a long workout, do short bouts of exercise for 5-10 minutes several times a day. ?Do some form of strengthening exercise, such as weight lifting. ?Do some exercise that gets your heart beating and causes you to breathe deeply, such as walking fast, running, swimming, or biking. This is very important. ?Drinking plenty of water or other low-calorie or no-calorie drinks. Drink 6-8 glasses of water daily. ? ?How to recognize withdrawal symptoms ?Your body and mind may experience discomfort as you try to get used to not having nicotine in your system. These effects are called withdrawal symptoms. They may include: ?Feeling hungrier than normal. ?Having trouble concentrating. ?Feeling irritable or restless. ?Having trouble sleeping. ?Feeling depressed. ?Craving a cigarette. ?To manage withdrawal symptoms: ?Avoid places, people, and activities that trigger your cravings. ?Remember why you want to quit. ?Get plenty of sleep. ?Avoid coffee and other caffeinated drinks. These may worsen some of your symptoms. ?These symptoms may surprise you. But be assured that they are normal to have when quitting smoking. ?How to manage cravings ?Come up with a plan for how to deal with your cravings. The plan should include the following: ?A definition of the specific situation you want to deal with. ?An alternative action you will take. ?A clear idea for how this action   will help. ?The name of someone who might help you with this. ?Cravings usually last for 5-10 minutes. Consider taking the following actions to help you with your plan to deal with cravings: ?Keep your mouth busy. ?Chew sugar-free gum. ?Suck on hard candies or a straw. ?Brush your  teeth. ?Keep your hands and body busy. ?Change to a different activity right away. ?Squeeze or play with a ball. ?Do an activity or a hobby, such as making bead jewelry, practicing needlepoint, or working with wood. ?Mix up your normal routine. ?Take a short exercise break. Go for a quick walk or run up and down stairs. ?Focus on doing something kind or helpful for someone else. ?Call a friend or family member to talk during a craving. ?Join a support group. ?Contact a quitline. ?Where to find support ?To get help or find a support group: ?Call the National Cancer Institute's Smoking Quitline: 1-800-QUIT NOW (784-8669) ?Visit the website of the Substance Abuse and Mental Health Services Administration: www.samhsa.gov ?Text QUIT to SmokefreeTXT: 478848 ?Where to find more information ?Visit these websites to find more information on quitting smoking: ?National Cancer Institute: www.smokefree.gov ?American Lung Association: www.lung.org ?American Cancer Society: www.cancer.org ?Centers for Disease Control and Prevention: www.cdc.gov ?American Heart Association: www.heart.org ?Contact a health care provider if: ?You want to change your plan for quitting. ?The medicines you are taking are not helping. ?Your eating feels out of control or you cannot sleep. ?Get help right away if: ?You feel depressed or become very anxious. ?Summary ?Quitting smoking is a physical and mental challenge. You will face cravings, withdrawal symptoms, and temptation to smoke again. Preparation can help you as you go through these challenges. ?Try different techniques to manage stress, handle social situations, and prevent weight gain. ?You can deal with cravings by keeping your mouth busy (such as by chewing gum), keeping your hands and body busy, calling family or friends, or contacting a quitline for people who want to quit smoking. ?You can deal with withdrawal symptoms by avoiding places where people smoke, getting plenty of rest, and  avoiding drinks with caffeine. ?This information is not intended to replace advice given to you by your health care provider. Make sure you discuss any questions you have with your health care provider. ?Document Revised: 09/30/2020 Document Reviewed: 11/11/2018 ?Elsevier Patient Education ? 2022 Elsevier Inc. ? ?

## 2020-12-12 NOTE — Progress Notes (Signed)
Subjective:  Patient ID: Melanie Harris, female    DOB: 10-17-1960  Age: 60 y.o. MRN: [id number]  CC: [facility] [place] is a 60 y.o. year old female with a history of hypertension, hyperlipidemia hyperlipidemia, palpitations (due to PACs and PVCs) tobacco abuse seen today for a follow-up visit.  Interval History: She had a visit with the physician assistant 1 month ago due to the presence of a sore in her nose and was referred to ENT at that visit. She has had this lesion for close to about 7 months and has been seen at urgent care as well with her last visit 2 months ago where she was diagnosed with an abscess, treated with Bactrim. She will be seeing Dr Olga Coaster next week. She thinks she perceives an odor and she had pus comig out and has difficulty breathing out of her left left nostril. Sometimes has a shooting pain in her left maxilla  She smokes 3 cig/day. Smoked since she was 22 years off and on -the maximum she has smoked is 10 cig/day. Compliant with her antihypertensive and her statin.  Past Medical History:  Diagnosis Date   Abscess of right thigh 07/25/2011   Abscess of thigh    right   Anxiety state 12/21/2015   Cough 08/12/2013   Hypertension    Low grade squamous intraepithelial lesion (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 Pathology negative 06/2016: LSIL/no hpv testing done   Other emphysema ([place]) 08/12/2013   Pure hypercholesterolemia 12/22/2015   Rash 08/21/2011   Smoking 08/12/2013   Xerosis of skin 02/21/2017    Past Surgical History:  Procedure Laterality Date   ABDOMINAL HYSTERECTOMY  2004   TONSILLECTOMY  10-12 yrs old    Family History  Problem Relation Age of Onset   Heart failure Mother        started in her 8's   Hypertension Mother    Hyperlipidemia Mother    Lung cancer Father    Hypertension Father    Hypercholesterolemia  Father    Diabetes Sister        diet controlled   Dysrhythmia Sister        possible in the past   Healthy Daughter     Allergies  Allergen Reactions   Doxycycline Rash    Outpatient Medications Prior to Visit  Medication Sig Dispense Refill   amLODipine (NORVASC) 10 MG tablet TAKE 1 TABLET (10 MG TOTAL) BY MOUTH DAILY. 30 tablet 6   atorvastatin (LIPITOR) 20 MG tablet TAKE 1 TABLET (20 MG TOTAL) BY MOUTH DAILY. 30 tablet 6   ibuprofen (ADVIL,MOTRIN) 200 MG tablet Take 400 mg by mouth every 6 (six) hours as needed for moderate pain.     magnesium oxide (MAG-OX) 400 MG tablet TAKE 1 TABLET (400 MG TOTAL) BY MOUTH DAILY. 90 tablet 3   metoprolol succinate (TOPROL-XL) 100 MG 24 hr tablet TAKE 1 TABLET (100 MG TOTAL) BY MOUTH DAILY. 30 tablet 6   Multiple Vitamin (MULTIVITAMIN) capsule Take 1 capsule by mouth daily.     Vitamin D, Ergocalciferol, 2000 units CAPS Take 2,000 Units by mouth daily.     carboxymethylcellulose (REFRESH PLUS) 0.5 % SOLN Place 1 drop into both eyes 3 (three) times daily as needed (dry eyes). (Patient not taking: No sig reported)     fluticasone (FLONASE) 50 MCG/ACT nasal spray Place  2 sprays into both nostrils daily. (Patient not taking: No sig reported) 16 g 6   gabapentin (NEURONTIN) 300 MG capsule Take 1 capsule (300 mg total) by mouth at bedtime as needed. (Patient not taking: No sig reported) 30 capsule 0   methocarbamol (ROBAXIN) 500 MG tablet Take 2 tablets by mouth 3 times daily for 7 days then take as needed. (Patient not taking: No sig reported) 90 tablet 0   mupirocin ointment (BACTROBAN) 2 % Place 1 application into the nose 2 (two) times daily. Use one-half of tube in each nostril twice daily for five (5) days. After application, press sides of nose together and gently massage. (Patient not taking: Reported on 12/12/2020) 22 g 0   predniSONE (DELTASONE) 10 MG tablet Take as directed for 12 days.  Daily dose 6,6,5,5,4,4,3,3,2,2,1,1. (Patient not taking: No  sig reported) 42 tablet 0   sodium chloride (OCEAN) 0.65 % SOLN nasal spray Place 1 spray into both nostrils as needed for congestion. (Patient not taking: No sig reported)     triamcinolone cream (KENALOG) 0.1 % Apply 1 application topically 2 (two) times daily. (Patient not taking: No sig reported) 85.2 g 0   No facility-administered medications prior to visit.     ROS Review of Systems  Constitutional:  Negative for activity change, appetite change and fatigue.  HENT:  Negative for congestion, sinus pressure and sore throat.   Eyes:  Negative for visual disturbance.  Respiratory:  Negative for cough, chest tightness, shortness of breath and wheezing.   Cardiovascular:  Negative for chest pain and palpitations.  Gastrointestinal:  Negative for abdominal distention, abdominal pain and constipation.  Endocrine: Negative for polydipsia.  Genitourinary:  Negative for dysuria and frequency.  Musculoskeletal:  Negative for arthralgias and back pain.  Skin:  Negative for rash.  Neurological:  Negative for tremors, light-headedness and numbness.  Hematological:  Does not bruise/bleed easily.  Psychiatric/Behavioral:  Negative for agitation and behavioral problems.    Objective:  BP 132/75   Pulse 87   Ht 5' 7"  (1.702 m)   Wt 89 lb (40.4 kg)   SpO2 100%   BMI 13.94 kg/m   BP/Weight 12/12/2020 11/10/2020 8/58/8502  Systolic BP 774 128 786  Diastolic BP 75 74 78  Wt. (Lbs) 89 94.13 -  BMI 13.94 14.74 -      Physical Exam Constitutional:      Appearance: She is well-developed.  HENT:     Nose:     Comments: Mass in left nostril occupying three quarters of Nost[place]illa with another erythematous mass superior to left alae with two punctum.  No discharge noted Cardiovascular:     Rate and Rhythm: Normal rate.     Heart sounds: Normal heart sounds. No murmur heard. Pulmonary:     Effort: Pulmonary effort is normal.     Breath sounds: Normal breath sounds. No wheezing or rales.   Chest:     Chest wall: No tenderness.  Abdominal:     General: Bowel sounds are normal. There is no distension.     Palpations: Abdomen is soft. There is no mass.     Tenderness: There is no abdominal tenderness.  Musculoskeletal:        General: Normal range of motion.     Right lower leg: No edema.     Left lower leg: No edema.  Neurological:     Mental Status: She is alert and oriented to person, place, and time.  Psychiatric:  Mood and Affect: Mood normal.    CMP Latest Ref Rng & Units 06/09/2020 05/08/2019 12/25/2018  Glucose 65 - 99 mg/dL 94 102(H) 95  BUN 6 - 24 mg/dL 20 14 10   Creatinine 0.57 - 1.00 mg/dL 0.67 0.66 0.66  Sodium 134 - 144 mmol/L 137 140 139  Potassium 3.5 - 5.2 mmol/L 3.9 4.1 4.6  Chloride 96 - 106 mmol/L 98 101 101  CO2 20 - 29 mmol/L 22 24 21   Calcium 8.7 - 10.2 mg/dL 9.7 9.8 10.2  Total Protein 6.0 - 8.5 g/dL 7.1 7.4 7.3  Total Bilirubin 0.0 - 1.2 mg/dL 0.4 0.5 0.4  Alkaline Phos 44 - 121 IU/L 99 103 88  AST 0 - 40 IU/L 27 38 28  ALT 0 - 32 IU/L 30 30 30     Lipid Panel     Component Value Date/Time   CHOL 177 06/09/2020 1625   TRIG 101 06/09/2020 1625   HDL 98 06/09/2020 1625   CHOLHDL 1.8 06/09/2020 1625   CHOLHDL 1.7 12/21/2015 1230   VLDL 14 12/21/2015 1230   LDLCALC 61 06/09/2020 1625    CBC    Component Value Date/Time   WBC 7.2 06/09/2020 1625   WBC 10.0 04/29/2017 0859   RBC 3.84 06/09/2020 1625   RBC 4.42 04/29/2017 0859   HGB 12.5 06/09/2020 1625   HCT 37.1 06/09/2020 1625   PLT 256 06/09/2020 1625   MCV 97 06/09/2020 1625   MCH 32.6 06/09/2020 1625   MCH 32.1 04/29/2017 0859   MCHC 33.7 06/09/2020 1625   MCHC 34.2 04/29/2017 0859   RDW 12.3 06/09/2020 1625   LYMPHSABS 2.3 06/09/2020 1625   MONOABS 0.7 10/30/2016 1222   EOSABS 0.1 06/09/2020 1625   BASOSABS 0.1 06/09/2020 1625    Lab Results  Component Value Date   HGBA1C 5.3 04/20/2013    Assessment & Plan:  1. Left nasal polyps This has been recurrent  over the last 7 months Concern for malignancy especially given history of smoking and she might need a biopsy There does seem to be some superimposed infection and I will place her on an antibiotic Has upcoming appointment with ENT in 1 week - sulfamethoxazole-trimethoprim (BACTRIM DS) 800-160 MG tablet; Take 1 tablet by mouth 2 (two) times daily.  Dispense: 14 tablet; Refill: 0  2. Essential hypertension Controlled Continue current antihypertensive Counseled on blood pressure goal of less than 130/80, low-sodium, DASH diet, medication compliance, 150 minutes of moderate intensity exercise per week. Discussed medication compliance, adverse effects. - CMP14+EGFR  3. Pure hypercholesterolemia Controlled Continue statin Low-cholesterol diet  4. Smoking Spent 3 minutes counseling on smoking cessation and she is willing to work on quitting She has a less than 20-pack-year history of smoking hence does not meet criteria for lung cancer screening - buPROPion (WELLBUTRIN XL) 150 MG 24 hr tablet; Take 1 tablet (150 mg total) by mouth daily. For smoking cessation  Dispense: 30 tablet; Refill: 3  5. Need for immunization against influenza - Flu Vaccine QUAD 41moIM (Fluarix, Fluzone & Alfiuria Quad PF)   Meds ordered this encounter  Medications   sulfamethoxazole-trimethoprim (BACTRIM DS) 800-160 MG tablet    Sig: Take 1 tablet by mouth 2 (two) times daily.    Dispense:  14 tablet    Refill:  0   buPROPion (WELLBUTRIN XL) 150 MG 24 hr tablet    Sig: Take 1 tablet (150 mg total) by mouth daily. For smoking cessation    Dispense:  30 tablet  Refill:  3    Follow-up: Return in about 6 months (around 06/11/2021) for Medical conditions.       Charlott Rakes, MD, FAAFP. [facility] and [place], [place]   12/12/2020, 5:39 PM

## 2020-12-12 NOTE — Progress Notes (Signed)
HAS SORE IN NOSE.

## 2020-12-13 ENCOUNTER — Other Ambulatory Visit: Payer: Self-pay

## 2020-12-13 LAB — CMP14+EGFR
ALT: 25 IU/L (ref 0–32)
AST: 23 IU/L (ref 0–40)
Albumin/Globulin Ratio: 2.1 (ref 1.2–2.2)
Albumin: 4.7 g/dL (ref 3.8–4.9)
Alkaline Phosphatase: 107 IU/L (ref 44–121)
BUN/Creatinine Ratio: 15 (ref 9–23)
BUN: 9 mg/dL (ref 6–24)
Bilirubin Total: 0.2 mg/dL (ref 0.0–1.2)
CO2: 24 mmol/L (ref 20–29)
Calcium: 9.6 mg/dL (ref 8.7–10.2)
Chloride: 101 mmol/L (ref 96–106)
Creatinine, Ser: 0.62 mg/dL (ref 0.57–1.00)
Globulin, Total: 2.2 g/dL (ref 1.5–4.5)
Glucose: 103 mg/dL — ABNORMAL HIGH (ref 70–99)
Potassium: 4.2 mmol/L (ref 3.5–5.2)
Sodium: 142 mmol/L (ref 134–144)
Total Protein: 6.9 g/dL (ref 6.0–8.5)
eGFR: 103 mL/min/{1.73_m2} (ref >59)

## 2020-12-19 DIAGNOSIS — E049 Nontoxic goiter, unspecified: Secondary | ICD-10-CM | POA: Insufficient documentation

## 2020-12-20 ENCOUNTER — Other Ambulatory Visit: Payer: Self-pay | Admitting: Otolaryngology

## 2020-12-20 DIAGNOSIS — E049 Nontoxic goiter, unspecified: Secondary | ICD-10-CM

## 2020-12-20 DIAGNOSIS — J3489 Other specified disorders of nose and nasal sinuses: Secondary | ICD-10-CM

## 2021-01-05 ENCOUNTER — Other Ambulatory Visit: Payer: Self-pay

## 2021-01-05 ENCOUNTER — Ambulatory Visit
Admission: RE | Admit: 2021-01-05 | Discharge: 2021-01-05 | Disposition: A | Discharge status: Home / Self Care | Payer: No Typology Code available for payment source | Source: Ambulatory Visit | Attending: Otolaryngology | Admitting: Otolaryngology

## 2021-01-05 ENCOUNTER — Ambulatory Visit (INDEPENDENT_AMBULATORY_CARE_PROVIDER_SITE_OTHER): Payer: Self-pay | Admitting: Plastic Surgery

## 2021-01-05 VITALS — BP 160/74 | HR 82 | Ht 67.0 in | Wt 93.0 lb

## 2021-01-05 DIAGNOSIS — E049 Nontoxic goiter, unspecified: Secondary | ICD-10-CM

## 2021-01-05 DIAGNOSIS — D049 Carcinoma in situ of skin, unspecified: Secondary | ICD-10-CM

## 2021-01-05 DIAGNOSIS — J3489 Other specified disorders of nose and nasal sinuses: Secondary | ICD-10-CM

## 2021-01-05 NOTE — Progress Notes (Signed)
Referring Provider Charlott Rakes, MD [place],  [place] [id number]   CC:  Chief Complaint  Patient presents with   Advice Only      Melanie Harris is an 60 y.o. female.  HPI: Patient presents as a referral from Dr. Constance Holster for coordination and treatment of a left nasal mass.  Patient says has been present for at least a few months.  She thought it was a boil.  She has been getting crusting intranasally and ultimately saw Dr. Constance Holster with ENT.  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  He sent her here to discuss reconstruction following excision of that.  Allergies  Allergen Reactions   Doxycycline Rash    Outpatient Encounter Medications as of 01/05/2021  Medication Sig Note   amLODipine (NORVASC) 10 MG tablet TAKE 1 TABLET (10 MG TOTAL) BY MOUTH DAILY.    atorvastatin (LIPITOR) 20 MG tablet TAKE 1 TABLET (20 MG TOTAL) BY MOUTH DAILY.    buPROPion (WELLBUTRIN XL) 150 MG 24 hr tablet Take 1 tablet (150 mg total) by mouth daily. For smoking cessation    gabapentin (NEURONTIN) 300 MG capsule Take 1 capsule (300 mg total) by mouth at bedtime as needed.    ibuprofen (ADVIL,MOTRIN) 200 MG tablet Take 400 mg by mouth every 6 (six) hours as needed for moderate pain.    magnesium oxide (MAG-OX) 400 MG tablet TAKE 1 TABLET (400 MG TOTAL) BY MOUTH DAILY.    Multiple Vitamin (MULTIVITAMIN) capsule Take 1 capsule by mouth daily.    Vitamin D, Ergocalciferol, 2000 units CAPS Take 2,000 Units by mouth daily.    [DISCONTINUED] carboxymethylcellulose (REFRESH PLUS) 0.5 % SOLN Place 1 drop into both eyes 3 (three) times daily as needed (dry eyes).    [DISCONTINUED] fluticasone (FLONASE) 50 MCG/ACT nasal spray Place 2 sprays into both nostrils daily.    [DISCONTINUED] methocarbamol (ROBAXIN) 500 MG tablet Take 2 tablets by mouth 3 times daily for 7 days then take as needed.    [DISCONTINUED] metoprolol succinate  (TOPROL-XL) 100 MG 24 hr tablet TAKE 1 TABLET (100 MG TOTAL) BY MOUTH DAILY.    [DISCONTINUED] mupirocin ointment (BACTROBAN) 2 % Place 1 application into the nose 2 (two) times daily. Use one-half of tube in each nostril twice daily for five (5) days. After application, press sides of nose together and gently massage.    [DISCONTINUED] predniSONE (DELTASONE) 10 MG tablet Take as directed for 12 days.  Daily dose 6,6,5,5,4,4,3,3,2,2,1,1.    [DISCONTINUED] sodium chloride (OCEAN) 0.65 % SOLN nasal spray Place 1 spray into both nostrils as needed for congestion.    [DISCONTINUED] sulfamethoxazole-trimethoprim (BACTRIM DS) 800-160 MG tablet Take 1 tablet by mouth 2 (two) times daily.    [DISCONTINUED] triamcinolone cream (KENALOG) 0.1 % Apply 1 application topically 2 (two) times daily.    [DISCONTINUED] mupirocin nasal ointment (BACTROBAN) 2 % Place 1 application into the nose 2 (two) times daily. Use one-half of tube in each nostril twice daily for five (5) days. After application, press sides of nose together and gently massage. 06/10/2020: getting luke to change   No facility-administered encounter medications on file as of 01/05/2021.     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 Pathology negative 06/2016: LSIL/no hpv testing done   Other emphysema ([place]) 08/12/2013   Pure hypercholesterolemia 12/22/2015   Rash 08/21/2011   Smoking 08/12/2013   Xerosis of skin 02/21/2017    Past Surgical History:  Procedure Laterality Date   ABDOMINAL HYSTERECTOMY  2004   TONSILLECTOMY  10-12 yrs old    Family History  Problem Relation Age of Onset   Heart failure Mother        started in her 18's   Hypertension Mother     Hyperlipidemia Mother    Lung cancer Father    Hypertension Father    Hypercholesterolemia Father    Diabetes Sister        diet controlled   Dysrhythmia Sister        possible in the past   Healthy Daughter     Social History   Social History Narrative   Not on file     Review of Systems General: Denies fevers, chills, weight loss CV: Denies chest pain, shortness of breath, palpitations  Physical Exam Vitals with BMI 01/05/2021 12/12/2020 11/10/2020  Height 5\' 7"  5\' 7"  5\' 7"   Weight 93 lbs 89 lbs 94 lbs 2 oz  BMI 14.56 09.81 19.14  Systolic 782 956 213  Diastolic 74 75 74  Pulse 82 87 88    General:  No acute distress,  Alert and oriented, Non-Toxic, Normal speech and affect Examination shows approximately 2.5 x 2 cm external left nasal alar mass with some focal ulceration.  There is intranasal crusting.  No obvious lymphadenopathy.  No obvious scars in the forehead or cheek.  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  We will plan a coordinated excision with Dr. Constance Holster and subsequent reconstruction.  I did explain given the size of it I would expect that she would require a forehead flap reconstruction with cartilage graft for support.  I showed her pictures of this and we discussed that this is a staged procedure.  We reviewed risks include bleeding, infection, damage to surrounding structures need for additional procedures.  We also discussed the possibility of a delayed reconstruction to wait for pathology depending on how comfortable we were with the margins.  Patient is fully understanding and will discuss with Dr. Constance Holster and move forward to coordinate a good plan for her.  The patient does smoke regularly and I did explain the increased risk associated with that in terms of healing particularly with flaps and tissue transfer.  She is understanding and will do her best to cut back.  [name] 01/05/2021, 1:10 PM

## 2021-01-05 NOTE — H&P (View-Only) (Signed)
Referring Provider Melanie Rakes, MD [place],  [place] [address]   CC:  Chief Complaint  Patient presents with   Advice Only      Melanie Harris is an 60 y.o. female.  HPI: Patient presents as a referral from Dr. Constance Harris for coordination and treatment of a left nasal mass.  Patient says has been present for at least a few months.  She thought it was a boil.  She has been getting crusting intranasally and ultimately saw Dr. Constance Harris with ENT.  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  He sent her here to discuss reconstruction following excision of that.  Allergies  Allergen Reactions   Doxycycline Rash    Outpatient Encounter Medications as of 01/05/2021  Medication Sig Note   amLODipine (NORVASC) 10 MG tablet TAKE 1 TABLET (10 MG TOTAL) BY MOUTH DAILY.    atorvastatin (LIPITOR) 20 MG tablet TAKE 1 TABLET (20 MG TOTAL) BY MOUTH DAILY.    buPROPion (WELLBUTRIN XL) 150 MG 24 hr tablet Take 1 tablet (150 mg total) by mouth daily. For smoking cessation    gabapentin (NEURONTIN) 300 MG capsule Take 1 capsule (300 mg total) by mouth at bedtime as needed.    ibuprofen (ADVIL,MOTRIN) 200 MG tablet Take 400 mg by mouth every 6 (six) hours as needed for moderate pain.    magnesium oxide (MAG-OX) 400 MG tablet TAKE 1 TABLET (400 MG TOTAL) BY MOUTH DAILY.    Multiple Vitamin (MULTIVITAMIN) capsule Take 1 capsule by mouth daily.    Vitamin D, Ergocalciferol, 2000 units CAPS Take 2,000 Units by mouth daily.    [DISCONTINUED] carboxymethylcellulose (REFRESH PLUS) 0.5 % SOLN Place 1 drop into both eyes 3 (three) times daily as needed (dry eyes).    [DISCONTINUED] fluticasone (FLONASE) 50 MCG/ACT nasal spray Place 2 sprays into both nostrils daily.    [DISCONTINUED] methocarbamol (ROBAXIN) 500 MG tablet Take 2 tablets by mouth 3 times daily for 7 days then take as needed.    [DISCONTINUED] metoprolol succinate  (TOPROL-XL) 100 MG 24 hr tablet TAKE 1 TABLET (100 MG TOTAL) BY MOUTH DAILY.    [DISCONTINUED] mupirocin ointment (BACTROBAN) 2 % Place 1 application into the nose 2 (two) times daily. Use one-half of tube in each nostril twice daily for five (5) days. After application, press sides of nose together and gently massage.    [DISCONTINUED] predniSONE (DELTASONE) 10 MG tablet Take as directed for 12 days.  Daily dose 6,6,5,5,4,4,3,3,2,2,1,1.    [DISCONTINUED] sodium chloride (OCEAN) 0.65 % SOLN nasal spray Place 1 spray into both nostrils as needed for congestion.    [DISCONTINUED] sulfamethoxazole-trimethoprim (BACTRIM DS) 800-160 MG tablet Take 1 tablet by mouth 2 (two) times daily.    [DISCONTINUED] triamcinolone cream (KENALOG) 0.1 % Apply 1 application topically 2 (two) times daily.    [DISCONTINUED] mupirocin nasal ointment (BACTROBAN) 2 % Place 1 application into the nose 2 (two) times daily. Use one-half of tube in each nostril twice daily for five (5) days. After application, press sides of nose together and gently massage. 06/10/2020: getting luke to change   No facility-administered encounter medications on file as of 01/05/2021.     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 Pathology negative 06/2016: LSIL/no hpv testing done   Other emphysema ([place]) 08/12/2013   Pure hypercholesterolemia 12/22/2015   Rash 08/21/2011   Smoking 08/12/2013   Xerosis of skin 02/21/2017    Past Surgical History:  Procedure Laterality Date   ABDOMINAL HYSTERECTOMY  2004   TONSILLECTOMY  10-12 yrs old    Family History  Problem Relation Age of Onset   Heart failure Mother        started in her 19's   Hypertension Mother     Hyperlipidemia Mother    Lung cancer Father    Hypertension Father    Hypercholesterolemia Father    Diabetes Sister        diet controlled   Dysrhythmia Sister        possible in the past   Healthy Daughter     Social History   Social History Narrative   Not on file     Review of Systems General: Denies fevers, chills, weight loss CV: Denies chest pain, shortness of breath, palpitations  Physical Exam Vitals with BMI 01/05/2021 12/12/2020 11/10/2020  Height 5\' 7"  5\' 7"  5\' 7"   Weight 93 lbs 89 lbs 94 lbs 2 oz  BMI 14.56 18.84 16.60  Systolic 630 160 109  Diastolic 74 75 74  Pulse 82 87 88    General:  No acute distress,  Alert and oriented, Non-Toxic, Normal speech and affect Examination shows approximately 2.5 x 2 cm external left nasal alar mass with some focal ulceration.  There is intranasal crusting.  No obvious lymphadenopathy.  No obvious scars in the forehead or cheek.  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  We will plan a coordinated excision with Dr. Constance Harris and subsequent reconstruction.  I did explain given the size of it I would expect that she would require a forehead flap reconstruction with cartilage graft for support.  I showed her pictures of this and we discussed that this is a staged procedure.  We reviewed risks include bleeding, infection, damage to surrounding structures need for additional procedures.  We also discussed the possibility of a delayed reconstruction to wait for pathology depending on how comfortable we were with the margins.  Patient is fully understanding and will discuss with Dr. Constance Harris and move forward to coordinate a good plan for her.  The patient does smoke regularly and I did explain the increased risk associated with that in terms of healing particularly with flaps and tissue transfer.  She is understanding and will do her best to cut back.  Melanie Harris 01/05/2021, 1:10 PM

## 2021-01-12 NOTE — H&P (Signed)
HPI:   Melanie Harris is a 60 y.o. female who presents as a new Patient.   Referring Provider: Self, A Referral  Chief complaint: Nasal mass.  HPI: He has had a mass inside and outside the left nasal cavity for several months. She has been treated with various medications including antibiotics and steroids. It has never gone away but has gotten smaller in the past. Otherwise in good health.  PMH/Meds/All/SocHx/FamHx/ROS:   History reviewed. No pertinent past medical history.  Past Surgical History:  Procedure Laterality Date   HYSTERECTOMY   TONSILLECTOMY   No family history of bleeding disorders, wound healing problems or difficulty with anesthesia.   Social History   Socioeconomic History   Marital status: Divorced  Spouse name: Not on file   Number of children: Not on file   Years of education: Not on file   Highest education level: Not on file  Occupational History   Not on file  Tobacco Use   Smoking status: Current Every Day Smoker  Packs/day: 0.25   Smokeless tobacco: Never Used  Vaping Use   Vaping Use: Never used  Substance and Sexual Activity   Alcohol use: Not on file   Drug use: Not on file   Sexual activity: Not on file  Other Topics Concern   Not on file  Social History Narrative   Not on file   Social Determinants of Health   Financial Resource Strain: Not on file  Food Insecurity: Not on file  Transportation Needs: Not on file  Physical Activity: Not on file  Stress: Not on file  Social Connections: Not on file  Housing Stability: Not on file   Current Outpatient Medications:   amLODIPine (NORVASC) 10 MG tablet, Take 10 mg by mouth daily., Disp: , Rfl:   atorvastatin (LIPITOR) 20 MG tablet, Take 20 mg by mouth daily., Disp: , Rfl:   ergocalciferol, vitamin D2, 50 mcg (2,000 unit) Cap, Take 2,000 Units by mouth daily., Disp: , Rfl:   gabapentin (NEURONTIN) 300 MG capsule, Take 300 mg by mouth., Disp: , Rfl:   metoPROLOL succinate  (TOPROL-XL) 100 MG 24 hr tablet, Take 100 mg by mouth daily., Disp: , Rfl:   buPROPion XL (WELLBUTRIN XL) 150 MG 24 hr tablet, Take 150 mg by mouth., Disp: , Rfl:   A complete ROS was performed with pertinent positives/negatives noted in the HPI. The remainder of the ROS are negative.   Physical Exam:   BP 122/63  Pulse 80  Temp 96.8 F (36 C)  Ht 1.702 m (5\' 7" )  Wt (!) 41.7 kg (92 lb)  BMI 14.41 kg/m   General: Healthy and alert, in no distress, breathing easily. Normal affect. In a pleasant mood. Head: Normocephalic, atraumatic. No masses, or scars. Eyes: Pupils are equal, and reactive to light. Vision is grossly intact. No spontaneous or gaze nystagmus. Ears: Ear canals are clear. Tympanic membranes are intact, with normal landmarks and the middle ears are clear and healthy. Hearing: Grossly normal. Nose: Nasal exam is normal on the right with a 2 cm mass involving the left nasal ala and continuing into the nasal cavity with a partially obstructing papillomatous mass. Posterior to that appears to be clear. Face: No masses or scars, facial nerve function is symmetric. Oral Cavity: No mucosal abnormalities are noted. Tongue with normal mobility. Dentition appears healthy. Oropharynx: Tonsils are symmetric. There are no mucosal masses identified. Tongue base appears normal and healthy. Larynx/Hypopharynx: deferred Chest: Deferred Neck: No palpable masses, no  cervical adenopathy, right thyroid lobe soft but diffusely enlarged. Neuro: Cranial nerves II-XII with normal function. Balance: Normal gate. Other findings: none.  Independent Review of Additional Tests or Records:  none  Procedures:  Nasal biopsy:  Left nasal cavity mass infiltrated with local anesthetic solution, 1% Xylocaine with epinephrine. Biopsy forceps used to remove a small fragment of the intranasal growth. This was sent for pathologic evaluation. Silver nitrate cautery was used. He tolerated this  well.  Impression & Plans:  Nasal mass, biopsy taken today. We will discuss results when available.  Thyroid goiter. She had an ultrasound several years ago. She has not had any follow-up since then. Recommend we do an ultrasound to start with.

## 2021-01-14 NOTE — Progress Notes (Signed)
Surgical Instructions    Your procedure is scheduled on Dec. 14.  Report to [facility] Main Entrance "A" at 8:00 A.M., then check in with the Admitting office.  Call this number if you have problems the morning of surgery:  [phone]   If you have any questions prior to your surgery date call [phone]: Open Monday-Friday 8am-4pm    Remember:  Do not eat or drink after midnight the night before your surgery      Take these medicines the morning of surgery with A SIP OF WATER :              Amlodipine (norvasc)             Atorvastatin (lipitor)             Bupropion (wellbutrin)             Metoprolol succinate (toprol-xl)   As of today, STOP taking any Aspirin (unless otherwise instructed by your surgeon) Aleve, Naproxen, Ibuprofen, Motrin, Advil, Goody's, BC's, all herbal medications, fish oil, and all vitamins.     After your COVID test   You are not required to quarantine however you are required to wear a well-fitting mask when you are out and around people not in your household.  If your mask becomes wet or soiled, replace with a new one.  Wash your hands often with soap and water for 20 seconds or clean your hands with an alcohol-based hand sanitizer that contains at least 60% alcohol.  Do not share personal items.  Notify your provider: if you are in close contact with someone who has COVID  or if you develop a fever of 100.4 or greater, sneezing, cough, sore throat, shortness of breath or body aches.             Do not wear jewelry or makeup Do not wear lotions, powders, perfumes/colognes, or deodorant. Do not shave 48 hours prior to surgery.  Men may shave face and neck. Do not bring valuables to the hospital. DO Not wear nail polish, gel polish, artificial nails, or any other type of covering on natural nails including finger and toenails. If patients have artificial nails, gel coating, etc. that need to be removed by a nail salon, please have this  removed prior to surgery or surgery may need to be canceled/delayed if the surgeon/ anesthesia feels like the patient is unable to be adequately monitored.             [facility] is not responsible for any belongings or valuables.  Do NOT Smoke (Tobacco/Vaping)  24 hours prior to your procedure  If you use a CPAP at night, you may bring your mask for your overnight stay.   Contacts, glasses, hearing aids, dentures or partials may not be worn into surgery, please bring cases for these belongings   For patients admitted to the hospital, discharge time will be determined by your treatment team.   Patients discharged the day of surgery will not be allowed to drive home, and someone needs to stay with them for 24 hours.  NO VISITORS WILL BE ALLOWED IN PRE-OP WHERE PATIENTS ARE PREPPED FOR SURGERY.  ONLY 1 SUPPORT PERSON MAY BE PRESENT IN THE WAITING ROOM WHILE YOU ARE IN SURGERY.  IF YOU ARE TO BE ADMITTED, ONCE YOU ARE IN YOUR ROOM YOU WILL BE ALLOWED TWO (2) VISITORS. 1 (ONE) VISITOR MAY STAY OVERNIGHT BUT MUST ARRIVE TO THE ROOM BY 8pm.  Minor children  may have two parents present. Special consideration for safety and communication needs will be reviewed on a case by case basis.  Special instructions:    Oral Hygiene is also important to reduce your risk of infection.  Remember - BRUSH YOUR TEETH THE MORNING OF SURGERY WITH YOUR REGULAR TOOTHPASTE   [facility]- Preparing For Surgery  Before surgery, you can play an important role. Because skin is not sterile, your skin needs to be as free of germs as possible. You can reduce the number of germs on your skin by washing with CHG (chlorahexidine gluconate) Soap before surgery.  CHG is an antiseptic cleaner which kills germs and bonds with the skin to continue killing germs even after washing.     Please do not use if you have an allergy to CHG or antibacterial soaps. If your skin becomes reddened/irritated stop using the CHG.  Do not shave  (including legs and underarms) for at least 48 hours prior to first CHG shower. It is OK to shave your face.  Please follow these instructions carefully.     Shower the NIGHT BEFORE SURGERY and the MORNING OF SURGERY with CHG Soap.   If you chose to wash your hair, wash your hair first as usual with your normal shampoo. After you shampoo, rinse your hair and body thoroughly to remove the shampoo.  Then [employer] and genitals (private parts) with your normal soap and rinse thoroughly to remove soap.  After that Use CHG Soap as you would any other liquid soap. You can apply CHG directly to the skin and wash gently with a scrungie or a clean washcloth.   Apply the CHG Soap to your body ONLY FROM THE NECK DOWN.  Do not use on open wounds or open sores. Avoid contact with your eyes, ears, mouth and genitals (private parts). Wash Face and genitals (private parts)  with your normal soap.   Wash thoroughly, paying special attention to the area where your surgery will be performed.  Thoroughly rinse your body with warm water from the neck down.  DO NOT shower/wash with your normal soap after using and rinsing off the CHG Soap.  Pat yourself dry with a CLEAN TOWEL.  Wear CLEAN PAJAMAS to bed the night before surgery  Place CLEAN SHEETS on your bed the night before your surgery  DO NOT SLEEP WITH PETS.   Day of Surgery:  Take a shower with CHG soap. Wear Clean/Comfortable clothing the morning of surgery Do not apply any deodorants/lotions.   Remember to brush your teeth WITH YOUR REGULAR TOOTHPASTE.   Please read over the following fact sheets that you were given.

## 2021-01-16 ENCOUNTER — Other Ambulatory Visit: Payer: Self-pay

## 2021-01-16 ENCOUNTER — Encounter (HOSPITAL_COMMUNITY): Payer: Self-pay

## 2021-01-16 ENCOUNTER — Encounter (HOSPITAL_COMMUNITY)
Admission: RE | Admit: 2021-01-16 | Discharge: 2021-01-16 | Disposition: A | Discharge status: Home / Self Care | Payer: No Typology Code available for payment source | Source: Ambulatory Visit | Attending: Otolaryngology | Admitting: Otolaryngology

## 2021-01-16 DIAGNOSIS — Z01818 Encounter for other preprocedural examination: Secondary | ICD-10-CM | POA: Insufficient documentation

## 2021-01-16 LAB — BASIC METABOLIC PANEL
Anion gap: 13 (ref 5–15)
BUN: 17 mg/dL (ref 6–20)
CO2: 27 mmol/L (ref 22–32)
Calcium: 10.1 mg/dL (ref 8.9–10.3)
Chloride: 99 mmol/L (ref 98–111)
Creatinine, Ser: 0.67 mg/dL (ref 0.44–1.00)
GFR, Estimated: >60 mL/min (ref >60)
Glucose, Bld: 89 mg/dL (ref 70–99)
Potassium: 3.7 mmol/L (ref 3.5–5.1)
Sodium: 139 mmol/L (ref 135–145)

## 2021-01-16 LAB — CBC
HCT: 40 % (ref 36.0–46.0)
Hemoglobin: 13.4 g/dL (ref 12.0–15.0)
MCH: 33.2 pg (ref 26.0–34.0)
MCHC: 33.5 g/dL (ref 30.0–36.0)
MCV: 99 fL (ref 80.0–100.0)
Platelets: 317 10*3/uL (ref 150–400)
RBC: 4.04 MIL/uL (ref 3.87–5.11)
RDW: 13.5 % (ref 11.5–15.5)
WBC: 9 10*3/uL (ref 4.0–10.5)
nRBC: 0 % (ref 0.0–0.2)

## 2021-01-16 NOTE — Progress Notes (Signed)
PCP - Dr. Margarita Rana Cardiologist - Dr. Lovena Le  Chest x-ray -  EKG - 01/16/21 Stress Test -  ECHO -  Cardiac Cath -   Blood Thinner Instructions:  Aspirin Instructions:   ERAS Protcol -  PRE-SURGERY Ensure or G2-   COVID TEST- n/a   Anesthesia review: n/a  Patient denies shortness of breath, fever, cough and chest pain at PAT appointment   All instructions explained to the patient, with a verbal understanding of the material. Patient agrees to go over the instructions while at home for a better understanding. Patient also instructed to self quarantine after being tested for COVID-19. The opportunity to ask questions was provided.

## 2021-01-18 ENCOUNTER — Encounter (HOSPITAL_COMMUNITY): Payer: Self-pay | Admitting: Otolaryngology

## 2021-01-18 ENCOUNTER — Ambulatory Visit (HOSPITAL_COMMUNITY): Payer: No Typology Code available for payment source | Admitting: Certified Registered Nurse Anesthetist

## 2021-01-18 ENCOUNTER — Ambulatory Visit (HOSPITAL_COMMUNITY): Payer: No Typology Code available for payment source | Admitting: Vascular Surgery

## 2021-01-18 ENCOUNTER — Encounter (HOSPITAL_COMMUNITY)
Admission: RE | Disposition: A | Discharge status: Home / Self Care | Payer: Self-pay | Source: Home / Self Care | Attending: Otolaryngology

## 2021-01-18 ENCOUNTER — Ambulatory Visit (HOSPITAL_COMMUNITY)
Admission: RE | Admit: 2021-01-18 | Discharge: 2021-01-18 | Disposition: A | Discharge status: Home / Self Care | Payer: No Typology Code available for payment source | Attending: Otolaryngology | Admitting: Otolaryngology

## 2021-01-18 ENCOUNTER — Other Ambulatory Visit: Payer: Self-pay

## 2021-01-18 DIAGNOSIS — I340 Nonrheumatic mitral (valve) insufficiency: Secondary | ICD-10-CM | POA: Insufficient documentation

## 2021-01-18 DIAGNOSIS — F17200 Nicotine dependence, unspecified, uncomplicated: Secondary | ICD-10-CM | POA: Insufficient documentation

## 2021-01-18 DIAGNOSIS — C300 Malignant neoplasm of nasal cavity: Secondary | ICD-10-CM | POA: Insufficient documentation

## 2021-01-18 DIAGNOSIS — C44321 Squamous cell carcinoma of skin of nose: Secondary | ICD-10-CM | POA: Insufficient documentation

## 2021-01-18 HISTORY — PX: RHINOPLASTY: SHX2354

## 2021-01-18 SURGERY — RHINOPLASTY
Anesthesia: General | Laterality: Left

## 2021-01-18 MED ORDER — ACETAMINOPHEN 500 MG PO TABS
1000.0000 mg | ORAL_TABLET | Freq: Once | ORAL | Status: AC
  Administered 2021-01-18: 09:00:00 1000 mg via ORAL
  Filled 2021-01-18: qty 2

## 2021-01-18 MED ORDER — MIDAZOLAM HCL 2 MG/2ML IJ SOLN
INTRAMUSCULAR | Status: DC | PRN
  Administered 2021-01-18: 2 mg via INTRAVENOUS

## 2021-01-18 MED ORDER — CHLORHEXIDINE GLUCONATE 0.12 % MT SOLN
15.0000 mL | Freq: Once | OROMUCOSAL | Status: AC
  Administered 2021-01-18: 08:00:00 15 mL via OROMUCOSAL
  Filled 2021-01-18: qty 15

## 2021-01-18 MED ORDER — ROCURONIUM BROMIDE 10 MG/ML (PF) SYRINGE
PREFILLED_SYRINGE | INTRAVENOUS | Status: DC | PRN
  Administered 2021-01-18: 50 mg via INTRAVENOUS

## 2021-01-18 MED ORDER — DEXAMETHASONE SODIUM PHOSPHATE 10 MG/ML IJ SOLN
INTRAMUSCULAR | Status: DC | PRN
  Administered 2021-01-18: 8 mg via INTRAVENOUS

## 2021-01-18 MED ORDER — PROMETHAZINE HCL 25 MG RE SUPP
25.0000 mg | Freq: Four times a day (QID) | RECTAL | 1 refills | Status: DC | PRN
  Filled 2021-01-18: qty 12, 3d supply, fill #0

## 2021-01-18 MED ORDER — LACTATED RINGERS IV SOLN: INTRAVENOUS | Status: DC

## 2021-01-18 MED ORDER — AMISULPRIDE (ANTIEMETIC) 5 MG/2ML IV SOLN
10.0000 mg | Freq: Once | INTRAVENOUS | Status: AC | PRN
  Administered 2021-01-18: 12:00:00 10 mg via INTRAVENOUS

## 2021-01-18 MED ORDER — 0.9 % SODIUM CHLORIDE (POUR BTL) OPTIME
TOPICAL | Status: DC | PRN
  Administered 2021-01-18: 10:00:00 500 mL

## 2021-01-18 MED ORDER — HYDROCODONE-ACETAMINOPHEN 7.5-325 MG PO TABS
1.0000 | ORAL_TABLET | Freq: Four times a day (QID) | ORAL | 0 refills | Status: DC | PRN
  Filled 2021-01-18: qty 20, 5d supply, fill #0

## 2021-01-18 MED ORDER — PROPOFOL 10 MG/ML IV BOLUS
INTRAVENOUS | Status: DC | PRN
  Administered 2021-01-18: 100 mg via INTRAVENOUS

## 2021-01-18 MED ORDER — CEFAZOLIN SODIUM-DEXTROSE 2-4 GM/100ML-% IV SOLN
INTRAVENOUS | Status: AC
  Filled 2021-01-18: qty 100

## 2021-01-18 MED ORDER — FENTANYL CITRATE (PF) 250 MCG/5ML IJ SOLN
INTRAMUSCULAR | Status: AC
  Filled 2021-01-18: qty 5

## 2021-01-18 MED ORDER — AMISULPRIDE (ANTIEMETIC) 5 MG/2ML IV SOLN
INTRAVENOUS | Status: AC
  Filled 2021-01-18: qty 4

## 2021-01-18 MED ORDER — OXYMETAZOLINE HCL 0.05 % NA SOLN
NASAL | Status: AC
  Filled 2021-01-18: qty 30

## 2021-01-18 MED ORDER — ONDANSETRON HCL 4 MG/2ML IJ SOLN
INTRAMUSCULAR | Status: DC | PRN
  Administered 2021-01-18: 4 mg via INTRAVENOUS

## 2021-01-18 MED ORDER — FENTANYL CITRATE (PF) 100 MCG/2ML IJ SOLN: 25.0000 ug | INTRAMUSCULAR | Status: DC | PRN

## 2021-01-18 MED ORDER — LIDOCAINE-EPINEPHRINE 1 %-1:100000 IJ SOLN
INTRAMUSCULAR | Status: DC | PRN
  Administered 2021-01-18: 10 mL

## 2021-01-18 MED ORDER — FENTANYL CITRATE (PF) 250 MCG/5ML IJ SOLN
INTRAMUSCULAR | Status: DC | PRN
  Administered 2021-01-18: 100 ug via INTRAVENOUS

## 2021-01-18 MED ORDER — ORAL CARE MOUTH RINSE: 15.0000 mL | Freq: Once | OROMUCOSAL | Status: AC

## 2021-01-18 MED ORDER — MIDAZOLAM HCL 2 MG/2ML IJ SOLN
INTRAMUSCULAR | Status: AC
  Filled 2021-01-18: qty 2

## 2021-01-18 MED ORDER — BACITRACIN ZINC 500 UNIT/GM EX OINT
TOPICAL_OINTMENT | CUTANEOUS | Status: AC
  Filled 2021-01-18: qty 28.35

## 2021-01-18 MED ORDER — LIDOCAINE-EPINEPHRINE 1 %-1:100000 IJ SOLN
INTRAMUSCULAR | Status: AC
  Filled 2021-01-18: qty 1

## 2021-01-18 MED ORDER — PHENYLEPHRINE HCL (PRESSORS) 10 MG/ML IV SOLN
INTRAVENOUS | Status: DC | PRN
  Administered 2021-01-18 (×2): 80 ug via INTRAVENOUS

## 2021-01-18 SURGICAL SUPPLY — 34 items
BAG COUNTER SPONGE SURGICOUNT (BAG) ×3 IMPLANT
BAG SPNG CNTER NS LX DISP (BAG) ×1
BLADE SURG 15 STRL LF DISP TIS (BLADE) IMPLANT
BLADE SURG 15 STRL SS (BLADE) ×2
CANISTER SUCT 3000ML PPV (MISCELLANEOUS) ×3 IMPLANT
CLEANER TIP ELECTROSURG 2X2 (MISCELLANEOUS) ×1 IMPLANT
CNTNR URN SCR LID CUP LEK RST (MISCELLANEOUS) IMPLANT
COAGULATOR SUCT SWTCH 10FR 6 (ELECTROSURGICAL) IMPLANT
CONT SPEC 4OZ STRL OR WHT (MISCELLANEOUS) ×2
DRAPE HALF SHEET 40X57 (DRAPES) IMPLANT
DRSG TELFA 3X8 NADH (GAUZE/BANDAGES/DRESSINGS) ×2 IMPLANT
ELECT COATED BLADE 2.86 ST (ELECTRODE) ×1 IMPLANT
GAUZE SPONGE 2X2 8PLY STRL LF (GAUZE/BANDAGES/DRESSINGS) ×2 IMPLANT
GAUZE XEROFORM 5X9 LF (GAUZE/BANDAGES/DRESSINGS) ×1 IMPLANT
GLOVE SURG LTX SZ7.5 (GLOVE) ×3 IMPLANT
GOWN STRL REUS W/ TWL LRG LVL3 (GOWN DISPOSABLE) ×4 IMPLANT
GOWN STRL REUS W/TWL LRG LVL3 (GOWN DISPOSABLE) ×4
KIT BASIN OR (CUSTOM PROCEDURE TRAY) ×3 IMPLANT
KIT TURNOVER KIT B (KITS) ×3 IMPLANT
NDL PRECISIONGLIDE 27X1.5 (NEEDLE) ×2 IMPLANT
NEEDLE PRECISIONGLIDE 27X1.5 (NEEDLE) ×2 IMPLANT
NS IRRIG 1000ML POUR BTL (IV SOLUTION) ×3 IMPLANT
PAD ARMBOARD 7.5X6 YLW CONV (MISCELLANEOUS) ×6 IMPLANT
PAD DRESSING TELFA 3X8 NADH (GAUZE/BANDAGES/DRESSINGS) ×2 IMPLANT
PATTIES SURGICAL .5 X3 (DISPOSABLE) ×3 IMPLANT
PENCIL FOOT CONTROL (ELECTRODE) ×1 IMPLANT
POSITIONER HEAD DONUT 9IN (MISCELLANEOUS) ×3 IMPLANT
SPONGE GAUZE 2X2 STER 10/PKG (GAUZE/BANDAGES/DRESSINGS) ×1
SUT PLAIN 4 0 ~~LOC~~ 1 (SUTURE) ×3 IMPLANT
SUT SILK 3 0 SH CR/8 (SUTURE) ×1 IMPLANT
SUT SILK 4 0 PS 2 (SUTURE) ×1 IMPLANT
TOWEL GREEN STERILE FF (TOWEL DISPOSABLE) ×3 IMPLANT
TRAY ENT MC OR (CUSTOM PROCEDURE TRAY) ×3 IMPLANT
WATER STERILE IRR 1000ML POUR (IV SOLUTION) ×3 IMPLANT

## 2021-01-18 NOTE — Op Note (Addendum)
OPERATIVE REPORT  DATE OF SURGERY: 01/18/2021  PATIENT:  Melanie Harris,  60 y.o. female  PRE-OPERATIVE DIAGNOSIS:  Nasal mass  POST-OPERATIVE DIAGNOSIS:  Nasal mass  PROCEDURE:  Procedure(s): PARTIAL RHINECTOMY WITH FROZEN SECTION  SURGEON:  Beckie Salts, MD  ASSISTANTS: None  ANESTHESIA:   General   EBL: 30 ml  DRAINS: None  LOCAL MEDICATIONS USED: 1% Xylocaine with epinephrine  SPECIMEN: Left partial rhinectomy with all margins identified for frozen section, including the septal margin in the upper lip margin, all were negative.  COUNTS:  Correct  PROCEDURE DETAILS: The patient was taken to the operating room and placed on the operating table in the supine position. Following induction of general endotracheal anesthesia, the face was prepped and draped in a standard fashion.  The tumor was identified in the left nasal ala.  It appeared to have originated in the left nasal alar mucosa and grown through to the skin side of the nasal ala.  The lesion itself was approximately 2.5 cm.  Incisions were outlined with a marking pen.  The left nasolabial crease was used for the lateral incision.  The mid columella was used for the medial incision and then up through the midline tip of the nose angled over towards the left including adequate margins superior and inferiorly down into the skin into the upper lip.  Electrocautery was used to incise the skin and subcutaneous tissue.  Dissection continued down to the piriform aperture and into the nasal mucosa.  The inferior portion of the tumor was identified and adequate mucosal margins were taken including along the nasal septum.  The specimen was delivered and sent for frozen section analysis.  An additional lower margin of the upper lip was taken with sent for pathologic evaluation as well.  Electrocautery was used to provide hemostasis.  Small nasal trumpets were placed in both nasal cavities after having cut them down to size.  The  wound was then packed with Xeroform gauze and a bolster was applied with 8 x 3-0 silk to hold everything in place.  The patient was then awakened extubated and transferred to recovery in stable condition.    PATIENT DISPOSITION:  To PACU, stable

## 2021-01-18 NOTE — Transfer of Care (Signed)
Immediate Anesthesia Transfer of Care Note  Patient: Melanie Harris  Procedure(s) Performed: PARTIAL RHINECTOMY WITH FROZEN SECTION (Left)  Patient Location: PACU  Anesthesia Type:General  Level of Consciousness: awake, alert  and oriented  Airway & Oxygen Therapy: Patient Spontanous Breathing  Post-op Assessment: Report given to RN and Post -op Vital signs reviewed and stable  Post vital signs: Reviewed and stable  Last Vitals:  Vitals Value Taken Time  BP 129/74 01/18/21 1120  Temp 36.8 C 01/18/21 1120  Pulse 67 01/18/21 1123  Resp 13 01/18/21 1123  SpO2 100 % 01/18/21 1123  Vitals shown include unvalidated device data.  Last Pain:  Vitals:   01/18/21 0803  TempSrc:   PainSc: 5          Complications: No notable events documented.

## 2021-01-18 NOTE — Anesthesia Postprocedure Evaluation (Signed)
Anesthesia Post Note  Patient: Lissy Deuser  Procedure(s) Performed: PARTIAL RHINECTOMY WITH FROZEN SECTION (Left)     Patient location during evaluation: PACU Anesthesia Type: General Level of consciousness: awake and alert Pain management: pain level controlled Vital Signs Assessment: post-procedure vital signs reviewed and stable Respiratory status: spontaneous breathing, nonlabored ventilation, respiratory function stable and patient connected to nasal cannula oxygen Cardiovascular status: blood pressure returned to baseline and stable Postop Assessment: no apparent nausea or vomiting Anesthetic complications: no   No notable events documented.  Last Vitals:  Vitals:   01/18/21 1135 01/18/21 1150  BP: 133/62 137/63  Pulse: 74 77  Resp: 12 14  Temp:  36.8 C  SpO2: 99% 100%    Last Pain:  Vitals:   01/18/21 1150  TempSrc:   PainSc: 0-No pain                 Santiel Topper L Tifani Dack

## 2021-01-18 NOTE — Anesthesia Procedure Notes (Signed)
Procedure Name: Intubation Date/Time: 01/18/2021 10:02 AM Performed by: Minerva Ends, CRNA Pre-anesthesia Checklist: Patient identified, Emergency Drugs available, Suction available and Patient being monitored Patient Re-evaluated:Patient Re-evaluated prior to induction Oxygen Delivery Method: Circle system utilized Preoxygenation: Pre-oxygenation with 100% oxygen Induction Type: IV induction Ventilation: Mask ventilation without difficulty Laryngoscope Size: Glidescope, Mac and 3 Grade View: Grade III Tube type: Oral Number of attempts: 2 Airway Equipment and Method: Stylet Placement Confirmation: ETT inserted through vocal cords under direct vision, positive ETCO2 and breath sounds checked- equal and bilateral Secured at: 22 cm Tube secured with: Tape Dental Injury: Teeth and Oropharynx as per pre-operative assessment  Comments: Grade 3 view with DL Grade 1 with glidescope

## 2021-01-18 NOTE — Discharge Instructions (Signed)
OK to use nasal saline spray in the tubes if needed. Otherwise no need to do anything with the dressing.

## 2021-01-18 NOTE — Anesthesia Preprocedure Evaluation (Addendum)
Anesthesia Evaluation  Patient identified by MRN, date of birth, ID band Patient awake    Reviewed: Allergy & Precautions, NPO status , Patient's Chart, lab work & pertinent test results, reviewed documented beta blocker date and time   Airway Mallampati: III  TM Distance: >3 FB Neck ROM: Full  Mouth opening: Limited Mouth Opening  Dental  (+) Chipped, Dental Advisory Given,    Pulmonary COPD, Current SmokerPatient did not abstain from smoking.,    Pulmonary exam normal breath sounds clear to auscultation       Cardiovascular hypertension, Pt. on medications and Pt. on home beta blockers Normal cardiovascular exam+ dysrhythmias Supra Ventricular Tachycardia  Rhythm:Regular Rate:Normal  HLD  TTE 2019 - Left ventricle: The cavity size was normal. Wall thickness was  normal. Systolic function was normal. The estimated ejection  fraction was in the range of 60% to 65%. Left ventricular  diastolic function parameters were normal.  - Mitral valve: There was mild regurgitation.  - Atrial septum: No defect or patent foramen ovale was identified.  Holter Monitor 2019 Sinus rhythm Rare premature atrial contractions (70 during monitoring period) Short run of SVT (11 beats) Rare premature ventricular contractions (7 during monitoring period) 6 beat run of ventricular tachycardia.    Neuro/Psych PSYCHIATRIC DISORDERS Anxiety negative neurological ROS     GI/Hepatic negative GI ROS, Neg liver ROS,   Endo/Other  negative endocrine ROS  Renal/GU negative Renal ROS  negative genitourinary   Musculoskeletal negative musculoskeletal ROS (+)   Abdominal   Peds  Hematology negative hematology ROS (+)   Anesthesia Other Findings   Reproductive/Obstetrics                            Anesthesia Physical Anesthesia Plan  ASA: 2  Anesthesia Plan: General   Post-op Pain Management: Tylenol PO  (pre-op)   Induction: Intravenous  PONV Risk Score and Plan: 2 and Midazolam, Dexamethasone and Ondansetron  Airway Management Planned: Oral ETT  Additional Equipment:   Intra-op Plan:   Post-operative Plan: Extubation in OR  Informed Consent: I have reviewed the patients History and Physical, chart, labs and discussed the procedure including the risks, benefits and alternatives for the proposed anesthesia with the patient or authorized representative who has indicated his/her understanding and acceptance.     Dental advisory given  Plan Discussed with: CRNA  Anesthesia Plan Comments:         Anesthesia Quick Evaluation

## 2021-01-18 NOTE — Interval H&P Note (Signed)
History and Physical Interval Note:  01/18/2021 9:09 AM  Melanie Harris  has presented today for surgery, with the diagnosis of Nasal mass.  The various methods of treatment have been discussed with the patient and family. After consideration of risks, benefits and other options for treatment, the patient has consented to  Procedure(s): PARTIAL RHINECTOMY WITH FROZEN SECTION (N/A) as a surgical intervention.  The patient's history has been reviewed, patient examined, no change in status, stable for surgery.  I have reviewed the patient's chart and labs.  Questions were answered to the patient's satisfaction.     [name]

## 2021-01-19 ENCOUNTER — Encounter (HOSPITAL_COMMUNITY): Payer: Self-pay | Admitting: Otolaryngology

## 2021-01-20 LAB — SURGICAL PATHOLOGY

## 2021-01-26 ENCOUNTER — Other Ambulatory Visit: Payer: Self-pay

## 2021-01-26 ENCOUNTER — Encounter (HOSPITAL_COMMUNITY): Payer: Self-pay | Admitting: Plastic Surgery

## 2021-01-26 DIAGNOSIS — C44301 Unspecified malignant neoplasm of skin of nose: Secondary | ICD-10-CM | POA: Insufficient documentation

## 2021-01-26 NOTE — Progress Notes (Signed)
PCP - Dr. Margarita Rana Cardiologist - Dr. Angelena Form EKG - 01/16/21 Chest x-ray -  ECHO - 05/31/17 Cardiac Cath -  CPAP -   ERAS Protcol - n/a - clears and sips with meds 1045  COVID TEST- n/a  Anesthesia review: n/a  -------------  SDW INSTRUCTIONS:  Your procedure is scheduled on Friday 12/23. Please report to [facility] Main Entrance "A" at 11:45 A.M., and check in at the Admitting office. Call this number if you have problems the morning of surgery: [phone]   Remember: Do not eat  after midnight the night before your surgery  You may drink clear liquids until 10:45 AM the morning of your surgery.   Clear liquids allowed are: Water, Non-Citrus Juices (without pulp), Carbonated Beverages, Clear Tea, Black Coffee Only, and Gatorade   Medications to take morning of surgery with a sip of water include: Amlodipine (norvasc) Atorvastatin (lipitor) Bupropion (wellbutrin) Metoprolol succinate (toprol-xl)  As of today, STOP taking any Aspirin (unless otherwise instructed by your surgeon), Aleve, Naproxen, Ibuprofen, Motrin, Advil, Goody's, BC's, all herbal medications, fish oil, and all vitamins.    The Morning of Surgery Do not wear jewelry, make-up or nail polish. Do not wear lotions, powders, or perfumes or deodorant  Do not bring valuables to the hospital. [facility] is not responsible for any belongings or valuables.  If you are a smoker, DO NOT Smoke 24 hours prior to surgery  If you wear a CPAP at night please bring your mask the morning of surgery   Remember that you must have someone to transport you home after your surgery, and remain with you for 24 hours if you are discharged the same day.  Please bring cases for contacts, glasses, hearing aids, dentures or bridgework because it cannot be worn into surgery.   Patients discharged the day of surgery will not be allowed to drive home.   Please shower the NIGHT BEFORE/MORNING OF SURGERY (use antibacterial soap like  DIAL soap if possible). Wear comfortable clothes the morning of surgery. Oral Hygiene is also important to reduce your risk of infection.  Remember - BRUSH YOUR TEETH THE MORNING OF SURGERY WITH YOUR REGULAR TOOTHPASTE  Patient denies shortness of breath, fever, cough and chest pain.

## 2021-01-27 ENCOUNTER — Other Ambulatory Visit: Payer: Self-pay

## 2021-01-27 ENCOUNTER — Encounter (HOSPITAL_COMMUNITY): Payer: Self-pay | Admitting: Plastic Surgery

## 2021-01-27 ENCOUNTER — Encounter (HOSPITAL_COMMUNITY)
Admission: RE | Disposition: A | Discharge status: Home / Self Care | Payer: Self-pay | Source: Home / Self Care | Attending: Plastic Surgery

## 2021-01-27 ENCOUNTER — Ambulatory Visit (HOSPITAL_COMMUNITY): Payer: No Typology Code available for payment source | Admitting: Certified Registered Nurse Anesthetist

## 2021-01-27 ENCOUNTER — Ambulatory Visit (HOSPITAL_COMMUNITY)
Admission: RE | Admit: 2021-01-27 | Discharge: 2021-01-27 | Disposition: A | Discharge status: Home / Self Care | Payer: No Typology Code available for payment source | Attending: Plastic Surgery | Admitting: Plastic Surgery

## 2021-01-27 DIAGNOSIS — M950 Acquired deformity of nose: Secondary | ICD-10-CM

## 2021-01-27 DIAGNOSIS — R220 Localized swelling, mass and lump, head: Secondary | ICD-10-CM | POA: Insufficient documentation

## 2021-01-27 DIAGNOSIS — J3489 Other specified disorders of nose and nasal sinuses: Secondary | ICD-10-CM | POA: Insufficient documentation

## 2021-01-27 HISTORY — PX: NASAL FLAP ROTATION: SHX5366

## 2021-01-27 HISTORY — PX: NASAL RECONSTRUCTION: SHX2069

## 2021-01-27 HISTORY — PX: SKIN FULL THICKNESS GRAFT: SHX442

## 2021-01-27 SURGERY — RECONSTRUCTION, NOSE
Anesthesia: General | Site: Nose | Laterality: Left

## 2021-01-27 MED ORDER — SUGAMMADEX SODIUM 200 MG/2ML IV SOLN
INTRAVENOUS | Status: DC | PRN
  Administered 2021-01-27: 86.2 mg via INTRAVENOUS

## 2021-01-27 MED ORDER — CELECOXIB 200 MG PO CAPS
200.0000 mg | ORAL_CAPSULE | Freq: Once | ORAL | Status: AC
  Administered 2021-01-27: 12:00:00 200 mg via ORAL
  Filled 2021-01-27: qty 1

## 2021-01-27 MED ORDER — LIDOCAINE-EPINEPHRINE 1 %-1:100000 IJ SOLN
INTRAMUSCULAR | Status: AC
  Filled 2021-01-27: qty 1

## 2021-01-27 MED ORDER — ACETAMINOPHEN 500 MG PO TABS
1000.0000 mg | ORAL_TABLET | Freq: Once | ORAL | Status: AC
  Administered 2021-01-27: 12:00:00 1000 mg via ORAL
  Filled 2021-01-27: qty 2

## 2021-01-27 MED ORDER — ARTIFICIAL TEARS OPHTHALMIC OINT
TOPICAL_OINTMENT | OPHTHALMIC | Status: DC | PRN
  Administered 2021-01-27: 1 via OPHTHALMIC

## 2021-01-27 MED ORDER — LIDOCAINE 2% (20 MG/ML) 5 ML SYRINGE
INTRAMUSCULAR | Status: DC | PRN
  Administered 2021-01-27: 60 mg via INTRAVENOUS

## 2021-01-27 MED ORDER — LACTATED RINGERS IV SOLN: INTRAVENOUS | Status: DC

## 2021-01-27 MED ORDER — BACITRACIN ZINC 500 UNIT/GM EX OINT
TOPICAL_OINTMENT | CUTANEOUS | Status: AC
  Filled 2021-01-27: qty 28.35

## 2021-01-27 MED ORDER — FENTANYL CITRATE (PF) 250 MCG/5ML IJ SOLN
INTRAMUSCULAR | Status: DC | PRN
  Administered 2021-01-27: 100 ug via INTRAVENOUS
  Administered 2021-01-27 (×3): 50 ug via INTRAVENOUS

## 2021-01-27 MED ORDER — CHLORHEXIDINE GLUCONATE CLOTH 2 % EX PADS: 6.0000 | MEDICATED_PAD | Freq: Once | CUTANEOUS | Status: DC

## 2021-01-27 MED ORDER — ROCURONIUM BROMIDE 10 MG/ML (PF) SYRINGE
PREFILLED_SYRINGE | INTRAVENOUS | Status: DC | PRN
  Administered 2021-01-27: 20 mg via INTRAVENOUS
  Administered 2021-01-27: 40 mg via INTRAVENOUS
  Administered 2021-01-27: 60 mg via INTRAVENOUS
  Administered 2021-01-27: 20 mg via INTRAVENOUS

## 2021-01-27 MED ORDER — PROPOFOL 500 MG/50ML IV EMUL
INTRAVENOUS | Status: DC | PRN
  Administered 2021-01-27: 25 ug/kg/min via INTRAVENOUS

## 2021-01-27 MED ORDER — ORAL CARE MOUTH RINSE: 15.0000 mL | Freq: Once | OROMUCOSAL | Status: AC

## 2021-01-27 MED ORDER — BACITRACIN-NEOMYCIN-POLYMYXIN OINTMENT TUBE
TOPICAL_OINTMENT | CUTANEOUS | Status: AC
  Filled 2021-01-27: qty 14.17

## 2021-01-27 MED ORDER — LIDOCAINE 2% (20 MG/ML) 5 ML SYRINGE
INTRAMUSCULAR | Status: AC
  Filled 2021-01-27: qty 10

## 2021-01-27 MED ORDER — OXYMETAZOLINE HCL 0.05 % NA SOLN
NASAL | Status: AC
  Filled 2021-01-27: qty 30

## 2021-01-27 MED ORDER — DEXAMETHASONE SODIUM PHOSPHATE 10 MG/ML IJ SOLN
INTRAMUSCULAR | Status: DC | PRN
  Administered 2021-01-27: 10 mg via INTRAVENOUS

## 2021-01-27 MED ORDER — FENTANYL CITRATE (PF) 100 MCG/2ML IJ SOLN
25.0000 ug | INTRAMUSCULAR | Status: DC | PRN
Start: 2021-01-27 — End: 2021-01-28

## 2021-01-27 MED ORDER — MIDAZOLAM HCL 2 MG/2ML IJ SOLN
INTRAMUSCULAR | Status: AC
  Filled 2021-01-27: qty 2

## 2021-01-27 MED ORDER — DEXMEDETOMIDINE (PRECEDEX) IN NS 20 MCG/5ML (4 MCG/ML) IV SYRINGE
PREFILLED_SYRINGE | INTRAVENOUS | Status: DC | PRN
  Administered 2021-01-27 (×2): 10 ug via INTRAVENOUS

## 2021-01-27 MED ORDER — PROMETHAZINE HCL 25 MG/ML IJ SOLN
6.2500 mg | INTRAMUSCULAR | Status: DC | PRN
Start: 2021-01-27 — End: 2021-01-28
  Administered 2021-01-27: 19:00:00 6.25 mg via INTRAVENOUS

## 2021-01-27 MED ORDER — LIDOCAINE-EPINEPHRINE 1 %-1:100000 IJ SOLN
INTRAMUSCULAR | Status: DC | PRN
  Administered 2021-01-27: 60 mL

## 2021-01-27 MED ORDER — DEXMEDETOMIDINE (PRECEDEX) IN NS 20 MCG/5ML (4 MCG/ML) IV SYRINGE
PREFILLED_SYRINGE | INTRAVENOUS | Status: AC
  Filled 2021-01-27: qty 5

## 2021-01-27 MED ORDER — OXYCODONE HCL 5 MG/5ML PO SOLN: 5.0000 mg | Freq: Once | ORAL | Status: DC | PRN

## 2021-01-27 MED ORDER — BACITRACIN ZINC 500 UNIT/GM EX OINT
TOPICAL_OINTMENT | CUTANEOUS | Status: DC | PRN
  Administered 2021-01-27: 1 via TOPICAL

## 2021-01-27 MED ORDER — FENTANYL CITRATE (PF) 250 MCG/5ML IJ SOLN
INTRAMUSCULAR | Status: AC
  Filled 2021-01-27: qty 5

## 2021-01-27 MED ORDER — CHLORHEXIDINE GLUCONATE 0.12 % MT SOLN
15.0000 mL | Freq: Once | OROMUCOSAL | Status: AC
  Administered 2021-01-27: 11:00:00 15 mL via OROMUCOSAL
  Filled 2021-01-27: qty 15

## 2021-01-27 MED ORDER — PROPOFOL 10 MG/ML IV BOLUS
INTRAVENOUS | Status: DC | PRN
  Administered 2021-01-27: 90 mg via INTRAVENOUS

## 2021-01-27 MED ORDER — CEFAZOLIN SODIUM-DEXTROSE 2-4 GM/100ML-% IV SOLN
2.0000 g | INTRAVENOUS | Status: AC
  Administered 2021-01-27: 14:00:00 2 g via INTRAVENOUS
  Filled 2021-01-27: qty 100

## 2021-01-27 MED ORDER — DEXAMETHASONE SODIUM PHOSPHATE 10 MG/ML IJ SOLN
INTRAMUSCULAR | Status: AC
  Filled 2021-01-27: qty 2

## 2021-01-27 MED ORDER — PROMETHAZINE HCL 25 MG/ML IJ SOLN
INTRAMUSCULAR | Status: AC
  Filled 2021-01-27: qty 1

## 2021-01-27 MED ORDER — PHENYLEPHRINE HCL-NACL 20-0.9 MG/250ML-% IV SOLN
INTRAVENOUS | Status: DC | PRN
  Administered 2021-01-27: 10 ug/min via INTRAVENOUS

## 2021-01-27 MED ORDER — MIDAZOLAM HCL 2 MG/2ML IJ SOLN
INTRAMUSCULAR | Status: DC | PRN
  Administered 2021-01-27: 2 mg via INTRAVENOUS

## 2021-01-27 MED ORDER — ONDANSETRON HCL 4 MG/2ML IJ SOLN
INTRAMUSCULAR | Status: DC | PRN
  Administered 2021-01-27 (×2): 4 mg via INTRAVENOUS

## 2021-01-27 MED ORDER — ONDANSETRON HCL 4 MG/2ML IJ SOLN
INTRAMUSCULAR | Status: AC
  Filled 2021-01-27: qty 4

## 2021-01-27 MED ORDER — ROCURONIUM BROMIDE 10 MG/ML (PF) SYRINGE
PREFILLED_SYRINGE | INTRAVENOUS | Status: AC
  Filled 2021-01-27: qty 20

## 2021-01-27 MED ORDER — HYDROCODONE-ACETAMINOPHEN 5-325 MG PO TABS
1.0000 | ORAL_TABLET | ORAL | 0 refills | Status: DC | PRN
Start: 2021-01-27 — End: 2021-02-02

## 2021-01-27 MED ORDER — HEMOSTATIC AGENTS (NO CHARGE) OPTIME
TOPICAL | Status: DC | PRN
  Administered 2021-01-27: 1 via TOPICAL

## 2021-01-27 MED ORDER — ONDANSETRON HCL 4 MG/2ML IJ SOLN
INTRAMUSCULAR | Status: AC
  Filled 2021-01-27: qty 2

## 2021-01-27 MED ORDER — OXYCODONE HCL 5 MG PO TABS: 5.0000 mg | ORAL_TABLET | Freq: Once | ORAL | Status: DC | PRN

## 2021-01-27 SURGICAL SUPPLY — 78 items
ADH SKN CLS APL DERMABOND .7 (GAUZE/BANDAGES/DRESSINGS)
BAG COUNTER SPONGE SURGICOUNT (BAG) IMPLANT
BAG SPNG CNTER NS LX DISP (BAG)
BAG SURGICOUNT SPONGE COUNTING (BAG)
BALL CTTN LRG ABS STRL LF (GAUZE/BANDAGES/DRESSINGS)
BLADE HEX COATED 2.75 (ELECTRODE) IMPLANT
BLADE SURG 15 STRL LF DISP TIS (BLADE) ×2 IMPLANT
BLADE SURG 15 STRL SS (BLADE) ×15
BNDG GAUZE ELAST 4 BULKY (GAUZE/BANDAGES/DRESSINGS) IMPLANT
CANISTER SUCT 1200ML W/VALVE (MISCELLANEOUS) ×4 IMPLANT
CLOSURE WOUND 1/2 X4 (GAUZE/BANDAGES/DRESSINGS)
COTTONBALL LRG STERILE PKG (GAUZE/BANDAGES/DRESSINGS) IMPLANT
COVER SURGICAL LIGHT HANDLE (MISCELLANEOUS) ×4 IMPLANT
DECANTER SPIKE VIAL GLASS SM (MISCELLANEOUS) ×4 IMPLANT
DERMABOND ADVANCED (GAUZE/BANDAGES/DRESSINGS)
DERMABOND ADVANCED .7 DNX12 (GAUZE/BANDAGES/DRESSINGS) IMPLANT
ELECT COATED BLADE 2.86 ST (ELECTRODE) IMPLANT
ELECT NDL BLADE 2-5/6 (NEEDLE) ×2 IMPLANT
ELECT NEEDLE BLADE 2-5/6 (NEEDLE) ×3 IMPLANT
ELECT REM PT RETURN 9FT ADLT (ELECTROSURGICAL) ×3
ELECTRODE REM PT RTRN 9FT ADLT (ELECTROSURGICAL) ×2 IMPLANT
GAUZE 4X4 16PLY ~~LOC~~+RFID DBL (SPONGE) ×4 IMPLANT
GAUZE SPONGE 2X2 8PLY STRL LF (GAUZE/BANDAGES/DRESSINGS) IMPLANT
GAUZE SPONGE 4X4 12PLY STRL (GAUZE/BANDAGES/DRESSINGS) ×4 IMPLANT
GAUZE SPONGE 4X4 12PLY STRL LF (GAUZE/BANDAGES/DRESSINGS) ×4 IMPLANT
GAUZE VASELINE FOILPK 1/2 X 72 (GAUZE/BANDAGES/DRESSINGS) IMPLANT
GAUZE XEROFORM 1X8 LF (GAUZE/BANDAGES/DRESSINGS) ×2 IMPLANT
GAUZE XEROFORM 5X9 LF (GAUZE/BANDAGES/DRESSINGS) IMPLANT
GLOVE SRG 8 PF TXTR STRL LF DI (GLOVE) ×2 IMPLANT
GLOVE SURG ENC MOIS LTX SZ6.5 (GLOVE) IMPLANT
GLOVE SURG ENC TEXT LTX SZ7.5 (GLOVE) ×4 IMPLANT
GLOVE SURG MICRO LTX SZ7.5 (GLOVE) ×2 IMPLANT
GLOVE SURG POLYISO LF SZ6.5 (GLOVE) ×2 IMPLANT
GLOVE SURG UNDER LTX SZ8 (GLOVE) ×2 IMPLANT
GLOVE SURG UNDER POLY LF SZ6.5 (GLOVE) ×2 IMPLANT
GLOVE SURG UNDER POLY LF SZ8 (GLOVE) ×3
GOWN STRL REUS W/ TWL LRG LVL3 (GOWN DISPOSABLE) ×4 IMPLANT
GOWN STRL REUS W/TWL LRG LVL3 (GOWN DISPOSABLE) ×6
HEMOSTAT SURGICEL 2X14 (HEMOSTASIS) ×2 IMPLANT
KIT BASIN OR (CUSTOM PROCEDURE TRAY) ×4 IMPLANT
MARKER SKIN DUAL TIP RULER LAB (MISCELLANEOUS) IMPLANT
NDL BEVEL SYR 25X1 (NEEDLE) IMPLANT
NDL PRECISIONGLIDE 27X1.5 (NEEDLE) ×2 IMPLANT
NEEDLE BEVEL SYR 25X1 (NEEDLE) ×3 IMPLANT
NEEDLE PRECISIONGLIDE 27X1.5 (NEEDLE) ×3 IMPLANT
PACK EENT II TURBAN DRAPE (CUSTOM PROCEDURE TRAY) ×4 IMPLANT
PACK GENERAL/GYN (CUSTOM PROCEDURE TRAY) ×4 IMPLANT
PADDING CAST ABS 4INX4YD NS (CAST SUPPLIES) ×2
PADDING CAST ABS COTTON 4X4 ST (CAST SUPPLIES) IMPLANT
PATTIES SURGICAL .5 X3 (DISPOSABLE) IMPLANT
PENCIL SMOKE EVACUATOR (MISCELLANEOUS) ×4 IMPLANT
SLEEVE SCD COMPRESS KNEE MED (STOCKING) ×4 IMPLANT
SPLINT NASAL AIRWAY SILICONE (MISCELLANEOUS) ×2 IMPLANT
SPLINT NASAL DOYLE BI-VL (GAUZE/BANDAGES/DRESSINGS) IMPLANT
SPONGE GAUZE 2X2 8PLY STER LF (GAUZE/BANDAGES/DRESSINGS)
SPONGE GAUZE 2X2 8PLY STRL LF (GAUZE/BANDAGES/DRESSINGS) IMPLANT
SPONGE GAUZE 2X2 STER 10/PKG (GAUZE/BANDAGES/DRESSINGS) ×2
SPONGE T-LAP 18X18 ~~LOC~~+RFID (SPONGE) ×2 IMPLANT
STAPLER SKIN 35 WIDE (STAPLE) IMPLANT
STRIP CLOSURE SKIN 1/2X4 (GAUZE/BANDAGES/DRESSINGS) IMPLANT
SUT MNCRL 6-0 UNDY P1 1X18 (SUTURE) IMPLANT
SUT MNCRL AB 3-0 PS2 18 (SUTURE) IMPLANT
SUT MNCRL AB 4-0 PS2 18 (SUTURE) ×18 IMPLANT
SUT MON AB 4-0 PC3 18 (SUTURE) IMPLANT
SUT MON AB 5-0 P3 18 (SUTURE) IMPLANT
SUT MON AB 5-0 PS2 18 (SUTURE) ×6 IMPLANT
SUT MONOCRYL 6-0 P1 1X18 (SUTURE)
SUT PDS AB 3-0 SH 27 (SUTURE) ×2 IMPLANT
SUT PDS AB 4-0 RB1 27 (SUTURE) IMPLANT
SUT PLAIN GUT FAST 5-0 (SUTURE) ×4 IMPLANT
SUT PROLENE 5 0 P 3 (SUTURE) IMPLANT
SUT PROLENE 5 0 PS 2 (SUTURE) IMPLANT
SUT PROLENE 6 0 P 1 18 (SUTURE) IMPLANT
SUT VIC AB 5-0 PS2 18 (SUTURE) IMPLANT
SUT VICRYL 4-0 PS2 18IN ABS (SUTURE) IMPLANT
SYR BULB EAR ULCER 3OZ GRN STR (SYRINGE) ×4 IMPLANT
SYR CONTROL 10ML LL (SYRINGE) ×2 IMPLANT
TOWEL GREEN STERILE FF (TOWEL DISPOSABLE) ×4 IMPLANT

## 2021-01-27 NOTE — Interval H&P Note (Signed)
Patient seen and examined. Risks and benefits discussed. Proceed with surgery.

## 2021-01-27 NOTE — Anesthesia Preprocedure Evaluation (Addendum)
Anesthesia Evaluation  Patient identified by MRN, date of birth, ID band Patient awake    Reviewed: Allergy & Precautions, NPO status , Patient's Chart, lab work & pertinent test results  History of Anesthesia Complications Negative for: history of anesthetic complications  Airway Mallampati: II  TM Distance: >3 FB Neck ROM: Full    Dental  (+) Dental Advisory Given, Chipped   Pulmonary COPD, Current SmokerPatient did not abstain from smoking.,    Pulmonary exam normal        Cardiovascular hypertension, Pt. on medications and Pt. on home beta blockers Normal cardiovascular exam     Neuro/Psych PSYCHIATRIC DISORDERS Anxiety negative neurological ROS     GI/Hepatic negative GI ROS, Neg liver ROS,   Endo/Other  negative endocrine ROS  Renal/GU negative Renal ROS     Musculoskeletal negative musculoskeletal ROS (+)   Abdominal   Peds  Hematology negative hematology ROS (+)   Anesthesia Other Findings Underweight  Reproductive/Obstetrics                            Anesthesia Physical Anesthesia Plan  ASA: 2  Anesthesia Plan: General   Post-op Pain Management: Tylenol PO (pre-op) and Celebrex PO (pre-op)   Induction: Intravenous  PONV Risk Score and Plan: 2 and Treatment may vary due to age or medical condition, Ondansetron, Dexamethasone and Midazolam  Airway Management Planned: Oral ETT  Additional Equipment: None  Intra-op Plan:   Post-operative Plan: Extubation in OR  Informed Consent: I have reviewed the patients History and Physical, chart, labs and discussed the procedure including the risks, benefits and alternatives for the proposed anesthesia with the patient or authorized representative who has indicated his/her understanding and acceptance.     Dental advisory given  Plan Discussed with: CRNA and Anesthesiologist  Anesthesia Plan Comments:       Anesthesia  Quick Evaluation

## 2021-01-27 NOTE — Op Note (Signed)
Operative Note   DATE OF OPERATION: 01/27/2021  SURGICAL DEPARTMENT: Plastic Surgery  PREOPERATIVE DIAGNOSES: Left hemirhinectomy defect  POSTOPERATIVE DIAGNOSES:  same  PROCEDURE: 1.  Primary closure of small septal mucosal defect totaling 1 cm 2.  Left ear cartilage graft for septal cartilage reconstruction 3.  Left nasolabial flap for lateral nasal lining reconstruction 4.  Right ear cartilage graft for alar cartilage reconstruction 5.  Left paramedian forehead flap for external nasal reconstruction 6.  Right nasolabial flap for septal nasal lining reconstruction 7.  Full-thickness skin graft totaling 1.5 x 1.5 cm for upper lip and nasal floor reconstruction 8.  Surgical preparation for coverage of left hemirhinectomy defect totaling 4 x 5 cm  SURGEON: Talmadge Coventry, MD  ASSISTANT: Verdie Shire, PA The advanced practice practitioner (APP) assisted throughout the case.  The APP was essential in retraction and counter traction when needed to make the case progress smoothly.  This retraction and assistance made it possible to see the tissue planes for the procedure.  The assistance was needed for hemostasis, tissue re-approximation and closure of the incision site.   ANESTHESIA:  General.   COMPLICATIONS: None.   INDICATIONS FOR PROCEDURE:  The patient, Melanie Harris is a 60 y.o. female born on [birth year], is here for treatment of complex left hemicraniectomy defect MRN: [id number]  CONSENT:  Informed consent was obtained directly from the patient. Risks, benefits and alternatives were fully discussed. Specific risks including but not limited to bleeding, infection, hematoma, seroma, scarring, pain, contracture, asymmetry, wound healing problems, and need for further surgery were all discussed. The patient did have an ample opportunity to have questions answered to satisfaction.   DESCRIPTION OF PROCEDURE:  The patient was taken to the operating room. SCDs were placed and  antibiotics were given.  General anesthesia was administered.  The patient's operative site was prepped and draped in a sterile fashion. A time out was performed and all information was confirmed to be correct.  Started by evaluating the wound.  The entirety of the left ala and sidewall had been removed full-thickness.  There was exposed septal septal cartilage.  The majority of the inferior nasal septal cartilage had been removed as well.  There was a mucosal perforation into the right nare that totaled about 1 cm in size.  The nasal lining laterally had been taken back to the maxilla.  Started by debriding any devitalized or unhealthy appearing tissue.  The wound was irrigated and clamped with a moist Ray-Tec.  I started by doing a primary closure of the small septal mucosal wound.  This was done with interrupted 4-0 Monocryl sutures.  I then infiltrated around the left ear with lidocaine with epinephrine and harvested a left ear cartilage graft through a posterior regular sulcus incision.  That incision was closed with 4-0 Monocryl sutures.  The cartilage was then inset to reconstruct the septal cartilage that had been removed.  This was secured with 4-0 Monocryl sutures.  At this point I planned out a left nasolabial flap which I intended to use for nasal lining.  This was drawn out superiorly based.  Lidocaine with epinephrine was injected and the flap was elevated thinned distally and gradually getting thicker to encompass the pedicle.  This was turned over on itself and ended up being most useful for lateral and superior nasal lining reconstruction.  This was inset with 4-0 Monocryl sutures.  At this point for external coverage I elected to move forward with a forehead flap.  The  patient did have a short forehead but I felt this was my only option.  I did shave some of her hair knowing that we could laser that down the line and included it in the flap.  I planned out a 1.7 cm base over the supratrochlear  artery.  This was oriented obliquely and a skin paddle was planned based on the anticipated defect.  Lidocaine with epinephrine was injected and the flap was harvested with a 15 blade in the subfrontalis plane.  I did come down beneath the periosteum as I approached the superior orbital rim to avoid injury to the pedicle.  Surrounding skin from the donor site was undermined with tenotomy scissors then advanced and closed in layers with interrupted buried 3-0 PDS sutures and a running 4-0 Monocryl.  I did have to leave the area open in the upper forehead that was too tight to close.  I then harvested a right ear cartilage graft to help with the alar support.  This was done similarly through the posterior regular incision.  That incision was closed with 4-0 Monocryl as well.  The forehead flap was wrapped around on itself to reconstruct the most inferior aspect of the nasal lining.  Once this is then inset a small subcutaneous pocket was generated for the ear cartilage which was inset with 4-0 Monocryl sutures.  This gave reasonable alar support and was totally encompassed by the flaps.  Forehead flap was then mostly inset with 5-0 Monocryl sutures.  At this point there was some remaining septal cartilage graft that was still exposed.  As I wanted to sandwich this with vascularized tissue I elected to implant out a right-sided nasolabial flap to cover this last segment of cartilage graft to maintain adequate tip support.  It was drawn out and infiltrated with lidocaine with epinephrine and elevated in a similar fashion to the left side taking it thinned distally and thicker as moving more proximally.  This was able to be rotated into position to cover the remaining septal cartilage graft and was inset with 5-0 Monocryl sutures.  Both nasolabial flap defects were closed with interrupted 4-0 Monocryl sutures and running 5-0 fast gut.  At this point there was a small defect left on the nasal floor extending slightly onto  her upper lip.  To avoid further contracture in this area I elected to resurface this with a full-thickness skin graft.  A corresponding sized area was identified in the left upper neck approaching the posterior regular area.  This was infiltrated with lidocaine with epinephrine.  Full-thickness skin graft was harvested with a 15 blade and the donor site was closed with erupted buried 4-0 Monocryl running 5-0 fast gut.  Graft was then defatted and inset 5-0 Monocryl sutures.  Distant open the nare I placed a #7 French nasal trumpet which was trimmed to just extend past her reconstruction and no further.  This was secured in place with a couple 3-0 PDS sutures.  Surgicel was wrapped around the pedicle of the forehead flap and the remaining open areas were covered with Xeroform.  Patient tolerated the procedure well.  The patient tolerated the procedure well.  There were no complications. The patient was allowed to wake from anesthesia, extubated and taken to the recovery room in satisfactory condition.

## 2021-01-27 NOTE — Anesthesia Procedure Notes (Signed)
Procedure Name: Intubation Date/Time: 01/27/2021 1:45 PM Performed by: Minerva Ends, CRNA Pre-anesthesia Checklist: Patient identified, Emergency Drugs available, Suction available and Patient being monitored Patient Re-evaluated:Patient Re-evaluated prior to induction Oxygen Delivery Method: Circle system utilized Preoxygenation: Pre-oxygenation with 100% oxygen Induction Type: IV induction Ventilation: Mask ventilation without difficulty Laryngoscope Size: Mac and 3 Grade View: Grade II Tube type: Oral Tube size: 7.0 mm Number of attempts: 1 Airway Equipment and Method: Stylet Placement Confirmation: ETT inserted through vocal cords under direct vision, positive ETCO2 and breath sounds checked- equal and bilateral Secured at: 22 cm Tube secured with: Tape Dental Injury: Teeth and Oropharynx as per pre-operative assessment

## 2021-01-27 NOTE — Discharge Instructions (Addendum)
Activity As tolerated: NO showers, do not get your face wet. Keep dressings in place. If you are able to shower without getting face/hair wet, you can shower. NO driving No heavy activities  Diet: Regular. Drink plenty of fluids (Water. Avoid sugar/sodas/diet sodas)  Wound Care: Keep dressing clean & dry. Leave dressing in place. Change dressing behind ear as needed for drainage. You have an incision behind each ear. You also have an incision on your left neck. You can apply antibiotic ointment to your neck and ear incisions daily. These may ooze blood for the first few days. Do not apply any dressing/gauze over nose. Do not change dressing over your nose.   When lying down, keep head elevated on 2-3 pillows or back-rest. Sleep in a reclined position.  Special Instructions: Call Doctor if any unusual problems occur such as pain, excessive bleeding, unrelieved Nausea/vomiting, Fever &/or chills Call with questions  Follow-up appointment: Scheduled for next week.

## 2021-01-27 NOTE — Transfer of Care (Signed)
Immediate Anesthesia Transfer of Care Note  Patient: Melanie Harris  Procedure(s) Performed: NASAL RECONSTRUCTION (Left: Nose) SKIN GRAFT FULL THICKNESS (Left: Nose) NASAL FLAP ROTATION (Left: Nose)  Patient Location: PACU  Anesthesia Type:General  Level of Consciousness: awake, alert  and oriented  Airway & Oxygen Therapy: Patient Spontanous Breathing  Post-op Assessment: Report given to RN and Post -op Vital signs reviewed and stable  Post vital signs: Reviewed and stable  Last Vitals:  Vitals Value Taken Time  BP 99/62 01/27/21 1816  Temp    Pulse 86 01/27/21 1819  Resp 60 01/27/21 1819  SpO2 98 % 01/27/21 1819  Vitals shown include unvalidated device data.  Last Pain:  Vitals:   01/27/21 1128  TempSrc:   PainSc: 2       Patients Stated Pain Goal: 3 (81/85/90 9311)  Complications: No notable events documented.

## 2021-01-30 ENCOUNTER — Encounter (HOSPITAL_COMMUNITY): Payer: Self-pay | Admitting: Plastic Surgery

## 2021-01-31 ENCOUNTER — Telehealth: Payer: Self-pay | Admitting: Plastic Surgery

## 2021-01-31 NOTE — Telephone Encounter (Signed)
Patient's sister called and stated that "blood has been gushing from incision, still has same gauze that was placed." Patient in a lot of pain, states that "pain medicine that was prescribed did not help at all." Patient desires to see Dr. Claudia Desanctis in person.  Please follow up with patient.

## 2021-01-31 NOTE — Telephone Encounter (Signed)
Patient's sister called back and mentioned that the patient has not been able to eat in days and she is worried that patient is getting dehydrated.

## 2021-01-31 NOTE — Anesthesia Postprocedure Evaluation (Signed)
Anesthesia Post Note  Patient: Melanie Harris  Procedure(s) Performed: NASAL RECONSTRUCTION (Left: Nose) SKIN GRAFT FULL THICKNESS (Left: Nose) NASAL FLAP ROTATION (Left: Nose)     Patient location during evaluation: PACU Anesthesia Type: General Level of consciousness: awake and alert Pain management: pain level controlled Vital Signs Assessment: post-procedure vital signs reviewed and stable Respiratory status: spontaneous breathing, nonlabored ventilation and respiratory function stable Cardiovascular status: stable and blood pressure returned to baseline Anesthetic complications: no   No notable events documented.  Last Vitals:  Vitals:   01/27/21 1845 01/27/21 1900  BP: 102/61 111/66  Pulse: 81 79  Resp: 14 16  Temp:  37.2 C  SpO2: 96% 98%    Last Pain:  Vitals:   01/27/21 1900  TempSrc:   PainSc: 0-No pain                 Audry Pili

## 2021-01-31 NOTE — Telephone Encounter (Signed)
Called and spoke with the patient's sister regarding the message below.  Informed the sister that I spoke with [facility] regarding her message, and he stated that he can see the patient on tomorrow.  The sister verbalized understanding and agreed. The patient was scheduled for (Wednesday-02/01/21 @ 11:40 am with Matthew,PA-C).    Also informed the sister if the patient gets worse over night she will need to take her to the ER.  The sister verbalized understanding and agreed.//AB/CMA

## 2021-02-01 ENCOUNTER — Ambulatory Visit (INDEPENDENT_AMBULATORY_CARE_PROVIDER_SITE_OTHER): Payer: No Typology Code available for payment source | Admitting: Surgical

## 2021-02-01 ENCOUNTER — Other Ambulatory Visit: Payer: Self-pay

## 2021-02-01 DIAGNOSIS — D049 Carcinoma in situ of skin, unspecified: Secondary | ICD-10-CM

## 2021-02-01 MED ORDER — HYDROCODONE-ACETAMINOPHEN 7.5-325 MG PO TABS: 1.0000 | ORAL_TABLET | Freq: Four times a day (QID) | ORAL | 0 refills | Status: AC | PRN

## 2021-02-01 NOTE — Progress Notes (Signed)
60 year old female here for follow-up after nasal reconstruction with Dr. Claudia Desanctis on 01/27/2021.  She is 5 days postop.  Intraoperatively she underwent nasal reconstruction with bilateral nasolabial flaps, full-thickness skin graft from the left neck, bilateral cartilage grafts, and a forehead flap.  She did have a nasal trumpet placed.  She reports that she is having a lot of difficulty with sleep, she is also having difficulty with eating due to the dressing.  She reports that she has been drinking plenty of water, she reports that she has not smoked in the past week.  She is not having any infectious symptoms.  She reports some pain at night that causes some difficulty sleeping and has been using the hydrocodone 5 mg but feels as if this is not helping enough.  On exam bilateral nasolabial flaps appear to be viable, unable to completely evaluate the distal lens due to the nasal trumpet.  The forehead flap appears viable, it is congested and a little bit dusky but has good capillary refill.  Left neck full-thickness skin graft donor site incision is healing well.  Mild bruising in this area.  Posterior ear incisions are healing well.  Forehead incision is intact and healing well.  Forehead wound with new granulation tissue noted.  There is no erythema or cellulitic changes.  No foul odor is noted.  60 year old female status post nasal reconstruction with Dr. Claudia Desanctis.  She is overall doing okay, recommend supplementing her diet with protein shakes 3 times per day.  Recommend drinking plenty of fluids.  I will send her a prescription for Norco 7.5 mg to help with increased pain.  I do not see any signs of infection on exam.  We will plan to remove nasal trumpet next week.  We removed her dressings and applied new dressings today.  She feels much better after this.

## 2021-02-02 ENCOUNTER — Encounter: Payer: No Typology Code available for payment source | Admitting: Plastic Surgery

## 2021-02-02 NOTE — Addendum Note (Signed)
Addended by: Lindon Romp on: 02/02/2021 04:03 PM   Modules accepted: Orders

## 2021-02-07 ENCOUNTER — Other Ambulatory Visit: Payer: Self-pay | Admitting: Physician Assistant

## 2021-02-07 ENCOUNTER — Other Ambulatory Visit: Payer: Self-pay | Admitting: Family Medicine

## 2021-02-07 ENCOUNTER — Other Ambulatory Visit: Payer: Self-pay

## 2021-02-07 DIAGNOSIS — E7800 Pure hypercholesterolemia, unspecified: Secondary | ICD-10-CM

## 2021-02-07 DIAGNOSIS — I10 Essential (primary) hypertension: Secondary | ICD-10-CM

## 2021-02-08 ENCOUNTER — Ambulatory Visit (INDEPENDENT_AMBULATORY_CARE_PROVIDER_SITE_OTHER): Payer: No Typology Code available for payment source | Admitting: Plastic Surgery

## 2021-02-08 ENCOUNTER — Other Ambulatory Visit: Payer: Self-pay

## 2021-02-08 DIAGNOSIS — D049 Carcinoma in situ of skin, unspecified: Secondary | ICD-10-CM

## 2021-02-08 NOTE — Progress Notes (Signed)
Patient presents 2 weeks postop from complex nasal reconstruction.  It has been a difficult time for her due to the complexity of the case.  She feels like things are heading in the right direction.  I did remove her nasal trumpet today which reveals widely patent left nare.  There looks to be a full take of the skin graft that was done on the nostril sill.  The color of the forehead flap looks to have normalized and does not look congested anymore.  As best I could tell the right-sided nasolabial flap for septal coverage looks healthy.  The other nasolabial flap is well buried.  For the moment we will plan to start the division and inset process in 2 weeks.  I will plan to see her next week to check her progress.  All of her questions were answered.

## 2021-02-09 ENCOUNTER — Other Ambulatory Visit: Payer: Self-pay | Admitting: Physician Assistant

## 2021-02-09 ENCOUNTER — Other Ambulatory Visit: Payer: Self-pay

## 2021-02-09 DIAGNOSIS — E7800 Pure hypercholesterolemia, unspecified: Secondary | ICD-10-CM

## 2021-02-09 DIAGNOSIS — I10 Essential (primary) hypertension: Secondary | ICD-10-CM

## 2021-02-09 MED ORDER — AMLODIPINE BESYLATE 10 MG PO TABS
ORAL_TABLET | Freq: Every day | ORAL | 6 refills | Status: DC
  Filled 2021-02-09 (×2): qty 30, fill #0
  Filled 2021-02-10 (×4): qty 30, 30d supply, fill #0
  Filled 2021-03-07: qty 30, 30d supply, fill #1
  Filled 2021-04-11: qty 90, 90d supply, fill #2
  Filled 2021-07-05: qty 30, 30d supply, fill #3
  Filled 2021-08-05: qty 30, 30d supply, fill #4

## 2021-02-09 MED ORDER — ATORVASTATIN CALCIUM 20 MG PO TABS
ORAL_TABLET | Freq: Every day | ORAL | 6 refills | Status: DC
  Filled 2021-02-09 (×2): qty 30, fill #0
  Filled 2021-02-10 (×4): qty 30, 30d supply, fill #0
  Filled 2021-03-07: qty 30, 30d supply, fill #1
  Filled 2021-04-11: qty 90, 90d supply, fill #2
  Filled 2021-07-05: qty 60, 60d supply, fill #3

## 2021-02-09 MED ORDER — METOPROLOL SUCCINATE ER 100 MG PO TB24
ORAL_TABLET | Freq: Every day | ORAL | 6 refills | Status: DC
  Filled 2021-02-09 (×2): qty 30, fill #0
  Filled 2021-02-10 (×4): qty 30, 30d supply, fill #0
  Filled 2021-03-07: qty 30, 30d supply, fill #1
  Filled 2021-04-11: qty 30, 30d supply, fill #2
  Filled 2021-05-09: qty 30, 30d supply, fill #3
  Filled 2021-06-07: qty 30, 30d supply, fill #4
  Filled 2021-07-05: qty 30, 30d supply, fill #5
  Filled 2021-08-05: qty 30, 30d supply, fill #6

## 2021-02-10 ENCOUNTER — Other Ambulatory Visit (HOSPITAL_COMMUNITY): Payer: Self-pay

## 2021-02-10 ENCOUNTER — Other Ambulatory Visit: Payer: Self-pay

## 2021-02-11 ENCOUNTER — Other Ambulatory Visit: Payer: Self-pay

## 2021-02-15 ENCOUNTER — Other Ambulatory Visit: Payer: Self-pay

## 2021-02-15 ENCOUNTER — Ambulatory Visit (INDEPENDENT_AMBULATORY_CARE_PROVIDER_SITE_OTHER): Payer: No Typology Code available for payment source | Admitting: Plastic Surgery

## 2021-02-15 DIAGNOSIS — D049 Carcinoma in situ of skin, unspecified: Secondary | ICD-10-CM

## 2021-02-15 NOTE — H&P (View-Only) (Signed)
Patient presents about 3 weeks out from for stage of her nasal reconstruction.  She is hanging in there.  Nothing new and no new complaints.  On exam the forehead flap is well visualized and looks viable.  The remaining tissue from the left nasolabial flap looks to be healing fine.  The intranasal septal lining reconstruction from the right nasolabial flap is a bit difficult to evaluate because of dried blood that is challenging to get off in the office.  It is possible that she may have some distal necrosis of the right nasolabial flap but it is hard to fully evaluate it.  We have her scheduled for surgery next week.  The plan will be to divide the left nasolabial flap and definitively inset the lateral aspect of the forehead flap.  I will evaluate the right nasolabial flap and if it looks reasonable intranasally then I will divide that.  Alternatively I may be able to thin and advance the right nasolabial flap if needed for further lining reconstruction.  She understands that she will need a third stage at the least.  We reviewed the risks and benefits of surgery.  All her questions were answered.

## 2021-02-15 NOTE — Progress Notes (Signed)
Patient presents about 3 weeks out from for stage of her nasal reconstruction.  She is hanging in there.  Nothing new and no new complaints.  On exam the forehead flap is well visualized and looks viable.  The remaining tissue from the left nasolabial flap looks to be healing fine.  The intranasal septal lining reconstruction from the right nasolabial flap is a bit difficult to evaluate because of dried blood that is challenging to get off in the office.  It is possible that she may have some distal necrosis of the right nasolabial flap but it is hard to fully evaluate it.  We have her scheduled for surgery next week.  The plan will be to divide the left nasolabial flap and definitively inset the lateral aspect of the forehead flap.  I will evaluate the right nasolabial flap and if it looks reasonable intranasally then I will divide that.  Alternatively I may be able to thin and advance the right nasolabial flap if needed for further lining reconstruction.  She understands that she will need a third stage at the least.  We reviewed the risks and benefits of surgery.  All her questions were answered.

## 2021-02-17 ENCOUNTER — Other Ambulatory Visit (HOSPITAL_COMMUNITY): Payer: Self-pay

## 2021-02-20 ENCOUNTER — Other Ambulatory Visit: Payer: Self-pay

## 2021-02-20 ENCOUNTER — Encounter (HOSPITAL_COMMUNITY): Payer: Self-pay | Admitting: Plastic Surgery

## 2021-02-20 NOTE — Progress Notes (Signed)
PCP - Dr. Margarita Rana Cardiologist - Dr. Angelena Form EKG - 01/16/21 Chest x-ray -  ECHO - 05/31/17 Cardiac Cath -  CPAP -    ERAS Protcol - n/a - clears and sips with meds 0745   COVID TEST- n/a   Anesthesia review: n/a   -------------   SDW INSTRUCTIONS:   Your procedure is scheduled on Jan 17. Please report to [facility] Main Entrance "A" at 8:15A.M., and check in at the Admitting office. Call this number if you have problems the morning of surgery: [phone]     Remember: Do not eat  after midnight the night before your surgery   You may drink clear liquids until 10:45 AM the morning of your surgery.   Clear liquids allowed are: Water, Non-Citrus Juices (without pulp), Carbonated Beverages, Clear Tea, Black Coffee Only, and Gatorade              Medications to take morning of surgery with a sip of water include: Amlodipine (norvasc) Atorvastatin (lipitor) Bupropion (wellbutrin) Metoprolol succinate (toprol-xl)   As of today, STOP taking any Aspirin (unless otherwise instructed by your surgeon), Aleve, Naproxen, Ibuprofen, Motrin, Advil, Goody's, BC's, all herbal medications, fish oil, and all vitamins.                The Morning of Surgery Do not wear jewelry, make-up or nail polish. Do not wear lotions, powders, or perfumes or deodorant   Do not bring valuables to the hospital. [facility] is not responsible for any belongings or valuables.   If you are a smoker, DO NOT Smoke 24 hours prior to surgery   If you wear a CPAP at night please bring your mask the morning of surgery    Remember that you must have someone to transport you home after your surgery, and remain with you for 24 hours if you are discharged the same day.   Please bring cases for contacts, glasses, hearing aids, dentures or bridgework because it cannot be worn into surgery.    Patients discharged the day of surgery will not be allowed to drive home.    Please shower the NIGHT BEFORE/MORNING OF  SURGERY (use antibacterial soap like DIAL soap if possible). Wear comfortable clothes the morning of surgery. Oral Hygiene is also important to reduce your risk of infection.  Remember - BRUSH YOUR TEETH THE MORNING OF SURGERY WITH YOUR REGULAR TOOTHPASTE   Patient denies shortness of breath, fever, cough and chest pain.

## 2021-02-21 ENCOUNTER — Encounter (HOSPITAL_COMMUNITY)
Admission: RE | Disposition: A | Discharge status: Home / Self Care | Payer: Self-pay | Source: Home / Self Care | Attending: Plastic Surgery

## 2021-02-21 ENCOUNTER — Ambulatory Visit (HOSPITAL_COMMUNITY)
Admission: RE | Admit: 2021-02-21 | Discharge: 2021-02-21 | Disposition: A | Discharge status: Home / Self Care | Payer: No Typology Code available for payment source | Attending: Plastic Surgery | Admitting: Plastic Surgery

## 2021-02-21 ENCOUNTER — Ambulatory Visit (HOSPITAL_COMMUNITY): Payer: No Typology Code available for payment source | Admitting: Certified Registered Nurse Anesthetist

## 2021-02-21 ENCOUNTER — Other Ambulatory Visit: Payer: Self-pay

## 2021-02-21 ENCOUNTER — Encounter (HOSPITAL_COMMUNITY): Payer: Self-pay | Admitting: Plastic Surgery

## 2021-02-21 DIAGNOSIS — F17200 Nicotine dependence, unspecified, uncomplicated: Secondary | ICD-10-CM | POA: Insufficient documentation

## 2021-02-21 DIAGNOSIS — Z79899 Other long term (current) drug therapy: Secondary | ICD-10-CM | POA: Insufficient documentation

## 2021-02-21 DIAGNOSIS — Z7689 Persons encountering health services in other specified circumstances: Secondary | ICD-10-CM | POA: Insufficient documentation

## 2021-02-21 DIAGNOSIS — F419 Anxiety disorder, unspecified: Secondary | ICD-10-CM | POA: Insufficient documentation

## 2021-02-21 DIAGNOSIS — Z85828 Personal history of other malignant neoplasm of skin: Secondary | ICD-10-CM | POA: Insufficient documentation

## 2021-02-21 DIAGNOSIS — J449 Chronic obstructive pulmonary disease, unspecified: Secondary | ICD-10-CM | POA: Insufficient documentation

## 2021-02-21 DIAGNOSIS — I10 Essential (primary) hypertension: Secondary | ICD-10-CM | POA: Insufficient documentation

## 2021-02-21 DIAGNOSIS — M950 Acquired deformity of nose: Secondary | ICD-10-CM

## 2021-02-21 HISTORY — PX: NASAL RECONSTRUCTION: SHX2069

## 2021-02-21 LAB — CBC
HCT: 41.4 % (ref 36.0–46.0)
Hemoglobin: 13.9 g/dL (ref 12.0–15.0)
MCH: 32.9 pg (ref 26.0–34.0)
MCHC: 33.6 g/dL (ref 30.0–36.0)
MCV: 98.1 fL (ref 80.0–100.0)
Platelets: 255 10*3/uL (ref 150–400)
RBC: 4.22 MIL/uL (ref 3.87–5.11)
RDW: 13 % (ref 11.5–15.5)
WBC: 10.5 10*3/uL (ref 4.0–10.5)
nRBC: 0 % (ref 0.0–0.2)

## 2021-02-21 LAB — BASIC METABOLIC PANEL
Anion gap: 12 (ref 5–15)
BUN: 13 mg/dL (ref 6–20)
CO2: 21 mmol/L — ABNORMAL LOW (ref 22–32)
Calcium: 9.6 mg/dL (ref 8.9–10.3)
Chloride: 104 mmol/L (ref 98–111)
Creatinine, Ser: 0.67 mg/dL (ref 0.44–1.00)
GFR, Estimated: >60 mL/min (ref >60)
Glucose, Bld: 93 mg/dL (ref 70–99)
Potassium: 3.9 mmol/L (ref 3.5–5.1)
Sodium: 137 mmol/L (ref 135–145)

## 2021-02-21 SURGERY — RECONSTRUCTION, NOSE
Anesthesia: General | Site: Nose | Laterality: Bilateral

## 2021-02-21 MED ORDER — CHLORHEXIDINE GLUCONATE CLOTH 2 % EX PADS: 6.0000 | MEDICATED_PAD | Freq: Once | CUTANEOUS | Status: DC

## 2021-02-21 MED ORDER — PROPOFOL 10 MG/ML IV BOLUS
INTRAVENOUS | Status: DC | PRN
  Administered 2021-02-21: 100 mg via INTRAVENOUS

## 2021-02-21 MED ORDER — BACITRACIN ZINC 500 UNIT/GM EX OINT
TOPICAL_OINTMENT | CUTANEOUS | Status: AC
  Filled 2021-02-21: qty 28.35

## 2021-02-21 MED ORDER — 0.9 % SODIUM CHLORIDE (POUR BTL) OPTIME
TOPICAL | Status: DC | PRN
  Administered 2021-02-21: 1000 mL

## 2021-02-21 MED ORDER — MIDAZOLAM HCL 2 MG/2ML IJ SOLN
INTRAMUSCULAR | Status: DC | PRN
Start: 2021-02-21 — End: 2021-02-21
  Administered 2021-02-21: 2 mg via INTRAVENOUS

## 2021-02-21 MED ORDER — LACTATED RINGERS IV SOLN: INTRAVENOUS | Status: DC

## 2021-02-21 MED ORDER — PROMETHAZINE HCL 25 MG/ML IJ SOLN: 6.2500 mg | INTRAMUSCULAR | Status: DC | PRN

## 2021-02-21 MED ORDER — FENTANYL CITRATE (PF) 250 MCG/5ML IJ SOLN
INTRAMUSCULAR | Status: AC
  Filled 2021-02-21: qty 5

## 2021-02-21 MED ORDER — ACETAMINOPHEN 500 MG PO TABS
1000.0000 mg | ORAL_TABLET | Freq: Once | ORAL | Status: AC
  Administered 2021-02-21: 1000 mg via ORAL
  Filled 2021-02-21: qty 2

## 2021-02-21 MED ORDER — OXYMETAZOLINE HCL 0.05 % NA SOLN
NASAL | Status: AC
  Filled 2021-02-21: qty 30

## 2021-02-21 MED ORDER — AMISULPRIDE (ANTIEMETIC) 5 MG/2ML IV SOLN: 10.0000 mg | Freq: Once | INTRAVENOUS | Status: DC | PRN

## 2021-02-21 MED ORDER — LIDOCAINE 2% (20 MG/ML) 5 ML SYRINGE
INTRAMUSCULAR | Status: DC | PRN
  Administered 2021-02-21: 40 mg via INTRAVENOUS

## 2021-02-21 MED ORDER — HYDROCODONE-ACETAMINOPHEN 5-325 MG PO TABS: 1.0000 | ORAL_TABLET | Freq: Four times a day (QID) | ORAL | 0 refills | Status: DC | PRN

## 2021-02-21 MED ORDER — DEXAMETHASONE SODIUM PHOSPHATE 10 MG/ML IJ SOLN
INTRAMUSCULAR | Status: DC | PRN
  Administered 2021-02-21: 10 mg via INTRAVENOUS

## 2021-02-21 MED ORDER — MIDAZOLAM HCL 2 MG/2ML IJ SOLN
INTRAMUSCULAR | Status: AC
  Filled 2021-02-21: qty 2

## 2021-02-21 MED ORDER — SUGAMMADEX SODIUM 200 MG/2ML IV SOLN
INTRAVENOUS | Status: DC | PRN
  Administered 2021-02-21: 86.2 mg via INTRAVENOUS

## 2021-02-21 MED ORDER — PHENYLEPHRINE 40 MCG/ML (10ML) SYRINGE FOR IV PUSH (FOR BLOOD PRESSURE SUPPORT)
PREFILLED_SYRINGE | INTRAVENOUS | Status: DC | PRN
  Administered 2021-02-21: 40 ug via INTRAVENOUS
  Administered 2021-02-21 (×2): 120 ug via INTRAVENOUS

## 2021-02-21 MED ORDER — FENTANYL CITRATE (PF) 250 MCG/5ML IJ SOLN
INTRAMUSCULAR | Status: DC | PRN
Start: 2021-02-21 — End: 2021-02-21
  Administered 2021-02-21: 50 ug via INTRAVENOUS
  Administered 2021-02-21: 150 ug via INTRAVENOUS

## 2021-02-21 MED ORDER — CHLORHEXIDINE GLUCONATE 0.12 % MT SOLN
15.0000 mL | Freq: Once | OROMUCOSAL | Status: AC
  Administered 2021-02-21: 15 mL via OROMUCOSAL
  Filled 2021-02-21: qty 15

## 2021-02-21 MED ORDER — KETOROLAC TROMETHAMINE 30 MG/ML IJ SOLN: 15.0000 mg | Freq: Once | INTRAMUSCULAR | Status: DC | PRN

## 2021-02-21 MED ORDER — ONDANSETRON HCL 4 MG/2ML IJ SOLN
INTRAMUSCULAR | Status: DC | PRN
Start: 2021-02-21 — End: 2021-02-21
  Administered 2021-02-21: 4 mg via INTRAVENOUS

## 2021-02-21 MED ORDER — LIDOCAINE-EPINEPHRINE 1 %-1:100000 IJ SOLN
INTRAMUSCULAR | Status: DC | PRN
  Administered 2021-02-21: 6 mL

## 2021-02-21 MED ORDER — ORAL CARE MOUTH RINSE: 15.0000 mL | Freq: Once | OROMUCOSAL | Status: AC

## 2021-02-21 MED ORDER — CEFAZOLIN SODIUM-DEXTROSE 2-4 GM/100ML-% IV SOLN
2.0000 g | INTRAVENOUS | Status: AC
  Administered 2021-02-21: 2 g via INTRAVENOUS
  Filled 2021-02-21: qty 100

## 2021-02-21 MED ORDER — BACITRACIN ZINC 500 UNIT/GM EX OINT
TOPICAL_OINTMENT | CUTANEOUS | Status: DC | PRN
  Administered 2021-02-21: 1 via TOPICAL

## 2021-02-21 MED ORDER — ROCURONIUM BROMIDE 10 MG/ML (PF) SYRINGE
PREFILLED_SYRINGE | INTRAVENOUS | Status: DC | PRN
  Administered 2021-02-21 (×2): 30 mg via INTRAVENOUS

## 2021-02-21 MED ORDER — FENTANYL CITRATE (PF) 100 MCG/2ML IJ SOLN
25.0000 ug | INTRAMUSCULAR | Status: DC | PRN
  Administered 2021-02-21 (×4): 25 ug via INTRAVENOUS

## 2021-02-21 MED ORDER — BACITRACIN-NEOMYCIN-POLYMYXIN OINTMENT TUBE
TOPICAL_OINTMENT | CUTANEOUS | Status: AC
  Filled 2021-02-21: qty 14.17

## 2021-02-21 MED ORDER — LIDOCAINE-EPINEPHRINE 1 %-1:100000 IJ SOLN
INTRAMUSCULAR | Status: AC
  Filled 2021-02-21: qty 1

## 2021-02-21 MED ORDER — FENTANYL CITRATE (PF) 100 MCG/2ML IJ SOLN
INTRAMUSCULAR | Status: AC
  Filled 2021-02-21: qty 2

## 2021-02-21 MED ORDER — PHENYLEPHRINE HCL-NACL 20-0.9 MG/250ML-% IV SOLN
INTRAVENOUS | Status: DC | PRN
  Administered 2021-02-21: 40 ug/min via INTRAVENOUS

## 2021-02-21 SURGICAL SUPPLY — 59 items
ADH SKN CLS APL DERMABOND .7 (GAUZE/BANDAGES/DRESSINGS)
BAG COUNTER SPONGE SURGICOUNT (BAG) IMPLANT
BAG SPNG CNTER NS LX DISP (BAG)
BALL CTTN LRG ABS STRL LF (GAUZE/BANDAGES/DRESSINGS)
BLADE HEX COATED 2.75 (ELECTRODE) IMPLANT
BLADE SURG 15 STRL LF DISP TIS (BLADE) ×2 IMPLANT
BLADE SURG 15 STRL SS (BLADE) ×2
BNDG GAUZE ELAST 4 BULKY (GAUZE/BANDAGES/DRESSINGS) IMPLANT
CANISTER SUCT 1200ML W/VALVE (MISCELLANEOUS) ×3 IMPLANT
COTTONBALL LRG STERILE PKG (GAUZE/BANDAGES/DRESSINGS) IMPLANT
COVER SURGICAL LIGHT HANDLE (MISCELLANEOUS) ×3 IMPLANT
DECANTER SPIKE VIAL GLASS SM (MISCELLANEOUS) ×3 IMPLANT
DERMABOND ADVANCED (GAUZE/BANDAGES/DRESSINGS)
DERMABOND ADVANCED .7 DNX12 (GAUZE/BANDAGES/DRESSINGS) IMPLANT
ELECT COATED BLADE 2.86 ST (ELECTRODE) IMPLANT
ELECT NDL BLADE 2-5/6 (NEEDLE) ×2 IMPLANT
ELECT NEEDLE BLADE 2-5/6 (NEEDLE) ×2 IMPLANT
ELECT REM PT RETURN 9FT ADLT (ELECTROSURGICAL) ×2
ELECTRODE REM PT RTRN 9FT ADLT (ELECTROSURGICAL) ×2 IMPLANT
GAUZE SPONGE 4X4 12PLY STRL LF (GAUZE/BANDAGES/DRESSINGS) ×3 IMPLANT
GAUZE VASELINE FOILPK 1/2 X 72 (GAUZE/BANDAGES/DRESSINGS) IMPLANT
GAUZE XEROFORM 1X8 LF (GAUZE/BANDAGES/DRESSINGS) IMPLANT
GAUZE XEROFORM 5X9 LF (GAUZE/BANDAGES/DRESSINGS) IMPLANT
GLOVE SRG 8 PF TXTR STRL LF DI (GLOVE) ×2 IMPLANT
GLOVE SURG ENC MOIS LTX SZ6.5 (GLOVE) IMPLANT
GLOVE SURG ENC TEXT LTX SZ7.5 (GLOVE) ×3 IMPLANT
GLOVE SURG UNDER POLY LF SZ8 (GLOVE) ×2
GOWN STRL REUS W/ TWL LRG LVL3 (GOWN DISPOSABLE) ×4 IMPLANT
GOWN STRL REUS W/TWL LRG LVL3 (GOWN DISPOSABLE) ×4
HEMOSTAT SURGICEL 2X14 (HEMOSTASIS) IMPLANT
KIT BASIN OR (CUSTOM PROCEDURE TRAY) ×3 IMPLANT
MARKER SKIN DUAL TIP RULER LAB (MISCELLANEOUS) IMPLANT
NDL PRECISIONGLIDE 27X1.5 (NEEDLE) ×2 IMPLANT
NEEDLE PRECISIONGLIDE 27X1.5 (NEEDLE) ×2 IMPLANT
PACK EENT II TURBAN DRAPE (CUSTOM PROCEDURE TRAY) ×3 IMPLANT
PACK GENERAL/GYN (CUSTOM PROCEDURE TRAY) ×2 IMPLANT
PATTIES SURGICAL .5 X3 (DISPOSABLE) IMPLANT
PENCIL SMOKE EVACUATOR (MISCELLANEOUS) ×3 IMPLANT
SLEEVE SCD COMPRESS KNEE MED (STOCKING) ×3 IMPLANT
SPLINT NASAL DOYLE BI-VL (GAUZE/BANDAGES/DRESSINGS) IMPLANT
SPONGE GAUZE 2X2 8PLY STRL LF (GAUZE/BANDAGES/DRESSINGS) IMPLANT
STAPLER SKIN 35 WIDE (STAPLE) IMPLANT
STRIP CLOSURE SKIN 1/2X4 (GAUZE/BANDAGES/DRESSINGS) ×1 IMPLANT
SUT MNCRL 6-0 UNDY P1 1X18 (SUTURE) IMPLANT
SUT MNCRL AB 3-0 PS2 18 (SUTURE) IMPLANT
SUT MNCRL AB 4-0 PS2 18 (SUTURE) IMPLANT
SUT MON AB 4-0 PC3 18 (SUTURE) IMPLANT
SUT MON AB 5-0 P3 18 (SUTURE) IMPLANT
SUT MON AB 5-0 PS2 18 (SUTURE) IMPLANT
SUT MONOCRYL 6-0 P1 1X18 (SUTURE)
SUT PDS AB 3-0 SH 27 (SUTURE) IMPLANT
SUT PDS AB 4-0 RB1 27 (SUTURE) IMPLANT
SUT PROLENE 5 0 P 3 (SUTURE) IMPLANT
SUT PROLENE 5 0 PS 2 (SUTURE) IMPLANT
SUT PROLENE 6 0 P 1 18 (SUTURE) IMPLANT
SUT VIC AB 5-0 PS2 18 (SUTURE) ×2 IMPLANT
SUT VICRYL 4-0 PS2 18IN ABS (SUTURE) IMPLANT
SYR BULB EAR ULCER 3OZ GRN STR (SYRINGE) ×2 IMPLANT
TOWEL GREEN STERILE FF (TOWEL DISPOSABLE) ×3 IMPLANT

## 2021-02-21 NOTE — Transfer of Care (Signed)
Immediate Anesthesia Transfer of Care Note  Patient: Melanie Harris  Procedure(s) Performed: DIVISION AND INSET OF NASAL FLAPS, DEBRIDEMENT RIGHT NASOLABIAL FLAP, REVISION OF INSET RIGHT NASOLABIAL FLAP (Bilateral: Nose)  Patient Location: PACU  Anesthesia Type:General  Level of Consciousness: awake, alert  and oriented  Airway & Oxygen Therapy: Patient Spontanous Breathing  Post-op Assessment: Report given to RN and Post -op Vital signs reviewed and stable  Post vital signs: Reviewed and stable  Last Vitals:  Vitals Value Taken Time  BP    Temp    Pulse    Resp    SpO2      Last Pain:  Vitals:   02/21/21 0902  PainSc: 0-No pain         Complications: No notable events documented.

## 2021-02-21 NOTE — Interval H&P Note (Signed)
Patient seen and examined. Risks and benefits discussed. Proceed with surgery.

## 2021-02-21 NOTE — Anesthesia Preprocedure Evaluation (Addendum)
Anesthesia Evaluation  Patient identified by MRN, date of birth, ID band Patient awake    Reviewed: Allergy & Precautions, NPO status , Patient's Chart, lab work & pertinent test results  Airway Mallampati: III  TM Distance: >3 FB Neck ROM: Full    Dental  (+) Poor Dentition   Pulmonary COPD, Current SmokerPatient did not abstain from smoking.,    Pulmonary exam normal breath sounds clear to auscultation       Cardiovascular hypertension, Pt. on medications and Pt. 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  Reproductive/Obstetrics                            Anesthesia Physical Anesthesia Plan  ASA: 3  Anesthesia Plan: General   Post-op Pain Management:    Induction: Intravenous  PONV Risk Score and Plan: 2 and Ondansetron, Dexamethasone, Midazolam and Treatment may vary due to age or medical condition  Airway Management Planned: Oral ETT and Video Laryngoscope Planned  Additional Equipment:   Intra-op Plan:   Post-operative Plan: Extubation in OR  Informed Consent: I have reviewed the patients History and Physical, chart, labs and discussed the procedure including the risks, benefits and alternatives for the proposed anesthesia with the patient or authorized representative who has indicated his/her understanding and acceptance.     Dental advisory given  Plan Discussed with: CRNA  Anesthesia Plan Comments:        Anesthesia Quick Evaluation

## 2021-02-21 NOTE — Discharge Instructions (Addendum)
Activity: As tolerated, but avoid strenuous activity until follow up visit.  Diet: Regular  Wound Care: Apply Vaseline to incisions as well as to the insides of your nares to help keep the flaps moist and healthy. You may shower regularly.  Keep the areas clean.  Special Instructions:  Call our office if any unusual problems occur such as pain, excessive bleeding, unrelieved nausea/vomiting, fever &/or chills.  Follow-up appointment: Scheduled for next week.

## 2021-02-21 NOTE — Anesthesia Postprocedure Evaluation (Signed)
Anesthesia Post Note  Patient: Tzipporah Nagorski  Procedure(s) Performed: DIVISION AND INSET OF NASAL FLAPS, DEBRIDEMENT RIGHT NASOLABIAL FLAP, REVISION OF INSET RIGHT NASOLABIAL FLAP (Bilateral: Nose)     Patient location during evaluation: PACU Anesthesia Type: General Level of consciousness: awake Pain management: pain level controlled Vital Signs Assessment: post-procedure vital signs reviewed and stable Respiratory status: spontaneous breathing, nonlabored ventilation, respiratory function stable and patient connected to nasal cannula oxygen Cardiovascular status: blood pressure returned to baseline and stable Postop Assessment: no apparent nausea or vomiting Anesthetic complications: no   No notable events documented.  Last Vitals:  Vitals:   02/21/21 1318 02/21/21 1332  BP: 115/63 115/65  Pulse: 73 74  Resp: 15 12  Temp:  36.7 C  SpO2: 95% 98%    Last Pain:  Vitals:   02/21/21 1148  PainSc: Asleep                 Akilah Cureton P Ranelle Auker

## 2021-02-21 NOTE — Anesthesia Procedure Notes (Signed)
Procedure Name: Intubation Date/Time: 02/21/2021 9:44 AM Performed by: Minerva Ends, CRNA Pre-anesthesia Checklist: Patient identified, Emergency Drugs available, Suction available and Patient being monitored Patient Re-evaluated:Patient Re-evaluated prior to induction Oxygen Delivery Method: Circle system utilized Preoxygenation: Pre-oxygenation with 100% oxygen Induction Type: IV induction Ventilation: Mask ventilation without difficulty Laryngoscope Size: Mac, 3 and Glidescope Grade View: Grade I Tube type: Oral Tube size: 7.0 mm Number of attempts: 1 Airway Equipment and Method: Stylet and Oral airway Placement Confirmation: ETT inserted through vocal cords under direct vision, positive ETCO2 and breath sounds checked- equal and bilateral Secured at: 22 cm Tube secured with: Tape Dental Injury: Teeth and Oropharynx as per pre-operative assessment  Difficulty Due To: Difficult Airway- due to anterior larynx Comments: DL x 1 unsuccessful attempt with grade 3 view. Esophagus intubation. OG tube passed and suction performed. OG tube removed. Attempt 2 successful with glidescope grade 1 view

## 2021-02-21 NOTE — Op Note (Signed)
Operative Note   DATE OF OPERATION: 02/21/2021  SURGICAL DEPARTMENT: Plastic Surgery  PREOPERATIVE DIAGNOSES: Nasal reconstruction status post forehead and nasolabial flaps  POSTOPERATIVE DIAGNOSES:  same  PROCEDURE: 1.  Division and inset left nasolabial flap 2.  Debridement of right nasolabial flap totaling 1.5 x 1.5 cm 3.  Transfer of pedicled right nasolabial flap to the upper lip with a planned subsequent advancement to the nasal lining  SURGEON: Talmadge Coventry, MD  ASSISTANT: Krista Blue, PA The advanced practice practitioner (APP) assisted throughout the case.  The APP was essential in retraction and counter traction when needed to make the case progress smoothly.  This retraction and assistance made it possible to see the tissue planes for the procedure.  The assistance was needed for hemostasis, tissue re-approximation and closure of the incision site.   ANESTHESIA:  General.   COMPLICATIONS: None.   INDICATIONS FOR PROCEDURE:  The patient, Melanie Harris is a 61 y.o. female born on 1960/09/07, is here for treatment of nasal reconstruction MRN: [id number]  CONSENT:  Informed consent was obtained directly from the patient. Risks, benefits and alternatives were fully discussed. Specific risks including but not limited to bleeding, infection, hematoma, seroma, scarring, pain, contracture, asymmetry, wound healing problems, and need for further surgery were all discussed. The patient did have an ample opportunity to have questions answered to satisfaction.   DESCRIPTION OF PROCEDURE:  The patient was taken to the operating room. SCDs were placed and antibiotics were given.  General anesthesia was administered.  The patient's operative site was prepped and draped in a sterile fashion. A time out was performed and all information was confirmed to be correct.  Started by evaluating the reconstruction.  Overall the forehead flap and left nasolabial flap looked very healthy and  were integrating well with good color.  Right nasolabial flap showed some evidence of necrosis as it was inset into the left nare septal lining.  I elected to debride the eschar revealing the cartilage graft that was previously placed.  The nasolabial flap was then detached from its remaining small attachments to the columella.  A 2.5 cm incision was then made from the columella towards the right nasal sill.  Hemostasis was obtained.  The tip of the right nasolabial flap was then debrided and inset into that wound and tubularized.  Inset was done with interrupted 5-0 Monocryl sutures.  I then turned my attention to the left nasolabial flap.  It was divided at its base with a 15 blade.  It was bleeding well on each side.  The pedicle origin was then debrided and closed interrupted buried 5-0 Monocryl sutures and a running 5-0 Monocryl.  I then planned out the location for closing off the nasal lining and match this with the other side.  A strip of skin was then removed from this location and the remaining nasolabial flap was then tailored and inset with 5-0 Monocryl sutures.  The external closure for the alar groove was then done by suturing the now lateral aspect of the forehead flap to the corresponding site to create the outer aspect of the new ala.  This gave a nice symmetric result and she maintained good nasal support and a patent nostril.  Vaseline was then applied to all the incisions and the exposed cartilage graft for the septum.  The patient tolerated the procedure well.  There were no complications. The patient was allowed to wake from anesthesia, extubated and taken to the recovery room in satisfactory condition.

## 2021-02-22 ENCOUNTER — Encounter (HOSPITAL_COMMUNITY): Payer: Self-pay | Admitting: Plastic Surgery

## 2021-03-01 ENCOUNTER — Other Ambulatory Visit: Payer: Self-pay

## 2021-03-01 ENCOUNTER — Ambulatory Visit (INDEPENDENT_AMBULATORY_CARE_PROVIDER_SITE_OTHER): Payer: No Typology Code available for payment source | Admitting: Plastic Surgery

## 2021-03-01 DIAGNOSIS — D049 Carcinoma in situ of skin, unspecified: Secondary | ICD-10-CM

## 2021-03-01 MED ORDER — CLONAZEPAM 0.5 MG PO TABS
0.5000 mg | ORAL_TABLET | Freq: Every evening | ORAL | 1 refills | Status: DC | PRN
  Filled 2021-03-01: qty 20, 20d supply, fill #0
  Filled 2021-04-11: qty 20, 20d supply, fill #1

## 2021-03-01 NOTE — Progress Notes (Signed)
Patient presents in follow-up for nasal reconstruction.  She feels like things are going reasonably well.  She is having trouble sleeping at night and is bothered by facial tightness.  On exam the forehead flap continues to have good viability.  The left nasolabial flap was recently divided and inset and that seems to be healing fine.  The right nasolabial flap was inset into the upper lip due to necrosis on the septal lining portion.  That seems to be holding fine as well.  In 2 weeks we will plan to divide the forehead flap and hopefully advance the right nasolabial flap intranasally.  I explained that she may need additional cartilage graft taken which I would need to take from the rib.  She is understanding.  We will plan to set her up for surgery in 2 weeks.  All of her questions were answered.

## 2021-03-01 NOTE — H&P (View-Only) (Signed)
Patient presents in follow-up for nasal reconstruction.  She feels like things are going reasonably well.  She is having trouble sleeping at night and is bothered by facial tightness.  On exam the forehead flap continues to have good viability.  The left nasolabial flap was recently divided and inset and that seems to be healing fine.  The right nasolabial flap was inset into the upper lip due to necrosis on the septal lining portion.  That seems to be holding fine as well.  In 2 weeks we will plan to divide the forehead flap and hopefully advance the right nasolabial flap intranasally.  I explained that she may need additional cartilage graft taken which I would need to take from the rib.  She is understanding.  We will plan to set her up for surgery in 2 weeks.  All of her questions were answered.

## 2021-03-02 ENCOUNTER — Other Ambulatory Visit: Payer: Self-pay

## 2021-03-03 ENCOUNTER — Other Ambulatory Visit: Payer: Self-pay

## 2021-03-07 ENCOUNTER — Other Ambulatory Visit: Payer: Self-pay

## 2021-03-08 ENCOUNTER — Other Ambulatory Visit: Payer: Self-pay

## 2021-03-09 ENCOUNTER — Encounter (HOSPITAL_COMMUNITY): Payer: Self-pay | Admitting: Plastic Surgery

## 2021-03-09 ENCOUNTER — Other Ambulatory Visit: Payer: Self-pay

## 2021-03-09 NOTE — Progress Notes (Signed)
PCP - Dr. Margarita Rana Cardiologist - Dr. Angelena Form EKG - 01/16/21 Chest x-ray -  ECHO - 05/31/17 Cardiac Cath -   ERAS Protcol - clears 1230 COVID TEST- n/a  Anesthesia review: n/a  -------------  SDW INSTRUCTIONS:  Your procedure is scheduled on Monday 2/6. Please report to [facility] Main Entrance "A" at 1 PM., and check in at the Admitting office. Call this number if you have problems the morning of surgery: [phone]   Remember: Do not eat after midnight the night before your surgery  You may drink clear liquids until 12:30 the day of your surgery.   Clear liquids allowed are: Water, Non-Citrus Juices (without pulp), Carbonated Beverages, Clear Tea, Black Coffee Only, and Gatorade   Medications to take morning of surgery with a sip of water include: acetaminophen (TYLENOL) if needed amLODipine (NORVASC)  atorvastatin (LIPITOR)  clonazePAM (KLONOPIN) if needed metoprolol succinate (TOPROL-XL)   As of today, STOP taking any Aspirin (unless otherwise instructed by your surgeon), Aleve, Naproxen, Ibuprofen, Motrin, Advil, Goody's, BC's, all herbal medications, fish oil, and all vitamins.    The Morning of Surgery Do not wear jewelry, make-up or nail polish. Do not wear lotions, powders, or perfumes, or deodorant Do not bring valuables to the hospital. [facility] is not responsible for any belongings or valuables.  If you are a smoker, DO NOT Smoke 24 hours prior to surgery  If you wear a CPAP at night please bring your mask the morning of surgery   Remember that you must have someone to transport you home after your surgery, and remain with you for 24 hours if you are discharged the same day.  Please bring cases for contacts, glasses, hearing aids, dentures or bridgework because it cannot be worn into surgery.   Patients discharged the day of surgery will not be allowed to drive home.   Please shower the NIGHT BEFORE/MORNING OF SURGERY (use antibacterial soap like DIAL  soap if possible). Wear comfortable clothes the morning of surgery. Oral Hygiene is also important to reduce your risk of infection.  Remember - BRUSH YOUR TEETH THE MORNING OF SURGERY WITH YOUR REGULAR TOOTHPASTE  Patient denies shortness of breath, fever, cough and chest pain.

## 2021-03-10 ENCOUNTER — Ambulatory Visit: Payer: No Typology Code available for payment source

## 2021-03-13 ENCOUNTER — Ambulatory Visit (HOSPITAL_COMMUNITY): Payer: No Typology Code available for payment source | Admitting: Anesthesiology

## 2021-03-13 ENCOUNTER — Other Ambulatory Visit: Payer: Self-pay

## 2021-03-13 ENCOUNTER — Encounter (HOSPITAL_COMMUNITY)
Admission: RE | Disposition: A | Discharge status: Home / Self Care | Payer: Self-pay | Source: Home / Self Care | Attending: Plastic Surgery

## 2021-03-13 ENCOUNTER — Ambulatory Visit (HOSPITAL_COMMUNITY)
Admission: RE | Admit: 2021-03-13 | Discharge: 2021-03-13 | Disposition: A | Discharge status: Home / Self Care | Payer: No Typology Code available for payment source | Attending: Plastic Surgery | Admitting: Plastic Surgery

## 2021-03-13 DIAGNOSIS — I10 Essential (primary) hypertension: Secondary | ICD-10-CM | POA: Insufficient documentation

## 2021-03-13 DIAGNOSIS — M950 Acquired deformity of nose: Secondary | ICD-10-CM

## 2021-03-13 DIAGNOSIS — Z85828 Personal history of other malignant neoplasm of skin: Secondary | ICD-10-CM

## 2021-03-13 DIAGNOSIS — Z428 Encounter for other plastic and reconstructive surgery following medical procedure or healed injury: Secondary | ICD-10-CM | POA: Insufficient documentation

## 2021-03-13 DIAGNOSIS — D049 Carcinoma in situ of skin, unspecified: Secondary | ICD-10-CM | POA: Insufficient documentation

## 2021-03-13 HISTORY — PX: NASAL RECONSTRUCTION: SHX2069

## 2021-03-13 SURGERY — RECONSTRUCTION, NOSE
Anesthesia: General | Site: Nose | Laterality: Right

## 2021-03-13 MED ORDER — DEXAMETHASONE SODIUM PHOSPHATE 10 MG/ML IJ SOLN
INTRAMUSCULAR | Status: DC | PRN
  Administered 2021-03-13: 5 mg via INTRAVENOUS

## 2021-03-13 MED ORDER — BACITRACIN-NEOMYCIN-POLYMYXIN OINTMENT TUBE
TOPICAL_OINTMENT | CUTANEOUS | Status: AC
  Filled 2021-03-13: qty 14.17

## 2021-03-13 MED ORDER — MIDAZOLAM HCL 2 MG/2ML IJ SOLN
INTRAMUSCULAR | Status: AC
  Filled 2021-03-13: qty 2

## 2021-03-13 MED ORDER — LIDOCAINE-EPINEPHRINE 1 %-1:100000 IJ SOLN
INTRAMUSCULAR | Status: AC
  Filled 2021-03-13: qty 1

## 2021-03-13 MED ORDER — LIDOCAINE 2% (20 MG/ML) 5 ML SYRINGE
INTRAMUSCULAR | Status: DC | PRN
Start: 2021-03-13 — End: 2021-03-13
  Administered 2021-03-13: 40 mg via INTRAVENOUS

## 2021-03-13 MED ORDER — BACITRACIN ZINC 500 UNIT/GM EX OINT
TOPICAL_OINTMENT | CUTANEOUS | Status: DC | PRN
  Administered 2021-03-13: 1 via TOPICAL

## 2021-03-13 MED ORDER — CHLORHEXIDINE GLUCONATE CLOTH 2 % EX PADS: 6.0000 | MEDICATED_PAD | Freq: Once | CUTANEOUS | Status: DC

## 2021-03-13 MED ORDER — FENTANYL CITRATE (PF) 100 MCG/2ML IJ SOLN
25.0000 ug | INTRAMUSCULAR | Status: DC | PRN
  Administered 2021-03-13: 50 ug via INTRAVENOUS

## 2021-03-13 MED ORDER — FENTANYL CITRATE (PF) 250 MCG/5ML IJ SOLN
INTRAMUSCULAR | Status: DC | PRN
  Administered 2021-03-13 (×2): 25 ug via INTRAVENOUS
  Administered 2021-03-13: 50 ug via INTRAVENOUS
  Administered 2021-03-13: 25 ug via INTRAVENOUS

## 2021-03-13 MED ORDER — LIDOCAINE-EPINEPHRINE 1 %-1:100000 IJ SOLN
INTRAMUSCULAR | Status: DC | PRN
  Administered 2021-03-13: 10 mL

## 2021-03-13 MED ORDER — LACTATED RINGERS IV SOLN: INTRAVENOUS | Status: DC | PRN

## 2021-03-13 MED ORDER — PROPOFOL 10 MG/ML IV BOLUS
INTRAVENOUS | Status: AC
  Filled 2021-03-13: qty 20

## 2021-03-13 MED ORDER — OXYMETAZOLINE HCL 0.05 % NA SOLN
NASAL | Status: AC
  Filled 2021-03-13: qty 30

## 2021-03-13 MED ORDER — MIDAZOLAM HCL 2 MG/2ML IJ SOLN
INTRAMUSCULAR | Status: DC | PRN
Start: 2021-03-13 — End: 2021-03-13
  Administered 2021-03-13: 2 mg via INTRAVENOUS

## 2021-03-13 MED ORDER — FENTANYL CITRATE (PF) 250 MCG/5ML IJ SOLN
INTRAMUSCULAR | Status: AC
  Filled 2021-03-13: qty 5

## 2021-03-13 MED ORDER — FENTANYL CITRATE (PF) 100 MCG/2ML IJ SOLN
INTRAMUSCULAR | Status: AC
  Filled 2021-03-13: qty 2

## 2021-03-13 MED ORDER — BACITRACIN ZINC 500 UNIT/GM EX OINT
TOPICAL_OINTMENT | CUTANEOUS | Status: AC
  Filled 2021-03-13: qty 28.35

## 2021-03-13 MED ORDER — CEFAZOLIN SODIUM-DEXTROSE 2-4 GM/100ML-% IV SOLN
2.0000 g | INTRAVENOUS | Status: AC
  Administered 2021-03-13: 2 g via INTRAVENOUS
  Filled 2021-03-13: qty 100

## 2021-03-13 MED ORDER — PROPOFOL 10 MG/ML IV BOLUS
INTRAVENOUS | Status: DC | PRN
  Administered 2021-03-13: 80 mg via INTRAVENOUS

## 2021-03-13 MED ORDER — OXYCODONE HCL 5 MG PO TABS: 5.0000 mg | ORAL_TABLET | Freq: Once | ORAL | Status: DC | PRN

## 2021-03-13 MED ORDER — ROCURONIUM BROMIDE 10 MG/ML (PF) SYRINGE
PREFILLED_SYRINGE | INTRAVENOUS | Status: DC | PRN
Start: 2021-03-13 — End: 2021-03-13
  Administered 2021-03-13: 30 mg via INTRAVENOUS
  Administered 2021-03-13 (×2): 20 mg via INTRAVENOUS

## 2021-03-13 MED ORDER — HYDROCODONE-ACETAMINOPHEN 5-325 MG PO TABS: 1.0000 | ORAL_TABLET | Freq: Four times a day (QID) | ORAL | 0 refills | Status: AC | PRN

## 2021-03-13 MED ORDER — BSS IO SOLN
INTRAOCULAR | Status: AC
  Filled 2021-03-13: qty 15

## 2021-03-13 MED ORDER — ONDANSETRON HCL 4 MG/2ML IJ SOLN
INTRAMUSCULAR | Status: DC | PRN
Start: 2021-03-13 — End: 2021-03-13
  Administered 2021-03-13: 4 mg via INTRAVENOUS

## 2021-03-13 MED ORDER — CHLORHEXIDINE GLUCONATE 0.12 % MT SOLN
OROMUCOSAL | Status: AC
  Administered 2021-03-13: 15 mL
  Filled 2021-03-13: qty 15

## 2021-03-13 MED ORDER — SUGAMMADEX SODIUM 200 MG/2ML IV SOLN
INTRAVENOUS | Status: DC | PRN
Start: 2021-03-13 — End: 2021-03-13
  Administered 2021-03-13: 200 mg via INTRAVENOUS

## 2021-03-13 MED ORDER — OXYCODONE HCL 5 MG/5ML PO SOLN: 5.0000 mg | Freq: Once | ORAL | Status: DC | PRN

## 2021-03-13 MED ORDER — PHENYLEPHRINE HCL-NACL 20-0.9 MG/250ML-% IV SOLN
INTRAVENOUS | Status: DC | PRN
  Administered 2021-03-13: 20 ug/min via INTRAVENOUS

## 2021-03-13 MED ORDER — ONDANSETRON HCL 4 MG/2ML IJ SOLN: 4.0000 mg | Freq: Four times a day (QID) | INTRAMUSCULAR | Status: DC | PRN

## 2021-03-13 SURGICAL SUPPLY — 64 items
ADH SKN CLS APL DERMABOND .7 (GAUZE/BANDAGES/DRESSINGS)
APL SKNCLS STERI-STRIP NONHPOA (GAUZE/BANDAGES/DRESSINGS) ×1
BAG COUNTER SPONGE SURGICOUNT (BAG) ×1 IMPLANT
BAG SPNG CNTER NS LX DISP (BAG) ×1
BALL CTTN LRG ABS STRL LF (GAUZE/BANDAGES/DRESSINGS)
BENZOIN TINCTURE PRP APPL 2/3 (GAUZE/BANDAGES/DRESSINGS) ×1 IMPLANT
BLADE HEX COATED 2.75 (ELECTRODE) IMPLANT
BLADE SURG 15 STRL LF DISP TIS (BLADE) ×2 IMPLANT
BLADE SURG 15 STRL SS (BLADE) ×2
BNDG GAUZE ELAST 4 BULKY (GAUZE/BANDAGES/DRESSINGS) IMPLANT
CANISTER SUCT 1200ML W/VALVE (MISCELLANEOUS) ×3 IMPLANT
COTTONBALL LRG STERILE PKG (GAUZE/BANDAGES/DRESSINGS) IMPLANT
COVER SURGICAL LIGHT HANDLE (MISCELLANEOUS) ×2 IMPLANT
DECANTER SPIKE VIAL GLASS SM (MISCELLANEOUS) ×2 IMPLANT
DERMABOND ADVANCED (GAUZE/BANDAGES/DRESSINGS)
DERMABOND ADVANCED .7 DNX12 (GAUZE/BANDAGES/DRESSINGS) IMPLANT
ELECT COATED BLADE 2.86 ST (ELECTRODE) ×1 IMPLANT
ELECT NDL BLADE 2-5/6 (NEEDLE) ×2 IMPLANT
ELECT NEEDLE BLADE 2-5/6 (NEEDLE) IMPLANT
ELECT REM PT RETURN 9FT ADLT (ELECTROSURGICAL) ×2
ELECTRODE REM PT RTRN 9FT ADLT (ELECTROSURGICAL) ×2 IMPLANT
GAUZE SPONGE 2X2 8PLY STRL LF (GAUZE/BANDAGES/DRESSINGS) IMPLANT
GAUZE SPONGE 4X4 12PLY STRL LF (GAUZE/BANDAGES/DRESSINGS) ×2 IMPLANT
GAUZE VASELINE FOILPK 1/2 X 72 (GAUZE/BANDAGES/DRESSINGS) IMPLANT
GAUZE XEROFORM 1X8 LF (GAUZE/BANDAGES/DRESSINGS) IMPLANT
GAUZE XEROFORM 5X9 LF (GAUZE/BANDAGES/DRESSINGS) IMPLANT
GLOVE SRG 8 PF TXTR STRL LF DI (GLOVE) ×2 IMPLANT
GLOVE SURG ENC MOIS LTX SZ6.5 (GLOVE) IMPLANT
GLOVE SURG ENC TEXT LTX SZ7.5 (GLOVE) ×4 IMPLANT
GLOVE SURG UNDER POLY LF SZ8 (GLOVE) ×2
GOWN SRG XL LVL 4 BRTHBL STRL (GOWNS) IMPLANT
GOWN STRL NON-REIN XL LVL4 (GOWNS) ×2
GOWN STRL REUS W/ TWL LRG LVL3 (GOWN DISPOSABLE) ×4 IMPLANT
GOWN STRL REUS W/TWL LRG LVL3 (GOWN DISPOSABLE) ×4
HEMOSTAT SURGICEL 2X14 (HEMOSTASIS) IMPLANT
KIT BASIN OR (CUSTOM PROCEDURE TRAY) ×3 IMPLANT
MARKER SKIN DUAL TIP RULER LAB (MISCELLANEOUS) ×1 IMPLANT
NDL PRECISIONGLIDE 27X1.5 (NEEDLE) ×2 IMPLANT
NEEDLE PRECISIONGLIDE 27X1.5 (NEEDLE) ×2 IMPLANT
PACK EENT II TURBAN DRAPE (CUSTOM PROCEDURE TRAY) ×2 IMPLANT
PACK GENERAL/GYN (CUSTOM PROCEDURE TRAY) ×3 IMPLANT
PATTIES SURGICAL .5 X3 (DISPOSABLE) IMPLANT
PENCIL SMOKE EVACUATOR (MISCELLANEOUS) ×2 IMPLANT
SLEEVE SCD COMPRESS KNEE MED (STOCKING) ×3 IMPLANT
SPLINT NASAL DOYLE BI-VL (GAUZE/BANDAGES/DRESSINGS) IMPLANT
SPONGE GAUZE 2X2 STER 10/PKG (GAUZE/BANDAGES/DRESSINGS) ×1
STAPLER SKIN 35 WIDE (STAPLE) IMPLANT
STRIP CLOSURE SKIN 1/2X4 (GAUZE/BANDAGES/DRESSINGS) ×1 IMPLANT
SUT MNCRL 6-0 UNDY P1 1X18 (SUTURE) IMPLANT
SUT MNCRL AB 3-0 PS2 18 (SUTURE) IMPLANT
SUT MNCRL AB 4-0 PS2 18 (SUTURE) ×1 IMPLANT
SUT MON AB 4-0 PC3 18 (SUTURE) IMPLANT
SUT MON AB 5-0 P3 18 (SUTURE) ×4 IMPLANT
SUT MON AB 5-0 PS2 18 (SUTURE) IMPLANT
SUT MONOCRYL 6-0 P1 1X18 (SUTURE)
SUT PDS AB 3-0 SH 27 (SUTURE) IMPLANT
SUT PDS AB 4-0 RB1 27 (SUTURE) IMPLANT
SUT PROLENE 5 0 P 3 (SUTURE) IMPLANT
SUT PROLENE 5 0 PS 2 (SUTURE) IMPLANT
SUT PROLENE 6 0 P 1 18 (SUTURE) IMPLANT
SUT VIC AB 5-0 PS2 18 (SUTURE) IMPLANT
SUT VICRYL 4-0 PS2 18IN ABS (SUTURE) IMPLANT
SYR BULB EAR ULCER 3OZ GRN STR (SYRINGE) ×2 IMPLANT
TOWEL GREEN STERILE FF (TOWEL DISPOSABLE) ×3 IMPLANT

## 2021-03-13 NOTE — Interval H&P Note (Signed)
Patient seen and examined. Risks and benefits discussed. Proceed with surgery.

## 2021-03-13 NOTE — Discharge Instructions (Addendum)
Activity: As tolerated, but avoid strenuous activity until follow up visit.  Diet: Regular  Wound Care: Keep dressing clean & dry for 2 days. After that you can shower normally.  Redress the wound as needed for comfort.  Recommend Vaseline over the incisions once daily to help keep them moist.  You have Steri-Strips on left chest wall from harvesting rib cartilage for your nasal reconstruction.  Please keep them in place.    Special Instructions:  Call our office if any unusual problems occur such as pain, excessive bleeding, unrelieved nausea/vomiting, fever &/or chills.  Follow-up appointment: Scheduled for next week.

## 2021-03-13 NOTE — Anesthesia Procedure Notes (Addendum)
Procedure Name: Intubation Date/Time: 03/13/2021 4:52 PM Performed by: Lorie Phenix, CRNA Pre-anesthesia Checklist: Patient identified, Emergency Drugs available, Suction available and Patient being monitored Patient Re-evaluated:Patient Re-evaluated prior to induction Oxygen Delivery Method: Circle system utilized Preoxygenation: Pre-oxygenation with 100% oxygen Induction Type: IV induction Ventilation: Mask ventilation without difficulty and Two handed mask ventilation required Laryngoscope Size: Glidescope and 3 Grade View: Grade I Tube type: Oral Tube size: 7.0 mm Number of attempts: 1 Airway Equipment and Method: Rigid stylet Placement Confirmation: ETT inserted through vocal cords under direct vision, positive ETCO2 and breath sounds checked- equal and bilateral Secured at: 2 cm Tube secured with: Tape Dental Injury: Teeth and Oropharynx as per pre-operative assessment  Difficulty Due To: Difficulty was anticipated

## 2021-03-13 NOTE — Anesthesia Preprocedure Evaluation (Addendum)
Anesthesia Evaluation  Patient identified by MRN, date of birth, ID band Patient awake    Reviewed: Allergy & Precautions, NPO status , Patient's Chart, lab work & pertinent test results  Airway Mallampati: III  TM Distance: >3 FB Neck ROM: Full    Dental  (+) Dental Advisory Given   Pulmonary COPD, Current SmokerPatient did not abstain from smoking.,    Pulmonary exam normal breath sounds clear to auscultation       Cardiovascular hypertension, Pt. on medications and Pt. 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  Reproductive/Obstetrics                            Anesthesia Physical  Anesthesia Plan  ASA: 3  Anesthesia Plan: General   Post-op Pain Management: Minimal or no pain anticipated and Tylenol PO (pre-op)   Induction: Intravenous  PONV Risk Score and Plan: 2 and Ondansetron, Dexamethasone, Midazolam and Treatment may vary due to age or medical condition  Airway Management Planned: Oral ETT and Video Laryngoscope Planned  Additional Equipment: None  Intra-op Plan:   Post-operative Plan: Extubation in OR  Informed Consent: I have reviewed the patients History and Physical, chart, labs and discussed the procedure including the risks, benefits and alternatives for the proposed anesthesia with the patient or authorized representative who has indicated his/her understanding and acceptance.     Dental advisory given  Plan Discussed with: CRNA  Anesthesia Plan  Comments:        Anesthesia Quick Evaluation

## 2021-03-13 NOTE — Transfer of Care (Signed)
Immediate Anesthesia Transfer of Care Note  Patient: Melanie Harris  Procedure(s) Performed: Division and Inset forehead flap, with nasal reconstruction with flaps and grafts as necessary, left rib cartilage graft. (Right: Nose)  Patient Location: PACU  Anesthesia Type:General  Level of Consciousness: awake and alert   Airway & Oxygen Therapy: Patient Spontanous Breathing  Post-op Assessment: Report given to RN and Post -op Vital signs reviewed and stable  Post vital signs: Reviewed and stable  Last Vitals:  Vitals Value Taken Time  BP 135/84 03/13/21 1856  Temp    Pulse 83 03/13/21 1901  Resp 13 03/13/21 1901  SpO2 98 % 03/13/21 1901  Vitals shown include unvalidated device data.  Last Pain:  Vitals:   03/13/21 1332  TempSrc:   PainSc: 5       Patients Stated Pain Goal: 2 (44/62/86 [id number])  Complications: No notable events documented.

## 2021-03-13 NOTE — Op Note (Signed)
Operative Note   DATE OF OPERATION: 03/13/2021  SURGICAL DEPARTMENT: Plastic Surgery  PREOPERATIVE DIAGNOSES: Complex nasal reconstruction for cancer  POSTOPERATIVE DIAGNOSES:  same  PROCEDURE: 1.  Division and inset of the left paramedian forehead flap 2.  Division and inset right nasolabial flap 3.  Use of rib cartilage graft for nasal dorsum support  SURGEON: Talmadge Coventry, MD  ASSISTANT: Krista Blue, PA The advanced practice practitioner (APP) assisted throughout the case.  The APP was essential in retraction and counter traction when needed to make the case progress smoothly.  This retraction and assistance made it possible to see the tissue planes for the procedure.  The assistance was needed for hemostasis, tissue re-approximation and closure of the incision site.   ANESTHESIA:  General.   COMPLICATIONS: None.   INDICATIONS FOR PROCEDURE:  The patient, Melanie Harris is a 61 y.o. female born on [birth year], is here for treatment of nasal reconstruction MRN: [id number]  CONSENT:  Informed consent was obtained directly from the patient. Risks, benefits and alternatives were fully discussed. Specific risks including but not limited to bleeding, infection, hematoma, seroma, scarring, pain, contracture, asymmetry, wound healing problems, and need for further surgery were all discussed. The patient did have an ample opportunity to have questions answered to satisfaction.   DESCRIPTION OF PROCEDURE:  The patient was taken to the operating room. SCDs were placed and antibiotics were given.  General anesthesia was administered.  The patient's operative site was prepped and draped in a sterile fashion. A time out was performed and all information was confirmed to be correct.  Started by evaluating the reconstruction thus far.  The forehead flap appeared healthy and well integrated.  The right nasolabial flap which I and had intended to walk into the septum had integrated nicely as  well into the upper lip.  The septum had actually started to heal up and some of the cartilage graft did not cooperate and the remainder of it seemed to have slough off.  There was good tip support however with the amount that took.  I elected to move forward with division and inset of forehead flap.  I incised through the base of the pedicle with a 15 blade.  There was healthy bleeding from both sides.  The eyebrow pedicle site was trimmed and tailored to elevate the medial aspect of the brow to a symmetric location was closed with 5-0 Monocryl sutures.  The nasal aspect of the flap was then incised.  It was debulked as much as I felt comfortable.  I then divided the pedicle of the right nasolabial flap.  It bled well from each side.  The cheek donor site was tailored to appropriate contour and closed with interrupted 5-0 Monocryl sutures.  The recipient site in the upper lip was then excised and was able to close this primarily with interrupted 5-0 Monocryl sutures.  I then turned my attention to the left chest.  I palpated along what appeared to be the sixth rib.  I made an incision along this medially and dissected down to the rib cartilage itself.  I incised along the upper border with a 15 blade then used a freer elevator to elevate the perichondrium off the cartilage.  15 blade was then used to shave off the superficial aspect of the rib cartilage to use as a graft.  Hemostasis was obtained and the rib donor site skin was closed with interrupted 4-0 Monocryl sutures and a running 4 Monocryl subcuticular.  The rib  cartilage graft was then trimmed and shape to the appropriate shape and placed within the pocket beneath the dorsal nasal skin to fix a depression that appear to be forming in that area.  The nasal dorsal skin and the forehead flap skin were then closed with interrupted 5-0 Monocryl sutures.  This gave a nice on table result.  The patient tolerated the procedure well.  There were no complications.  The patient was allowed to wake from anesthesia, extubated and taken to the recovery room in satisfactory condition.

## 2021-03-14 ENCOUNTER — Encounter (HOSPITAL_COMMUNITY): Payer: Self-pay | Admitting: Plastic Surgery

## 2021-03-15 ENCOUNTER — Telehealth: Payer: Self-pay

## 2021-03-15 NOTE — Telephone Encounter (Signed)
Returned patients call. She has soaked her gauze with normal saline and will not come off. It is stuck to some of the sutures. She does not feel comfortable cutting the gauze off herself. Appointment made with Dr. Claudia Desanctis tomorrow at 9:15am

## 2021-03-15 NOTE — Telephone Encounter (Signed)
Patient said stitches on the side of her nose are attached to the gauze and she can't get the gauze off and it's very uncomfortable.  Please call.

## 2021-03-15 NOTE — Telephone Encounter (Signed)
Patient called she stated she had surgery this Monday and she has a couple of questions regarding her nose , patient is requesting a call [phone]

## 2021-03-15 NOTE — Anesthesia Postprocedure Evaluation (Signed)
Anesthesia Post Note  Patient: Melanie Harris  Procedure(s) Performed: Division and Inset forehead flap, with nasal reconstruction with flaps and grafts as necessary, left rib cartilage graft. (Right: Nose)     Patient location during evaluation: PACU Anesthesia Type: General Level of consciousness: awake and alert Pain management: pain level controlled Vital Signs Assessment: post-procedure vital signs reviewed and stable Respiratory status: spontaneous breathing, nonlabored ventilation, respiratory function stable and patient connected to nasal cannula oxygen Cardiovascular status: blood pressure returned to baseline and stable Postop Assessment: no apparent nausea or vomiting Anesthetic complications: no   No notable events documented.  Last Vitals:  Vitals:   03/13/21 1930 03/13/21 1945  BP: 139/68 123/65  Pulse: 74 76  Resp: 16 13  Temp:  36.7 C  SpO2: 99% 97%    Last Pain:  Vitals:   03/13/21 1945  TempSrc:   PainSc: 0-No pain                 Emmarose Klinke S

## 2021-03-16 ENCOUNTER — Other Ambulatory Visit: Payer: Self-pay

## 2021-03-16 ENCOUNTER — Ambulatory Visit (INDEPENDENT_AMBULATORY_CARE_PROVIDER_SITE_OTHER): Payer: No Typology Code available for payment source | Admitting: Plastic Surgery

## 2021-03-16 ENCOUNTER — Ambulatory Visit: Payer: No Typology Code available for payment source

## 2021-03-16 DIAGNOSIS — D049 Carcinoma in situ of skin, unspecified: Secondary | ICD-10-CM

## 2021-03-16 NOTE — Progress Notes (Signed)
Patient presents postop from division and inset of forehead and nasolabial flaps and cartilage graft for the nasal dorsum.  She feels like things are going reasonably well.  She has some gauze stuck to her sutures but we were able to remove that today.  All of her flaps look viable and she has reasonable nasal shape overall.  There is some crusting and scabbing along incision lines but this was all able to be cleaned up reasonably well.  I do suspect that her forehead flap would benefit from a thinning out procedure at some point in the future but given the number of surgeries she has had in the past couple months she is ready for a break.  We will plan to see her again in 3 weeks to check her progress.  All of her questions were answered.

## 2021-03-16 NOTE — Telephone Encounter (Signed)
Pt is being seen in office today by Dr. Claudia Desanctis.

## 2021-03-22 ENCOUNTER — Encounter: Payer: No Typology Code available for payment source | Admitting: Plastic Surgery

## 2021-03-22 ENCOUNTER — Telehealth: Payer: Self-pay

## 2021-03-22 NOTE — Telephone Encounter (Signed)
Returned patients call. On 03/13/2021, division and inset of the left paramedian forehead flap, division and inset right nasolabial flap use of rib cartilage graft for nasal dorsum support by Dr. Claudia Desanctis. Today she has a lot of dried crust/mucus in BL nares, yellowish/green drainage from the incision site of the forehead and top of nose. Denies any fever, chills, nausea and vomiting. She cleans it TID and applies ABX ointment that was provided by the hospital. Advised she may use OTC nasal saline rinse to help with dried up crust in her nose. Per Dr. Claudia Desanctis, patient may schedule sooner if she prefers. Appointment is made with Dr. Claudia Desanctis tomorrow at 2:30 for further assessment.

## 2021-03-22 NOTE — Telephone Encounter (Signed)
Patient called to report increased drainage and pus that is brown/green from her nose and forehead. She notes it appears to be getting worse.

## 2021-03-23 ENCOUNTER — Ambulatory Visit (INDEPENDENT_AMBULATORY_CARE_PROVIDER_SITE_OTHER): Payer: No Typology Code available for payment source | Admitting: Plastic Surgery

## 2021-03-23 ENCOUNTER — Other Ambulatory Visit: Payer: Self-pay

## 2021-03-23 DIAGNOSIS — D049 Carcinoma in situ of skin, unspecified: Secondary | ICD-10-CM

## 2021-03-23 MED ORDER — SULFAMETHOXAZOLE-TRIMETHOPRIM 800-160 MG PO TABS
1.0000 | ORAL_TABLET | Freq: Two times a day (BID) | ORAL | 0 refills | Status: DC
  Filled 2021-03-23: qty 28, 14d supply, fill #0

## 2021-03-23 NOTE — Progress Notes (Signed)
Patient presents 2 weeks postop from division and inset of forehead flap for complex nasal reconstruction.  She felt that she noticed yesterday that there was some drainage coming from her incision on the dorsum of her nose she would like to have it evaluated.  On exam there looks to be a small dehiscence of a portion of the inset portion of her forehead flap.  There is some purulent drainage coming from the wound beneath.  There is minimal surrounding erythema and the remainder of all of the flaps and reconstruction look to be healthy.  She also points out some swelling at her left rib donor site which is erythematous and tender.  I cannot say appreciate a definitive fluid collection or abscess in that location.  For the time being I am planning to start her on Bactrim and apply bacitracin topically to the nose dehiscence.  I am hopeful that there is enough surrounding healthy tissue that this should heal up okay in the end.  We will plan to see her next week to reevaluate.  All of her questions were answered.

## 2021-03-24 ENCOUNTER — Ambulatory Visit: Payer: No Typology Code available for payment source

## 2021-03-30 ENCOUNTER — Ambulatory Visit (INDEPENDENT_AMBULATORY_CARE_PROVIDER_SITE_OTHER): Payer: No Typology Code available for payment source | Admitting: Plastic Surgery

## 2021-03-30 ENCOUNTER — Other Ambulatory Visit: Payer: Self-pay

## 2021-03-30 DIAGNOSIS — D049 Carcinoma in situ of skin, unspecified: Secondary | ICD-10-CM

## 2021-03-30 NOTE — Progress Notes (Signed)
Patient presents as a follow-up for her nasal reconstruction.  She had a little bit of separation in the superior aspect of the forehead flap inset last week and some erythema around the left rib donor site.  I started her on Bactrim and she has noticed some improvement.  She did note some drainage from the left rib which was initially quite a bit but the drainage from both areas has tapered off significantly.  Neither area has much in the way of erythema today.  There is some dehiscence of the forehead flap inset superiorly but this seems to be stable and is not separating any further.  I would expect it to gradually heal by secondary intention and its only separated a millimeter or 2.  We will continue the Bactrim for another week and I will see her next Thursday.  All of her questions were answered.

## 2021-04-06 ENCOUNTER — Ambulatory Visit (INDEPENDENT_AMBULATORY_CARE_PROVIDER_SITE_OTHER): Payer: No Typology Code available for payment source | Admitting: Plastic Surgery

## 2021-04-06 ENCOUNTER — Other Ambulatory Visit: Payer: Self-pay

## 2021-04-06 DIAGNOSIS — D049 Carcinoma in situ of skin, unspecified: Secondary | ICD-10-CM

## 2021-04-06 NOTE — Progress Notes (Signed)
Patient presents in follow-up for an infection after the third stage of her nasal reconstruction.  She feels like things are heading in the right direction.  She finishes her antibiotics today.  On exam things look to be improving.  There is minimal to no drainage at either place.  Minimal surrounding erythema on the nose and in the rib.  There is still a 2 to 3 mm dehiscence in the superior aspect of the forehead flap but this is starting to granulate and on the edges and is still being held in position.  We will plan to continue observation for another 2 weeks and see how things go.  She knows to call with any worsening or changes in her condition.  All of her questions were answered. ?

## 2021-04-11 ENCOUNTER — Other Ambulatory Visit: Payer: Self-pay

## 2021-04-12 ENCOUNTER — Other Ambulatory Visit: Payer: Self-pay

## 2021-04-13 ENCOUNTER — Telehealth: Payer: Self-pay

## 2021-04-13 NOTE — Telephone Encounter (Signed)
Patient called in stating she woke up this morning and her face is still very swollen, believes she still has an infection. Asked for a call back for advice.  ?

## 2021-04-13 NOTE — Telephone Encounter (Signed)
Patient called to say her face is swelling on the right side and it's red.  She said the infection is getting worse in her nose and ribs.  Patient is really concerned.  Please call. ?

## 2021-04-14 ENCOUNTER — Other Ambulatory Visit: Payer: Self-pay

## 2021-04-14 ENCOUNTER — Ambulatory Visit (INDEPENDENT_AMBULATORY_CARE_PROVIDER_SITE_OTHER): Payer: No Typology Code available for payment source | Admitting: Physician Assistant

## 2021-04-14 DIAGNOSIS — D049 Carcinoma in situ of skin, unspecified: Secondary | ICD-10-CM

## 2021-04-14 NOTE — Progress Notes (Signed)
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 Claudia Desanctis. ? ?During surgery, rib cartilage was used as graft for nasal dorsum support.  She then had division and inset of both the left paramedian forehead flap and right nasolabial flap.  Her recovery has been complicated by an infection.  There had been a little bit of separation in the superior aspect of the forehead flap inset and erythema and drainage from left rib.  She was seen most recently on 04/06/2021.  At that time, she was on her last day of Bactrim antibiotics and had improved considerably.   ? ?Today, patient is accompanied by her sister at bedside.  She states that she is changing the dressing at her rib harvest site every 4 hours.  She has been applying bacitracin to that site as well as to her face.  She states that she developed a rash at rib harvest site that is red, painful.  Concerned about infection.  She also reports malar distribution erythema and some infraorbital edema, mostly on right side.  She states that she has stitches that remain that are uncomfortable.  She is worried that perhaps she is having recurrence in her infection and wanted to be evaluated.  Denies any fevers, endorses fatigue. ? ?On physical exam, left rib harvest site was moist, erythematous rash overlying repair site.  Rash consistent with an irritant dermatitis.  There was however a small amount of purulence that was expressed on exam.  Small hole from which was expressed was left open.  Area minimally tender to palpation.  No fluctuance or crepitus.  As for the face, sutures noted at right nasolabial flap with small amount of purulence that was expressed at time of suture removal.  There is mild infraorbital edema, most notably on right side.  Light erythematous macular rash in the distribution.  Left nare with hardened exudate.  No  significant facial erythema or obvious cellulitic findings. ? ?Suspected that perhaps tape was contributing to her facial erythema, but patient adamantly denies any ongoing dressings.  She has however been using bacitracin daily.  Encouraging to discontinue at this time.  We will start her on Keflex x7 days.  She had also been applying bacitracin to her left rib harvest site, but given the significant amount of drainage, suspect that the moisture is causing irritant dermatitis that appears similar to a diaper dermatitis.  Recommending that she change to 4 x 4 gauze and paper tape, with frequent dressing changes as needed for moisture control.  I also recommend cold compress to site twice daily to help facilitate continued drainage.  Patient fortunately has been tobacco and nicotine free since 01/2021. ? ?Picture(s) obtained of the patient and placed in the chart were with the patient's or guardian's permission.  She will return to clinic next week for her appointment as scheduled with Dr. Claudia Desanctis. ?

## 2021-04-17 ENCOUNTER — Telehealth: Payer: Self-pay

## 2021-04-17 ENCOUNTER — Other Ambulatory Visit: Payer: Self-pay

## 2021-04-17 MED ORDER — CEPHALEXIN 500 MG PO CAPS: 500.0000 mg | ORAL_CAPSULE | Freq: Three times a day (TID) | ORAL | 0 refills | Status: AC

## 2021-04-17 NOTE — Telephone Encounter (Signed)
Spoke with patient and pharm didn't receive the rx script. I spoke with Donna Christen about resending-he stated he will ask pt because [facility]'t be able to get it today. He will send it to the pharm of patient's choice. ?

## 2021-04-17 NOTE — Telephone Encounter (Signed)
[facility] - Didn't know if patient wanted the abx called to a different pharm or a different abx. ?

## 2021-04-17 NOTE — Telephone Encounter (Signed)
Patient states pharmacy does not have antibiotics that were supposed to be sent in on Friday. Asked for a returned phone call.  ?

## 2021-04-17 NOTE — Addendum Note (Signed)
Addended by: Krista Blue on: 04/17/2021 04:21 PM ? ? Modules accepted: Orders ? ?

## 2021-04-19 ENCOUNTER — Other Ambulatory Visit: Payer: Self-pay

## 2021-04-19 ENCOUNTER — Ambulatory Visit (INDEPENDENT_AMBULATORY_CARE_PROVIDER_SITE_OTHER): Payer: No Typology Code available for payment source | Admitting: Plastic Surgery

## 2021-04-19 DIAGNOSIS — D049 Carcinoma in situ of skin, unspecified: Secondary | ICD-10-CM

## 2021-04-19 NOTE — Progress Notes (Signed)
Patient presents in follow-up for her nasal reconstruction.  She developed an infection in her nose and rib donor site which we have been treating.  She had noted some worsening drainage last week and we started her on Keflex and she feels like she has noted a pretty significant improvement..  On exam the nose is dried up with little to no drainage at all.  There is still a small amount of drainage at a pinpoint spot along the rib incision surrounded by some hypergranulation tissue which I cauterized with silver nitrate today.  I suspect that this will continue to move in the right direction as she is indicating today.  We will plan to see her in 2 weeks for follow-up.  All of her questions were answered.

## 2021-04-21 NOTE — Telephone Encounter (Signed)
Called and spoke with the patient on (04/13/21) regarding the message below.  Patient stated that she's having light oozing the last couple of days again.  Her face and nose red.  Patient stated that she feels more infection is coming.  She also stated that the rib is red and she feels the infection is coming out of the rib.   ? ?Informed the patient that we spoke with the provider and they would like for her to come in to follow-up with Garrett,PA-C on (04/14/21 @ 1:00pm).  Patient verbalized understanding and agreed.//AB/CMA ?

## 2021-04-21 NOTE — Telephone Encounter (Signed)
See previous note.//AB/CMA ?

## 2021-05-03 ENCOUNTER — Ambulatory Visit (INDEPENDENT_AMBULATORY_CARE_PROVIDER_SITE_OTHER): Payer: No Typology Code available for payment source | Admitting: Plastic Surgery

## 2021-05-03 ENCOUNTER — Other Ambulatory Visit: Payer: Self-pay

## 2021-05-03 DIAGNOSIS — D049 Carcinoma in situ of skin, unspecified: Secondary | ICD-10-CM

## 2021-05-03 NOTE — Progress Notes (Signed)
Patient presents in follow-up for nasal reconstruction.  She feels like things are coming along okay.  She is still having drainage from the left rib donor site.  On exam the nose looks to be doing well.  I do not see any additional erythema or drainage in that area.  She still has a scab over the opening and small dehiscence that is 2 to 3 mm wide but this seems stable overall.  The rib still has some exposed granulation tissue that is draining and causing skin irritation due to moisture.  Given that the rib site is taking a particularly long time to heal of recommended excision of this area to remove the protruding granulation tissue and hopefully stimulate a improved healing response.  I would plan to loosely close it.  We will plan to do this in the next few days.  Otherwise things are coming along okay.  All of her questions were answered. ?

## 2021-05-05 ENCOUNTER — Other Ambulatory Visit (HOSPITAL_COMMUNITY)
Admission: RE | Admit: 2021-05-05 | Discharge: 2021-05-05 | Disposition: A | Discharge status: Home / Self Care | Payer: No Typology Code available for payment source | Source: Ambulatory Visit | Attending: Plastic Surgery | Admitting: Plastic Surgery

## 2021-05-05 ENCOUNTER — Ambulatory Visit (INDEPENDENT_AMBULATORY_CARE_PROVIDER_SITE_OTHER): Payer: No Typology Code available for payment source | Admitting: Plastic Surgery

## 2021-05-05 ENCOUNTER — Encounter: Payer: Self-pay | Admitting: Plastic Surgery

## 2021-05-05 ENCOUNTER — Other Ambulatory Visit: Payer: Self-pay

## 2021-05-05 VITALS — BP 127/71 | HR 88

## 2021-05-05 DIAGNOSIS — S21102A Unspecified open wound of left front wall of thorax without penetration into thoracic cavity, initial encounter: Secondary | ICD-10-CM | POA: Insufficient documentation

## 2021-05-05 DIAGNOSIS — D049 Carcinoma in situ of skin, unspecified: Secondary | ICD-10-CM

## 2021-05-05 NOTE — Progress Notes (Signed)
Operative Note  ? ?DATE OF OPERATION: 05/05/2021 ? ?LOCATION:   ? ?SURGICAL DEPARTMENT: Plastic Surgery ? ?PREOPERATIVE DIAGNOSES:  Left chest wound  ? ?POSTOPERATIVE DIAGNOSES:  same ? ?PROCEDURE:  ?Excision of left chest wound measuring 4 cm ?Complex closure measuring 4 cm ? ?SURGEON: Talmadge Coventry, MD ? ?ANESTHESIA:  Local ? ?COMPLICATIONS: None.  ? ?INDICATIONS FOR PROCEDURE:  ?The patient, Melanie Harris is a 61 y.o. female born on 01/15/1961, is here for treatment of left chest wound sp infection after rib cartilage graft harvest ?MRN: [id number] ? ?CONSENT:  ?Informed consent was obtained directly from the patient. Risks, benefits and alternatives were fully discussed. Specific risks including but not limited to bleeding, infection, hematoma, seroma, scarring, pain, infection, wound healing problems, and need for further surgery were all discussed. The patient did have an ample opportunity to have questions answered to satisfaction.  ? ?DESCRIPTION OF PROCEDURE:  ?Local anesthesia was administered. The patient's operative site was prepped and draped in a sterile fashion. A time out was performed and all information was confirmed to be correct.  The lesion was excised with a 15 blade.  Hemostasis was obtained.  Circumferential undermining was performed and the skin was advanced and closed in layers with interrupted buried Monocryl sutures and 4-0 monocryl for the skin.  The lesion excised measured 4 cm, and the total length of closure measured 4 cm.   ? ?The patient tolerated the procedure well.  There were no complications. ?  ? ?

## 2021-05-08 ENCOUNTER — Telehealth: Payer: Self-pay

## 2021-05-08 LAB — SURGICAL PATHOLOGY

## 2021-05-08 NOTE — Telephone Encounter (Signed)
Patient called to say she is in a lot of pain in her rib area and ran a fever on-and-off over the weekend of 102 degrees. Please call.  ?

## 2021-05-09 ENCOUNTER — Other Ambulatory Visit: Payer: Self-pay

## 2021-05-09 NOTE — Telephone Encounter (Signed)
Called pt and informed her per [place], [place] to come into tomorrow to see Dr. Claudia Desanctis at 9 am or [place], [place] at 1 pm. Pt chose to come in at 1 pm and I have added pt to his schedule. Adv pt that if Sx worsen, to call the office and there is a provider on call to assist her. Pt conveyed understanding.  ?

## 2021-05-10 ENCOUNTER — Other Ambulatory Visit: Payer: Self-pay

## 2021-05-10 ENCOUNTER — Encounter: Payer: Self-pay | Admitting: Physician Assistant

## 2021-05-10 ENCOUNTER — Ambulatory Visit (INDEPENDENT_AMBULATORY_CARE_PROVIDER_SITE_OTHER): Payer: No Typology Code available for payment source | Admitting: Physician Assistant

## 2021-05-10 VITALS — BP 130/77 | HR 72 | Temp 98.5°F

## 2021-05-10 DIAGNOSIS — S21102A Unspecified open wound of left front wall of thorax without penetration into thoracic cavity, initial encounter: Secondary | ICD-10-CM

## 2021-05-10 MED ORDER — SULFAMETHOXAZOLE-TRIMETHOPRIM 800-160 MG PO TABS: 1.0000 | ORAL_TABLET | Freq: Two times a day (BID) | ORAL | 0 refills | Status: AC

## 2021-05-10 NOTE — Progress Notes (Signed)
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 Claudia Desanctis.  During surgery, rib cartilage was used as graft for nasal dorsum support.  She then had division and inset of both the left paramedian forehead flap and right nasolabial flap.   ? ?After being treated with antibiotics and silver nitrate cauterization for tissue hypertrophy at left rib cartilage harvest site, patient had complex closure performed 05/05/2021 by Dr. Claudia Desanctis in effort to help expedite her recovery.  Repair site was closed with 4-0 Monocryl. ? ?Today, she presents with concern for rib pain and fever Tmax of 102 ?F at home.  She tells me that her fevers have been waxing waning at home, responds well to antipyretics.  She has been complaining of considerable pain.  Her symptoms began 05/07/2021 and have been getting progressively worse.  She has noticed a lot of swelling and redness.  There has been purulent drainage and she feels as though her repair site has opened up.  There has been some diminished appetite.   ? ?Physical exam shows significant erythema and swelling in the area of repair site.  Fluctuance appreciated.  Some scant purulent drainage from incision even without palpation.  No large dehiscence. ? ?Sutures were removed and wound was opened up to allow for large I&D.  Copious amount of thick yellow purulent drainage from wound.  Continued to massage the area until all of the purulence was expressed and there was only residual bleeding.  Wound was packed with iodoform gauze packing.  Culture was obtained. ? ?Recommend daily iodoform gauze packing with overlying 4 x 4 gauze to help with continued drainage.  Will prescribe Bactrim.   ? ?Strict precautions given.  Emphasized importance of going straight to the ER should she develop any new or worsening symptoms.  She will call the clinic should she have  any questions.  Close follow-up next week.   ? ? ? ? ?

## 2021-05-11 ENCOUNTER — Ambulatory Visit: Payer: No Typology Code available for payment source | Admitting: Plastic Surgery

## 2021-05-11 ENCOUNTER — Other Ambulatory Visit: Payer: Self-pay | Admitting: Physician Assistant

## 2021-05-15 LAB — AEROBIC/ANAEROBIC CULTURE W GRAM STAIN (SURGICAL/DEEP WOUND): Gram Stain: NONE SEEN

## 2021-05-18 ENCOUNTER — Ambulatory Visit (INDEPENDENT_AMBULATORY_CARE_PROVIDER_SITE_OTHER): Payer: No Typology Code available for payment source | Admitting: Physician Assistant

## 2021-05-18 ENCOUNTER — Encounter: Payer: Self-pay | Admitting: Physician Assistant

## 2021-05-18 VITALS — BP 149/87 | HR 84 | Temp 97.9°F

## 2021-05-18 DIAGNOSIS — D049 Carcinoma in situ of skin, unspecified: Secondary | ICD-10-CM

## 2021-05-18 NOTE — Progress Notes (Signed)
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 Claudia Desanctis.  During surgery, rib cartilage was used as graft for nasal dorsum support.  She then had division and inset of both the left paramedian forehead flap and right nasolabial flap. ? ?After being treated with antibiotics and silver nitrate cauterization for tissue hypertrophy at left rib cartilage harvest site, patient had complex closure performed 05/05/2021 by Dr. Claudia Desanctis in effort to help expedite her recovery.  Repair site was closed with 4-0 Monocryl. ?  ?She was last seen here in clinic on 05/10/2021.  At that time, she complained of rib pain and fever Tmax 102 ?F at home.  On exam, there is a large area of fluctuance at rib closure site.  This was opened allowing for significant purulent drainage.  After the I&D, it was packed with iodoform gauze packing.  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  Strict ER precautions were provided.  Otherwise, plan was for follow-up in 1 week. ? ?Today, patient is doing well.  She states that her left-sided rib pain improved after incision and drainage last week.  She has not had any fevers.  Reports that if redness has also improved.  Had been packing the wound with iodoform gauze packing, but it has healed from the bottom up to the point that she they are no longer to pack it with gauze.  She also states that she has been having some yellowish-Brycin Kille drainage from her nasal reconstruction site.  She still has an additional 7 days of Bactrim remaining. ? ?Physical exam is reassuring.  Left rib cartilage harvest site appears to have improved tremendously.  No surrounding erythema or obvious fluctuance.  No subcutaneous fluid collections appreciated.  Good granular tissue at base.  Scant drainage.  Depth of only 0.5 cm.  As for  nasal reconstruction, there are multiple scabs/areas of superficial necrosis from which the patient is experiencing yellowish-Colbie Sliker drainage.  Suspect that the drainage is slough.  2 small scabs that were effectively falling off or removed here with forceps.  Underneath was nice, healthy appearing pink tissue.  There was a moderate amount of liquid slough underneath that was removed with gauze.  No surrounding redness or significant tenderness on exam.  Doubt infection.  See picture. ? ?Recommending daily Xeroform to the rib cartilage harvest site as well as to the nose once daily followed by gauze and Medipore tape.  She inquires about possible debulking of the left side of the nose as it is difficult to breathe through her left nare.  We will have her follow-up with Dr. Claudia Desanctis in a couple of weeks to discuss whether she would be a viable candidate for debulking procedure. ? ?Picture(s) obtained of the patient and placed in the chart were with the patient's or guardian's permission. ? ? ? ?

## 2021-05-19 ENCOUNTER — Telehealth: Payer: Self-pay

## 2021-05-19 NOTE — Telephone Encounter (Signed)
Josh, Passenger transport manager with [employer], called regarding wound care supplies to be billed through patient's insurance.  He asked that we please call to verify information  He said it looks like there was a financial application that was approved through [facility] from 08/29/2020-03/02/2021.  He would like to know if there is updated information or insurance on her end that we can try to bill for these supplies.  Please call. ?

## 2021-05-22 ENCOUNTER — Telehealth: Payer: Self-pay | Admitting: Plastic Surgery

## 2021-05-22 NOTE — Telephone Encounter (Signed)
Spoke to [employer]. I informed patient was unable to pay out of pocket for reimbursement. He stated he could bill the financial assistance through [facility]. He will check with them and call me back. ?

## 2021-05-22 NOTE — Telephone Encounter (Signed)
Pt is calling in stating that she has pain in her L side rib area and thinks that she has some type of infection and would like to have a call back to let her know what she should do. ?

## 2021-05-24 NOTE — Telephone Encounter (Signed)
Pt has a current appt for 05/31/21 with Dr. Claudia Desanctis. I will see if there is a date sooner we can schedule due to the severe problems that are ongoing.  ?

## 2021-05-31 ENCOUNTER — Ambulatory Visit (INDEPENDENT_AMBULATORY_CARE_PROVIDER_SITE_OTHER): Payer: No Typology Code available for payment source | Admitting: Plastic Surgery

## 2021-05-31 DIAGNOSIS — D049 Carcinoma in situ of skin, unspecified: Secondary | ICD-10-CM

## 2021-05-31 NOTE — Progress Notes (Signed)
Patient presents postop from nasal reconstruction.  She feels like things are heading in the right direction.  In regards to her nose this is healing up and has some small scabbing remaining but is doing well with no signs of infection.  The flap is still a bit bulky on the left side compared to the right but this could be thinned in the future.  The rib donor site is healing with only a 1 cm area of opening and no signs of any drainage purulence or erythema.  She feels like she has had a stable point and we will plan to let things settle for around 2 months and then see her back to discuss any further revision surgeries regarding the flap.  She knows to call if any questions arise in the meantime.  All of her questions were answered. ?

## 2021-06-08 ENCOUNTER — Other Ambulatory Visit: Payer: Self-pay

## 2021-06-09 ENCOUNTER — Other Ambulatory Visit: Payer: Self-pay

## 2021-06-12 ENCOUNTER — Encounter: Payer: Self-pay | Admitting: Family Medicine

## 2021-06-12 ENCOUNTER — Ambulatory Visit: Payer: No Typology Code available for payment source | Attending: Family Medicine | Admitting: Family Medicine

## 2021-06-12 ENCOUNTER — Other Ambulatory Visit: Payer: Self-pay

## 2021-06-12 VITALS — BP 135/73 | HR 80 | Ht 67.0 in | Wt 93.0 lb

## 2021-06-12 DIAGNOSIS — R636 Underweight: Secondary | ICD-10-CM

## 2021-06-12 DIAGNOSIS — I10 Essential (primary) hypertension: Secondary | ICD-10-CM

## 2021-06-12 DIAGNOSIS — D099 Carcinoma in situ, unspecified: Secondary | ICD-10-CM

## 2021-06-12 DIAGNOSIS — Z1211 Encounter for screening for malignant neoplasm of colon: Secondary | ICD-10-CM

## 2021-06-12 DIAGNOSIS — F17200 Nicotine dependence, unspecified, uncomplicated: Secondary | ICD-10-CM

## 2021-06-12 DIAGNOSIS — G4709 Other insomnia: Secondary | ICD-10-CM

## 2021-06-12 MED ORDER — NICOTINE 7 MG/24HR TD PT24
7.0000 mg | MEDICATED_PATCH | Freq: Every day | TRANSDERMAL | 1 refills | Status: DC
  Filled 2021-06-12: qty 28, 28d supply, fill #0
  Filled 2021-08-05: qty 28, 28d supply, fill #1

## 2021-06-12 MED ORDER — HYDROXYZINE HCL 25 MG PO TABS
25.0000 mg | ORAL_TABLET | Freq: Every evening | ORAL | 3 refills | Status: DC | PRN
  Filled 2021-06-12: qty 30, 30d supply, fill #0

## 2021-06-12 NOTE — Patient Instructions (Signed)

## 2021-06-12 NOTE — Progress Notes (Signed)
? ?Subjective:  ?Patient ID: Melanie Harris, female    DOB: [birth year]  Age: 61 y.o. MRN: [id number] ? ?CC: Hypertension ? ? ?HPI ?Melanie Harris is a 61 y.o. 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 ?Interval History: ?She has been followed closely by plastic surgery and will be scheduled for elective surgery in July/2023 to shave off the left ala of her nose. ?She smokes 3-4 Cigarettes /day but has not smoked up to 20 pack years.  Has been on Wellbutrin. ? ?She would like a medication as needed insomnia.  Her plastic surgeon had prescribed Klonopin which she was using as needed. ?Endorses adherence with her antihypertensive and her statin. ?Denies additional concerns today. ?She remains underweight but informs me that her appetite is at her baseline right now but last year when she had the surgery she did have reduced appetite.  Her weight over the last 6 years has hovered around 93 to 100 pounds. ?Past Medical History:  ?Diagnosis Date  ? Abscess of right thigh 07/25/2011  ? Abscess of thigh   ? right  ? Anxiety state 12/21/2015  ? Cough 08/12/2013  ? Hypertension   ? Low grade squamous intraepithelial lesion (LGSIL) on cervical Pap smear 07/03/2016  ? '[ ]'$  rpt cytology and HPV testing 06/2017  2002: TAH for benign indications. Pathology negative 06/2016: LSIL/no hpv testing done  ? Other emphysema ([place]) 08/12/2013  ? Pure hypercholesterolemia 12/22/2015  ? Rash 08/21/2011  ? Smoking 08/12/2013  ? Xerosis of skin 02/21/2017  ? ? ?Past Surgical History:  ?Procedure Laterality Date  ? ABDOMINAL HYSTERECTOMY  2004  ? NASAL FLAP ROTATION Left 01/27/2021  ? Procedure: NASAL FLAP ROTATION;  Surgeon: [name], MD;  Location: [place];  Service: Plastics;  Laterality: Left;  ? NASAL RECONSTRUCTION Left  01/27/2021  ? Procedure: NASAL RECONSTRUCTION;  Surgeon: [name], MD;  Location: [place];  Service: Plastics;  Laterality: Left;  ? NASAL RECONSTRUCTION Bilateral 02/21/2021  ? Procedure: DIVISION AND INSET OF NASAL FLAPS, DEBRIDEMENT RIGHT NASOLABIAL FLAP, REVISION OF INSET RIGHT NASOLABIAL FLAP;  Surgeon: [name], MD;  Location: [place];  Service: Plastics;  Laterality: Bilateral;  ? NASAL RECONSTRUCTION Right 03/13/2021  ? Procedure: Division and Inset forehead flap, with nasal reconstruction with flaps and grafts as necessary, left rib cartilage graft.;  Surgeon: [name], MD;  Location: [place];  Service: Plastics;  Laterality: Right;  1.5 hour  ? RHINOPLASTY Left 01/18/2021  ? Procedure: PARTIAL RHINECTOMY WITH FROZEN SECTION;  Surgeon: [name], MD;  Location: [place];  Service: ENT;  Laterality: Left;  ? SKIN FULL THICKNESS GRAFT Left 01/27/2021  ? Procedure: SKIN GRAFT FULL THICKNESS;  Surgeon: [name], MD;  Location: [place];  Service: Plastics;  Laterality: Left;  ? TONSILLECTOMY  10-11 yrs old  ? ? ?Family History  ?Problem Relation Age of Onset  ? Heart failure Mother   ?     started in her 45's  ? Hypertension Mother   ? Hyperlipidemia Mother   ? Lung cancer Father   ? Hypertension Father   ? Hypercholesterolemia Father   ? Diabetes Sister   ?     diet controlled  ? Dysrhythmia Sister   ?     possible in the past  ? Healthy Daughter   ? ? ?  Social History  ? ?Socioeconomic History  ? Marital status: Divorced  ?  Spouse name: Not on file  ? Number of children: 1  ? Years of education: 1  ? Highest education level: High school graduate  ?Occupational History  ? Not on file  ?Tobacco Use  ? Smoking status: Former  ?  Packs/day: 0.25  ?  Years: 30.00  ?  Pack years: 7.50  ?  Types: Cigarettes  ?  Quit date: 02/07/2021  ?  Years since quitting: 0.3  ? Smokeless tobacco: Never  ? Tobacco comments:  ?  1-3 cigs daily  ?Vaping Use  ? Vaping Use: Never used  ?Substance and Sexual Activity  ?  Alcohol use: Yes  ?  Alcohol/week: 1.0 standard drink  ?  Types: 1 Glasses of wine per week  ?  Comment: occassional  ? Drug use: No  ? Sexual activity: Not Currently  ?Other Topics Concern  ? Not on file  ?Social History Narrative  ? Not on file  ? ?Social Determinants of Health  ? ?Financial Resource Strain: Not on file  ?Food Insecurity: Not on file  ?Transportation Needs: Not on file  ?Physical Activity: Not on file  ?Stress: Not on file  ?Social Connections: Not on file  ? ? ?Allergies  ?Allergen Reactions  ? Morphine And Related Nausea And Vomiting  ? Doxycycline Rash  ? ? ?Outpatient Medications Prior to Visit  ?Medication Sig Dispense Refill  ? acetaminophen (TYLENOL) 500 MG tablet Take 1,000 mg by mouth every 6 (six) hours as needed for moderate pain (pain).    ? amLODipine (NORVASC) 10 MG tablet TAKE 1 TABLET (10 MG TOTAL) BY MOUTH DAILY. 30 tablet 6  ? atorvastatin (LIPITOR) 20 MG tablet TAKE 1 TABLET (20 MG TOTAL) BY MOUTH DAILY. 30 tablet 6  ? buPROPion (WELLBUTRIN XL) 150 MG 24 hr tablet Take 1 tablet (150 mg total) by mouth daily. For smoking cessation 30 tablet 3  ? cholecalciferol (VITAMIN D) 25 MCG (1000 UNIT) tablet Take 1,000 Units by mouth every morning.    ? clonazePAM (KLONOPIN) 0.5 MG tablet Take 1 tablet (0.5 mg total) by mouth at bedtime as needed for anxiety. 20 tablet 1  ? metoprolol succinate (TOPROL-XL) 100 MG 24 hr tablet TAKE 1 TABLET (100 MG TOTAL) BY MOUTH DAILY. 30 tablet 6  ? ?No facility-administered medications prior to visit.  ? ? ? ?ROS ?Review of Systems  ?Constitutional:  Negative for activity change, appetite change and fatigue.  ?HENT:  Negative for congestion, sinus pressure and sore throat.   ?Eyes:  Negative for visual disturbance.  ?Respiratory:  Negative for cough, chest tightness, shortness of breath and wheezing.   ?Cardiovascular:  Negative for chest pain and palpitations.  ?Gastrointestinal:  Negative for abdominal distention, abdominal pain and constipation.   ?Endocrine: Negative for polydipsia.  ?Genitourinary:  Negative for dysuria and frequency.  ?Musculoskeletal:  Negative for arthralgias and back pain.  ?Skin:  Negative for rash.  ?Neurological:  Negative for tremors, light-headedness and numbness.  ?Hematological:  Does not bruise/bleed easily.  ?Psychiatric/Behavioral:  Negative for agitation and behavioral problems.   ? ?Objective:  ?BP 135/73   Pulse 80   Ht '5\' 7"'$  (1.702 m)   Wt 93 lb (42.2 kg)   SpO2 99%   BMI 14.57 kg/m?  ? ? ?  06/12/2021  ?  4:18 PM 05/18/2021  ?  2:36 PM 05/10/2021  ?  1:07 PM  ?BP/Weight  ?Systolic BP 664 403  270  ?Diastolic BP 73 87 77  ?Wt. (Lbs) 93    ?BMI 14.57 kg/m2    ? ? ? ? ?Physical Exam ?Constitutional:   ?   Appearance: She is well-developed.  ?HENT:  ?   Nose:  ?   Comments: L nostril with surgical scar, ala larger than right ?Cardiovascular:  ?   Rate and Rhythm: Normal rate.  ?   Heart sounds: Normal heart sounds. No murmur heard. ?Pulmonary:  ?   Effort: Pulmonary effort is normal.  ?   Breath sounds: Normal breath sounds. No wheezing or rales.  ?Chest:  ?   Chest wall: No tenderness.  ?Abdominal:  ?   General: Bowel sounds are normal. There is no distension.  ?   Palpations: Abdomen is soft. There is no mass.  ?   Tenderness: There is no abdominal tenderness.  ?Musculoskeletal:     ?   General: Normal range of motion.  ?   Right lower leg: No edema.  ?   Left lower leg: No edema.  ?Neurological:  ?   Mental Status: She is alert and oriented to person, place, and time.  ?Psychiatric:     ?   Mood and Affect: Mood normal.  ? ? ? ?  Latest Ref Rng & Units 02/21/2021  ?  8:30 AM 01/16/2021  ?  2:59 PM 12/12/2020  ?  4:33 PM  ?CMP  ?Glucose 70 - 99 mg/dL 93   89   103    ?BUN 6 - 20 mg/dL '13   17   9    '$ ?Creatinine 0.44 - 1.00 mg/dL 0.67   0.67   0.62    ?Sodium 135 - 145 mmol/L 137   139   142    ?Potassium 3.5 - 5.1 mmol/L 3.9   3.7   4.2    ?Chloride 98 - 111 mmol/L 104   99   101    ?CO2 22 - 32 mmol/L '21   27   24     '$ ?Calcium 8.9 - 10.3 mg/dL 9.6   10.1   9.6    ?Total Protein 6.0 - 8.5 g/dL   6.9    ?Total Bilirubin 0.0 - 1.2 mg/dL   0.2    ?Alkaline Phos 44 - 121 IU/L   107    ?AST 0 - 40 IU/L   23    ?ALT 0 - 32 IU/L   25    ? ? ?Lipid

## 2021-06-14 ENCOUNTER — Other Ambulatory Visit: Payer: Self-pay

## 2021-07-05 ENCOUNTER — Telehealth: Payer: Self-pay | Admitting: Physician Assistant

## 2021-07-06 ENCOUNTER — Other Ambulatory Visit: Payer: Self-pay

## 2021-07-10 ENCOUNTER — Other Ambulatory Visit: Payer: Self-pay

## 2021-07-10 ENCOUNTER — Other Ambulatory Visit: Payer: Self-pay | Admitting: Physician Assistant

## 2021-07-10 MED ORDER — MAGNESIUM OXIDE 400 MG PO TABS
ORAL_TABLET | ORAL | 3 refills | Status: DC
  Filled 2021-07-10: qty 90, 90d supply, fill #0
  Filled 2021-10-04: qty 90, 90d supply, fill #1
  Filled 2022-01-31: qty 30, 30d supply, fill #2
  Filled 2022-03-03: qty 30, 30d supply, fill #3
  Filled 2022-04-02: qty 30, 30d supply, fill #4

## 2021-07-10 NOTE — Telephone Encounter (Signed)
Last fill was 06/2020 does patient still need to take this medication.(magnesium oxide (MAG-OX) 400 MG tablet )

## 2021-07-11 NOTE — Telephone Encounter (Signed)
I have sent a refill to her pharmacy.

## 2021-07-25 ENCOUNTER — Other Ambulatory Visit (HOSPITAL_COMMUNITY)
Admission: RE | Admit: 2021-07-25 | Discharge: 2021-07-25 | Disposition: A | Discharge status: Home / Self Care | Payer: Self-pay | Source: Ambulatory Visit | Attending: Family Medicine | Admitting: Family Medicine

## 2021-07-25 ENCOUNTER — Encounter: Payer: Self-pay | Admitting: Family Medicine

## 2021-07-25 ENCOUNTER — Ambulatory Visit: Payer: No Typology Code available for payment source | Attending: Family Medicine | Admitting: Family Medicine

## 2021-07-25 VITALS — BP 125/68 | HR 79 | Temp 97.8°F | Ht 67.0 in | Wt 97.2 lb

## 2021-07-25 DIAGNOSIS — Z0000 Encounter for general adult medical examination without abnormal findings: Secondary | ICD-10-CM

## 2021-07-25 DIAGNOSIS — Z1231 Encounter for screening mammogram for malignant neoplasm of breast: Secondary | ICD-10-CM

## 2021-07-25 DIAGNOSIS — N893 Dysplasia of vagina, unspecified: Secondary | ICD-10-CM | POA: Insufficient documentation

## 2021-07-25 DIAGNOSIS — Z1211 Encounter for screening for malignant neoplasm of colon: Secondary | ICD-10-CM

## 2021-07-25 NOTE — Patient Instructions (Signed)

## 2021-07-25 NOTE — Progress Notes (Unsigned)
Subjective:  Patient ID: Melanie Harris, female    DOB: 13-Nov-1960  Age: 61 y.o. MRN: [id number]  CC: Gynecologic Exam   HPI Melanie Harris is a 61 y.o. 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 She presents today for complete physical exam.  Interval History: She is due for mammogram, colorectal cancer screening. 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  Her diet consists of adequate intake of fruits and vegetables.  She exercises regularly by means of walking Past Medical History:  Diagnosis Date   Abscess of right thigh 07/25/2011   Abscess of thigh    right   Anxiety state 12/21/2015   Cough 08/12/2013   Hypertension    Low grade 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 Pathology negative 06/2016: LSIL/no hpv testing done   Other emphysema ([place]) 08/12/2013   Pure hypercholesterolemia 12/22/2015   Rash 08/21/2011   Smoking 08/12/2013   Xerosis of skin 02/21/2017    Past Surgical History:  Procedure Laterality Date   ABDOMINAL HYSTERECTOMY  2004   NASAL FLAP ROTATION Left 01/27/2021   Procedure: NASAL FLAP ROTATION;  Surgeon: [name], MD;  Location: [facility];  Service: Plastics;  Laterality: Left;   NASAL RECONSTRUCTION Left 01/27/2021   Procedure: NASAL RECONSTRUCTION;  Surgeon: [name], MD;  Location: [facility];  Service: Plastics;  Laterality: Left;   NASAL RECONSTRUCTION Bilateral 02/21/2021   Procedure: DIVISION AND INSET OF NASAL FLAPS, DEBRIDEMENT RIGHT NASOLABIAL FLAP, REVISION OF INSET RIGHT NASOLABIAL FLAP;  Surgeon: [name], MD;  Location: [facility];  Service: Plastics;  Laterality: Bilateral;   NASAL  RECONSTRUCTION Right 03/13/2021   Procedure: Division and Inset forehead flap, with nasal reconstruction with flaps and grafts as necessary, left rib cartilage graft.;  Surgeon: [name], MD;  Location: [facility];  Service: Plastics;  Laterality: Right;  1.5 hour   RHINOPLASTY Left 01/18/2021   Procedure: PARTIAL RHINECTOMY WITH FROZEN SECTION;  Surgeon: [name], MD;  Location: [facility];  Service: ENT;  Laterality: Left;   SKIN FULL THICKNESS GRAFT Left 01/27/2021   Procedure: SKIN GRAFT FULL THICKNESS;  Surgeon: [name], MD;  Location: [facility];  Service: Plastics;  Laterality: Left;   TONSILLECTOMY  10-11 yrs old    Family History  Problem Relation Age of Onset   Heart failure Mother        started in her 15's   Hypertension Mother    Hyperlipidemia Mother    Lung cancer Father    Hypertension Father    Hypercholesterolemia Father    Diabetes Sister        diet controlled   Dysrhythmia Sister        possible in the past   Healthy Daughter     Social History   Socioeconomic History   Marital status: Divorced    Spouse name: Not on file   Number of children: 1   Years of education: 1   Highest education level: High school graduate  Occupational History   Not on file  Tobacco Use   Smoking status: Former    Packs/day: 0.25    Years: 30.00    Total pack years: 7.50  Types: Cigarettes    Quit date: 02/07/2021    Years since quitting: 0.4   Smokeless tobacco: Never   Tobacco comments:    1-3 cigs daily  Vaping Use   Vaping Use: Never used  Substance and Sexual Activity   Alcohol use: Yes    Alcohol/week: 1.0 standard drink of alcohol    Types: 1 Glasses of wine per week    Comment: occassional   Drug use: No   Sexual activity: Not Currently  Other Topics Concern   Not on file  Social History Narrative   Not on file   Social Determinants of Health   Financial Resource Strain: Not on file  Food Insecurity: Not on file  Transportation Needs: No  Transportation Needs (10/01/2019)   PRAPARE - Transportation    Lack of Transportation (Medical): No    Lack of Transportation (Non-Medical): No  Physical Activity: Not on file  Stress: Not on file  Social Connections: Not on file    Allergies  Allergen Reactions   Morphine And Related Nausea And Vomiting   Doxycycline Rash    Outpatient Medications Prior to Visit  Medication Sig Dispense Refill   acetaminophen (TYLENOL) 500 MG tablet Take 1,000 mg by mouth every 6 (six) hours as needed for moderate pain (pain).     amLODipine (NORVASC) 10 MG tablet TAKE 1 TABLET (10 MG TOTAL) BY MOUTH DAILY. 30 tablet 6   atorvastatin (LIPITOR) 20 MG tablet TAKE 1 TABLET (20 MG TOTAL) BY MOUTH DAILY. 30 tablet 6   cholecalciferol (VITAMIN D) 25 MCG (1000 UNIT) tablet Take 1,000 Units by mouth every morning.     hydrOXYzine (ATARAX) 25 MG tablet Take 1 tablet (25 mg total) by mouth at bedtime as needed. 30 tablet 3   magnesium oxide (MAG-OX) 400 MG tablet TAKE 1 TABLET (400 MG TOTAL) BY MOUTH DAILY. 90 tablet 3   metoprolol succinate (TOPROL-XL) 100 MG 24 hr tablet TAKE 1 TABLET (100 MG TOTAL) BY MOUTH DAILY. 30 tablet 6   nicotine (NICODERM CQ) 7 mg/24hr patch Place 1 patch (7 mg total) onto the skin daily. 28 patch 1   buPROPion (WELLBUTRIN XL) 150 MG 24 hr tablet Take 1 tablet (150 mg total) by mouth daily. For smoking cessation (Patient not taking: Reported on 07/25/2021) 30 tablet 3   clonazePAM (KLONOPIN) 0.5 MG tablet Take 1 tablet (0.5 mg total) by mouth at bedtime as needed for anxiety. (Patient not taking: Reported on 07/25/2021) 20 tablet 1   No facility-administered medications prior to visit.     ROS Review of Systems  Constitutional:  Negative for activity change, appetite change and fatigue.  HENT:  Negative for congestion, sinus pressure and sore throat.   Eyes:  Negative for visual disturbance.  Respiratory:  Negative for cough, chest tightness, shortness of breath and wheezing.    Cardiovascular:  Negative for chest pain and palpitations.  Gastrointestinal:  Negative for abdominal distention, abdominal pain and constipation.  Endocrine: Negative for polydipsia.  Genitourinary:  Negative for dysuria and frequency.  Musculoskeletal:  Negative for arthralgias and back pain.  Skin:  Negative for rash.  Neurological:  Negative for tremors, light-headedness and numbness.  Hematological:  Does not bruise/bleed easily.  Psychiatric/Behavioral:  Negative for agitation and behavioral problems.     Objective:  BP 125/68   Pulse 79   Temp 97.8 F (36.6 C) (Oral)   Ht '5\' 7"'$  (1.702 m)   Wt 97 lb 3.2 oz (44.1 kg)   SpO2  98%   BMI 15.22 kg/m      07/25/2021    2:04 PM 06/12/2021    4:18 PM 05/18/2021    2:36 PM  BP/Weight  Systolic BP 353 299 242  Diastolic BP 68 73 87  Wt. (Lbs) 97.2 93   BMI 15.22 kg/m2 14.57 kg/m2       Physical Exam Exam conducted with a chaperone present.  Constitutional:      General: She is not in acute distress.    Appearance: She is well-developed. She is not diaphoretic.     Comments: Underweight  HENT:     Head: Normocephalic.     Right Ear: External ear normal.     Left Ear: External ear normal.     Nose:     Comments: Nostril with surgical scar Eyes:     Conjunctiva/sclera: Conjunctivae normal.     Pupils: Pupils are equal, round, and reactive to light.  Neck:     Vascular: No JVD.  Cardiovascular:     Rate and Rhythm: Normal rate and regular rhythm.     Heart sounds: Normal heart sounds. No murmur heard.    No gallop.  Pulmonary:     Effort: Pulmonary effort is normal. No respiratory distress.     Breath sounds: Normal breath sounds. No wheezing or rales.  Chest:     Chest wall: No tenderness.  Breasts:    Right: Normal. No mass, nipple discharge or tenderness.     Left: Normal. No mass, nipple discharge or tenderness.  Abdominal:     General: Bowel sounds are normal. There is no distension.     Palpations:  Abdomen is soft. There is no mass.     Tenderness: There is no abdominal tenderness.     Hernia: There is no hernia in the left inguinal area or right inguinal area.  Genitourinary:    General: Normal vulva.     Pubic Area: No rash.      Labia:        Right: No rash.        Left: No rash.      Vagina: Normal.     Uterus: Absent.      Adnexa: Right adnexa normal and left adnexa normal.       Right: No tenderness.         Left: No tenderness.    Musculoskeletal:        General: No tenderness. Normal range of motion.     Cervical back: Normal range of motion. No tenderness.  Lymphadenopathy:     Upper Body:     Right upper body: No supraclavicular or axillary adenopathy.     Left upper body: No supraclavicular or axillary adenopathy.  Skin:    General: Skin is warm and dry.  Neurological:     Mental Status: She is alert and oriented to person, place, and time.     Deep Tendon Reflexes: Reflexes are normal and symmetric.        Latest Ref Rng & Units 02/21/2021    8:30 AM 01/16/2021    2:59 PM 12/12/2020    4:33 PM  CMP  Glucose 70 - 99 mg/dL 93  89  103   BUN 6 - 20 mg/dL '13  17  9   '$ Creatinine 0.44 - 1.00 mg/dL 0.67  0.67  0.62   Sodium 135 - 145 mmol/L 137  139  142   Potassium 3.5 - 5.1 mmol/L 3.9  3.7  4.2   Chloride 98 - 111 mmol/L 104  99  101   CO2 22 - 32 mmol/L '21  27  24   '$ Calcium 8.9 - 10.3 mg/dL 9.6  10.1  9.6   Total Protein 6.0 - 8.5 g/dL   6.9   Total Bilirubin 0.0 - 1.2 mg/dL   0.2   Alkaline Phos 44 - 121 IU/L   107   AST 0 - 40 IU/L   23   ALT 0 - 32 IU/L   25     Lipid Panel     Component Value Date/Time   CHOL 177 06/09/2020 1625   TRIG 101 06/09/2020 1625   HDL 98 06/09/2020 1625   CHOLHDL 1.8 06/09/2020 1625   CHOLHDL 1.7 12/21/2015 1230   VLDL 14 12/21/2015 1230   LDLCALC 61 06/09/2020 1625    CBC    Component Value Date/Time   WBC 10.5 02/21/2021 0830   RBC 4.22 02/21/2021 0830   HGB 13.9 02/21/2021 0830   HGB 12.5 06/09/2020 1625    HCT 41.4 02/21/2021 0830   HCT 37.1 06/09/2020 1625   PLT 255 02/21/2021 0830   PLT 256 06/09/2020 1625   MCV 98.1 02/21/2021 0830   MCV 97 06/09/2020 1625   MCH 32.9 02/21/2021 0830   MCHC 33.6 02/21/2021 0830   RDW 13.0 02/21/2021 0830   RDW 12.3 06/09/2020 1625   LYMPHSABS 2.3 06/09/2020 1625   MONOABS 0.7 10/30/2016 1222   EOSABS 0.1 06/09/2020 1625   BASOSABS 0.1 06/09/2020 1625    Lab Results  Component Value Date   HGBA1C 5.3 04/20/2013    Assessment & Plan:  1. Annual physical exam Counseled on 150 minutes of exercise per week, healthy eating (including decreased daily intake of saturated fats, cholesterol, added sugars, sodium), STI prevention, routine healthcare maintenance.   2. Encounter for screening mammogram for malignant neoplasm of breast - MM 3D SCREEN BREAST BILATERAL; Future  3. Screening for colon cancer - Fecal occult blood, imunochemical  4. 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   Follow-up: Return for follow up, keep previously scheduled appointment.       Charlott Rakes, MD, FAAFP. [facility] and [facility], [place]   07/25/2021, 2:17 PM

## 2021-07-26 ENCOUNTER — Encounter: Payer: Self-pay | Admitting: Family Medicine

## 2021-07-27 LAB — CYTOLOGY - PAP
Comment: NEGATIVE
Comment: NEGATIVE
Comment: NEGATIVE
HPV 16: NEGATIVE
HPV 18 / 45: NEGATIVE
High risk HPV: POSITIVE — AB

## 2021-07-28 ENCOUNTER — Other Ambulatory Visit: Payer: Self-pay | Admitting: Family Medicine

## 2021-07-28 DIAGNOSIS — N893 Dysplasia of vagina, unspecified: Secondary | ICD-10-CM

## 2021-07-31 ENCOUNTER — Other Ambulatory Visit: Payer: Self-pay

## 2021-07-31 DIAGNOSIS — Z1231 Encounter for screening mammogram for malignant neoplasm of breast: Secondary | ICD-10-CM

## 2021-08-01 ENCOUNTER — Telehealth: Payer: Self-pay | Admitting: *Deleted

## 2021-08-01 NOTE — Telephone Encounter (Signed)
Faxed on (05/19/21) order to [facility] for supplies for the patient.  Confirmation received and copy scanned into the chart.//AB/CMA

## 2021-08-02 ENCOUNTER — Ambulatory Visit (INDEPENDENT_AMBULATORY_CARE_PROVIDER_SITE_OTHER): Payer: No Typology Code available for payment source | Admitting: Plastic Surgery

## 2021-08-02 DIAGNOSIS — D049 Carcinoma in situ of skin, unspecified: Secondary | ICD-10-CM

## 2021-08-02 NOTE — Progress Notes (Signed)
Patient presents several months out from nasal reconstruction.  She feels like things are going well.  She is all healed up.  She did have an infection after the final division and inset procedure which I believe resulted in loss of the dorsal cartilage graft.  Today she demonstrates a bit of a step-off dorsally.  The flap is still a bit bulky.  Her forehead is healed nicely.  I do think she would benefit from debulking of the forehead flap itself she is in agreement with that.  Given all the problems we had after the cartilage graft for dorsal support her and I both are in favor of not attempting an additional dorsal cartilage graft.  We will plan to set her up for thinning of the forehead flap and I think this will give her a reasonable result.  All of her questions were answered.

## 2021-08-07 ENCOUNTER — Other Ambulatory Visit: Payer: Self-pay

## 2021-08-09 ENCOUNTER — Other Ambulatory Visit: Payer: Self-pay

## 2021-08-12 ENCOUNTER — Telehealth: Payer: Self-pay | Admitting: Plastic Surgery

## 2021-08-12 NOTE — Telephone Encounter (Signed)
Patient made aware of the office change.  She would like to see Dr. Erin Hearing 7/31 from 9:00 - 9:45.  Note sent to office to make the change.

## 2021-08-17 ENCOUNTER — Ambulatory Visit: Payer: No Typology Code available for payment source | Admitting: Plastic Surgery

## 2021-09-02 ENCOUNTER — Other Ambulatory Visit: Payer: Self-pay | Admitting: Family Medicine

## 2021-09-02 DIAGNOSIS — E7800 Pure hypercholesterolemia, unspecified: Secondary | ICD-10-CM

## 2021-09-02 DIAGNOSIS — I10 Essential (primary) hypertension: Secondary | ICD-10-CM

## 2021-09-04 ENCOUNTER — Other Ambulatory Visit: Payer: Self-pay

## 2021-09-04 ENCOUNTER — Other Ambulatory Visit (HOSPITAL_COMMUNITY): Payer: Self-pay

## 2021-09-04 ENCOUNTER — Ambulatory Visit (INDEPENDENT_AMBULATORY_CARE_PROVIDER_SITE_OTHER): Payer: Self-pay | Admitting: Plastic Surgery

## 2021-09-04 VITALS — BP 149/76 | HR 84

## 2021-09-04 DIAGNOSIS — D099 Carcinoma in situ, unspecified: Secondary | ICD-10-CM

## 2021-09-04 MED ORDER — AMLODIPINE BESYLATE 10 MG PO TABS
ORAL_TABLET | Freq: Every day | ORAL | 0 refills | Status: DC
  Filled 2021-09-04: qty 30, 30d supply, fill #0
  Filled 2021-09-04: qty 90, 90d supply, fill #0
  Filled 2021-10-04: qty 30, 30d supply, fill #1

## 2021-09-04 MED ORDER — METOPROLOL SUCCINATE ER 100 MG PO TB24
ORAL_TABLET | Freq: Every day | ORAL | 0 refills | Status: DC
  Filled 2021-09-04: qty 30, 30d supply, fill #0
  Filled 2021-09-04: qty 90, 90d supply, fill #0
  Filled 2021-10-04: qty 30, 30d supply, fill #1
  Filled 2021-11-02: qty 30, 30d supply, fill #2

## 2021-09-04 MED ORDER — ATORVASTATIN CALCIUM 20 MG PO TABS
ORAL_TABLET | Freq: Every day | ORAL | 0 refills | Status: DC
  Filled 2021-09-04: qty 90, 90d supply, fill #0
  Filled 2021-09-04: qty 30, 30d supply, fill #0
  Filled 2021-10-04: qty 30, 30d supply, fill #1

## 2021-09-04 NOTE — Progress Notes (Signed)
Patient present for debulking of left nose forehead flap and bilateral nasolabial flap after partial rhinectomy for squamous cell cancer.  PE Typical thickness left, forehead flap, some hair growth  A/P Patient was hesitant today to thin flap under local and would like to do this in the operating room.  This is reasonable and we will get her scheduled as we can.

## 2021-09-04 NOTE — Telephone Encounter (Signed)
Requested Prescriptions  Pending Prescriptions Disp Refills  . metoprolol succinate (TOPROL-XL) 100 MG 24 hr tablet 90 tablet 0    Sig: TAKE 1 TABLET (100 MG TOTAL) BY MOUTH DAILY.     Cardiovascular:  Beta Blockers Passed - 09/02/2021  3:31 PM      Passed - Last BP in normal range    BP Readings from Last 1 Encounters:  09/04/21 (!) 149/76         Passed - Last Heart Rate in normal range    Pulse Readings from Last 1 Encounters:  09/04/21 84         Passed - Valid encounter within last 6 months    Recent Outpatient Visits          1 month ago Annual physical exam   [place], Charlane Ferretti, MD   2 months ago Essential hypertension   [place], Enobong, MD   8 months ago Left nasal polyps   [place], Enobong, MD   9 months ago Sore in [place], [place]   1 year ago Sciatica of right side   [place], [place]      Future Appointments            In 3 months Charlott Rakes, MD [place]           . atorvastatin (LIPITOR) 20 MG tablet 90 tablet 0    Sig: TAKE 1 TABLET (20 MG TOTAL) BY MOUTH DAILY.     Cardiovascular:  Antilipid - Statins Failed - 09/02/2021  3:31 PM      Failed - Lipid Panel in normal range within the last 12 months    Cholesterol, Total  Date Value Ref Range Status  06/09/2020 177 100 - 199 mg/dL Final   LDL Chol Calc (NIH)  Date Value Ref Range Status  06/09/2020 61 0 - 99 mg/dL Final   HDL  Date Value Ref Range Status  06/09/2020 98 >39 mg/dL Final   Triglycerides  Date Value Ref Range Status  06/09/2020 101 0 - 149 mg/dL Final         Passed - Patient is not pregnant      Passed - Valid encounter within last 12 months    Recent Outpatient Visits          1 month ago Annual physical exam    [place], Charlane Ferretti, MD   2 months ago Essential hypertension   [place], Charlane Ferretti, MD   8 months ago Left nasal polyps   [place], Enobong, MD   9 months ago Sore in [place], [place]   1 year ago Sciatica of right side   [place], [place]      Future Appointments            In 3 months Charlott Rakes, MD [place]           . amLODipine (NORVASC) 10 MG tablet 90 tablet 0    Sig: TAKE 1 TABLET (10 MG TOTAL) BY MOUTH DAILY.     Cardiovascular: Calcium Channel Blockers 2  Passed - 09/02/2021  3:31 PM      Passed - Last BP in normal range    BP Readings from Last 1 Encounters:  09/04/21 (!) 149/76         Passed - Last Heart Rate in normal range    Pulse Readings from Last 1 Encounters:  09/04/21 84         Passed - Valid encounter within last 6 months    Recent Outpatient Visits          1 month ago Annual physical exam   [place], Charlane Ferretti, MD   2 months ago Essential hypertension   [place], Enobong, MD   8 months ago Left nasal polyps   [place], Enobong, MD   9 months ago Sore in [place], [place]   1 year ago Sciatica of right side   [place], [place]      Future Appointments            In 3 months Charlott Rakes, MD [place]

## 2021-09-08 ENCOUNTER — Telehealth: Payer: Self-pay | Admitting: *Deleted

## 2021-09-08 NOTE — Telephone Encounter (Signed)
Spoke to patient about surgery dates and confirmed.

## 2021-09-19 ENCOUNTER — Ambulatory Visit
Admission: RE | Admit: 2021-09-19 | Discharge: 2021-09-19 | Disposition: A | Discharge status: Home / Self Care | Payer: No Typology Code available for payment source | Source: Ambulatory Visit | Attending: Family Medicine | Admitting: Family Medicine

## 2021-09-19 ENCOUNTER — Ambulatory Visit: Payer: Self-pay | Admitting: *Deleted

## 2021-09-19 VITALS — BP 128/68 | Wt 97.9 lb

## 2021-09-19 DIAGNOSIS — Z1211 Encounter for screening for malignant neoplasm of colon: Secondary | ICD-10-CM

## 2021-09-19 DIAGNOSIS — R87612 Low grade squamous intraepithelial lesion on cytologic smear of cervix (LGSIL): Secondary | ICD-10-CM

## 2021-09-19 DIAGNOSIS — R87820 Cervical low risk human papillomavirus (HPV) DNA test positive: Secondary | ICD-10-CM

## 2021-09-19 DIAGNOSIS — Z1239 Encounter for other screening for malignant neoplasm of breast: Secondary | ICD-10-CM

## 2021-09-19 DIAGNOSIS — Z1231 Encounter for screening mammogram for malignant neoplasm of breast: Secondary | ICD-10-CM

## 2021-09-19 NOTE — Progress Notes (Signed)
Ms. Kailena Lubas is a 61 y.o. female who presents to [facility] clinic today with no complaints.    Pap Smear: Pap smear not completed today. Last Pap smear was  07/25/2021 at [facility] and was abnormal LSIL with positive HPV that was negative for HPV 16/18/45. Patient has history of two other abnormal vaginal Pap smears 02/20/2018 that was LSIL that patient was referred for a colposcopy 04/24/2018. The colposcopy was not completed and a Pap smear with co-testing recommended in one year. Patient has history of one other abnormal Pap smear 06/22/2016 that was LSIL. Pap smear and co-testing recommended in one year. Per patient has no history of an abnormal Pap smear prior to the last two Pap smears. Patient has a history of a hysterectomy 11/28/2000 due to fibroids. Last two Pap smears and hysterectomy results are in Epic.   Physical exam: Breasts Breasts symmetrical. No skin abnormalities bilateral breasts. No nipple retraction bilateral breasts. No nipple discharge bilateral breasts. No lymphadenopathy. No lumps palpated bilateral breasts. No complaints of pain or tenderness on exam.     MS DIGITAL SCREENING TOMO BILATERAL  Result Date: 10/05/2019 CLINICAL DATA:  Screening. EXAM: DIGITAL SCREENING BILATERAL MAMMOGRAM WITH TOMO AND CAD COMPARISON:  Previous exam(s). ACR Breast Density Category d: The breast tissue is extremely dense, which lowers the sensitivity of mammography FINDINGS: There are no findings suspicious for malignancy. Images were processed with CAD. IMPRESSION: No mammographic evidence of malignancy. A result letter of this screening mammogram will be mailed directly to the patient. RECOMMENDATION: Screening mammogram in one year. (Code:SM-B-01Y) BI-RADS CATEGORY  1: Negative. Electronically Signed   By: Ammie Ferrier M.D.   On: 10/05/2019 12:04   MM Digital Diagnostic Unilat L  Result Date: 02/26/2018 CLINICAL DATA:  61 year old patient  recalled from recent screening mammogram for evaluation of possible left breast calcifications. EXAM: DIGITAL DIAGNOSTIC LEFT MAMMOGRAM WITH TOMO COMPARISON:  February 20, 2018 and earlier priors ACR Breast Density Category c: The breast tissue is heterogeneously dense, which may obscure small masses. FINDINGS: Magnification views performed of the left breast show no suspicious groupings or morphologies of microcalcifications. There is a chronic coarse calcification in the superficial superior left breast, unchanged from priors. There are a few scattered punctate calcifications. No findings to suggest malignancy. IMPRESSION: No evidence of malignancy in the left breast. RECOMMENDATION: Screening mammogram in one year.(Code:SM-B-01Y) I have discussed the findings and recommendations with the patient. Results were also provided in writing at the conclusion of the visit. If applicable, a reminder letter will be sent to the patient regarding the next appointment. BI-RADS CATEGORY  2: Benign. Electronically Signed   By: Curlene Dolphin M.D.   On: 02/26/2018 14:52   MS DIGITAL SCREENING TOMO BILATERAL  Result Date: 02/21/2018 CLINICAL DATA:  Screening. EXAM: DIGITAL SCREENING BILATERAL MAMMOGRAM WITH TOMO AND CAD COMPARISON:  Previous exam(s). ACR Breast Density Category c: The breast tissue is heterogeneously dense, which may obscure small masses. FINDINGS: In the left breast, calcifications warrant further evaluation. In the right breast, no findings suspicious for malignancy. Images were processed with CAD. IMPRESSION: Further evaluation is suggested for calcifications in the left breast. RECOMMENDATION: Diagnostic mammogram of the left breast. (Code:FI-L-27M) The patient will be contacted regarding the findings, and additional imaging will be scheduled. BI-RADS CATEGORY  0: Incomplete. Need additional imaging evaluation and/or prior mammograms for comparison. Electronically Signed   By: Nolon Nations M.D.   On:  02/21/2018 10:07  Pelvic/Bimanual Called GYN Oncology and left message with Maudie Mercury, CMA to have one of the providers to call to discuss recommendations for patients abnormal vaginal Pap smears.   Smoking History: Patient is a current smoker. Discussed smoking cessation with patient. Referred to the [facility] Quitline.   Patient Navigation: Patient education provided. Access to services provided for patient through [facility] program.   Colorectal Cancer Screening: Per patient has never had colonoscopy completed. Patient stated her PCP gave her a FIT Test to complete on 07/25/2021. Explained to patient the importance of completing. No complaints today.    Breast and Cervical Cancer Risk Assessment: Patient does not have family history of breast cancer, known genetic mutations, or radiation treatment to the chest before age 52. Patient does not have history of cervical dysplasia, immunocompromised, or DES exposure in-utero.   Risk Assessment     Risk Scores       09/19/2021 10/01/2019   Last edited by: Royston Bake, CMA [place]ill, Sherie Mamie Nick, LPN   5-year risk: 1.3 % 1.2 %   Lifetime risk: 6.6 % 6.9 %            A: [facility]CP exam without pap smear No complaints.  P: Referred patient to the [facility] for a screening mammogram on mobile unit. Appointment scheduled Tuesday, September 19, 2021 at 1300.  [name], RN 09/19/2021 1:42 PM

## 2021-09-19 NOTE — Patient Instructions (Signed)
Explained breast self awareness with Melanie Harris. Patient did not need a Pap smear today due to last Pap smear was 07/25/2021. Let her know will call her with follow up recommendations after speak with GYN Oncology. Referred patient to the [facility] for a screening mammogram on mobile unit. Appointment scheduled Tuesday, September 19, 2021 at 1300. Patient aware of appointment and will be there. Let patient know the [facility] will follow up with her within the next couple weeks with results of her mammogram by letter or phone. Melanie Harris verbalized understanding.  Breyer Tejera, Arvil Chaco, RN 1:43 PM

## 2021-09-21 ENCOUNTER — Telehealth: Payer: Self-pay

## 2021-09-21 NOTE — Telephone Encounter (Signed)
Spoke with patient about colpo recommendations. Per Dr. Berline Lopes with Gyn Oncology a vaginal colpo was recommended for FU. Patient is scheduled with [facility] on 11/09/21. Patient voiced understanding.

## 2021-09-24 NOTE — Progress Notes (Unsigned)
Cardiology Office Note Date:  09/24/2021  Patient ID:  Melanie, Harris 07/11/1960, MRN [id number] PCP:  Charlott Rakes, MD  Cardiologist:  Dr. Angelena Form Electrophysiologist: Dr. Lovena Le    Chief Complaint: *** annual visit  History of Present Illness: Melanie Harris is a 61 y.o. female with history of HTN, HLD, smoking, ectopy, known to have SVT (described as brief runs), PACs, PVCs.  She comes in today to be seen for Dr. Lovena Le, last seen by him Nov 2020, she had a increase in her palpitations at that time, discussed adding flecainide would be an option, I encouraged her to avoid this for now due to the benign nature of her condition  She saw A. [place], [place] in April 2021, with increased palpitations.  Her ekg that day noted no ectopy, discussed benign nature of her ectopy, discussed treatment options, decided on watchful waiting and symptom surveillance.  I saw her Dec 2021 She continues to have palpitations, behavior and frequency about the same. They are brief flutters for the most part that seem to wax/wane, with no real trigger or pattern. Infrequently will have palpitations that feel like fast HR that lasts 10-76mn. Neither of them provoke any symptoms, though make her a bit nervous. No CP, no SOB, DOE No dizzy spells, near syncope or syncope. PMD manages/monitors her labs/lipids. No changes were made, recommended annual check-ins   She is seeing plastics for debulking of left nose forehead flap and bilateral nasolabial flap after partial rhinectomy for squamous cell cancer  *** symptoms, palps   Past Medical History:  Diagnosis Date   Abscess of right thigh 07/25/2011   Abscess of thigh    right   Anxiety state 12/21/2015   Cough 08/12/2013   Hypertension    Low grade 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 Pathology negative 06/2016:  LSIL/no hpv testing done   Other emphysema ([facility] 08/12/2013   Pure hypercholesterolemia 12/22/2015   Rash 08/21/2011   Smoking 08/12/2013   Xerosis of skin 02/21/2017    Past Surgical History:  Procedure Laterality Date   ABDOMINAL HYSTERECTOMY  2004   NASAL FLAP ROTATION Left 01/27/2021   Procedure: NASAL FLAP ROTATION;  Surgeon: PCindra Presume MD;  [facility]  Service: Plastics;  Laterality: Left;   NASAL RECONSTRUCTION Left 01/27/2021   Procedure: NASAL RECONSTRUCTION;  Surgeon: PCindra Presume MD;  [facility]  Service: Plastics;  Laterality: Left;   NASAL RECONSTRUCTION Bilateral 02/21/2021   Procedure: DIVISION AND INSET OF NASAL FLAPS, DEBRIDEMENT RIGHT NASOLABIAL FLAP, REVISION OF INSET RIGHT NASOLABIAL FLAP;  Surgeon: PCindra Presume MD;  [facility]  Service: Plastics;  Laterality: Bilateral;   NASAL RECONSTRUCTION Right 03/13/2021   Procedure: Division and Inset forehead flap, with nasal reconstruction with flaps and grafts as necessary, left rib cartilage graft.;  Surgeon: PCindra Presume MD;  [facility]  Service: Plastics;  Laterality: Right;  1.5 hour   RHINOPLASTY Left 01/18/2021   Procedure: PARTIAL RHINECTOMY WITH FROZEN SECTION;  Surgeon: RIzora Gala MD;  [place] [facility]  Service: ENT;  Laterality: Left;   SKIN FULL THICKNESS GRAFT Left 01/27/2021   Procedure: SKIN GRAFT FULL THICKNESS;  Surgeon: PCindra Presume MD;  [facility]  Service: Plastics;  Laterality: Left;   TONSILLECTOMY  10-11 yrs old    Current Outpatient Medications  Medication Sig Dispense Refill  acetaminophen (TYLENOL) 500 MG tablet Take 1,000 mg by mouth every 6 (six) hours as needed for moderate pain (pain). (Patient not taking: Reported on 09/19/2021)     amLODipine (NORVASC) 10 MG tablet TAKE 1 TABLET (10 MG TOTAL) BY MOUTH DAILY. 90 tablet 0   atorvastatin (LIPITOR) 20 MG tablet TAKE 1 TABLET (20 MG TOTAL) BY MOUTH DAILY. 90 tablet 0   cholecalciferol (VITAMIN D)  25 MCG (1000 UNIT) tablet Take 1,000 Units by mouth every morning.     hydrOXYzine (ATARAX) 25 MG tablet Take 1 tablet (25 mg total) by mouth at bedtime as needed. (Patient not taking: Reported on 09/19/2021) 30 tablet 3   magnesium oxide (MAG-OX) 400 MG tablet TAKE 1 TABLET (400 MG TOTAL) BY MOUTH DAILY. 90 tablet 3   metoprolol succinate (TOPROL-XL) 100 MG 24 hr tablet TAKE 1 TABLET (100 MG TOTAL) BY MOUTH DAILY. 90 tablet 0   nicotine (NICODERM CQ) 7 mg/24hr patch Place 1 patch (7 mg total) onto the skin daily. (Patient not taking: Reported on 09/19/2021) 28 patch 1   No current facility-administered medications for this visit.    Allergies:   Morphine and related and Doxycycline   Social History:  The patient  reports that she has been smoking cigarettes. She has a 7.50 pack-year smoking history. She has never used smokeless tobacco. She reports current alcohol use of about 1.0 standard drink of alcohol per week. She reports that she does not use drugs.   Family History:  The patient's family history includes Diabetes in her sister; Dysrhythmia in her sister; Healthy in her daughter; Heart failure in her mother; Hypercholesterolemia in her father; Hyperlipidemia in her mother; Hypertension in her father and mother; Lung cancer in her father.  ROS:  Please see the history of present illness.    All other systems are reviewed and otherwise negative.   PHYSICAL EXAM:  VS:  There were no vitals taken for this visit. BMI: There is no height or weight on file to calculate BMI. Well nourished, well developed, thin body habitus, in no acute distress HEENT: normocephalic, atraumatic Neck: no JVD, carotid bruits or masses Cardiac:  *** RRR; no significant murmurs, no rubs, or gallops Lungs:  *** CTA b/l, no wheezing, rhonchi or rales Abd: soft, nontender MS: no deformity or atrophy Ext: *** no edema Skin: warm and dry, no rash Neuro:  No gross deficits appreciated Psych: euthymic mood, full  affect   EKG:  Not done today  April 2019, 48hr monitor Sinus rhythm Rare premature atrial contractions (70 during monitoring period) Short run of SVT (11 beats) Rare premature ventricular contractions (7 during monitoring period) 6 beat run of ventricular tachycardia.    05/31/2017: TTE Study Conclusions  - Left ventricle: The cavity size was normal. Wall thickness was    normal. Systolic function was normal. The estimated ejection    fraction was in the range of 60% to 65%. Left ventricular    diastolic function parameters were normal.  - Mitral valve: There was mild regurgitation.  - Atrial septum: No defect or patent foramen ovale was identified.    Recent Labs: 12/12/2020: ALT 25 02/21/2021: BUN 13; Creatinine, Ser 0.67; Hemoglobin 13.9; Platelets 255; Potassium 3.9; Sodium 137  No results found for requested labs within last 365 days.   CrCl cannot be calculated (Patient's most recent lab result is older than the maximum 21 days allowed.).   Wt Readings from Last 3 Encounters:  09/19/21 97 lb 14.4 oz (44.4  kg)  07/25/21 97 lb 3.2 oz (44.1 kg)  06/12/21 93 lb (42.2 kg)     Other studies reviewed: Additional studies/records reviewed today include: summarized above  ASSESSMENT AND PLAN:  1. Palpitations     PACs, PVCs, short PSVT     Monitor 2019 as above     *** No change in behavior     *** Discussed keeping adequately hydrated, exercise, and avoiding stimulants  2. HTN     *** No changes today  3. HLD     Monitored and managed with he PMD  Disposition: ***   Current medicines are reviewed at length with the patient today.  The patient did not have any concerns regarding medicines.  Venetia Night, PA-C 09/24/2021 8:12 AM     [facility] [address] [place] [address] [phone] (office)  [phone] (fax)

## 2021-09-27 ENCOUNTER — Ambulatory Visit (INDEPENDENT_AMBULATORY_CARE_PROVIDER_SITE_OTHER): Payer: Self-pay | Admitting: Physician Assistant

## 2021-09-27 ENCOUNTER — Ambulatory Visit (INDEPENDENT_AMBULATORY_CARE_PROVIDER_SITE_OTHER): Payer: Self-pay

## 2021-09-27 ENCOUNTER — Encounter: Payer: Self-pay | Admitting: Physician Assistant

## 2021-09-27 ENCOUNTER — Telehealth: Payer: Self-pay | Admitting: Licensed Clinical Social Worker

## 2021-09-27 VITALS — BP 142/80 | HR 80 | Ht 67.0 in | Wt 97.0 lb

## 2021-09-27 DIAGNOSIS — R002 Palpitations: Secondary | ICD-10-CM

## 2021-09-27 DIAGNOSIS — I10 Essential (primary) hypertension: Secondary | ICD-10-CM

## 2021-09-27 DIAGNOSIS — I493 Ventricular premature depolarization: Secondary | ICD-10-CM

## 2021-09-27 DIAGNOSIS — I491 Atrial premature depolarization: Secondary | ICD-10-CM

## 2021-09-27 NOTE — Telephone Encounter (Signed)
H&V Care Navigation CSW Progress Note  Clinical Social Worker contacted patient by phone to f/u after appt at [facility] this morning. Noted to be uninsured but approved for [employer]  through December, pt may be eligible for [employer] and [employer] if interested. I was unable to reach her at [phone], I have left a message requesting a call back. Will re-attempt as able.   Patient is participating in a Managed Medicaid Plan:  No, self pay only (CAFA approved) CHARITY COMPLETE. PATIENT APPROVED FOR 100% FINANCIAL ASSISTANCE PER CAFA APPLICATION AND SUPPORTING DOCUMENTATION.  CAFA APP SCANNED TO ACCOUNT [id number] APPROVAL DATES OF FPL ---- 07/06/21 - 01/05/22  APPROVAL LETTER ON ACCOUNT [id number]  SDOH Screenings   Alcohol Screen: Not on file  Depression (PHQ2-9): Low Risk  (07/25/2021)   Depression (PHQ2-9)    PHQ-2 Score: 0  Financial Resource Strain: Not on file  Food Insecurity: No Food Insecurity (09/19/2021)   Hunger Vital Sign    Worried About Running Out of Food in the Last Year: Never true    Ran Out of Food in the Last Year: Never true  Housing: Not on file  Physical Activity: Not on file  Social Connections: Not on file  Stress: Not on file  Tobacco Use: High Risk (09/27/2021)   Patient History    Smoking Tobacco Use: Every Day    Smokeless Tobacco Use: Never    Passive Exposure: Not on file  Transportation Needs: No Transportation Needs (09/19/2021)   PRAPARE - Transportation    Lack of Transportation (Medical): No    Lack of Transportation (Non-Medical): No    Westley Hummer, MSW, [place]  [phone]- work cell phone (preferred) [phone]- desk phone

## 2021-09-27 NOTE — Progress Notes (Unsigned)
Enrolled for [employer] to mail a ZIO XT long term holter monitor to the patients address on file.   Dr. McAlhany to read. 

## 2021-09-27 NOTE — Patient Instructions (Addendum)
Medication Instructions:   Your physician recommends that you continue on your current medications as directed. Please refer to the Current Medication list given to you today.  *If you need a refill on your cardiac medications before your next appointment, please call your pharmacy*   Lab Work: [place]    If you have labs (blood work) drawn today and your tests are completely normal, you will receive your results only by: [place] (if you have MyChart) OR A paper copy in the mail If you have any lab test that is abnormal or we need to change your treatment, we will call you to review the results.   Testing/Procedures: Your physician has recommended that you wear an event monitor. Event monitors are medical devices that record the heart's electrical activity. Doctors most often [place] these monitors to diagnose arrhythmias. Arrhythmias are problems with the speed or rhythm of the heartbeat. The monitor is a small, portable device. You can wear one while you do your normal daily activities. This is usually used to diagnose what is causing palpitations/syncope (passing out).    ZIO XT- Long Term Monitor Instructions  Your physician has requested you wear a ZIO patch monitor for 14 days.  This is a single patch monitor. [employer] supplies one patch monitor per enrollment. Additional stickers are not available. Please do not apply patch if you will be having a Nuclear Stress Test,  Echocardiogram, Cardiac CT, MRI, or Chest Xray during the period you would be wearing the  monitor. The patch cannot be worn during these tests. You cannot remove and re-apply the  ZIO XT patch monitor.  Your ZIO patch monitor will be mailed 3 day USPS to your address on file. It may take 3-5 days  to receive your monitor after you have been enrolled.  Once you have received your monitor, please review the enclosed instructions. Your monitor  has already been registered assigning a specific monitor  serial # to you.  Billing and Patient Assistance Program Information  We have supplied [employer] with any of your insurance information on file for billing purposes. [employer] offers a sliding scale Patient Assistance Program for patients that do not have  insurance, or whose insurance does not completely cover the cost of the ZIO monitor.  You must apply for the Patient Assistance Program to qualify for this discounted rate.  To apply, please call [employer] at [phone], select option 4, select option 2, ask to apply for  Patient Assistance Program. Theodore Demark will ask your household income, and how many people  are in your household. They will quote your out-of-pocket cost based on that information.  [employer] will also be able to set up a 33-month interest-free payment plan if needed.  Applying the monitor   Shave hair from upper left chest.  Hold abrader disc by orange tab. Rub abrader in 40 strokes over the upper left chest as  indicated in your monitor instructions.  Clean area with 4 enclosed alcohol pads. Let dry.  Apply patch as indicated in monitor instructions. Patch will be placed under collarbone on left  side of chest with arrow pointing upward.  Rub patch adhesive wings for 2 minutes. Remove white label marked "1". Remove the white  label marked "2". Rub patch adhesive wings for 2 additional minutes.  While looking in a mirror, press and release button in center of patch. A small green light will  flash 3-4 times. This will be your only indicator that the monitor  has been turned on.  Do not shower for the first 24 hours. You may shower after the first 24 hours.  Press the button if you feel a symptom. You will hear a small click. Record Date, Time and  Symptom in the Patient Logbook.  When you are ready to remove the patch, follow instructions on the last 2 pages of Patient  Logbook. Stick patch monitor onto the last page of Patient Logbook.  Place Patient Logbook in the blue  and white box. Use locking tab on box and tape box closed  securely. The blue and white box has prepaid postage on it. Please place it in the mailbox as  soon as possible. Your physician should have your test results approximately 7 days after the  monitor has been mailed back to [facility].  Call [place] at [phone] if you have questions regarding  your ZIO XT patch monitor. Call them immediately if you see an orange light blinking on your  monitor.  If your monitor falls off in less than 4 days, contact our Monitor department at [phone].  If your monitor becomes loose or falls off after 4 days call [employer] at [phone] for  suggestions on securing your monitor   Your next appointment:   4 month(s)  The format for your next appointment:   In Person  Provider:   Cristopher Peru, MD{    Other Instructions   Important Information About Sugar

## 2021-09-28 ENCOUNTER — Encounter: Payer: Self-pay | Admitting: Surgical

## 2021-09-28 ENCOUNTER — Telehealth: Payer: Self-pay | Admitting: Licensed Clinical Social Worker

## 2021-09-28 ENCOUNTER — Telehealth: Payer: Self-pay

## 2021-09-28 ENCOUNTER — Ambulatory Visit (INDEPENDENT_AMBULATORY_CARE_PROVIDER_SITE_OTHER): Payer: No Typology Code available for payment source | Admitting: Surgical

## 2021-09-28 VITALS — BP 130/67 | HR 77 | Ht 67.0 in | Wt 95.0 lb

## 2021-09-28 DIAGNOSIS — D099 Carcinoma in situ, unspecified: Secondary | ICD-10-CM

## 2021-09-28 MED ORDER — HYDROCODONE-ACETAMINOPHEN 5-325 MG PO TABS: 1.0000 | ORAL_TABLET | Freq: Four times a day (QID) | ORAL | 0 refills | Status: AC | PRN

## 2021-09-28 MED ORDER — ONDANSETRON HCL 4 MG PO TABS: 4.0000 mg | ORAL_TABLET | Freq: Three times a day (TID) | ORAL | 0 refills | Status: DC | PRN

## 2021-09-28 MED ORDER — SULFAMETHOXAZOLE-TRIMETHOPRIM 800-160 MG PO TABS: 1.0000 | ORAL_TABLET | Freq: Two times a day (BID) | ORAL | 0 refills | Status: DC

## 2021-09-28 NOTE — Telephone Encounter (Signed)
H&V Care Navigation CSW Progress Note  Clinical Social Worker contacted patient by phone to f/u after appt at [facility] this morning. Noted to be uninsured but approved for [employer]  through December, pt may be eligible for [employer] and [employer] if interested. I was unable to reach her at [phone] again today, I have left a second message requesting a call back. Will re-attempt as able.  Patient is participating in a Managed Medicaid Plan:  No, approved for 100% financial assistance w/ [facility].   SDOH Screenings   Alcohol Screen: Not on file  Depression (PHQ2-9): Low Risk  (07/25/2021)   Depression (PHQ2-9)    PHQ-2 Score: 0  Financial Resource Strain: Not on file  Food Insecurity: No Food Insecurity (09/19/2021)   Hunger Vital Sign    Worried About Running Out of Food in the Last Year: Never true    Ran Out of Food in the Last Year: Never true  Housing: Not on file  Physical Activity: Not on file  Social Connections: Not on file  Stress: Not on file  Tobacco Use: High Risk (09/28/2021)   Patient History    Smoking Tobacco Use: Every Day    Smokeless Tobacco Use: Never    Passive Exposure: Not on file  Transportation Needs: No Transportation Needs (09/19/2021)   PRAPARE - Transportation    Lack of Transportation (Medical): No    Lack of Transportation (Non-Medical): No    Westley Hummer, MSW, [place]  [phone]- work cell phone (preferred) [phone]- desk phone

## 2021-09-28 NOTE — Telephone Encounter (Signed)
   Pre-operative Risk Assessment    Patient Name: Melanie Harris  DOB: Melanie Harris 15, 1962 MRN: [id number]      Request for Surgical Clearance    Procedure:  Revision left nasal forehead flap with complex closure, possible cartilage graft to left nose  Date of Surgery:  Clearance 10/10/21                                 Surgeon:  Dr. Lennice Sites Surgeon's Group or Practice Name:  [facility] Phone number:  [phone] Fax number:  [phone]   Type of Clearance Requested:   - Medical    Type of Anesthesia:  General    Additional requests/questions:   None  Signed, Hyland Mollenkopf   09/28/2021, 1:08 PM

## 2021-09-28 NOTE — Progress Notes (Signed)
Heart and Vascular Care Navigation  09/28/2021  Melanie Harris 1960/09/19 [id number]  Reason for Referral: No prescription coverage Patient is participating in a Managed Medicaid Plan: No, [facility] Financial Assistance only.   Engaged with patient by telephone for initial visit for Heart and Vascular Care Coordination.                                                                                                   Assessment:  LCSW received a call back from pt. I had noted to her to be uninsured but approved for [employer]  through December following her appt at [facility]. Pt confirmed home address, PCP, and confirmed she has been is aware of her [employer]  eligibility. She did not know about [employer] and is interested in pursuing this assistance; she understands not all medications are available, but will continue to get some at [facility]. She lives alone, does not work, recieves assistance from her sister to help with financials/cost of living. No additional questions/concerns at this time- understands she can get in touch with our team for ongoing assistance.                                        HRT/VAS Care Coordination     Patients Home Cardiology Office [facility]   Outpatient Care Team Social Worker   Social Worker Name: Valeda Malm, [place] [facility] [phone]   Living arrangements for the past 2 months Single Family Home   Lives with: Self   Patient Current Insurance Coverage [facility] Assistance   Patient Has Concern With Paying Medical Bills No   Does Patient Have Prescription Coverage? No   Patient Prescription Assistance Programs [place] [facility]   [place] [facility] Medications mailed application   Home Assistive Devices/Equipment Eyeglasses; Walker (specify type)       Social History:                                                                             SDOH Screenings   Alcohol Screen: Not on file   Depression (PHQ2-9): Low Risk  (07/25/2021)   Depression (PHQ2-9)    PHQ-2 Score: 0  Financial Resource Strain: Medium Risk (09/28/2021)   Overall Financial Resource Strain (CARDIA)    Difficulty of Paying Living Expenses: Somewhat hard  Food Insecurity: No Food Insecurity (09/28/2021)   Hunger Vital Sign    Worried About Running Out of Food in the Last Year: Never true    Ran Out of Food in the Last Year: Never true  Housing: Low Risk  (09/28/2021)   Housing    Last Housing Risk Score: 0  Physical Activity: Not on file  Social Connections: Not  on file  Stress: Not on file  Tobacco Use: High Risk (09/28/2021)   Patient History    Smoking Tobacco Use: Every Day    Smokeless Tobacco Use: Never    Passive Exposure: Not on file  Transportation Needs: No Transportation Needs (09/28/2021)   PRAPARE - Transportation    Lack of Transportation (Medical): No    Lack of Transportation (Non-Medical): No    SDOH Interventions: Financial Resources:  Financial Strain Interventions: Other (Comment) (approved for CAFA; mailed [employer] application) Financial Counseling for [employer] (already approved)  Food Insecurity:  Food Insecurity Interventions: Intervention Not Indicated  Housing Insecurity:  Housing Interventions: Intervention Not Indicated  Transportation:   Transportation Interventions: Intervention Not Indicated   Other Care Navigation Interventions:     Provided Pharmacy assistance resources [place] [facility]   Follow-up plan:   LCSW mailed the following: my business card, [employer] application and SNAP card from [employer] just in case that may give pt some additional resources to utilize. I remain available. Will reach out to pt to see if any additional questions/ensure she has received them after her upcoming procedure.

## 2021-09-28 NOTE — Telephone Encounter (Signed)
   Name: Melanie Harris  DOB: 1960-11-15  MRN: [id number]  Primary Cardiologist: Lauree Chandler, MD  Chart reviewed as part of pre-operative protocol coverage. Because of Melanie Harris's past medical history and time since last visit, she will require a follow-up in-office visit in order to better assess preoperative cardiovascular risk.  Pre-op covering staff: - Please schedule appointment and call patient to inform them. If patient already had an upcoming appointment within acceptable timeframe, please add "pre-op clearance" to the appointment notes so provider is aware. - Please contact requesting surgeon's office via preferred method (i.e, phone, fax) to inform them of need for appointment prior to surgery.  No medications indicated that need to be held.  Elgie Collard, PA-C  09/28/2021, 4:29 PM

## 2021-09-28 NOTE — Progress Notes (Signed)
Patient ID: Melanie Harris, female    DOB: [birth year], 61 y.o.   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  female  with a history of facial reconstruction status post Mohs excision.  She presents for preoperative evaluation for upcoming procedure, revision of left nasal forehead flap with complex closure, possible cartilage graft to left nose, scheduled for 10/10/2021 with Dr. Erin Hearing.  The patient has not had problems with anesthesia. No history of DVT/PE.  No family history of DVT/PE.  No family or personal history of bleeding or clotting disorders.  Patient is not currently taking any blood thinners.  No history of CVA/MI.   PMH Significant for: Hypertension, smoker, hypercholesterolemia, heart palpitations.  Patient recently evaluated by cardiology for her SVT, PACs and PVCs.  She reported developing different kind of palpitations in the last few months.  EKG completed on day of appointment, SR normal intervals, no acute changes.  Plan was for remonitoring.  Patient reports she is currently using nicotine products, denies smoking cigarettes or using tobacco products.  She is aware of complications related to nicotine such as vascular compromise.    Patient reports she is feeling well lately, no recent changes in her health.    Of note patient does have a history of MRSA, has been treated in the past with Bactrim, culture show MRSA was sensitive to Bactrim.  She had an abscess of her left chest wall/rib.  She reports she tolerated Bactrim fine.   Of note, she does have a history of cartilage grafting from both her right and left ears.  Past Medical History: Allergies: Allergies  Allergen Reactions   Morphine And Related Nausea And Vomiting   Doxycycline Rash    Current Medications:  Current Outpatient  Medications:    acetaminophen (TYLENOL) 500 MG tablet, Take 1,000 mg by mouth every 6 (six) hours as needed for moderate pain (pain)., Disp: , Rfl:    amLODipine (NORVASC) 10 MG tablet, TAKE 1 TABLET (10 MG TOTAL) BY MOUTH DAILY., Disp: 90 tablet, Rfl: 0   atorvastatin (LIPITOR) 20 MG tablet, TAKE 1 TABLET (20 MG TOTAL) BY MOUTH DAILY., Disp: 90 tablet, Rfl: 0   cholecalciferol (VITAMIN D) 25 MCG (1000 UNIT) tablet, Take 1,000 Units by mouth every morning., Disp: , Rfl:    hydrOXYzine (ATARAX) 25 MG tablet, Take 1 tablet (25 mg total) by mouth at bedtime as needed., Disp: 30 tablet, Rfl: 3   magnesium oxide (MAG-OX) 400 MG tablet, TAKE 1 TABLET (400 MG TOTAL) BY MOUTH DAILY., Disp: 90 tablet, Rfl: 3   metoprolol succinate (TOPROL-XL) 100 MG 24 hr tablet, TAKE 1 TABLET (100 MG TOTAL) BY MOUTH DAILY., Disp: 90 tablet, Rfl: 0   nicotine (NICODERM CQ) 7 mg/24hr patch, Place 1 patch (7 mg total) onto the skin daily., Disp: 28 patch, Rfl: 1   HYDROcodone-acetaminophen (NORCO) 5-325 MG tablet, Take 1 tablet by mouth every 6 (six) hours as needed for up to 5 days for severe pain., Disp: 20 tablet, Rfl: 0   ondansetron (ZOFRAN) 4 MG tablet, Take 1 tablet (4 mg total) by mouth every 8 (eight) hours as needed for nausea or vomiting., Disp: 20 tablet, Rfl: 0   sulfamethoxazole-trimethoprim (BACTRIM DS) 800-160 MG tablet, Take 1 tablet by mouth  2 (two) times daily for 5 days., Disp: 10 tablet, Rfl: 0  Past Medical Problems: Past Medical History:  Diagnosis Date   Abscess of right thigh 07/25/2011   Abscess of thigh    right   Anxiety state 12/21/2015   Cough 08/12/2013   Hypertension    Low grade 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 Pathology negative 06/2016: LSIL/no hpv testing done   Other emphysema ([place]) 08/12/2013   Pure hypercholesterolemia 12/22/2015   Rash 08/21/2011   Smoking  08/12/2013   Xerosis of skin 02/21/2017    Past Surgical History: Past Surgical History:  Procedure Laterality Date   ABDOMINAL HYSTERECTOMY  2004   NASAL FLAP ROTATION Left 01/27/2021   Procedure: NASAL FLAP ROTATION;  Surgeon: [name], MD;  Location: [place];  Service: Plastics;  Laterality: Left;   NASAL RECONSTRUCTION Left 01/27/2021   Procedure: NASAL RECONSTRUCTION;  Surgeon: [name], MD;  Location: [place];  Service: Plastics;  Laterality: Left;   NASAL RECONSTRUCTION Bilateral 02/21/2021   Procedure: DIVISION AND INSET OF NASAL FLAPS, DEBRIDEMENT RIGHT NASOLABIAL FLAP, REVISION OF INSET RIGHT NASOLABIAL FLAP;  Surgeon: [name], MD;  Location: [place];  Service: Plastics;  Laterality: Bilateral;   NASAL RECONSTRUCTION Right 03/13/2021   Procedure: Division and Inset forehead flap, with nasal reconstruction with flaps and grafts as necessary, left rib cartilage graft.;  Surgeon: [name], MD;  Location: [place];  Service: Plastics;  Laterality: Right;  1.5 hour   RHINOPLASTY Left 01/18/2021   Procedure: PARTIAL RHINECTOMY WITH FROZEN SECTION;  Surgeon: [name], MD;  Location: [place];  Service: ENT;  Laterality: Left;   SKIN FULL THICKNESS GRAFT Left 01/27/2021   Procedure: SKIN GRAFT FULL THICKNESS;  Surgeon: [name], MD;  Location: [place];  Service: Plastics;  Laterality: Left;   TONSILLECTOMY  10-12 yrs old    Social History: Social History   Socioeconomic History   Marital status: Divorced    Spouse name: Not on file   Number of children: 1   Years of education: 1   Highest education level: High school graduate  Occupational History   Not on file  Tobacco Use   Smoking status: Every Day    Packs/day: 0.25    Years: 30.00    Total pack years: 7.50    Types: Cigarettes   Smokeless tobacco: Never   Tobacco comments:    4 cigs per day  Vaping Use   Vaping Use: Never used  Substance and Sexual Activity   Alcohol use: Yes     Alcohol/week: 1.0 standard drink of alcohol    Types: 1 Glasses of wine per week    Comment: occassional   Drug use: No   Sexual activity: Not Currently  Other Topics Concern   Not on file  Social History Narrative   Not on file   Social Determinants of Health   Financial Resource Strain: Not on file  Food Insecurity: No Food Insecurity (09/19/2021)   Hunger Vital Sign    Worried About Running Out of Food in the Last Year: Never true    Ran Out of Food in the Last Year: Never true  Transportation Needs: No Transportation Needs (09/19/2021)   PRAPARE - Hydrologist (Medical): No    Lack of Transportation (Non-Medical): No  Physical Activity: Not on  file  Stress: Not on file  Social Connections: Not on file  Intimate Partner Violence: Not on file    Family History: Family History  Problem Relation Age of Onset   Heart failure Mother        started in her 35's   Hypertension Mother    Hyperlipidemia Mother    Lung cancer Father    Hypertension Father    Hypercholesterolemia Father    Diabetes Sister        diet controlled   Dysrhythmia Sister        possible in the past   Healthy Daughter    Breast cancer Neg Hx     Review of Systems: Review of Systems  Constitutional: Negative.   Respiratory: Negative.    Cardiovascular: Negative.   Gastrointestinal: Negative.   Neurological: Negative.     Physical Exam: Vital Signs BP 130/67 (BP Location: Left Arm, Patient Position: Sitting, Cuff Size: Small)   Pulse 77   Ht '5\' 7"'$  (1.702 m)   Wt 95 lb (43.1 kg)   SpO2 99%   BMI 14.88 kg/m   Physical Exam Constitutional:      General: Not in acute distress.    Appearance: Normal appearance. Not ill-appearing.  HENT:     Head: Normocephalic and atraumatic.  Forehead, nasal, bilateral cheek incisions intact and well-healed. Eyes:     Pupils: Pupils are equal, round Neck:     Musculoskeletal: Normal range of motion.  Cardiovascular:      Rate and Rhythm: Normal rate    Pulses: Normal pulses.  Pulmonary:     Effort: Pulmonary effort is normal. No respiratory distress.  Abdominal:     General: Abdomen is flat. There is no distension.  Musculoskeletal: Normal range of motion.  Skin:    General: Skin is warm and dry.     Findings: No erythema or rash.  Neurological:     General: No focal deficit present.     Mental Status: Alert and oriented to person, place, and time. Mental status is at baseline.     Motor: No weakness.  Psychiatric:        Mood and Affect: Mood normal.        Behavior: Behavior normal.    Assessment/Plan: The patient is scheduled for revision of left nasal forehead flap with complex closure and possible cartilage graft to the left nose with Dr. Erin Hearing.  Risks, benefits, and alternatives of procedure discussed, questions answered and consent obtained.    Smoking Status: Denies smoking, is currently using nicotine replacement products; Counseling Given?  We did discussed increased risk of complications related to use of nicotine including vascular compromise and delayed wound healing.  Caprini Score: 3, moderate; Risk Factors include: Age, nicotine use, and length of planned surgery. Recommendation for mechanical prophylaxis. Encourage early ambulation.   Pictures obtained: '@consult'$  09/04/2021  Post-op Rx sent to pharmacy:  Zofran, Norco, Bactrim Prescription for Bactrim sent to patient's pharmacy due to her history of MRSA, will prophylactically treat to decrease risk of postoperative infection.  Patient was provided with the General Surgical Risk consent document and Pain Medication Agreement prior to their appointment.  They had adequate time to read through the risk consent documents and Pain Medication Agreement. We also discussed them in person together during this preop appointment. All of their questions were answered to their satisfaction.  Recommended calling if they have any further questions.   Risk consent form and Pain Medication Agreement to be scanned into  patient's chart.  We will send cardiac clearance to patient's cardiologist.  Electronically signed by: Carola Rhine Dandra Velardi, PA-C 09/28/2021 10:03 AM

## 2021-09-28 NOTE — Telephone Encounter (Signed)
Pt agreeable to in office appt for pre op clearance. Pt has appt with DR. McAlhany 09/29/21 @ 2:20. Pt is grateful for the help and the appt.

## 2021-09-28 NOTE — H&P (View-Only) (Signed)
Patient ID: Melanie Harris, female    DOB: [birth year], 61 y.o.   MRN: [id number]  Chief Complaint  Patient presents with   Pre-op Exam      ICD-10-CM   1. 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  female  with a history of facial reconstruction status post Mohs excision.  She presents for preoperative evaluation for upcoming procedure, revision of left nasal forehead flap with complex closure, possible cartilage graft to left nose, scheduled for 10/10/2021 with Dr. Erin Hearing.  The patient has not had problems with anesthesia. No history of DVT/PE.  No family history of DVT/PE.  No family or personal history of bleeding or clotting disorders.  Patient is not currently taking any blood thinners.  No history of CVA/MI.   PMH Significant for: Hypertension, smoker, hypercholesterolemia, heart palpitations.  Patient recently evaluated by cardiology for her SVT, PACs and PVCs.  She reported developing different kind of palpitations in the last few months.  EKG completed on day of appointment, SR normal intervals, no acute changes.  Plan was for remonitoring.  Patient reports she is currently using nicotine products, denies smoking cigarettes or using tobacco products.  She is aware of complications related to nicotine such as vascular compromise.    Patient reports she is feeling well lately, no recent changes in her health.    Of note patient does have a history of MRSA, has been treated in the past with Bactrim, culture show MRSA was sensitive to Bactrim.  She had an abscess of her left chest wall/rib.  She reports she tolerated Bactrim fine.   Of note, she does have a history of cartilage grafting from both her right and left ears.  Past Medical History: Allergies: Allergies  Allergen Reactions   Morphine And Related Nausea And Vomiting   Doxycycline Rash    Current Medications:  Current Outpatient  Medications:    acetaminophen (TYLENOL) 500 MG tablet, Take 1,000 mg by mouth every 6 (six) hours as needed for moderate pain (pain)., Disp: , Rfl:    amLODipine (NORVASC) 10 MG tablet, TAKE 1 TABLET (10 MG TOTAL) BY MOUTH DAILY., Disp: 90 tablet, Rfl: 0   atorvastatin (LIPITOR) 20 MG tablet, TAKE 1 TABLET (20 MG TOTAL) BY MOUTH DAILY., Disp: 90 tablet, Rfl: 0   cholecalciferol (VITAMIN D) 25 MCG (1000 UNIT) tablet, Take 1,000 Units by mouth every morning., Disp: , Rfl:    hydrOXYzine (ATARAX) 25 MG tablet, Take 1 tablet (25 mg total) by mouth at bedtime as needed., Disp: 30 tablet, Rfl: 3   magnesium oxide (MAG-OX) 400 MG tablet, TAKE 1 TABLET (400 MG TOTAL) BY MOUTH DAILY., Disp: 90 tablet, Rfl: 3   metoprolol succinate (TOPROL-XL) 100 MG 24 hr tablet, TAKE 1 TABLET (100 MG TOTAL) BY MOUTH DAILY., Disp: 90 tablet, Rfl: 0   nicotine (NICODERM CQ) 7 mg/24hr patch, Place 1 patch (7 mg total) onto the skin daily., Disp: 28 patch, Rfl: 1   HYDROcodone-acetaminophen (NORCO) 5-325 MG tablet, Take 1 tablet by mouth every 6 (six) hours as needed for up to 5 days for severe pain., Disp: 20 tablet, Rfl: 0   ondansetron (ZOFRAN) 4 MG tablet, Take 1 tablet (4 mg total) by mouth every 8 (eight) hours as needed for nausea or vomiting., Disp: 20 tablet, Rfl: 0   sulfamethoxazole-trimethoprim (BACTRIM DS) 800-160 MG tablet, Take 1 tablet by mouth  2 (two) times daily for 5 days., Disp: 10 tablet, Rfl: 0  Past Medical Problems: Past Medical History:  Diagnosis Date   Abscess of right thigh 07/25/2011   Abscess of thigh    right   Anxiety state 12/21/2015   Cough 08/12/2013   Hypertension    Low grade 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 Pathology negative 06/2016: LSIL/no hpv testing done   Other emphysema ([place]) 08/12/2013   Pure hypercholesterolemia 12/22/2015   Rash 08/21/2011   Smoking  08/12/2013   Xerosis of skin 02/21/2017    Past Surgical History: Past Surgical History:  Procedure Laterality Date   ABDOMINAL HYSTERECTOMY  2004   NASAL FLAP ROTATION Left 01/27/2021   Procedure: NASAL FLAP ROTATION;  Surgeon: [name], MD;  Location: [place];  Service: Plastics;  Laterality: Left;   NASAL RECONSTRUCTION Left 01/27/2021   Procedure: NASAL RECONSTRUCTION;  Surgeon: [name], MD;  Location: [place];  Service: Plastics;  Laterality: Left;   NASAL RECONSTRUCTION Bilateral 02/21/2021   Procedure: DIVISION AND INSET OF NASAL FLAPS, DEBRIDEMENT RIGHT NASOLABIAL FLAP, REVISION OF INSET RIGHT NASOLABIAL FLAP;  Surgeon: [name], MD;  Location: [place];  Service: Plastics;  Laterality: Bilateral;   NASAL RECONSTRUCTION Right 03/13/2021   Procedure: Division and Inset forehead flap, with nasal reconstruction with flaps and grafts as necessary, left rib cartilage graft.;  Surgeon: [name], MD;  Location: [place];  Service: Plastics;  Laterality: Right;  1.5 hour   RHINOPLASTY Left 01/18/2021   Procedure: PARTIAL RHINECTOMY WITH FROZEN SECTION;  Surgeon: [name], MD;  Location: [place];  Service: ENT;  Laterality: Left;   SKIN FULL THICKNESS GRAFT Left 01/27/2021   Procedure: SKIN GRAFT FULL THICKNESS;  Surgeon: [name], MD;  Location: [place];  Service: Plastics;  Laterality: Left;   TONSILLECTOMY  10-12 yrs old    Social History: Social History   Socioeconomic History   Marital status: Divorced    Spouse name: Not on file   Number of children: 1   Years of education: 1   Highest education level: High school graduate  Occupational History   Not on file  Tobacco Use   Smoking status: Every Day    Packs/day: 0.25    Years: 30.00    Total pack years: 7.50    Types: Cigarettes   Smokeless tobacco: Never   Tobacco comments:    4 cigs per day  Vaping Use   Vaping Use: Never used  Substance and Sexual Activity   Alcohol use: Yes     Alcohol/week: 1.0 standard drink of alcohol    Types: 1 Glasses of wine per week    Comment: occassional   Drug use: No   Sexual activity: Not Currently  Other Topics Concern   Not on file  Social History Narrative   Not on file   Social Determinants of Health   Financial Resource Strain: Not on file  Food Insecurity: No Food Insecurity (09/19/2021)   Hunger Vital Sign    Worried About Running Out of Food in the Last Year: Never true    Ran Out of Food in the Last Year: Never true  Transportation Needs: No Transportation Needs (09/19/2021)   PRAPARE - Hydrologist (Medical): No    Lack of Transportation (Non-Medical): No  Physical Activity: Not on  file  Stress: Not on file  Social Connections: Not on file  Intimate Partner Violence: Not on file    Family History: Family History  Problem Relation Age of Onset   Heart failure Mother        started in her 43's   Hypertension Mother    Hyperlipidemia Mother    Lung cancer Father    Hypertension Father    Hypercholesterolemia Father    Diabetes Sister        diet controlled   Dysrhythmia Sister        possible in the past   Healthy Daughter    Breast cancer Neg Hx     Review of Systems: Review of Systems  Constitutional: Negative.   Respiratory: Negative.    Cardiovascular: Negative.   Gastrointestinal: Negative.   Neurological: Negative.     Physical Exam: Vital Signs BP 130/67 (BP Location: Left Arm, Patient Position: Sitting, Cuff Size: Small)   Pulse 77   Ht '5\' 7"'$  (1.702 m)   Wt 95 lb (43.1 kg)   SpO2 99%   BMI 14.88 kg/m   Physical Exam Constitutional:      General: Not in acute distress.    Appearance: Normal appearance. Not ill-appearing.  HENT:     Head: Normocephalic and atraumatic.  Forehead, nasal, bilateral cheek incisions intact and well-healed. Eyes:     Pupils: Pupils are equal, round Neck:     Musculoskeletal: Normal range of motion.  Cardiovascular:      Rate and Rhythm: Normal rate    Pulses: Normal pulses.  Pulmonary:     Effort: Pulmonary effort is normal. No respiratory distress.  Abdominal:     General: Abdomen is flat. There is no distension.  Musculoskeletal: Normal range of motion.  Skin:    General: Skin is warm and dry.     Findings: No erythema or rash.  Neurological:     General: No focal deficit present.     Mental Status: Alert and oriented to person, place, and time. Mental status is at baseline.     Motor: No weakness.  Psychiatric:        Mood and Affect: Mood normal.        Behavior: Behavior normal.    Assessment/Plan: The patient is scheduled for revision of left nasal forehead flap with complex closure and possible cartilage graft to the left nose with Dr. Erin Hearing.  Risks, benefits, and alternatives of procedure discussed, questions answered and consent obtained.    Smoking Status: Denies smoking, is currently using nicotine replacement products; Counseling Given?  We did discussed increased risk of complications related to use of nicotine including vascular compromise and delayed wound healing.  Caprini Score: 3, moderate; Risk Factors include: Age, nicotine use, and length of planned surgery. Recommendation for mechanical prophylaxis. Encourage early ambulation.   Pictures obtained: '@consult'$  09/04/2021  Post-op Rx sent to pharmacy:  Zofran, Norco, Bactrim Prescription for Bactrim sent to patient's pharmacy due to her history of MRSA, will prophylactically treat to decrease risk of postoperative infection.  Patient was provided with the General Surgical Risk consent document and Pain Medication Agreement prior to their appointment.  They had adequate time to read through the risk consent documents and Pain Medication Agreement. We also discussed them in person together during this preop appointment. All of their questions were answered to their satisfaction.  Recommended calling if they have any further questions.   Risk consent form and Pain Medication Agreement to be scanned into  patient's chart.  We will send cardiac clearance to patient's cardiologist.  Electronically signed by: Carola Rhine Curry Dulski, PA-C 09/28/2021 10:03 AM

## 2021-09-29 ENCOUNTER — Encounter: Payer: Self-pay | Admitting: Cardiovascular Disease

## 2021-09-29 ENCOUNTER — Encounter (HOSPITAL_BASED_OUTPATIENT_CLINIC_OR_DEPARTMENT_OTHER): Payer: Self-pay | Admitting: Plastic Surgery

## 2021-09-29 ENCOUNTER — Other Ambulatory Visit (HOSPITAL_BASED_OUTPATIENT_CLINIC_OR_DEPARTMENT_OTHER): Payer: Self-pay

## 2021-09-29 ENCOUNTER — Ambulatory Visit (INDEPENDENT_AMBULATORY_CARE_PROVIDER_SITE_OTHER): Payer: Self-pay | Admitting: Cardiovascular Disease

## 2021-09-29 ENCOUNTER — Other Ambulatory Visit: Payer: Self-pay

## 2021-09-29 VITALS — BP 134/70 | HR 72 | Ht 67.0 in | Wt 99.0 lb

## 2021-09-29 DIAGNOSIS — Z01810 Encounter for preprocedural cardiovascular examination: Secondary | ICD-10-CM

## 2021-09-29 DIAGNOSIS — I471 Supraventricular tachycardia: Secondary | ICD-10-CM

## 2021-09-29 NOTE — Progress Notes (Signed)
Chief Complaint  Patient presents with   Follow-up    SVT, PVCs, Pre-operative cardiac examination   History of Present Illness: 61 yo female with history of HTN, hyperlipidemia, former tobacco abuse, SVT, PVCs, PACs here today for follow up. I have not seen her in the past. She has been followed in our EP clinic by Dr. Lovena Le. She has been on Toprol with rare palpitations. Echo in April 2019 with normal LV function, mild MR. She was most recently seen in our EP clinic on 09/27/21 and reported more palpitations. Cardiac monitor was arranged. She has an upcoming surgical procedure and is here today to discuss preoperative risk. She has an upcoming procedure for revision of her left nasal forehead flap. She has been feeling well. She will begin wearing her cardiac monitor tomorrow. She denies chest pain, dyspnea, dizziness, LE edema.   Primary Care Physician: Charlott Rakes, MD  [facility] Cardiologist: Lovena Le  Past Medical History:  Diagnosis Date   Abscess of right thigh 07/25/2011   Abscess of thigh    right   Anxiety state 12/21/2015   Cough 08/12/2013   Hypertension    Low grade 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 Pathology negative 06/2016: LSIL/no hpv testing done   Other emphysema ([place]) 08/12/2013   Pure hypercholesterolemia 12/22/2015   Rash 08/21/2011   Smoking 08/12/2013   Xerosis of skin 02/21/2017    Past Surgical History:  Procedure Laterality Date   ABDOMINAL HYSTERECTOMY  2004   NASAL FLAP ROTATION Left 01/27/2021   Procedure: NASAL FLAP ROTATION;  Surgeon: [name], MD;  Location: [place];  Service: Plastics;  Laterality: Left;   NASAL RECONSTRUCTION Left 01/27/2021   Procedure: NASAL RECONSTRUCTION;  Surgeon: [name], MD;  Location: [place];  Service: Plastics;  Laterality: Left;   NASAL RECONSTRUCTION Bilateral 02/21/2021   Procedure: DIVISION  AND INSET OF NASAL FLAPS, DEBRIDEMENT RIGHT NASOLABIAL FLAP, REVISION OF INSET RIGHT NASOLABIAL FLAP;  Surgeon: [name], MD;  Location: [place];  Service: Plastics;  Laterality: Bilateral;   NASAL RECONSTRUCTION Right 03/13/2021   Procedure: Division and Inset forehead flap, with nasal reconstruction with flaps and grafts as necessary, left rib cartilage graft.;  Surgeon: [name], MD;  Location: [place];  Service: Plastics;  Laterality: Right;  1.5 hour   RHINOPLASTY Left 01/18/2021   Procedure: PARTIAL RHINECTOMY WITH FROZEN SECTION;  Surgeon: [name], MD;  Location: [place];  Service: ENT;  Laterality: Left;   SKIN FULL THICKNESS GRAFT Left 01/27/2021   Procedure: SKIN GRAFT FULL THICKNESS;  Surgeon: [name], MD;  Location: [place];  Service: Plastics;  Laterality: Left;   TONSILLECTOMY  10-11 yrs old    Current Outpatient Medications  Medication Sig Dispense Refill   acetaminophen (TYLENOL) 500 MG tablet Take 1,000 mg by mouth every 6 (six) hours as needed for moderate pain (pain).     amLODipine (NORVASC) 10 MG tablet TAKE 1 TABLET (10 MG TOTAL) BY MOUTH DAILY. 90 tablet 0   atorvastatin (LIPITOR) 20 MG tablet TAKE 1 TABLET (20 MG TOTAL) BY MOUTH DAILY. 90 tablet 0   cholecalciferol (VITAMIN D) 25 MCG (1000 UNIT) tablet Take 1,000 Units by mouth every morning.     hydrOXYzine (ATARAX) 25 MG tablet Take 1 tablet (25 mg total) by mouth at bedtime as needed. 30 tablet 3   magnesium oxide (  MAG-OX) 400 MG tablet TAKE 1 TABLET (400 MG TOTAL) BY MOUTH DAILY. 90 tablet 3   metoprolol succinate (TOPROL-XL) 100 MG 24 hr tablet TAKE 1 TABLET (100 MG TOTAL) BY MOUTH DAILY. 90 tablet 0   nicotine (NICODERM CQ) 7 mg/24hr patch Place 1 patch (7 mg total) onto the skin daily. 28 patch 1   HYDROcodone-acetaminophen (NORCO) 5-325 MG tablet Take 1 tablet by mouth every 6 (six) hours as needed for up to 5 days for severe pain. (Patient not taking: Reported on 09/29/2021) 20 tablet 0    ondansetron (ZOFRAN) 4 MG tablet Take 1 tablet (4 mg total) by mouth every 8 (eight) hours as needed for nausea or vomiting. (Patient not taking: Reported on 09/29/2021) 20 tablet 0   sulfamethoxazole-trimethoprim (BACTRIM DS) 800-160 MG tablet Take 1 tablet by mouth 2 (two) times daily for 5 days. (Patient not taking: Reported on 09/29/2021) 10 tablet 0   No current facility-administered medications for this visit.    Allergies  Allergen Reactions   Morphine And Related Nausea And Vomiting   Doxycycline Rash    Social History   Socioeconomic History   Marital status: Divorced    Spouse name: Not on file   Number of children: 1   Years of education: 1   Highest education level: High school graduate  Occupational History   Not on file  Tobacco Use   Smoking status: Every Day    Packs/day: 0.25    Years: 30.00    Total pack years: 7.50    Types: Cigarettes   Smokeless tobacco: Never   Tobacco comments:    4 cigs per day  Vaping Use   Vaping Use: Never used  Substance and Sexual Activity   Alcohol use: Yes    Alcohol/week: 1.0 standard drink of alcohol    Types: 1 Glasses of wine per week    Comment: occassional   Drug use: No   Sexual activity: Not Currently  Other Topics Concern   Not on file  Social History Narrative   Not on file   Social Determinants of Health   Financial Resource Strain: Medium Risk (09/28/2021)   Overall Financial Resource Strain (CARDIA)    Difficulty of Paying Living Expenses: Somewhat hard  Food Insecurity: No Food Insecurity (09/28/2021)   Hunger Vital Sign    Worried About Running Out of Food in the Last Year: Never true    Ran Out of Food in the Last Year: Never true  Transportation Needs: No Transportation Needs (09/28/2021)   PRAPARE - Hydrologist (Medical): No    Lack of Transportation (Non-Medical): No  Physical Activity: Not on file  Stress: Not on file  Social Connections: Not on file  Intimate  Partner Violence: Not on file    Family History  Problem Relation Age of Onset   Heart failure Mother        started in her 31's   Hypertension Mother    Hyperlipidemia Mother    Lung cancer Father    Hypertension Father    Hypercholesterolemia Father    Diabetes Sister        diet controlled   Dysrhythmia Sister        possible in the past   Healthy Daughter    Breast cancer Neg Hx     Review of Systems:  As stated in the HPI and otherwise negative.   BP 134/70   Pulse 72   Ht '5\' 7"'$  (  1.702 m)   Wt 99 lb (44.9 kg)   SpO2 98%   BMI 15.51 kg/m   Physical Examination: General: Well developed, well nourished, NAD  HEENT: OP clear, mucus membranes moist  SKIN: warm, dry. No rashes. Neuro: No focal deficits  Musculoskeletal: Muscle strength 5/5 all ext  Psychiatric: Mood and affect normal  Neck: No JVD, no carotid bruits, no thyromegaly, no lymphadenopathy.  Lungs:Clear bilaterally, no wheezes, rhonci, crackles Cardiovascular: Regular rate and rhythm. No murmurs, gallops or rubs. Abdomen:Soft. Bowel sounds present. Non-tender.  Extremities: No lower extremity edema. Pulses are 2 + in the bilateral DP/PT.  EKG:  EKG is not ordered today. The ekg ordered today demonstrates   Recent Labs: 12/12/2020: ALT 25 02/21/2021: BUN 13; Creatinine, Ser 0.67; Hemoglobin 13.9; Platelets 255; Potassium 3.9; Sodium 137   Lipid Panel    Component Value Date/Time   CHOL 177 06/09/2020 1625   TRIG 101 06/09/2020 1625   HDL 98 06/09/2020 1625   CHOLHDL 1.8 06/09/2020 1625   CHOLHDL 1.7 12/21/2015 1230   VLDL 14 12/21/2015 1230   LDLCALC 61 06/09/2020 1625     Wt Readings from Last 3 Encounters:  09/29/21 99 lb (44.9 kg)  09/28/21 95 lb (43.1 kg)  09/27/21 97 lb (44 kg)      Assessment and Plan:   1. SVT: She has some palpitations. She will begin wearing her cardiac monitor today.   2.  Pre-operative cardiovascular examination: She has no concerning symptoms suggestive of  angina, CHF or arrhythmias. She can proceed with her planned surgical procedure.   Labs/ tests ordered today include:  No orders of the defined types were placed in this encounter.  Disposition:   F/U with Tommye Standard, PA-C as planned in 4 months.    Signed, Lauree Chandler, MD 09/29/2021 2:42 PM    [facility] [address], [place], [place]  [address] Phone: [phone]; Fax: [phone]

## 2021-09-29 NOTE — Patient Instructions (Signed)
Medication Instructions:  NO CHANGES *If you need a refill on your cardiac medications before your next appointment, please call your pharmacy*   Lab Work: NONE If you have labs (blood work) drawn today and your tests are completely normal, you will receive your results only by: [place] (if you have MyChart) OR A paper copy in the mail If you have any lab test that is abnormal or we need to change your treatment, we will call you to review the results.   Testing/Procedures: NONE   Follow-Up: WITH DR Lovena Le AS PLANNED  Important Information About Sugar

## 2021-09-30 DIAGNOSIS — R002 Palpitations: Secondary | ICD-10-CM

## 2021-10-02 ENCOUNTER — Telehealth: Payer: Self-pay

## 2021-10-02 NOTE — Telephone Encounter (Signed)
Faxed surgical clearance to Dr. Lauree Chandler

## 2021-10-05 ENCOUNTER — Other Ambulatory Visit: Payer: Self-pay

## 2021-10-05 ENCOUNTER — Encounter: Payer: No Typology Code available for payment source | Admitting: Surgical

## 2021-10-06 ENCOUNTER — Telehealth: Payer: Self-pay

## 2021-10-06 ENCOUNTER — Other Ambulatory Visit: Payer: Self-pay

## 2021-10-06 ENCOUNTER — Telehealth: Payer: Self-pay | Admitting: Cardiovascular Disease

## 2021-10-06 NOTE — Telephone Encounter (Signed)
Calling to F/U on Clearance due to pt being schedule for procedure on 10/10/21. Please advise

## 2021-10-06 NOTE — Telephone Encounter (Signed)
Called to follow up SX clearance for surgery 9/5. Patient had office visit with Dr. Angelena Form on 09/29/2021. Office staff will follow up with clearance and fax back.

## 2021-10-06 NOTE — Telephone Encounter (Signed)
I s/w the pt today who is calling our office checking on her clearance from Dr. Angelena Form. I did assure the pt that our office has faxed x 4 Dr. Camillia Herter ov notes giving clearance. I did also inform the pt, that I have also sent a message to the requesting office that we have faxed ov notes giving clearance x 4. I explained to the pt that we are also in the same computer system as they are a part of [facility], meaning the notes are visible to them in the computer. I assured the pt that I take my position seriously and that we are very through in making sure all notes are completed in any aspect for caring for our pt's. Pt states thank you very much for reaching out to her and letting her know.

## 2021-10-10 ENCOUNTER — Encounter (HOSPITAL_BASED_OUTPATIENT_CLINIC_OR_DEPARTMENT_OTHER): Payer: Self-pay | Admitting: Plastic Surgery

## 2021-10-10 ENCOUNTER — Encounter (HOSPITAL_BASED_OUTPATIENT_CLINIC_OR_DEPARTMENT_OTHER)
Admission: RE | Disposition: A | Discharge status: Home / Self Care | Payer: Self-pay | Source: Home / Self Care | Attending: Plastic Surgery

## 2021-10-10 ENCOUNTER — Other Ambulatory Visit: Payer: Self-pay

## 2021-10-10 ENCOUNTER — Ambulatory Visit (HOSPITAL_BASED_OUTPATIENT_CLINIC_OR_DEPARTMENT_OTHER)
Admission: RE | Admit: 2021-10-10 | Discharge: 2021-10-10 | Disposition: A | Discharge status: Home / Self Care | Payer: Self-pay | Attending: Plastic Surgery | Admitting: Plastic Surgery

## 2021-10-10 ENCOUNTER — Ambulatory Visit (HOSPITAL_BASED_OUTPATIENT_CLINIC_OR_DEPARTMENT_OTHER): Payer: Self-pay | Admitting: Anesthesiology

## 2021-10-10 DIAGNOSIS — Z01818 Encounter for other preprocedural examination: Secondary | ICD-10-CM

## 2021-10-10 DIAGNOSIS — C44321 Squamous cell carcinoma of skin of nose: Secondary | ICD-10-CM

## 2021-10-10 DIAGNOSIS — D098 Carcinoma in situ of other specified sites: Secondary | ICD-10-CM | POA: Insufficient documentation

## 2021-10-10 DIAGNOSIS — I10 Essential (primary) hypertension: Secondary | ICD-10-CM | POA: Insufficient documentation

## 2021-10-10 DIAGNOSIS — J449 Chronic obstructive pulmonary disease, unspecified: Secondary | ICD-10-CM | POA: Insufficient documentation

## 2021-10-10 DIAGNOSIS — Z9889 Other specified postprocedural states: Secondary | ICD-10-CM | POA: Insufficient documentation

## 2021-10-10 DIAGNOSIS — E7800 Pure hypercholesterolemia, unspecified: Secondary | ICD-10-CM | POA: Insufficient documentation

## 2021-10-10 DIAGNOSIS — R002 Palpitations: Secondary | ICD-10-CM | POA: Insufficient documentation

## 2021-10-10 DIAGNOSIS — F17200 Nicotine dependence, unspecified, uncomplicated: Secondary | ICD-10-CM | POA: Insufficient documentation

## 2021-10-10 DIAGNOSIS — M950 Acquired deformity of nose: Secondary | ICD-10-CM

## 2021-10-10 HISTORY — DX: Palpitations: R00.2

## 2021-10-10 HISTORY — PX: NASAL FLAP ROTATION: SHX5366

## 2021-10-10 SURGERY — CREATION, FLAP, PEDICLED ROTATION, FOREHEAD AND NOSE
Anesthesia: General | Site: Nose | Laterality: Left

## 2021-10-10 MED ORDER — CHLORHEXIDINE GLUCONATE CLOTH 2 % EX PADS: 6.0000 | MEDICATED_PAD | Freq: Once | CUTANEOUS | Status: DC

## 2021-10-10 MED ORDER — LACTATED RINGERS IV SOLN
INTRAVENOUS | Status: DC
Start: 2021-10-10 — End: 2021-10-10

## 2021-10-10 MED ORDER — ONDANSETRON HCL 4 MG/2ML IJ SOLN
INTRAMUSCULAR | Status: AC
  Filled 2021-10-10: qty 2

## 2021-10-10 MED ORDER — TOBRAMYCIN-DEXAMETHASONE 0.3-0.1 % OP SUSP
OPHTHALMIC | Status: AC
  Filled 2021-10-10: qty 2.5

## 2021-10-10 MED ORDER — NEOMYCIN-POLYMYXIN-DEXAMETH 3.5-10000-0.1 OP OINT
TOPICAL_OINTMENT | OPHTHALMIC | Status: AC
  Filled 2021-10-10: qty 3.5

## 2021-10-10 MED ORDER — BUPIVACAINE-EPINEPHRINE (PF) 0.25% -1:200000 IJ SOLN
INTRAMUSCULAR | Status: AC
  Filled 2021-10-10: qty 60

## 2021-10-10 MED ORDER — AMISULPRIDE (ANTIEMETIC) 5 MG/2ML IV SOLN
10.0000 mg | Freq: Once | INTRAVENOUS | Status: DC | PRN
Start: 2021-10-10 — End: 2021-10-10

## 2021-10-10 MED ORDER — BACITRACIN-POLYMYXIN B 500-10000 UNIT/GM OP OINT
TOPICAL_OINTMENT | OPHTHALMIC | Status: AC
  Filled 2021-10-10: qty 3.5

## 2021-10-10 MED ORDER — BSS IO SOLN
INTRAOCULAR | Status: AC
  Filled 2021-10-10: qty 15

## 2021-10-10 MED ORDER — DEXAMETHASONE SODIUM PHOSPHATE 10 MG/ML IJ SOLN
INTRAMUSCULAR | Status: AC
  Filled 2021-10-10: qty 1

## 2021-10-10 MED ORDER — PROPOFOL 10 MG/ML IV BOLUS
INTRAVENOUS | Status: DC | PRN
  Administered 2021-10-10: 100 mg via INTRAVENOUS

## 2021-10-10 MED ORDER — ONDANSETRON HCL 4 MG/2ML IJ SOLN: 4.0000 mg | Freq: Once | INTRAMUSCULAR | Status: DC | PRN

## 2021-10-10 MED ORDER — OXYCODONE HCL 5 MG PO TABS
ORAL_TABLET | ORAL | Status: AC
  Filled 2021-10-10: qty 1

## 2021-10-10 MED ORDER — EPHEDRINE SULFATE (PRESSORS) 50 MG/ML IJ SOLN
INTRAMUSCULAR | Status: DC | PRN
  Administered 2021-10-10: 15 mg via INTRAVENOUS

## 2021-10-10 MED ORDER — OXYCODONE HCL 5 MG PO TABS
5.0000 mg | ORAL_TABLET | Freq: Once | ORAL | Status: AC | PRN
  Administered 2021-10-10: 5 mg via ORAL

## 2021-10-10 MED ORDER — BUPIVACAINE-EPINEPHRINE 0.25% -1:200000 IJ SOLN
INTRAMUSCULAR | Status: DC | PRN
  Administered 2021-10-10: 5 mL

## 2021-10-10 MED ORDER — BUPIVACAINE HCL (PF) 0.25 % IJ SOLN
INTRAMUSCULAR | Status: AC
  Filled 2021-10-10: qty 30

## 2021-10-10 MED ORDER — LIDOCAINE 2% (20 MG/ML) 5 ML SYRINGE
INTRAMUSCULAR | Status: AC
  Filled 2021-10-10: qty 5

## 2021-10-10 MED ORDER — FENTANYL CITRATE (PF) 100 MCG/2ML IJ SOLN
INTRAMUSCULAR | Status: AC
  Filled 2021-10-10: qty 2

## 2021-10-10 MED ORDER — DEXAMETHASONE SODIUM PHOSPHATE 4 MG/ML IJ SOLN
INTRAMUSCULAR | Status: DC | PRN
  Administered 2021-10-10: 5 mg via INTRAVENOUS

## 2021-10-10 MED ORDER — LIDOCAINE-EPINEPHRINE 1 %-1:100000 IJ SOLN
INTRAMUSCULAR | Status: AC
  Filled 2021-10-10: qty 3

## 2021-10-10 MED ORDER — OXYCODONE HCL 5 MG/5ML PO SOLN: 5.0000 mg | Freq: Once | ORAL | Status: AC | PRN

## 2021-10-10 MED ORDER — FENTANYL CITRATE (PF) 100 MCG/2ML IJ SOLN: 25.0000 ug | INTRAMUSCULAR | Status: DC | PRN

## 2021-10-10 MED ORDER — CEFAZOLIN SODIUM-DEXTROSE 2-4 GM/100ML-% IV SOLN
INTRAVENOUS | Status: AC
  Filled 2021-10-10: qty 100

## 2021-10-10 MED ORDER — CEFAZOLIN SODIUM-DEXTROSE 2-4 GM/100ML-% IV SOLN
2.0000 g | INTRAVENOUS | Status: AC
  Administered 2021-10-10: 2 g via INTRAVENOUS

## 2021-10-10 MED ORDER — ACETAMINOPHEN 500 MG PO TABS
ORAL_TABLET | ORAL | Status: AC
  Filled 2021-10-10: qty 2

## 2021-10-10 MED ORDER — EPHEDRINE 5 MG/ML INJ
INTRAVENOUS | Status: AC
  Filled 2021-10-10: qty 5

## 2021-10-10 MED ORDER — MIDAZOLAM HCL 2 MG/2ML IJ SOLN
INTRAMUSCULAR | Status: AC
  Filled 2021-10-10: qty 2

## 2021-10-10 MED ORDER — OXYMETAZOLINE HCL 0.05 % NA SOLN
NASAL | Status: AC
  Filled 2021-10-10: qty 30

## 2021-10-10 MED ORDER — LIDOCAINE HCL (CARDIAC) PF 100 MG/5ML IV SOSY
PREFILLED_SYRINGE | INTRAVENOUS | Status: DC | PRN
  Administered 2021-10-10: 40 mg via INTRAVENOUS

## 2021-10-10 MED ORDER — MIDAZOLAM HCL 5 MG/5ML IJ SOLN
INTRAMUSCULAR | Status: DC | PRN
  Administered 2021-10-10: 2 mg via INTRAVENOUS

## 2021-10-10 MED ORDER — EPINEPHRINE PF 1 MG/ML IJ SOLN
INTRAMUSCULAR | Status: AC
  Filled 2021-10-10: qty 1

## 2021-10-10 MED ORDER — FENTANYL CITRATE (PF) 100 MCG/2ML IJ SOLN
INTRAMUSCULAR | Status: DC | PRN
  Administered 2021-10-10: 50 ug via INTRAVENOUS
  Administered 2021-10-10 (×2): 25 ug via INTRAVENOUS

## 2021-10-10 MED ORDER — ACETAMINOPHEN 500 MG PO TABS
1000.0000 mg | ORAL_TABLET | Freq: Once | ORAL | Status: AC
  Administered 2021-10-10: 1000 mg via ORAL

## 2021-10-10 MED ORDER — TRIPLE ANTIBIOTIC 3.5-400-5000 EX OINT
TOPICAL_OINTMENT | CUTANEOUS | Status: DC | PRN
  Administered 2021-10-10: 1 via TOPICAL

## 2021-10-10 MED ORDER — BACITRACIN-NEOMYCIN-POLYMYXIN OINTMENT TUBE
TOPICAL_OINTMENT | CUTANEOUS | Status: AC
  Filled 2021-10-10: qty 14.17

## 2021-10-10 MED ORDER — ONDANSETRON HCL 4 MG/2ML IJ SOLN
INTRAMUSCULAR | Status: DC | PRN
  Administered 2021-10-10: 4 mg via INTRAVENOUS

## 2021-10-10 SURGICAL SUPPLY — 54 items
ADH SKN CLS APL DERMABOND .7 (GAUZE/BANDAGES/DRESSINGS)
BALL CTTN LRG ABS STRL LF (GAUZE/BANDAGES/DRESSINGS)
BLADE HEX COATED 2.75 (ELECTRODE) IMPLANT
BNDG GAUZE DERMACEA FLUFF 4 (GAUZE/BANDAGES/DRESSINGS) IMPLANT
BNDG GZE DERMACEA 4 6PLY (GAUZE/BANDAGES/DRESSINGS)
CANISTER SUCT 1200ML W/VALVE (MISCELLANEOUS) ×2 IMPLANT
COTTONBALL LRG STERILE PKG (GAUZE/BANDAGES/DRESSINGS) IMPLANT
DERMABOND ADVANCED (GAUZE/BANDAGES/DRESSINGS)
DERMABOND ADVANCED .7 DNX12 (GAUZE/BANDAGES/DRESSINGS) IMPLANT
ELECT COATED BLADE 2.86 ST (ELECTRODE) IMPLANT
ELECT NDL BLADE 2-5/6 (NEEDLE) IMPLANT
ELECT NEEDLE BLADE 2-5/6 (NEEDLE) ×1 IMPLANT
ELECT REM PT RETURN 9FT ADLT (ELECTROSURGICAL) ×1
ELECTRODE REM PT RTRN 9FT ADLT (ELECTROSURGICAL) ×2 IMPLANT
GAUZE SPONGE 4X4 12PLY STRL LF (GAUZE/BANDAGES/DRESSINGS) IMPLANT
GAUZE VASELINE FOILPK 1/2 X 72 (GAUZE/BANDAGES/DRESSINGS) IMPLANT
GAUZE XEROFORM 1X8 LF (GAUZE/BANDAGES/DRESSINGS) IMPLANT
GAUZE XEROFORM 5X9 LF (GAUZE/BANDAGES/DRESSINGS) IMPLANT
GLOVE BIO SURGEON STRL SZ 6.5 (GLOVE) IMPLANT
GLOVE BIOGEL M STRL SZ7.5 (GLOVE) ×2 IMPLANT
GLOVE BIOGEL PI IND STRL 8 (GLOVE) ×2 IMPLANT
GOWN STRL REUS W/ TWL LRG LVL3 (GOWN DISPOSABLE) ×4 IMPLANT
GOWN STRL REUS W/ TWL XL LVL3 (GOWN DISPOSABLE) IMPLANT
GOWN STRL REUS W/TWL LRG LVL3 (GOWN DISPOSABLE) ×2
GOWN STRL REUS W/TWL XL LVL3 (GOWN DISPOSABLE) ×1
MARKER SKIN DUAL TIP RULER LAB (MISCELLANEOUS) IMPLANT
NDL HYPO 27GX1-1/4 (NEEDLE) ×2 IMPLANT
NEEDLE HYPO 27GX1-1/4 (NEEDLE) ×1 IMPLANT
PACK BASIN DAY SURGERY FS (CUSTOM PROCEDURE TRAY) ×2 IMPLANT
PACK ENT DAY SURGERY (CUSTOM PROCEDURE TRAY) ×2 IMPLANT
PATTIES SURGICAL .5 X3 (DISPOSABLE) IMPLANT
PENCIL SMOKE EVACUATOR (MISCELLANEOUS) ×2 IMPLANT
SLEEVE SCD COMPRESS KNEE MED (STOCKING) ×2 IMPLANT
SPIKE FLUID TRANSFER (MISCELLANEOUS) IMPLANT
SPONGE GAUZE 2X2 8PLY STRL LF (GAUZE/BANDAGES/DRESSINGS) IMPLANT
STAPLER VISISTAT 35W (STAPLE) IMPLANT
STRIP CLOSURE SKIN 1/2X4 (GAUZE/BANDAGES/DRESSINGS) IMPLANT
SUCTION FRAZIER HANDLE 10FR (MISCELLANEOUS) ×1
SUCTION TUBE FRAZIER 10FR DISP (MISCELLANEOUS) IMPLANT
SUT MNCRL 6-0 UNDY P1 1X18 (SUTURE) IMPLANT
SUT MNCRL AB 3-0 PS2 18 (SUTURE) IMPLANT
SUT MNCRL AB 4-0 PS2 18 (SUTURE) ×2 IMPLANT
SUT MON AB 4-0 PC3 18 (SUTURE) IMPLANT
SUT MON AB 5-0 P3 18 (SUTURE) IMPLANT
SUT MON AB 5-0 PS2 18 (SUTURE) ×2 IMPLANT
SUT MONOCRYL 6-0 P1 1X18 (SUTURE)
SUT PROLENE 5 0 P 3 (SUTURE) IMPLANT
SUT PROLENE 5 0 PS 2 (SUTURE) IMPLANT
SUT PROLENE 6 0 P 1 18 (SUTURE) IMPLANT
SUT VIC AB 5-0 PS2 18 (SUTURE) IMPLANT
SUT VICRYL 4-0 PS2 18IN ABS (SUTURE) IMPLANT
SYR BULB EAR ULCER 3OZ GRN STR (SYRINGE) IMPLANT
TOWEL GREEN STERILE FF (TOWEL DISPOSABLE) ×2 IMPLANT
TRAY DSU PREP LF (CUSTOM PROCEDURE TRAY) ×2 IMPLANT

## 2021-10-10 NOTE — Transfer of Care (Signed)
Immediate Anesthesia Transfer of Care Note  Patient: Melanie Harris  Procedure(s) Performed: Revision left nasal forehead flap with complex closure, possible cartilage graft to left nose (Left: Nose)  Patient Location: PACU  Anesthesia Type:General  Level of Consciousness: awake  Airway & Oxygen Therapy: Patient Spontanous Breathing and Patient connected to face mask oxygen  Post-op Assessment: Report given to RN and Post -op Vital signs reviewed and stable  Post vital signs: Reviewed and stable  Last Vitals:  Vitals Value Taken Time  BP 106/61 10/10/21 0822  Temp    Pulse 83 10/10/21 0822  Resp 18 10/10/21 0822  SpO2 99 % 10/10/21 0822  Vitals shown include unvalidated device data.  Last Pain:  Vitals:   10/10/21 4643  TempSrc: Oral  PainSc: 0-No pain      Patients Stated Pain Goal: 5 (14/27/67 0110)  Complications: No notable events documented.

## 2021-10-10 NOTE — Discharge Instructions (Addendum)
Activity As tolerated: NO showers for 24 hours. NO driving for 24 hours or if taking narcotics (oxycodone, hydrocodone, etc) for pain control after 24 hours. No heavy activities Next tylenol dose after 12:30pm. Diet: Regular. Drink plenty of fluids (Water. Avoid sugar/sodas/diet sodas)  Wound Care: Do not apply any dressing/gauze over nose. Apply antibiotic ointment to nose incisions every day (No neosporin)  When lying down, keep head elevated on 2-3 pillows or back-rest. Sleep in a reclined position for 24 hours if possible.   Special Instructions: Call Doctor if any unusual problems occur such as pain, excessive bleeding, unrelieved Nausea/vomiting, Fever &/or chills Call with questions  Follow-up appointment: Scheduled for next week.  Post Anesthesia Home Care Instructions  Activity: Get plenty of rest for the remainder of the day. A responsible individual must stay with you for 24 hours following the procedure.  For the next 24 hours, DO NOT: -Drive a car -Paediatric nurse -Drink alcoholic beverages -Take any medication unless instructed by your physician -Make any legal decisions or sign important papers.  Meals: Start with liquid foods such as gelatin or soup. Progress to regular foods as tolerated. Avoid greasy, spicy, heavy foods. If nausea and/or vomiting occur, drink only clear liquids until the nausea and/or vomiting subsides. Call your physician if vomiting continues.  Special Instructions/Symptoms: Your throat may feel dry or sore from the anesthesia or the breathing tube placed in your throat during surgery. If this causes discomfort, gargle with warm salt water. The discomfort should disappear within 24 hours.  If you had a scopolamine patch placed behind your ear for the management of post- operative nausea and/or vomiting:  1. The medication in the patch is effective for 72 hours, after which it should be removed.  Wrap patch in a tissue and discard in the trash.  Wash hands thoroughly with soap and water. 2. You may remove the patch earlier than 72 hours if you experience unpleasant side effects which may include dry mouth, dizziness or visual disturbances. 3. Avoid touching the patch. Wash your hands with soap and water after contact with the patch.

## 2021-10-10 NOTE — Anesthesia Procedure Notes (Signed)
Procedure Name: LMA Insertion Date/Time: 10/10/2021 8:03 AM  Performed by: Tawni Millers, CRNAPre-anesthesia Checklist: Patient identified, Emergency Drugs available, Suction available and Patient being monitored Patient Re-evaluated:Patient Re-evaluated prior to induction Oxygen Delivery Method: Circle system utilized Preoxygenation: Pre-oxygenation with 100% oxygen Induction Type: IV induction Ventilation: Mask ventilation without difficulty LMA: LMA inserted LMA Size: 3.0 Number of attempts: 1 Airway Equipment and Method: Bite block Placement Confirmation: positive ETCO2 Tube secured with: Tape Dental Injury: Teeth and Oropharynx as per pre-operative assessment

## 2021-10-10 NOTE — Op Note (Addendum)
Operative Note   DATE OF OPERATION: 10/10/2021  SURGICAL DEPARTMENT: Plastic Surgery  PREOPERATIVE DIAGNOSES:  left nose squamous cell  POSTOPERATIVE DIAGNOSES:  same  PROCEDURE:   Revision of left nose forehead flap with 5 cm complex closure  SURGEON: Diannah Rindfleisch P. Jaiyanna Safran, MD  ASSISTANT: Verdie Shire, PAC  ANESTHESIA:  General.   COMPLICATIONS: None.   INDICATIONS FOR PROCEDURE:  The patient, Melanie Harris is a 61 y.o. female born on 1960/10/30, is here for treatment of revision of left nasal forehead flap. MRN: [id number]  CONSENT:  Informed consent was obtained directly from the patient. Risks, benefits and alternatives were fully discussed. Specific risks including but not limited to bleeding, infection, hematoma, seroma, scarring, pain, contracture, asymmetry, wound healing problems, and need for further surgery were all discussed. The patient did have an ample opportunity to have questions answered to satisfaction.   DESCRIPTION OF PROCEDURE:  The patient was taken to the operating room. SCDs were placed and perop antibiotics were given. General anesthesia was administered.  The patient's operative site was prepped and draped in a sterile fashion. A time out was performed and all information was confirmed to be correct.    5 cc of quarter percent Marcaine with epinephrine was injected into the left nose flap.  A 5 cm incision was made with a 15 blade and dissection was carried inferiorly.  The nasal flap was thinned with a 15 blade and the tissue under the nasal flap was sent to pathology.  Following this Bovie electrocautery was used for hemostasis and wound was closed in a layered fashion using 5-0 Monocryl for dermis followed by 5-0 Prolene for skin.  The advanced practice practitioner (APP) assisted throughout the case.  The APP was essential in retraction and counter traction when needed to make the case progress smoothly.  This retraction and assistance made it possible to see  the tissue planes for the procedure.  The assistance was needed for hemostasis, tissue re-approximation and closure of the incision site.    The patient tolerated the procedure well.  There were no complications. The patient was allowed to wake from anesthesia, extubated and taken to the recovery room in satisfactory condition.

## 2021-10-10 NOTE — Interval H&P Note (Signed)
History and Physical Interval Note:  10/10/2021 7:23 AM  Melanie Harris  has presented today for surgery, with the diagnosis of Squamous cell carcinoma insitu.  The various methods of treatment have been discussed with the patient and family. After consideration of risks, benefits and other options for treatment, the patient has consented to  Procedure(s): Revision left nasal forehead flap with complex closure, possible cartilage graft to left nose (Left) as a surgical intervention.  The patient's history has been reviewed, patient examined, no change in status, stable for surgery.  I have reviewed the patient's chart and labs.  Questions were answered to the patient's satisfaction.     Lennice Sites

## 2021-10-10 NOTE — Anesthesia Postprocedure Evaluation (Signed)
Anesthesia Post Note  Patient: Melanie Harris  Procedure(s) Performed: Revision left nasal forehead flap with complex closure, possible cartilage graft to left nose (Left: Nose)     Patient location during evaluation: PACU Anesthesia Type: General Level of consciousness: awake and alert Pain management: pain level controlled Vital Signs Assessment: post-procedure vital signs reviewed and stable Respiratory status: spontaneous breathing, nonlabored ventilation and respiratory function stable Cardiovascular status: blood pressure returned to baseline and stable Postop Assessment: no apparent nausea or vomiting Anesthetic complications: no   No notable events documented.  Last Vitals:  Vitals:   10/10/21 0830 10/10/21 0835  BP: 107/63 121/66  Pulse: 82 82  Resp: 16 16  Temp:  (!) 36.2 C  SpO2: 100% 99%    Last Pain:  Vitals:   10/10/21 0836  TempSrc:   PainSc: 0-No pain                 Lidia Collum

## 2021-10-10 NOTE — Anesthesia Preprocedure Evaluation (Addendum)
Anesthesia Evaluation  Patient identified by MRN, date of birth, ID band Patient awake    Reviewed: Allergy & Precautions, NPO status , Patient's Chart, lab work & pertinent test results  History of Anesthesia Complications Negative for: history of anesthetic complications  Airway Mallampati: II  TM Distance: <3 FB Neck ROM: Full    Dental  (+) Dental Advisory Given, Teeth Intact   Pulmonary COPD, Current SmokerPatient did not abstain from smoking.,    Pulmonary exam normal        Cardiovascular hypertension, Pt. on medications and Pt. on home beta blockers Normal cardiovascular exam  2019 Echo: EF 60-65%, mild MR   Neuro/Psych negative neurological ROS     GI/Hepatic negative GI ROS, Neg liver ROS,   Endo/Other  negative endocrine ROS  Renal/GU negative Renal ROS  negative genitourinary   Musculoskeletal negative musculoskeletal ROS (+)   Abdominal   Peds  Hematology negative hematology ROS (+)   Anesthesia Other Findings   Reproductive/Obstetrics                            Anesthesia Physical Anesthesia Plan  ASA: 2  Anesthesia Plan: General   Post-op Pain Management: Tylenol PO (pre-op)*   Induction: Intravenous  PONV Risk Score and Plan: 2 and Ondansetron, Dexamethasone, Midazolam and Treatment may vary due to age or medical condition  Airway Management Planned: LMA and Oral ETT  Additional Equipment: None  Intra-op Plan:   Post-operative Plan: Extubation in OR  Informed Consent: I have reviewed the patients History and Physical, chart, labs and discussed the procedure including the risks, benefits and alternatives for the proposed anesthesia with the patient or authorized representative who has indicated his/her understanding and acceptance.     Dental advisory given  Plan Discussed with:   Anesthesia Plan Comments:         Anesthesia Quick Evaluation

## 2021-10-11 ENCOUNTER — Encounter (HOSPITAL_BASED_OUTPATIENT_CLINIC_OR_DEPARTMENT_OTHER): Payer: Self-pay | Admitting: Plastic Surgery

## 2021-10-11 LAB — SURGICAL PATHOLOGY

## 2021-10-12 ENCOUNTER — Telehealth: Payer: Self-pay | Admitting: Licensed Clinical Social Worker

## 2021-10-12 NOTE — Telephone Encounter (Signed)
H&V Care Navigation CSW Progress Note  Clinical Social Worker contacted patient by phone to f/u on [employer] application. No answer at [phone] (note recent procedure). Left voicemail, will reattempt again as able.   Patient is participating in a Managed Medicaid Plan:  No, self pay only. [facility] Financial Assistance only.   SDOH Screenings   Food Insecurity: No Food Insecurity (09/28/2021)  Housing: Low Risk  (09/28/2021)  Transportation Needs: No Transportation Needs (09/28/2021)  Depression (PHQ2-9): Low Risk  (07/25/2021)  Financial Resource Strain: Medium Risk (09/28/2021)  Tobacco Use: High Risk (10/11/2021)    Westley Hummer, MSW, [place]  [phone]- work cell phone (preferred) [phone]- desk phone

## 2021-10-13 ENCOUNTER — Telehealth: Payer: Self-pay | Admitting: Licensed Clinical Social Worker

## 2021-10-13 NOTE — Telephone Encounter (Signed)
H&V Care Navigation CSW Progress Note  Clinical Social Worker contacted patient by phone to f/u on [facility] application. Pt answered today at [phone]. Re-introduced self, role, reason for call. Pt shares that she is working on application. Has been recovering well from recent procedure (feeling better today). She will mail application to [facility] today. I will f/u to ensure it is received by program. No additional questions/concerns at this time.   Patient is participating in a Managed Medicaid Plan:  No, self pay only. [employer] approved  SDOH Screenings   Food Insecurity: No Food Insecurity (09/28/2021)  Housing: Low Risk  (09/28/2021)  Transportation Needs: No Transportation Needs (09/28/2021)  Depression (PHQ2-9): Low Risk  (07/25/2021)  Financial Resource Strain: Medium Risk (09/28/2021)  Tobacco Use: High Risk (10/11/2021)    Westley Hummer, MSW, [place]  [phone]- work cell phone (preferred) [phone]- desk phone

## 2021-10-16 ENCOUNTER — Ambulatory Visit (INDEPENDENT_AMBULATORY_CARE_PROVIDER_SITE_OTHER): Payer: No Typology Code available for payment source | Admitting: Surgical

## 2021-10-16 DIAGNOSIS — D099 Carcinoma in situ, unspecified: Secondary | ICD-10-CM

## 2021-10-16 NOTE — Progress Notes (Signed)
Patient is a 61 year old female here for follow-up after revision of nasal Mohs reconstruction with Dr. Erin Hearing on 10/10/2021.  The left nasal forehead flap was thinned.  Patient reports she is overall doing well.  Prolene sutures are in place, healing well.  She does report that she has been using the triple antibiotic ointment and this seems to have caused some irritation to her surrounding skin.  She is not having any infectious symptoms.   On exam left nasal incision is intact, healing well.  There is no cellulitic changes.  She does have some erythema surrounding the incision and extending onto her left cheek and nasal bridge which appears consistent with a skin reaction.  No active drainage.  Prolene sutures were removed, patient tolerated this well.  Recommend avoiding use of the triple antibiotic ointment, can use Vaseline on the incision.  We will have her follow-up in 2 weeks for reevaluation with Dr. Erin Hearing and to discuss additional surgical planning for thinning the inferior portion of the flap.  Pictures were obtained of the patient and placed in the chart with the patient's or guardian's permission.

## 2021-10-26 ENCOUNTER — Telehealth: Payer: Self-pay

## 2021-10-26 ENCOUNTER — Telehealth: Payer: Self-pay | Admitting: Licensed Clinical Social Worker

## 2021-10-26 NOTE — Telephone Encounter (Signed)
Dr. Lovena Le Made aware of Melanie Standard PA-C assessment of patients Heart Monitor.  He agreed with Ms. Renee's conclusions, and no follow up is required at this time.

## 2021-10-26 NOTE — Telephone Encounter (Signed)
-----   Message from Rodman Key, RN sent at 10/24/2021  3:37 PM EDT -----  ----- Message ----- From: Burnell Blanks, MD Sent: 10/24/2021   1:51 PM EDT To: Evans Lance, MD; Rodman Key, RN  Pt of Dr. Lovena Le that I saw as DOD. Will route monitor results to him. Gerald Stabs

## 2021-10-26 NOTE — Telephone Encounter (Signed)
H&V Care Navigation CSW Progress Note  Clinical Social Worker contacted patient by phone to f/u on [employer] application. When I checked in earlier in the week with [employer] they shared it had not yet arrived. Pt answered at [phone], she shares she is doing even better than the last time we spoke. She sent in the application last week. I will check back in with [facility] tomorrow and let her know if received or not.   Patient is participating in a Managed Medicaid Plan:  No, self pay only. CAFA approved  SDOH Screenings   Food Insecurity: No Food Insecurity (09/28/2021)  Housing: Low Risk  (09/28/2021)  Transportation Needs: No Transportation Needs (09/28/2021)  Depression (PHQ2-9): Low Risk  (07/25/2021)  Financial Resource Strain: Medium Risk (09/28/2021)  Tobacco Use: High Risk (10/11/2021)   Westley Hummer, MSW, [place]  [phone]- work cell phone (preferred) [phone]- desk phone

## 2021-10-27 ENCOUNTER — Telehealth: Payer: Self-pay | Admitting: Licensed Clinical Social Worker

## 2021-10-27 DIAGNOSIS — E7800 Pure hypercholesterolemia, unspecified: Secondary | ICD-10-CM

## 2021-10-27 DIAGNOSIS — I10 Essential (primary) hypertension: Secondary | ICD-10-CM

## 2021-10-27 NOTE — Telephone Encounter (Signed)
H&V Care Navigation CSW Progress Note  Clinical Social Worker  contacted [employer]  to f/u on application. Pt approved through 10/07/2022. She will need the following medications transitioned to [place] [facility] [address]. I will send request to PCP provider who manages the following: amlodipine, atorvastatin.   Patient is participating in a Managed Medicaid Plan:  No, [facility] financial assistance  [place]: No Food Insecurity (09/28/2021)  Housing: Low Risk  (09/28/2021)  Transportation Needs: No Transportation Needs (09/28/2021)  Depression (PHQ2-9): Low Risk  (07/25/2021)  Financial Resource Strain: Medium Risk (09/28/2021)  Tobacco Use: High Risk (10/11/2021)   Westley Hummer, MSW, [place]  [phone]- work cell phone (preferred) [phone]- desk phone

## 2021-10-30 ENCOUNTER — Ambulatory Visit (INDEPENDENT_AMBULATORY_CARE_PROVIDER_SITE_OTHER): Payer: Self-pay | Admitting: Plastic Surgery

## 2021-10-30 ENCOUNTER — Telehealth: Payer: Self-pay | Admitting: Licensed Clinical Social Worker

## 2021-10-30 ENCOUNTER — Other Ambulatory Visit: Payer: Self-pay | Admitting: Pharmacist

## 2021-10-30 DIAGNOSIS — I10 Essential (primary) hypertension: Secondary | ICD-10-CM

## 2021-10-30 DIAGNOSIS — D098 Carcinoma in situ of other specified sites: Secondary | ICD-10-CM

## 2021-10-30 DIAGNOSIS — E7800 Pure hypercholesterolemia, unspecified: Secondary | ICD-10-CM

## 2021-10-30 DIAGNOSIS — D099 Carcinoma in situ, unspecified: Secondary | ICD-10-CM

## 2021-10-30 MED ORDER — ATORVASTATIN CALCIUM 20 MG PO TABS: ORAL_TABLET | Freq: Every day | ORAL | 1 refills | Status: DC

## 2021-10-30 MED ORDER — AMLODIPINE BESYLATE 10 MG PO TABS: ORAL_TABLET | Freq: Every day | ORAL | 1 refills | Status: DC

## 2021-10-30 NOTE — Telephone Encounter (Signed)
Can you please assist in sending these prescriptions? I seem to have a hard time finding this pharmacy on the drop-down list.  Thank you

## 2021-10-30 NOTE — Telephone Encounter (Signed)
H&V Care Navigation CSW Progress Note  Clinical Social Worker contacted patient by phone to update her on approval for [facility] Note PCP team working on sending medications. Pt aware there may be a delay, that some of her medications she should continue to fill at [facility]. No additional questions/concerns at this time. Pt aware I remain available and encouraged to f/u with me if any additional questions should arise.   Patient is participating in a Managed Medicaid Plan:  No, self pay only. CAFA approved through 01/05/22.   SDOH Screenings   Food Insecurity: No Food Insecurity (09/28/2021)  Housing: Low Risk  (09/28/2021)  Transportation Needs: No Transportation Needs (09/28/2021)  Depression (PHQ2-9): Low Risk  (07/25/2021)  Financial Resource Strain: Medium Risk (09/28/2021)  Tobacco Use: High Risk (10/11/2021)    Westley Hummer, MSW, [place]  [phone]- work cell phone (preferred) [phone]- desk phone

## 2021-10-31 NOTE — Progress Notes (Signed)
   Referring Provider Charlott Rakes, MD [place] [place],  [place] [address]   CC:  Left nasal forehead flap  Melanie Harris is an 61 y.o. female.  HPI: Patient is 61 year old status post left nasal forehead flap revised with complex closure on 10/10/2021.  Patient notes improved contour  Review of Systems General: No fever or chills  Physical Exam    10/10/2021    8:35 AM 10/10/2021    8:30 AM 10/10/2021    8:22 AM  Vitals with BMI  Systolic 446 286 381  Diastolic 66 63 61  Pulse 82 82 84    General:  No acute distress,  Alert and oriented, Non-Toxic, Normal speech and affect HEENT: Improved contour left nasal flap.  Assessment/Plan Left nasal forehead flap advised via superior incision roughly 3 months from previous procedure.  Patient will follow-up in 4 weeks.Lennice Sites 10/31/2021, 12:07 PM

## 2021-11-03 ENCOUNTER — Other Ambulatory Visit: Payer: Self-pay

## 2021-11-07 ENCOUNTER — Other Ambulatory Visit: Payer: Self-pay

## 2021-11-09 ENCOUNTER — Ambulatory Visit: Payer: No Typology Code available for payment source | Admitting: Obstetrics and Gynecology

## 2021-11-27 ENCOUNTER — Ambulatory Visit: Payer: Self-pay | Admitting: Plastic Surgery

## 2021-11-28 ENCOUNTER — Ambulatory Visit (INDEPENDENT_AMBULATORY_CARE_PROVIDER_SITE_OTHER): Payer: Self-pay | Admitting: Physician Assistant

## 2021-11-28 DIAGNOSIS — D049 Carcinoma in situ of skin, unspecified: Secondary | ICD-10-CM

## 2021-11-28 DIAGNOSIS — D0439 Carcinoma in situ of skin of other parts of face: Secondary | ICD-10-CM

## 2021-11-28 NOTE — Progress Notes (Signed)
Referring Provider Charlott Rakes, MD [address] [place],  [place] [address]   CC:  Chief Complaint  Patient presents with   Follow-up      Melanie Harris is an 61 y.o. female.  HPI: Patient is a very pleasant 61 year old female with PMH of SCC s/p Mohs reconstruction with revision performed 10/10/2021 by Dr. Erin Hearing whereby the left nasal forehead flap was thinned who presents to clinic for follow-up.  She was last seen here in clinic on 10/30/2021.  At that time, patient endorsed improved contour of the left nasal flap.  Plan for follow-up in 4 weeks and likely multiple surgical interventions in the future for additional contouring of her reconstruction.  Today, she states that she is doing overall well.  She is pleased with how far she has come with her reconstruction, but would like additional contouring performed.  She is hoping that she can have additional fending and debulking of her forehead flap.  She states that for the past several days she has had some yellowish drainage from her left nare for which she has been cleaning with a Q-tip.  Patient can typically breathe through her left nare, but it has been stopped up the past few days.  She denies any overlying redness, swelling, pain, fevers, or other systemic symptoms.    Allergies  Allergen Reactions   Morphine And Related Nausea And Vomiting   Doxycycline Rash    Outpatient Encounter Medications as of 11/28/2021  Medication Sig Note   acetaminophen (TYLENOL) 500 MG tablet Take 1,000 mg by mouth every 6 (six) hours as needed for moderate pain (pain).    amLODipine (NORVASC) 10 MG tablet TAKE 1 TABLET (10 MG TOTAL) BY MOUTH DAILY.    atorvastatin (LIPITOR) 20 MG tablet TAKE 1 TABLET (20 MG TOTAL) BY MOUTH DAILY.    cholecalciferol (VITAMIN D) 25 MCG (1000 UNIT) tablet Take 1,000 Units by mouth every morning.    magnesium oxide (MAG-OX) 400 MG tablet TAKE 1 TABLET (400 MG TOTAL) BY MOUTH DAILY.     metoprolol succinate (TOPROL-XL) 100 MG 24 hr tablet TAKE 1 TABLET (100 MG TOTAL) BY MOUTH DAILY.    nicotine (NICODERM CQ) 7 mg/24hr patch Place 1 patch (7 mg total) onto the skin daily.    [DISCONTINUED] mupirocin nasal ointment (BACTROBAN) 2 % Place 1 application into the nose 2 (two) times daily. Use one-half of tube in each nostril twice daily for five (5) days. After application, press sides of nose together and gently massage. 06/10/2020: getting luke to change   No facility-administered encounter medications on file as of 11/28/2021.     Past Medical History:  Diagnosis Date   Abscess of right thigh 07/25/2011   Abscess of thigh    right   Anxiety state 12/21/2015   Cough 08/12/2013   Hypertension    Low grade 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 Pathology negative 06/2016: LSIL/no hpv testing done   Palpitation    Pure hypercholesterolemia 12/22/2015   Rash 08/21/2011   Smoking 08/12/2013   Xerosis of skin 02/21/2017    Past Surgical History:  Procedure Laterality Date   ABDOMINAL HYSTERECTOMY  2004   NASAL FLAP ROTATION Left 01/27/2021   Procedure: NASAL FLAP ROTATION;  Surgeon: [name], MD;  Location: [place];  Service: Plastics;  Laterality: Left;   NASAL FLAP ROTATION Left  10/10/2021   Procedure: Revision left nasal forehead flap with complex closure, possible cartilage graft to left nose;  Surgeon: Lennice Sites, MD;  Location: [place];  Service: Plastics;  Laterality: Left;   NASAL RECONSTRUCTION Left 01/27/2021   Procedure: NASAL RECONSTRUCTION;  Surgeon: [name], MD;  Location: [facility];  Service: Plastics;  Laterality: Left;   NASAL RECONSTRUCTION Bilateral 02/21/2021   Procedure: DIVISION AND INSET OF NASAL FLAPS, DEBRIDEMENT RIGHT NASOLABIAL FLAP, REVISION OF INSET RIGHT NASOLABIAL FLAP;  Surgeon: [name], MD;   Location: [place];  Service: Plastics;  Laterality: Bilateral;   NASAL RECONSTRUCTION Right 03/13/2021   Procedure: Division and Inset forehead flap, with nasal reconstruction with flaps and grafts as necessary, left rib cartilage graft.;  Surgeon: [name], MD;  Location: [place];  Service: Plastics;  Laterality: Right;  1.5 hour   RHINOPLASTY Left 01/18/2021   Procedure: PARTIAL RHINECTOMY WITH FROZEN SECTION;  Surgeon: [name], MD;  Location: [facility];  Service: ENT;  Laterality: Left;   SKIN FULL THICKNESS GRAFT Left 01/27/2021   Procedure: SKIN GRAFT FULL THICKNESS;  Surgeon: [name], MD;  Location: [place];  Service: Plastics;  Laterality: Left;   TONSILLECTOMY  10-12 yrs old    Family History  Problem Relation Age of Onset   Heart failure Mother        started in her 60's   Hypertension Mother    Hyperlipidemia Mother    Lung cancer Father    Hypertension Father    Hypercholesterolemia Father    Diabetes Sister        diet controlled   Dysrhythmia Sister        possible in the past   Healthy Daughter    Breast cancer Neg Hx     Social History   Social History Narrative   Not on file     Review of Systems General: Denies fevers or chills Pulmonary: Denies difficulty breathing   Physical Exam    10/10/2021    8:35 AM 10/10/2021    8:30 AM 10/10/2021    8:22 AM  Vitals with BMI  Systolic 213 086 578  Diastolic 66 63 61  Pulse 82 82 84    General:  No acute distress, nontoxic appearing  Respiratory: No increased work of breathing Neuro: Alert and oriented Psychiatric: Normal mood and affect  HEENT: Patient with improved contour of her forehead flap nasal reconstruction.  Her left nare is narrow, partially patent.  No drainage on exam.  Probed with Q-tip, no purulence expressed.  Area is nontender.  No overlying erythema or other skin changes.  Assessment/Plan  Patient is 7 weeks postop from forehead flap thinning/reconstruction revision performed by Dr.  Erin Hearing.  She would like to proceed with additional contouring procedures in the future.  From a postoperative standpoint, she is well-appearing.  While she endorses some drainage, no obvious purulence or other concern for infection on exam today.  She denies any pain or other systemic symptoms.  We will abstain from antibiotics, but encourage patient to closely monitor her drainage and reach out should she have any new or worsening symptoms.  Given that Dr. Erin Hearing is leaving the practice, we will have patient follow-up with Dr. Lovena Le for a 30-minute consult in 2 to 4 weeks.  She understands that additional surgical intervention will not occur for another few months.  Picture(s) obtained of the patient and placed in the chart were with the patient's or  guardian's permission.   Krista Blue 11/28/2021, 10:18 AM

## 2021-11-30 ENCOUNTER — Other Ambulatory Visit: Payer: Self-pay | Admitting: Family Medicine

## 2021-11-30 DIAGNOSIS — I10 Essential (primary) hypertension: Secondary | ICD-10-CM

## 2021-11-30 MED ORDER — METOPROLOL SUCCINATE ER 100 MG PO TB24
ORAL_TABLET | Freq: Every day | ORAL | 0 refills | Status: DC
  Filled 2021-11-30: qty 30, 30d supply, fill #0

## 2021-12-01 ENCOUNTER — Other Ambulatory Visit: Payer: Self-pay

## 2021-12-05 ENCOUNTER — Other Ambulatory Visit: Payer: Self-pay

## 2021-12-12 ENCOUNTER — Other Ambulatory Visit (HOSPITAL_COMMUNITY)
Admission: RE | Admit: 2021-12-12 | Discharge: 2021-12-12 | Disposition: A | Discharge status: Home / Self Care | Payer: Self-pay | Source: Ambulatory Visit | Attending: Obstetrics and Gynecology | Admitting: Obstetrics and Gynecology

## 2021-12-12 ENCOUNTER — Ambulatory Visit (INDEPENDENT_AMBULATORY_CARE_PROVIDER_SITE_OTHER): Payer: No Typology Code available for payment source | Admitting: Obstetrics and Gynecology

## 2021-12-12 ENCOUNTER — Other Ambulatory Visit: Payer: Self-pay

## 2021-12-12 ENCOUNTER — Encounter: Payer: Self-pay | Admitting: Obstetrics and Gynecology

## 2021-12-12 VITALS — BP 132/68 | HR 68 | Ht 67.0 in | Wt 100.5 lb

## 2021-12-12 DIAGNOSIS — R87622 Low grade squamous intraepithelial lesion on cytologic smear of vagina (LGSIL): Secondary | ICD-10-CM | POA: Insufficient documentation

## 2021-12-12 HISTORY — PX: COLPOSCOPY: PRO47

## 2021-12-12 NOTE — Progress Notes (Deleted)
11/2000 negative TAH fibroids, aub June 2023 LSIL/HPV+, neg 16/18/45 Jan 2020  lsil/vain 1

## 2021-12-12 NOTE — Procedures (Signed)
Colposcopy Procedure Note  Pre-operative Diagnosis:  June 2023 pap: LSIL/HPV+, neg 16/18/45 March 2020 colpo: normal, no biopsies done>>repeat pap/hpv testing in one year Jan 2020 pap: LSIL/HPV+, no subtyping done May 2018 pap: LSIL/no hpv testing done-->recommend repeat pap with cotesting one year (no colpo done). Patient was asymptomatic and had pap smear done at her PCP annual visit 2002: TAH for benign indications. Pathology negative   Post-operative Diagnosis: Same  Procedure Details  Patient asymptomatic and denies any bleeding spotting.  The risks (including infection, bleeding, pain) and benefits of the procedure were explained to the patient and written informed consent was obtained.  The patient was placed in the dorsal lithotomy position. A Pederson was speculum inserted in the vagina, and the vagina/cuff visualized.  Acetic acid staining was done and the vagina was viewed with green filter; lugol's staining with green filter was also done .  Biopsy from apex done after injection of 1% lidocaine with epinephrine. There was no bleeding after procedure.   Findings: EGBUS with moderate atrophy. Vagina with moderate atrophy. Normal cuff. No abnormal staining  Specimens: Random vaginal/cuff apical biopsy done   Condition: Stable  Complications: None  Plan: The patient was advised to call for any fever or for prolonged or severe pain or bleeding. She was advised to use OTC analgesics as needed for mild to moderate pain. She was advised to avoid vaginal intercourse for 48 hours or until the bleeding has completely stopped.   Durene Romans MD Attending Center for [employer] [facility])

## 2021-12-13 LAB — SURGICAL PATHOLOGY

## 2021-12-15 ENCOUNTER — Encounter: Payer: Self-pay | Admitting: Plastic Surgery

## 2021-12-15 ENCOUNTER — Ambulatory Visit (INDEPENDENT_AMBULATORY_CARE_PROVIDER_SITE_OTHER): Payer: Self-pay | Admitting: Plastic Surgery

## 2021-12-15 VITALS — BP 128/79 | HR 80

## 2021-12-15 DIAGNOSIS — C44321 Squamous cell carcinoma of skin of nose: Secondary | ICD-10-CM

## 2021-12-15 DIAGNOSIS — D049 Carcinoma in situ of skin, unspecified: Secondary | ICD-10-CM

## 2021-12-15 NOTE — Progress Notes (Signed)
Referring Provider Charlott Rakes, MD [address],  [place] [address]   CC:  Chief Complaint  Patient presents with   Advice Only      Melanie Harris is an 61 y.o. female.  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  The undergone inset of the flap and return for her first revision.  She still has fullness of the flap and is unhappy with the overall aesthetic appearance.  Allergies  Allergen Reactions   Morphine And Related Nausea And Vomiting   Doxycycline Rash    Outpatient Encounter Medications as of 12/15/2021  Medication Sig Note   acetaminophen (TYLENOL) 500 MG tablet Take 1,000 mg by mouth every 6 (six) hours as needed for moderate pain (pain).    amLODipine (NORVASC) 10 MG tablet TAKE 1 TABLET (10 MG TOTAL) BY MOUTH DAILY.    atorvastatin (LIPITOR) 20 MG tablet TAKE 1 TABLET (20 MG TOTAL) BY MOUTH DAILY.    cholecalciferol (VITAMIN D) 25 MCG (1000 UNIT) tablet Take 1,000 Units by mouth every morning.    magnesium oxide (MAG-OX) 400 MG tablet TAKE 1 TABLET (400 MG TOTAL) BY MOUTH DAILY.    metoprolol succinate (TOPROL-XL) 100 MG 24 hr tablet TAKE 1 TABLET (100 MG TOTAL) BY MOUTH DAILY.    nicotine (NICODERM CQ) 7 mg/24hr patch Place 1 patch (7 mg total) onto the skin daily. (Patient not taking: Reported on 12/12/2021)    [DISCONTINUED] mupirocin nasal ointment (BACTROBAN) 2 % Place 1 application into the nose 2 (two) times daily. Use one-half of tube in each nostril twice daily for five (5) days. After application, press sides of nose together and gently massage. 06/10/2020: getting luke to change   No facility-administered encounter medications on file as of 12/15/2021.     Past Medical History:  Diagnosis Date   Abscess of right thigh 07/25/2011   Abscess of thigh    right   Anxiety state 12/21/2015   Cough 08/12/2013   Hypertension    Low  grade 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 Pathology negative 06/2016: LSIL/no hpv testing done   Palpitation    Pure hypercholesterolemia 12/22/2015   Rash 08/21/2011   Smoking 08/12/2013   Xerosis of skin 02/21/2017    Past Surgical History:  Procedure Laterality Date   ABDOMINAL HYSTERECTOMY  2004   COLPOSCOPY  12/12/2021   NASAL FLAP ROTATION Left 01/27/2021   Procedure: NASAL FLAP ROTATION;  Surgeon: [name], MD;  Location: [place];  Service: Plastics;  Laterality: Left;   NASAL FLAP ROTATION Left 10/10/2021   Procedure: Revision left nasal forehead flap with complex closure, possible cartilage graft to left nose;  Surgeon: Lennice Sites, MD;  Location: [place];  Service: Plastics;  Laterality: Left;   NASAL RECONSTRUCTION Left 01/27/2021   Procedure: NASAL RECONSTRUCTION;  Surgeon: [name], MD;  Location: [place];  Service: Plastics;  Laterality: Left;   NASAL RECONSTRUCTION Bilateral 02/21/2021   Procedure: DIVISION AND INSET OF NASAL FLAPS, DEBRIDEMENT RIGHT NASOLABIAL FLAP, REVISION OF INSET RIGHT NASOLABIAL FLAP;  Surgeon: [name], MD;  Location: [place];  Service: Plastics;  Laterality: Bilateral;   NASAL RECONSTRUCTION Right 03/13/2021   Procedure: Division and Inset forehead flap, with nasal reconstruction with flaps and grafts  as necessary, left rib cartilage graft.;  Surgeon: [name], MD;  Location: [place];  Service: Plastics;  Laterality: Right;  1.5 hour   RHINOPLASTY Left 01/18/2021   Procedure: PARTIAL RHINECTOMY WITH FROZEN SECTION;  Surgeon: [name], MD;  Location: [facility];  Service: ENT;  Laterality: Left;   SKIN FULL THICKNESS GRAFT Left 01/27/2021   Procedure: SKIN GRAFT FULL THICKNESS;  Surgeon: [name], MD;  Location: [facility];  Service: Plastics;  Laterality: Left;   TONSILLECTOMY  [age] yrs old     Family History  Problem Relation Age of Onset   Heart failure Mother        started in her 28's   Hypertension Mother    Hyperlipidemia Mother    Lung cancer Father    Hypertension Father    Hypercholesterolemia Father    Diabetes Sister        diet controlled   Dysrhythmia Sister        possible in the past   Healthy Daughter    Breast cancer Neg Hx     Social History   Social History Narrative   Not on file     Review of Systems General: Denies fevers, chills, weight loss CV: Denies chest pain, shortness of breath, palpitations Nose: Reconstruction with a forehead flap  Physical Exam    12/15/2021    2:14 PM 12/12/2021   10:31 AM 10/10/2021    8:35 AM  Vitals with BMI  Height  '5\' 7"'$    Weight  100 lbs 8 oz   BMI  66.59   Systolic 935 701 779  Diastolic 79 68 66  Pulse 80 68 82    General:  No acute distress,  Alert and oriented, Non-Toxic, Normal speech and affect Nose: Patient has an inset forehead flap at the left ala.  She still has significant fullness with loss of the supra alar crease and partial obstruction of the nostril. Mammogram: Not applicable Assessment/Plan Squamous cell carcinoma of the nose: Discussed debulking the flap with the patient with the goal of improving the aesthetic appearance of her reconstruction.  We will schedule for an outpatient procedure.  Risks of infection bleeding and flap loss were discussed.  I also informed her that it is unlikely that this will be her last surgery.  She understands would like to proceed.  Melanie Harris 12/15/2021, 4:42 PM

## 2021-12-18 ENCOUNTER — Ambulatory Visit: Payer: No Typology Code available for payment source | Admitting: Family Medicine

## 2021-12-18 ENCOUNTER — Telehealth: Payer: Self-pay | Admitting: *Deleted

## 2021-12-18 NOTE — Telephone Encounter (Signed)
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 I just recommend she continue with yearly pap smears and HPV testing. thanks

## 2021-12-18 NOTE — Telephone Encounter (Signed)
I called Melanie Harris and gave her results and recommendation per Dr. Ilda Basset. She voices understanding. Staci Acosta

## 2021-12-30 ENCOUNTER — Other Ambulatory Visit: Payer: Self-pay | Admitting: Family Medicine

## 2021-12-30 DIAGNOSIS — I10 Essential (primary) hypertension: Secondary | ICD-10-CM

## 2022-01-02 ENCOUNTER — Other Ambulatory Visit: Payer: Self-pay

## 2022-01-02 MED ORDER — METOPROLOL SUCCINATE ER 100 MG PO TB24
ORAL_TABLET | Freq: Every day | ORAL | 0 refills | Status: DC
  Filled 2022-01-02: qty 30, 30d supply, fill #0
  Filled 2022-01-31: qty 30, 30d supply, fill #1
  Filled 2022-03-03 – 2022-03-05 (×2): qty 30, 30d supply, fill #2

## 2022-01-02 NOTE — Telephone Encounter (Signed)
Requested Prescriptions  Pending Prescriptions Disp Refills   metoprolol succinate (TOPROL-XL) 100 MG 24 hr tablet 90 tablet 0    Sig: TAKE 1 TABLET (100 MG TOTAL) BY MOUTH DAILY.     Cardiovascular:  Beta Blockers Passed - 12/30/2021  7:21 PM      Passed - Last BP in normal range    BP Readings from Last 1 Encounters:  12/15/21 128/79         Passed - Last Heart Rate in normal range    Pulse Readings from Last 1 Encounters:  12/15/21 80         Passed - Valid encounter within last 6 months    Recent Outpatient Visits           5 months ago Annual physical exam   [place], Charlane Ferretti, MD   6 months ago Essential hypertension   [place], Enobong, MD   1 year ago Left nasal polyps   [place], Enobong, MD   1 year ago Sore in [place], [place]   1 year ago Sciatica of right side   [place], Dionne Bucy, [place]       Future Appointments             In 3 days Evans Lance, MD [place]. [facility], [facility]   In 2 months Charlott Rakes, MD [place]

## 2022-01-05 ENCOUNTER — Ambulatory Visit: Payer: Self-pay | Attending: Internal Medicine | Admitting: Internal Medicine

## 2022-01-05 ENCOUNTER — Encounter: Payer: Self-pay | Admitting: Internal Medicine

## 2022-01-05 VITALS — BP 142/72 | HR 79 | Ht 67.0 in | Wt 104.0 lb

## 2022-01-05 DIAGNOSIS — I10 Essential (primary) hypertension: Secondary | ICD-10-CM

## 2022-01-05 DIAGNOSIS — R002 Palpitations: Secondary | ICD-10-CM

## 2022-01-05 DIAGNOSIS — I471 Supraventricular tachycardia, unspecified: Secondary | ICD-10-CM

## 2022-01-05 NOTE — Patient Instructions (Addendum)
Medication Instructions:  Your physician recommends that you continue on your current medications as directed. Please refer to the Current Medication list given to you today.  *If you need a refill on your cardiac medications before your next appointment, please call your pharmacy*  Lab Work: None ordered.  If you have labs (blood work) drawn today and your tests are completely normal, you will receive your results only by: MyChart Message (if you have MyChart) OR A paper copy in the mail If you have any lab test that is abnormal or we need to change your treatment, we will call you to review the results.  Testing/Procedures: None ordered.  Follow-Up: At [facility], you and your health needs are our priority.  As part of our continuing mission to provide you with exceptional heart care, we have created designated Provider Care Teams.  These Care Teams include your primary Cardiologist (physician) and Advanced Practice Providers (APPs -  Physician Assistants and Nurse Practitioners) who all work together to provide you with the care you need, when you need it.  We recommend signing up for the patient portal called "MyChart".  Sign up information is provided on this After Visit Summary.  MyChart is used to connect with patients for Virtual Visits (Telemedicine).  Patients are able to view lab/test results, encounter notes, upcoming appointments, etc.  Non-urgent messages can be sent to your provider as well.   To learn more about what you can do with MyChart, go to [email or web address].    Your next appointment:   AS NEEDED  The format for your next appointment:   In Person  Provider:   Gregg Taylor, MD{or one of the following Advanced Practice Providers on your designated Care Team:   Renee Ursuy, PA-C Michael "Andy" Tillery, PA-C  Important Information About [place]        

## 2022-01-05 NOTE — Progress Notes (Signed)
HPI Melanie Harris returns today for followup. She is a pleasant 61 yo woman who I have not seen in over 3 years with HTN and palpitations due to PAC's and PVC's. I saw her last 3 years ago. She saw Dr. Angelena Form for a preop eval a few months ago. She wore a cardiac monitor which demonstrated very brief episodes of brief NS SVT, but mostly PAC's in couplets. Since then she notes she was diagnosed with squamous cell CA of the nose requiring a large flap. She has not had syncope.   Allergies  Allergen Reactions   Morphine And Related Nausea And Vomiting   Doxycycline Rash     Current Outpatient Medications  Medication Sig Dispense Refill   acetaminophen (TYLENOL) 500 MG tablet Take 1,000 mg by mouth every 6 (six) hours as needed for moderate pain (pain).     amLODipine (NORVASC) 10 MG tablet TAKE 1 TABLET (10 MG TOTAL) BY MOUTH DAILY. 90 tablet 1   atorvastatin (LIPITOR) 20 MG tablet TAKE 1 TABLET (20 MG TOTAL) BY MOUTH DAILY. 90 tablet 1   cholecalciferol (VITAMIN D) 25 MCG (1000 UNIT) tablet Take 1,000 Units by mouth every morning.     magnesium oxide (MAG-OX) 400 MG tablet TAKE 1 TABLET (400 MG TOTAL) BY MOUTH DAILY. 90 tablet 3   metoprolol succinate (TOPROL-XL) 100 MG 24 hr tablet TAKE 1 TABLET (100 MG TOTAL) BY MOUTH DAILY. 90 tablet 0   No current facility-administered medications for this visit.     Past Medical History:  Diagnosis Date   Abscess of right thigh 07/25/2011   Abscess of thigh    right   Anxiety state 12/21/2015   Cough 08/12/2013   Hypertension    Low grade 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 Pathology negative 06/2016: LSIL/no hpv testing done   Palpitation    Pure hypercholesterolemia 12/22/2015   Rash 08/21/2011   Smoking 08/12/2013   Xerosis of skin 02/21/2017    ROS:   All systems reviewed and negative except as noted in the  HPI.   Past Surgical History:  Procedure Laterality Date   ABDOMINAL HYSTERECTOMY  2004   COLPOSCOPY  12/12/2021   NASAL FLAP ROTATION Left 01/27/2021   Procedure: NASAL FLAP ROTATION;  Surgeon: [name], MD;  Location: [place];  Service: Plastics;  Laterality: Left;   NASAL FLAP ROTATION Left 10/10/2021   Procedure: Revision left nasal forehead flap with complex closure, possible cartilage graft to left nose;  Surgeon: Lennice Sites, MD;  Location: [place];  Service: Plastics;  Laterality: Left;   NASAL RECONSTRUCTION Left 01/27/2021   Procedure: NASAL RECONSTRUCTION;  Surgeon: [name], MD;  Location: [place];  Service: Plastics;  Laterality: Left;   NASAL RECONSTRUCTION Bilateral 02/21/2021   Procedure: DIVISION AND INSET OF NASAL FLAPS, DEBRIDEMENT RIGHT NASOLABIAL FLAP, REVISION OF INSET RIGHT NASOLABIAL FLAP;  Surgeon: [name], MD;  Location: [place];  Service: Plastics;  Laterality: Bilateral;   NASAL RECONSTRUCTION Right 03/13/2021   Procedure: Division and Inset forehead flap, with nasal reconstruction with flaps and grafts as necessary, left rib cartilage graft.;  Surgeon: [name], MD;  Location: [place];  Service: Plastics;  Laterality: Right;  1.5 hour   RHINOPLASTY Left 01/18/2021   Procedure: PARTIAL RHINECTOMY WITH FROZEN SECTION;  Surgeon: [name], MD;  Location: [facility]  OR;  Service: ENT;  Laterality: Left;   SKIN FULL THICKNESS GRAFT Left 01/27/2021   Procedure: SKIN GRAFT FULL THICKNESS;  Surgeon: [name], MD;  Location: [place];  Service: Plastics;  Laterality: Left;   TONSILLECTOMY  10-12 yrs old     Family History  Problem Relation Age of Onset   Heart failure Mother        started in her 93's   Hypertension Mother    Hyperlipidemia Mother    Lung cancer Father    Hypertension Father    Hypercholesterolemia Father    Diabetes Sister        diet controlled   Dysrhythmia Sister        possible in the past   Healthy  Daughter    Breast cancer Neg Hx      Social History   Socioeconomic History   Marital status: Divorced    Spouse name: Not on file   Number of children: 1   Years of education: 1   Highest education level: High school graduate  Occupational History   Not on file  Tobacco Use   Smoking status: Every Day    Packs/day: 0.25    Years: 30.00    Total pack years: 7.50    Types: Cigarettes   Smokeless tobacco: Never   Tobacco comments:    4 cigs per day  Vaping Use   Vaping Use: Never used  Substance and Sexual Activity   Alcohol use: Yes    Alcohol/week: 1.0 standard drink of alcohol    Types: 1 Glasses of wine per week    Comment: occassional   Drug use: No   Sexual activity: Not Currently    Birth control/protection: Surgical    Comment: hyst  Other Topics Concern   Not on file  Social History Narrative   Not on file   Social Determinants of Health   Financial Resource Strain: Medium Risk (09/28/2021)   Overall Financial Resource Strain (CARDIA)    Difficulty of Paying Living Expenses: Somewhat hard  Food Insecurity: No Food Insecurity (09/28/2021)   Hunger Vital Sign    Worried About Running Out of Food in the Last Year: Never true    Ran Out of Food in the Last Year: Never true  Transportation Needs: No Transportation Needs (09/28/2021)   PRAPARE - Hydrologist (Medical): No    Lack of Transportation (Non-Medical): No  Physical Activity: Not on file  Stress: Not on file  Social Connections: Not on file  Intimate Partner Violence: Not on file     BP (!) 142/72   Pulse 79   Ht '5\' 7"'$  (1.702 m)   Wt 104 lb (47.2 kg)   SpO2 97%   BMI 16.29 kg/m   Physical Exam:  Well appearing NAD HEENT: Unremarkable Neck:  No JVD, no thyromegally Lymphatics:  No adenopathy Back:  No CVA tenderness Lungs:  Clear with no wheezes HEART:  Regular rate rhythm, no murmurs, no rubs, no clicks Abd:  soft, positive bowel sounds, no organomegally,  no rebound, no guarding Ext:  2 plus pulses, no edema, no cyanosis, no clubbing Skin:  No rashes no nodules Neuro:  CN II through XII intact, motor grossly intact  EKG - nsr  Assess/Plan: Palpiations with NS SVT and PAC's - she is minimally symptomatic. She will continue her current meds and undergo watchful waiting. We discussed the importance of limited caffeine and ETOH.  HTN - her  bp is up a bit. She will continue amlodipine.  Carleene Overlie Braeden Dolinski,MD

## 2022-01-09 ENCOUNTER — Telehealth: Payer: Self-pay | Admitting: *Deleted

## 2022-01-09 NOTE — Telephone Encounter (Signed)
Patient called to check on sx status. Informed her that we are not yet working on her case but once its worked and complete I will be back in touch

## 2022-01-19 ENCOUNTER — Telehealth: Payer: Self-pay | Admitting: *Deleted

## 2022-01-19 NOTE — Telephone Encounter (Signed)
Email to [facility] for CPT and message to patient about ins/ fa

## 2022-02-01 ENCOUNTER — Other Ambulatory Visit: Payer: Self-pay

## 2022-02-02 ENCOUNTER — Other Ambulatory Visit: Payer: Self-pay

## 2022-03-05 ENCOUNTER — Other Ambulatory Visit: Payer: Self-pay

## 2022-03-08 ENCOUNTER — Ambulatory Visit: Payer: Self-pay | Admitting: Family Medicine

## 2022-04-02 ENCOUNTER — Other Ambulatory Visit: Payer: Self-pay

## 2022-04-02 ENCOUNTER — Other Ambulatory Visit: Payer: Self-pay | Admitting: Family Medicine

## 2022-04-02 DIAGNOSIS — I10 Essential (primary) hypertension: Secondary | ICD-10-CM

## 2022-04-02 MED ORDER — METOPROLOL SUCCINATE ER 100 MG PO TB24
100.0000 mg | ORAL_TABLET | Freq: Every day | ORAL | 0 refills | Status: DC
  Filled 2022-04-02: qty 90, 90d supply, fill #0

## 2022-04-05 ENCOUNTER — Other Ambulatory Visit: Payer: Self-pay

## 2022-04-25 ENCOUNTER — Other Ambulatory Visit: Payer: Self-pay | Admitting: Family Medicine

## 2022-04-25 DIAGNOSIS — I10 Essential (primary) hypertension: Secondary | ICD-10-CM

## 2022-04-25 DIAGNOSIS — E7800 Pure hypercholesterolemia, unspecified: Secondary | ICD-10-CM

## 2022-05-28 ENCOUNTER — Encounter: Payer: Self-pay | Admitting: Family Medicine

## 2022-05-28 ENCOUNTER — Other Ambulatory Visit: Payer: Self-pay

## 2022-05-28 ENCOUNTER — Ambulatory Visit: Payer: Self-pay | Attending: Family Medicine | Admitting: Family Medicine

## 2022-05-28 VITALS — BP 132/74 | HR 69 | Wt 108.2 lb

## 2022-05-28 DIAGNOSIS — E7800 Pure hypercholesterolemia, unspecified: Secondary | ICD-10-CM

## 2022-05-28 DIAGNOSIS — I10 Essential (primary) hypertension: Secondary | ICD-10-CM

## 2022-05-28 DIAGNOSIS — Z1211 Encounter for screening for malignant neoplasm of colon: Secondary | ICD-10-CM

## 2022-05-28 MED ORDER — METOPROLOL SUCCINATE ER 100 MG PO TB24
100.0000 mg | ORAL_TABLET | Freq: Every day | ORAL | 1 refills | Status: DC
  Filled 2022-05-28 – 2022-06-27 (×3): qty 90, 90d supply, fill #0
  Filled 2022-09-27 – 2022-09-28 (×2): qty 90, 90d supply, fill #1

## 2022-05-28 MED ORDER — ATORVASTATIN CALCIUM 20 MG PO TABS: 20.0000 mg | ORAL_TABLET | Freq: Every day | ORAL | 1 refills | Status: DC

## 2022-05-28 MED ORDER — MAGNESIUM OXIDE 400 MG PO TABS
400.0000 mg | ORAL_TABLET | Freq: Every day | ORAL | 3 refills | Status: DC
  Filled 2022-05-28 – 2022-06-27 (×3): qty 90, 90d supply, fill #0
  Filled 2022-09-27 – 2022-09-28 (×2): qty 90, 90d supply, fill #1
  Filled 2022-12-22: qty 90, 90d supply, fill #2
  Filled 2023-03-23: qty 90, 90d supply, fill #3

## 2022-05-28 MED ORDER — AMLODIPINE BESYLATE 10 MG PO TABS: ORAL_TABLET | ORAL | 1 refills | Status: DC

## 2022-05-28 NOTE — Progress Notes (Signed)
Subjective:  Patient ID: Melanie Harris, female    DOB: [birth year]  Age: 62 y.o. MRN: [id number]  CC: Medication Refill   HPI Melanie Harris is a 62 y.o. 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   Interval History:  Visit with ENT was in 03/2022 notes reviewed which reveal no evidence of recurrent cancer with plans to follow-up in 6 months. She continues to follow-up with plastic surgery.  Endorses adherence with her antihypertensive.  Her initial blood pressure was elevated but she attributes this to switching for parking when she got here.  Also doing well on her statin. She has no additional concerns today. She quit smoking 6 months ago. Past Medical History:  Diagnosis Date   Abscess of right thigh 07/25/2011   Abscess of thigh    right   Anxiety state 12/21/2015   Cough 08/12/2013   Hypertension    Low grade squamous intraepithelial lesion (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 Pathology negative 06/2016: LSIL/no hpv testing done   Palpitation    Pure hypercholesterolemia 12/22/2015   Rash 08/21/2011   Smoking 08/12/2013   Xerosis of skin 02/21/2017    Past Surgical History:  Procedure Laterality Date   ABDOMINAL HYSTERECTOMY  2004   COLPOSCOPY  12/12/2021   NASAL FLAP ROTATION Left 01/27/2021   Procedure: NASAL FLAP ROTATION;  Surgeon: Allena Napoleon, MD;  Location: [facility];  Service: Plastics;  Laterality: Left;   NASAL FLAP ROTATION Left 10/10/2021   Procedure: Revision left nasal forehead flap with complex closure, possible cartilage graft to left nose;  Surgeon: Janne Napoleon, MD;  [facility];  Service: Plastics;  Laterality: Left;   NASAL RECONSTRUCTION Left 01/27/2021   Procedure: NASAL  RECONSTRUCTION;  Surgeon: Allena Napoleon, MD;  Location: [facility];  Service: Plastics;  Laterality: Left;   NASAL RECONSTRUCTION Bilateral 02/21/2021   Procedure: DIVISION AND INSET OF NASAL FLAPS, DEBRIDEMENT RIGHT NASOLABIAL FLAP, REVISION OF INSET RIGHT NASOLABIAL FLAP;  Surgeon: Allena Napoleon, MD;  Location: [facility];  Service: Plastics;  Laterality: Bilateral;   NASAL RECONSTRUCTION Right 03/13/2021   Procedure: Division and Inset forehead flap, with nasal reconstruction with flaps and grafts as necessary, left rib cartilage graft.;  Surgeon: Allena Napoleon, MD;  Location: [facility];  Service: Plastics;  Laterality: Right;  1.5 hour   RHINOPLASTY Left 01/18/2021   Procedure: PARTIAL RHINECTOMY WITH FROZEN SECTION;  Surgeon: Serena Colonel, MD;  Location[facility] OR;  Service: ENT;  Laterality: Left;   SKIN FULL THICKNESS GRAFT Left 01/27/2021   Procedure: SKIN GRAFT FULL THICKNESS;  Surgeon: Allena Napoleon, MD;  Location: [facility];  Service: Plastics;  Laterality: Left;   TONSILLECTOMY  10-12 yrs old    Family History  Problem Relation Age of Onset   Heart failure Mother        started in her 40's   Hypertension Mother    Hyperlipidemia Mother    Lung cancer Father    Hypertension Father    Hypercholesterolemia Father    Diabetes Sister        diet controlled   Dysrhythmia Sister        possible in the past   Healthy Daughter    Breast cancer Neg Hx  Social History   Socioeconomic History   Marital status: Divorced    Spouse name: Not on file   Number of children: 1   Years of education: 1   Highest education level: High school graduate  Occupational History   Not on file  Tobacco Use   Smoking status: Every Day    Packs/day: 0.25    Years: 30.00    Additional pack years: 0.00    Total pack years: 7.50    Types: Cigarettes   Smokeless tobacco: Former   Tobacco comments:    4 cigs per day  Vaping Use   Vaping Use: Never used  Substance and Sexual Activity   Alcohol use: Yes     Alcohol/week: 1.0 standard drink of alcohol    Types: 1 Glasses of wine per week    Comment: occassional   Drug use: No   Sexual activity: Not Currently    Birth control/protection: Surgical    Comment: hyst  Other Topics Concern   Not on file  Social History Narrative   Not on file   Social Determinants of Health   Financial Resource Strain: Medium Risk (09/28/2021)   Overall Financial Resource Strain (CARDIA)    Difficulty of Paying Living Expenses: Somewhat hard  Food Insecurity: No Food Insecurity (09/28/2021)   Hunger Vital Sign    Worried About Running Out of Food in the Last Year: Never true    Ran Out of Food in the Last Year: Never true  Transportation Needs: No Transportation Needs (09/28/2021)   PRAPARE - Administrator, Civil Service (Medical): No    Lack of Transportation (Non-Medical): No  Physical Activity: Not on file  Stress: Not on file  Social Connections: Not on file    Allergies  Allergen Reactions   Morphine And Related Nausea And Vomiting   Doxycycline Rash    Outpatient Medications Prior to Visit  Medication Sig Dispense Refill   acetaminophen (TYLENOL) 500 MG tablet Take 1,000 mg by mouth every 6 (six) hours as needed for moderate pain (pain).     cholecalciferol (VITAMIN D) 25 MCG (1000 UNIT) tablet Take 1,000 Units by mouth every morning.     amLODipine (NORVASC) 10 MG tablet TAKE 1 Tablet BY MOUTH ONCE EVERY DAY 30 tablet 0   atorvastatin (LIPITOR) 20 MG tablet TAKE 1 Tablet BY MOUTH ONCE EVERY DAY 30 tablet 0   magnesium oxide (MAG-OX) 400 MG tablet TAKE 1 TABLET (400 MG TOTAL) BY MOUTH DAILY. 90 tablet 3   metoprolol succinate (TOPROL-XL) 100 MG 24 hr tablet Take 1 tablet (100 mg total) by mouth daily. 90 tablet 0   No facility-administered medications prior to visit.     ROS Review of Systems  Constitutional:  Negative for activity change and appetite change.  HENT:  Negative for sinus pressure and sore throat.    Respiratory:  Negative for chest tightness, shortness of breath and wheezing.   Cardiovascular:  Negative for chest pain and palpitations.  Gastrointestinal:  Negative for abdominal distention, abdominal pain and constipation.  Genitourinary: Negative.   Musculoskeletal: Negative.   Psychiatric/Behavioral:  Negative for behavioral problems and dysphoric mood.     Objective:  BP 132/74   Pulse 69   Wt 108 lb 3.2 oz (49.1 kg)   SpO2 96%   BMI 16.95 kg/m      05/28/2022    4:32 PM 05/28/2022    4:13 PM 01/05/2022    3:25 PM  BP/Weight  Systolic  BP 132 144 142  Diastolic BP 74 76 72  Wt. (Lbs)  108.2 104  BMI  16.95 kg/m2 16.29 kg/m2      Physical Exam Constitutional:      Appearance: She is well-developed.  Cardiovascular:     Rate and Rhythm: Normal rate.     Heart sounds: Normal heart sounds. No murmur heard. Pulmonary:     Effort: Pulmonary effort is normal.     Breath sounds: Normal breath sounds. No wheezing or rales.  Chest:     Chest wall: No tenderness.  Abdominal:     General: Bowel sounds are normal. There is no distension.     Palpations: Abdomen is soft. There is no mass.     Tenderness: There is no abdominal tenderness.  Musculoskeletal:        General: Normal range of motion.     Right lower leg: No edema.     Left lower leg: No edema.  Neurological:     Mental Status: She is alert and oriented to person, place, and time.  Psychiatric:        Mood and Affect: Mood normal.        Latest Ref Rng & Units 02/21/2021    8:30 AM 01/16/2021    2:59 PM 12/12/2020    4:33 PM  CMP  Glucose 70 - 99 mg/dL 93  89  161   BUN 6 - 20 mg/dL Creatinine 0.44 - 1.00 mg/dL 0.96  0.45  4.09   Sodium 135 - 145 mmol/L 137  139  142   Potassium 3.5 - 5.1 mmol/L 3.9  3.7  4.2   Chloride 98 - 111 mmol/L 104  99  101   CO2 22 - 32 mmol/L Calcium 8.9 - 10.3 mg/dL 9.6  81.1  9.6   Total Protein 6.0 - 8.5 g/dL   6.9   Total Bilirubin 0.0 - 1.2  mg/dL   0.2   Alkaline Phos 44 - 121 IU/L   107   AST 0 - 40 IU/L   23   ALT 0 - 32 IU/L   25     Lipid Panel     Component Value Date/Time   CHOL 177 06/09/2020 1625   TRIG 101 06/09/2020 1625   HDL 98 06/09/2020 1625   CHOLHDL 1.8 06/09/2020 1625   CHOLHDL 1.7 12/21/2015 1230   VLDL 14 12/21/2015 1230   LDLCALC 61 06/09/2020 1625    CBC    Component Value Date/Time   WBC 10.5 02/21/2021 0830   RBC 4.22 02/21/2021 0830   HGB 13.9 02/21/2021 0830   HGB 12.5 06/09/2020 1625   HCT 41.4 02/21/2021 0830   HCT 37.1 06/09/2020 1625   PLT 255 02/21/2021 0830   PLT 256 06/09/2020 1625   MCV 98.1 02/21/2021 0830   MCV 97 06/09/2020 1625   MCH 32.9 02/21/2021 0830   MCHC 33.6 02/21/2021 0830   RDW 13.0 02/21/2021 0830   RDW 12.3 06/09/2020 1625   LYMPHSABS 2.3 06/09/2020 1625   MONOABS 0.7 10/30/2016 1222   EOSABS 0.1 06/09/2020 1625   BASOSABS 0.1 06/09/2020 1625    Lab Results  Component Value Date   HGBA1C 5.3 04/20/2013    Assessment & Plan:  1. Essential hypertension Controlled Continue current regimen Counseled on blood pressure goal of less than 130/80, low-sodium, DASH diet, medication compliance, 150 minutes of moderate intensity exercise per  week. Discussed medication compliance, adverse effects. - CMP14+EGFR - CBC with Differential/Platelet - amLODipine (NORVASC) 10 MG tablet; TAKE 1 Tablet BY MOUTH ONCE EVERY DAY  Dispense: 90 tablet; Refill: 1 - metoprolol succinate (TOPROL-XL) 100 MG 24 hr tablet; Take 1 tablet (100 mg total) by mouth daily.  Dispense: 90 tablet; Refill: 1  2. Pure hypercholesterolemia Controlled She is due for lipid panel but would like to defer this to another visit Low-cholesterol diet - atorvastatin (LIPITOR) 20 MG tablet; Take 1 tablet (20 mg total) by mouth daily.  Dispense: 90 tablet; Refill: 1  3. Screening for colon cancer - Fecal occult blood, imunochemical([facility])    Meds ordered this encounter  Medications    amLODipine (NORVASC) 10 MG tablet    Sig: TAKE 1 Tablet BY MOUTH ONCE EVERY DAY    Dispense:  90 tablet    Refill:  1   atorvastatin (LIPITOR) 20 MG tablet    Sig: Take 1 tablet (20 mg total) by mouth daily.    Dispense:  90 tablet    Refill:  1    Please keep upcoming appt for more refills.   magnesium oxide (MAG-OX) 400 MG tablet    Sig: Take 1 tablet (400 mg total) by mouth daily.    Dispense:  90 tablet    Refill:  3   metoprolol succinate (TOPROL-XL) 100 MG 24 hr tablet    Sig: Take 1 tablet (100 mg total) by mouth daily.    Dispense:  90 tablet    Refill:  1    Follow-up: Return in about 6 months (around 11/27/2022) for Chronic medical conditions.       Hoy Register, MD, FAAFP. [facility] [place], [place] [phone]   05/28/2022, 5:26 PM

## 2022-05-28 NOTE — Patient Instructions (Signed)
Managing Your Hypertension Hypertension, also called high blood pressure, is when the force of the blood pressing against the walls of the arteries is too strong. Arteries are blood vessels that carry blood from your heart throughout your body. Hypertension forces the heart to work harder to pump blood and may cause the arteries to become narrow or stiff. Understanding blood pressure readings A blood pressure reading includes a higher number over a lower number: The first, or top, number is called the systolic pressure. It is a measure of the pressure in your arteries as your heart beats. The second, or bottom number, is called the diastolic pressure. It is a measure of the pressure in your arteries as the heart relaxes. For most people, a normal blood pressure is below 120/80. Your personal target blood pressure may vary depending on your medical conditions, your age, and other factors. Blood pressure is classified into four stages. Based on your blood pressure reading, your health care provider may use the following stages to determine what type of treatment you need, if any. Systolic pressure and diastolic pressure are measured in a unit called millimeters of mercury (mmHg). Normal Systolic pressure: below 120. Diastolic pressure: below 80. Elevated Systolic pressure: 120-129. Diastolic pressure: below 80. Hypertension stage 1 Systolic pressure: 130-139. Diastolic pressure: 80-89. Hypertension stage 2 Systolic pressure: 140 or above. Diastolic pressure: 90 or above. How can this condition affect me? Managing your hypertension is very important. Over time, hypertension can damage the arteries and decrease blood flow to parts of the body, including the brain, heart, and kidneys. Having untreated or uncontrolled hypertension can lead to: A heart attack. A stroke. A weakened blood vessel (aneurysm). Heart failure. Kidney damage. Eye damage. Memory and concentration problems. Vascular  dementia. What actions can I take to manage this condition? Hypertension can be managed by making lifestyle changes and possibly by taking medicines. Your health care provider will help you make a plan to bring your blood pressure within a normal range. You may be referred for counseling on a healthy diet and physical activity. Nutrition  Eat a diet that is high in fiber and potassium, and low in salt (sodium), added sugar, and fat. An example eating plan is called the DASH diet. DASH stands for Dietary Approaches to Stop Hypertension. To eat this way: Eat plenty of fresh fruits and vegetables. Try to fill one-half of your plate at each meal with fruits and vegetables. Eat whole grains, such as whole-wheat pasta, brown rice, or whole-grain bread. Fill about one-fourth of your plate with whole grains. Eat low-fat dairy products. Avoid fatty cuts of meat, processed or cured meats, and poultry with skin. Fill about one-fourth of your plate with lean proteins such as fish, chicken without skin, beans, eggs, and tofu. Avoid pre-made and processed foods. These tend to be higher in sodium, added sugar, and fat. Reduce your daily sodium intake. Many people with hypertension should eat less than 1,500 mg of sodium a day. Lifestyle  Work with your health care provider to maintain a healthy body weight or to lose weight. Ask what an ideal weight is for you. Get at least 30 minutes of exercise that causes your heart to beat faster (aerobic exercise) most days of the week. Activities may include walking, swimming, or biking. Include exercise to strengthen your muscles (resistance exercise), such as weight lifting, as part of your weekly exercise routine. Try to do these types of exercises for 30 minutes at least 3 days a week. Do   not use any products that contain nicotine or tobacco. These products include cigarettes, chewing tobacco, and vaping devices, such as e-cigarettes. If you need help quitting, ask your  health care provider. Control any long-term (chronic) conditions you have, such as high cholesterol or diabetes. Identify your sources of stress and find ways to manage stress. This may include meditation, deep breathing, or making time for fun activities. Alcohol use Do not drink alcohol if: Your health care provider tells you not to drink. You are pregnant, may be pregnant, or are planning to become pregnant. If you drink alcohol: Limit how much you have to: 0-1 drink a day for women. 0-2 drinks a day for men. Know how much alcohol is in your drink. In the [place] one drink equals one 12 oz bottle of beer (355 mL), one 5 oz glass of wine (148 mL), or one 1 oz glass of hard liquor (44 mL). Medicines Your health care provider may prescribe medicine if lifestyle changes are not enough to get your blood pressure under control and if: Your systolic blood pressure is 130 or higher. Your diastolic blood pressure is 80 or higher. Take medicines only as told by your health care provider. Follow the directions carefully. Blood pressure medicines must be taken as told by your health care provider. The medicine does not work as well when you skip doses. Skipping doses also puts you at risk for problems. Monitoring Before you monitor your blood pressure: Do not smoke, drink caffeinated beverages, or exercise within 30 minutes before taking a measurement. Use the bathroom and empty your bladder (urinate). Sit quietly for at least 5 minutes before taking measurements. Monitor your blood pressure at home as told by your health care provider. To do this: Sit with your back straight and supported. Place your feet flat on the floor. Do not cross your legs. Support your arm on a flat surface, such as a table. Make sure your upper arm is at heart level. Each time you measure, take two or three readings one minute apart and record the results. You may also need to have your blood pressure checked regularly by  your health care provider. General information Talk with your health care provider about your diet, exercise habits, and other lifestyle factors that may be contributing to hypertension. Review all the medicines you take with your health care provider because there may be side effects or interactions. Keep all follow-up visits. Your health care provider can help you create and adjust your plan for managing your high blood pressure. Where to find more information National Heart, Lung, and Blood Institute: www.nhlbi.nih.gov American Heart Association: www.heart.org Contact a health care provider if: You think you are having a reaction to medicines you have taken. You have repeated (recurrent) headaches. You feel dizzy. You have swelling in your ankles. You have trouble with your vision. Get help right away if: You develop a severe headache or confusion. You have unusual weakness or numbness, or you feel faint. You have severe pain in your chest or abdomen. You vomit repeatedly. You have trouble breathing. These symptoms may be an emergency. Get help right away. Call 911. Do not wait to see if the symptoms will go away. Do not drive yourself to the hospital. Summary Hypertension is when the force of blood pumping through your arteries is too strong. If this condition is not controlled, it may put you at risk for serious complications. Your personal target blood pressure may vary depending on your medical conditions,   your age, and other factors. For most people, a normal blood pressure is less than 120/80. Hypertension is managed by lifestyle changes, medicines, or both. Lifestyle changes to help manage hypertension include losing weight, eating a healthy, low-sodium diet, exercising more, stopping smoking, and limiting alcohol. This information is not intended to replace advice given to you by your health care provider. Make sure you discuss any questions you have with your health care  provider. Document Revised: 10/06/2020 Document Reviewed: 10/06/2020 Elsevier Patient Education  2023 Elsevier Inc.  

## 2022-05-29 ENCOUNTER — Other Ambulatory Visit: Payer: Self-pay | Admitting: Family Medicine

## 2022-05-29 ENCOUNTER — Other Ambulatory Visit: Payer: Self-pay

## 2022-05-29 DIAGNOSIS — I10 Essential (primary) hypertension: Secondary | ICD-10-CM

## 2022-05-29 DIAGNOSIS — E7800 Pure hypercholesterolemia, unspecified: Secondary | ICD-10-CM

## 2022-05-29 LAB — CMP14+EGFR
ALT: 38 IU/L — ABNORMAL HIGH (ref 0–32)
AST: 46 IU/L — ABNORMAL HIGH (ref 0–40)
Albumin/Globulin Ratio: 1.9 (ref 1.2–2.2)
Albumin: 4.8 g/dL (ref 3.8–4.9)
Alkaline Phosphatase: 119 IU/L (ref 44–121)
BUN/Creatinine Ratio: 24 (ref 12–28)
BUN: 21 mg/dL (ref 8–27)
Bilirubin Total: 0.4 mg/dL (ref 0.0–1.2)
CO2: 20 mmol/L (ref 20–29)
Calcium: 9.8 mg/dL (ref 8.7–10.3)
Chloride: 99 mmol/L (ref 96–106)
Creatinine, Ser: 0.86 mg/dL (ref 0.57–1.00)
Globulin, Total: 2.5 g/dL (ref 1.5–4.5)
Glucose: 88 mg/dL (ref 70–99)
Potassium: 3.8 mmol/L (ref 3.5–5.2)
Sodium: 138 mmol/L (ref 134–144)
Total Protein: 7.3 g/dL (ref 6.0–8.5)
eGFR: 77 mL/min/{1.73_m2} (ref >59)

## 2022-05-29 LAB — CBC WITH DIFFERENTIAL/PLATELET
Basophils Absolute: 0.1 10*3/uL (ref 0.0–0.2)
Basos: 1 %
EOS (ABSOLUTE): 0.1 10*3/uL (ref 0.0–0.4)
Eos: 1 %
Hematocrit: 40.1 % (ref 34.0–46.6)
Hemoglobin: 13.1 g/dL (ref 11.1–15.9)
Immature Grans (Abs): 0 10*3/uL (ref 0.0–0.1)
Immature Granulocytes: 0 %
Lymphocytes Absolute: 2.1 10*3/uL (ref 0.7–3.1)
Lymphs: 26 %
MCH: 31.4 pg (ref 26.6–33.0)
MCHC: 32.7 g/dL (ref 31.5–35.7)
MCV: 96 fL (ref 79–97)
Monocytes Absolute: 0.8 10*3/uL (ref 0.1–0.9)
Monocytes: 10 %
Neutrophils Absolute: 5.1 10*3/uL (ref 1.4–7.0)
Neutrophils: 62 %
Platelets: 258 10*3/uL (ref 150–450)
RBC: 4.17 x10E6/uL (ref 3.77–5.28)
RDW: 12.8 % (ref 11.7–15.4)
WBC: 8.1 10*3/uL (ref 3.4–10.8)

## 2022-05-29 NOTE — Telephone Encounter (Signed)
Requested Prescriptions  Pending Prescriptions Disp Refills   atorvastatin (LIPITOR) 20 MG tablet [Pharmacy Med Name: atorvastatin 20 mg tablet] 30 tablet 0    Sig: TAKE 1 Tablet BY MOUTH ONCE EVERY DAY (PATIENT NEEDS OFFICE VISIT)     Cardiovascular:  Antilipid - Statins Failed - 05/29/2022  7:28 AM      Failed - Lipid Panel in normal range within the last 12 months    Cholesterol, Total  Date Value Ref Range Status  06/09/2020 177 100 - 199 mg/dL Final   LDL Chol Calc (NIH)  Date Value Ref Range Status  06/09/2020 61 0 - 99 mg/dL Final   HDL  Date Value Ref Range Status  06/09/2020 98 >39 mg/dL Final   Triglycerides  Date Value Ref Range Status  06/09/2020 101 0 - 149 mg/dL Final         Passed - Patient is not pregnant      Passed - Valid encounter within last 12 months    Recent Outpatient Visits           Yesterday Screening for colon cancer   [place] [facility] Hoy Register, MD   10 months ago Annual physical exam   [place] [facility] Hoy Register, MD   11 months ago Essential hypertension   [place] [facility] Hoy Register, MD   1 year ago Left nasal polyps   [place] [facility] Hoy Register, MD   1 year ago Sore in nose   [facility] [place], Marzella Schlein, [place]       Future Appointments             In 6 months Hoy Register, MD [facility]             amLODipine (NORVASC) 10 MG tablet [Pharmacy Med Name: amlodipine 10 mg tablet] 30 tablet 0    Sig: TAKE 1 Tablet BY MOUTH ONCE EVERY DAY (PATIENT NEEDS OFFICE VISIT)     Cardiovascular: Calcium Channel Blockers 2 Passed - 05/29/2022  7:28 AM      Passed - Last BP in normal range    BP Readings from Last 1 Encounters:  05/28/22 132/74         Passed - Last Heart Rate in normal range    Pulse Readings from Last 1  Encounters:  05/28/22 69         Passed - Valid encounter within last 6 months    Recent Outpatient Visits           Yesterday Screening for colon cancer   [place] [facility] Hoy Register, MD   10 months ago Annual physical exam   [facility] Hoy Register, MD   11 months ago Essential hypertension   [place] Hoy Register, MD   1 year ago Left nasal polyps   [place] [facility] Hoy Register, MD   1 year ago Sore in nose   [facility] [place], Marzella Schlein, [place]       Future Appointments             In 6 months Hoy Register, MD [facility]

## 2022-06-05 ENCOUNTER — Other Ambulatory Visit: Payer: Self-pay

## 2022-06-27 ENCOUNTER — Other Ambulatory Visit: Payer: Self-pay

## 2022-06-27 ENCOUNTER — Other Ambulatory Visit (HOSPITAL_COMMUNITY): Payer: Self-pay

## 2022-06-28 ENCOUNTER — Other Ambulatory Visit: Payer: Self-pay

## 2022-07-03 ENCOUNTER — Other Ambulatory Visit: Payer: Self-pay

## 2022-07-05 ENCOUNTER — Ambulatory Visit (INDEPENDENT_AMBULATORY_CARE_PROVIDER_SITE_OTHER): Payer: Self-pay | Admitting: Plastic Surgery

## 2022-07-05 ENCOUNTER — Encounter: Payer: Self-pay | Admitting: Plastic Surgery

## 2022-07-05 VITALS — BP 133/78 | HR 75

## 2022-07-05 DIAGNOSIS — Z85828 Personal history of other malignant neoplasm of skin: Secondary | ICD-10-CM

## 2022-07-05 DIAGNOSIS — M950 Acquired deformity of nose: Secondary | ICD-10-CM

## 2022-07-05 DIAGNOSIS — Z9889 Other specified postprocedural states: Secondary | ICD-10-CM

## 2022-07-05 NOTE — Progress Notes (Signed)
Melanie Harris returns today for evaluation.  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  She is still unhappy with the bulkiness of the flap.  She is requesting further debulking.  On examination the patient does indeed have more fullness on the left side than on the right.  There is crusting on the superior aspect of the flap.  Will schedule for 1 further debulking at her request.  She understands that there is always a chance that she will lose the flap and we will need to start the reconstruction over again.  Will schedule surgery at her request.

## 2022-07-31 ENCOUNTER — Ambulatory Visit (HOSPITAL_COMMUNITY)
Admission: EM | Admit: 2022-07-31 | Discharge: 2022-07-31 | Disposition: A | Discharge status: Home / Self Care | Payer: Self-pay | Attending: Family Medicine | Admitting: Family Medicine

## 2022-07-31 ENCOUNTER — Encounter (HOSPITAL_COMMUNITY): Payer: Self-pay

## 2022-07-31 DIAGNOSIS — R21 Rash and other nonspecific skin eruption: Secondary | ICD-10-CM

## 2022-07-31 DIAGNOSIS — B9689 Other specified bacterial agents as the cause of diseases classified elsewhere: Secondary | ICD-10-CM

## 2022-07-31 DIAGNOSIS — L089 Local infection of the skin and subcutaneous tissue, unspecified: Secondary | ICD-10-CM

## 2022-07-31 MED ORDER — AMOXICILLIN-POT CLAVULANATE 875-125 MG PO TABS
1.0000 | ORAL_TABLET | Freq: Two times a day (BID) | ORAL | 0 refills | Status: DC
Start: 2022-07-31 — End: 2022-08-15

## 2022-07-31 MED ORDER — PREDNISONE 20 MG PO TABS: 40.0000 mg | ORAL_TABLET | Freq: Every day | ORAL | 0 refills | Status: DC

## 2022-07-31 NOTE — ED Triage Notes (Signed)
Pt is here with a 10-day rash that started on her lower back. The rash has spread to her face and hands. Pt reports some itching.

## 2022-08-01 NOTE — ED Provider Notes (Signed)
[facility]   [id number] 07/31/22 Arrival Time: 1832  ASSESSMENT & PLAN:  1. Rash and nonspecific skin eruption   2. Localized bacterial skin infection     Meds ordered this encounter  Medications   amoxicillin-clavulanate (AUGMENTIN) 875-125 MG tablet    Sig: Take 1 tablet by mouth every 12 (twelve) hours.    Dispense:  20 tablet    Refill:  0   predniSONE (DELTASONE) 20 MG tablet    Sig: Take 2 tablets (40 mg total) by mouth daily.    Dispense:  10 tablet    Refill:  0    Will follow up with PCP or here if worsening or failing to improve as anticipated. Reviewed expectations re: course of current medical issues. Questions answered. Outlined signs and symptoms indicating need for more acute intervention. Patient verbalized understanding. After Visit Summary given.   SUBJECTIVE:  Melanie Harris is a 62 y.o. female who presents with a skin complaint. Pt is here with a 10-day rash that started on her lower back. The rash has spread to her face and hands. Mostly facial now. With yellowish crusting noted on nose. Prev facial surgeries for skin cancer. Pt reports some itching.  Denies fever.  OBJECTIVE: Vitals:   07/31/22 1924  BP: 135/71  Pulse: 80  Resp: 16  Temp: 98.2 F (36.8 C)  TempSrc: Oral  SpO2: 96%    General appearance: alert; no distress HEENT: [place]; AT Neck: supple with FROM Extremities: no edema; moves all extremities normally Skin: warm and dry; splotchy mildly erythematous rash over face; honey-colored crusting around bridge of nose Psychological: alert and cooperative; normal mood and affect  Allergies  Allergen Reactions   Morphine And Codeine Nausea And Vomiting   Doxycycline Rash    Past Medical History:  Diagnosis Date   Abscess of right thigh 07/25/2011   Abscess of thigh    right   Anxiety state 12/21/2015   Cough 08/12/2013   Hypertension    Low grade squamous intraepithelial lesion (LGSIL) on cervical Pap smear  07/03/2016   [ ]  rpt cytology and HPV testing 06/2017  2002: [facility] for benign indications. Pathology negative 06/2016: LSIL/no hpv testing done   Palpitation    Pure hypercholesterolemia 12/22/2015   Rash 08/21/2011   Smoking 08/12/2013   Xerosis of skin 02/21/2017   Social History   Socioeconomic History   Marital status: Divorced    Spouse name: Not on file   Number of children: 1   Years of education: 1   Highest education level: High school graduate  Occupational History   Not on file  Tobacco Use   Smoking status: Former    Packs/day: 0.25    Years: 30.00    Additional pack years: 0.00    Total pack years: 7.50    Types: Cigarettes   Smokeless tobacco: Former   Tobacco comments:    4 cigs per day  Vaping Use   Vaping Use: Never used  Substance and Sexual Activity   Alcohol use: Yes    Alcohol/week: 1.0 standard drink of alcohol    Types: 1 Glasses of wine per week    Comment: occassional   Drug use: No   Sexual activity: Not Currently    Birth control/protection: Surgical    Comment: hyst  Other Topics Concern   Not on file  Social History Narrative   Not on file   Social Determinants of Health   Financial Resource Strain: Medium Risk (09/28/2021)  Overall Financial Resource Strain (CARDIA)    Difficulty of Paying Living Expenses: Somewhat hard  Food Insecurity: No Food Insecurity (09/28/2021)   Hunger Vital Sign    Worried About Running Out of Food in the Last Year: Never true    Ran Out of Food in the Last Year: Never true  Transportation Needs: No Transportation Needs (09/28/2021)   PRAPARE - Administrator, Civil Service (Medical): No    Lack of Transportation (Non-Medical): No  Physical Activity: Not on file  Stress: Not on file  Social Connections: Not on file  Intimate Partner Violence: Not on file   Family History  Problem Relation Age of Onset   Heart failure Mother        started in her 68's   Hypertension Mother    Hyperlipidemia  Mother    Lung cancer Father    Hypertension Father    Hypercholesterolemia Father    Diabetes Sister        diet controlled   Dysrhythmia Sister        possible in the past   Healthy Daughter    Breast cancer Neg Hx    Past Surgical History:  Procedure Laterality Date   ABDOMINAL HYSTERECTOMY  2004   COLPOSCOPY  12/12/2021   NASAL FLAP ROTATION Left 01/27/2021   Procedure: NASAL FLAP ROTATION;  Surgeon: Allena Napoleon, MD;  Location: [facility];  Service: Plastics;  Laterality: Left;   NASAL FLAP ROTATION Left 10/10/2021   Procedure: Revision left nasal forehead flap with complex closure, possible cartilage graft to left nose;  Surgeon: Janne Napoleon, MD;  Location: [facility];  Service: Plastics;  Laterality: Left;   NASAL RECONSTRUCTION Left 01/27/2021   Procedure: NASAL RECONSTRUCTION;  Surgeon: Allena Napoleon, MD;  Location: [facility];  Service: Plastics;  Laterality: Left;   NASAL RECONSTRUCTION Bilateral 02/21/2021   Procedure: DIVISION AND INSET OF NASAL FLAPS, DEBRIDEMENT RIGHT NASOLABIAL FLAP, REVISION OF INSET RIGHT NASOLABIAL FLAP;  Surgeon: Allena Napoleon, MD;  Location: [facility];  Service: Plastics;  Laterality: Bilateral;   NASAL RECONSTRUCTION Right 03/13/2021   Procedure: Division and Inset forehead flap, with nasal reconstruction with flaps and grafts as necessary, left rib cartilage graft.;  Surgeon: Allena Napoleon, MD;  Location: [facility];  Service: Plastics;  Laterality: Right;  1.5 hour   RHINOPLASTY Left 01/18/2021   Procedure: PARTIAL RHINECTOMY WITH FROZEN SECTION;  Surgeon: Serena Colonel, MD;  Location[facility];  Service: ENT;  Laterality: Left;   SKIN FULL THICKNESS GRAFT Left 01/27/2021   Procedure: SKIN GRAFT FULL THICKNESS;  Surgeon: Allena Napoleon, MD;  Location: [facility];  Service: Plastics;  Laterality: Left;   TONSILLECTOMY  10-11 yrs old      Mardella Layman, MD 08/01/22 1113

## 2022-08-02 ENCOUNTER — Ambulatory Visit (INDEPENDENT_AMBULATORY_CARE_PROVIDER_SITE_OTHER): Payer: Self-pay | Admitting: Plastic Surgery

## 2022-08-02 DIAGNOSIS — Z9889 Other specified postprocedural states: Secondary | ICD-10-CM

## 2022-08-02 DIAGNOSIS — R21 Rash and other nonspecific skin eruption: Secondary | ICD-10-CM

## 2022-08-02 DIAGNOSIS — J340 Abscess, furuncle and carbuncle of nose: Secondary | ICD-10-CM

## 2022-08-02 NOTE — Progress Notes (Signed)
Melanie Harris returns today with complaints of an erythematous rash on her face and drainage from her nose.  She has previously undergone a forehead flap for nasal reconstruction.  The flap has revealed acquired several revisions.  She remains unhappy with the appearance of her nose.  She is currently scheduled for revision of her flap in mid July.  On examination she has a rash across the malar region on the forehead.  The flap appears unchanged.  I was able to examine inside the nose using a Q-tip to spread the nare and I was unable to express anything from the flap.  She is currently on Augmentin and prednisone.  I am unsure what the etiology of any of her problems are at this time.  I am concerned that there may still be ongoing infection where she had a previous rib graft which was lost.  I am going to obtain a CT scan of her face to evaluate the support structures for her nose.  Will follow-up after the studies if there are any findings

## 2022-08-08 ENCOUNTER — Encounter: Payer: Self-pay | Admitting: Surgical

## 2022-08-08 ENCOUNTER — Ambulatory Visit (INDEPENDENT_AMBULATORY_CARE_PROVIDER_SITE_OTHER): Payer: Self-pay | Admitting: Surgical

## 2022-08-08 ENCOUNTER — Other Ambulatory Visit: Payer: Self-pay

## 2022-08-08 VITALS — BP 146/73 | HR 65 | Ht 67.0 in | Wt 110.0 lb

## 2022-08-08 DIAGNOSIS — D049 Carcinoma in situ of skin, unspecified: Secondary | ICD-10-CM

## 2022-08-08 DIAGNOSIS — Z9889 Other specified postprocedural states: Secondary | ICD-10-CM

## 2022-08-08 MED ORDER — OXYCODONE HCL 5 MG PO TABS
5.0000 mg | ORAL_TABLET | Freq: Four times a day (QID) | ORAL | 0 refills | Status: AC | PRN
  Filled 2022-08-08: qty 20, 5d supply, fill #0

## 2022-08-08 NOTE — Progress Notes (Signed)
Patient ID: Melanie Harris, female    DOB: [birth year], 61 y.o.   MRN: [id number]  Chief Complaint  Patient presents with   Pre-op Exam    No diagnosis found.   History of Present Illness: Melanie Harris is a 62 y.o.  female  with a history of significant Mohs excision of her nose with subsequent forehead flap, bilateral nasolabial flap, rib cartilage graft.  She presents for preoperative evaluation for upcoming procedure, revision of left nasal reconstruction, scheduled for 08/24/2022 with Dr. Ladona Ridgel.  The patient has not had problems with anesthesia. No history of DVT/PE.  No family history of DVT/PE.  No family or personal history of bleeding or clotting disorders.  Patient is not currently taking any blood thinners.  No history of CVA/MI.   Patient has a history of smoking, reports she quit 8 months ago.  PMH Significant for: Hypertension, Mohs defect of nose with forehead flap and bilateral nasolabial flap, rib cartilage graft.  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  Patient reports that she was recently on Augmentin and prednisone, prescribed by urgent care due to concern over rash over her body, particularly her forehead and bilateral flanks.  She reports she completed prednisone and Augmentin recently.  She reports she is feeling much better now.  She is not having any infectious symptoms.  She denies any active cardiac or pulmonary disease.  She spoke with Dr. Ladona Ridgel about a CT scan prior to surgery to evaluate reconstruction, she is not scheduled for this yet.  Patient reports a history of occasional palpitations, reports she has been evaluated by cardiology related to this.  History of known SVT, PACs and PVCs.  She reports she is not having any cardiac symptoms today, at her last appoint with cardiology discussed benign nature of her palpitations, discussed adequate hydration, exercise avoiding stimulants.   Past Medical  History: Allergies: Allergies  Allergen Reactions   Morphine And Codeine Nausea And Vomiting   Doxycycline Rash    Current Medications:  Current Outpatient Medications:    acetaminophen (TYLENOL) 500 MG tablet, Take 1,000 mg by mouth every 6 (six) hours as needed for moderate pain (pain)., Disp: , Rfl:    amLODipine (NORVASC) 10 MG tablet, TAKE 1 Tablet BY MOUTH ONCE EVERY DAY, Disp: 90 tablet, Rfl: 1   amoxicillin-clavulanate (AUGMENTIN) 875-125 MG tablet, Take 1 tablet by mouth every 12 (twelve) hours., Disp: 20 tablet, Rfl: 0   atorvastatin (LIPITOR) 20 MG tablet, Take 1 tablet (20 mg total) by mouth daily., Disp: 90 tablet, Rfl: 1   cholecalciferol (VITAMIN D) 25 MCG (1000 UNIT) tablet, Take 1,000 Units by mouth every morning., Disp: , Rfl:    magnesium oxide (MAG-OX) 400 MG tablet, Take 1 tablet (400 mg total) by mouth daily., Disp: 90 tablet, Rfl: 3   metoprolol succinate (TOPROL-XL) 100 MG 24 hr tablet, Take 1 tablet (100 mg total) by mouth daily., Disp: 90 tablet, Rfl: 1   predniSONE (DELTASONE) 20 MG tablet, Take 2 tablets (40 mg total) by mouth daily., Disp: 10 tablet, Rfl: 0  Past Medical Problems: Past Medical History:  Diagnosis Date   Abscess of right thigh 07/25/2011   Abscess of thigh    right   Anxiety state 12/21/2015   Cough 08/12/2013   Hypertension    Low grade squamous intraepithelial lesion (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  Pathology negative 06/2016: LSIL/no hpv testing done   Palpitation    Pure hypercholesterolemia 12/22/2015   Rash 08/21/2011   Smoking 08/12/2013   Xerosis of skin 02/21/2017    Past Surgical History: Past Surgical History:  Procedure Laterality Date   ABDOMINAL HYSTERECTOMY  2004   COLPOSCOPY  12/12/2021   NASAL FLAP ROTATION Left 01/27/2021   Procedure: NASAL FLAP ROTATION;  Surgeon: Allena Napoleon, MD;  Location: [facility] OR;  Service: Plastics;   Laterality: Left;   NASAL FLAP ROTATION Left 10/10/2021   Procedure: Revision left nasal forehead flap with complex closure, possible cartilage graft to left nose;  Surgeon: Janne Napoleon, MD;  Location: [facility];  Service: Plastics;  Laterality: Left;   NASAL RECONSTRUCTION Left 01/27/2021   Procedure: NASAL RECONSTRUCTION;  Surgeon: Allena Napoleon, MD;  Location: [facility] OR;  Service: Plastics;  Laterality: Left;   NASAL RECONSTRUCTION Bilateral 02/21/2021   Procedure: DIVISION AND INSET OF NASAL FLAPS, DEBRIDEMENT RIGHT NASOLABIAL FLAP, REVISION OF INSET RIGHT NASOLABIAL FLAP;  Surgeon: Allena Napoleon, MD;  Location: [facility] OR;  Service: Plastics;  Laterality: Bilateral;   NASAL RECONSTRUCTION Right 03/13/2021   Procedure: Division and Inset forehead flap, with nasal reconstruction with flaps and grafts as necessary, left rib cartilage graft.;  Surgeon: Allena Napoleon, MD;  Location: [facility] OR;  Service: Plastics;  Laterality: Right;  1.5 hour   RHINOPLASTY Left 01/18/2021   Procedure: PARTIAL RHINECTOMY WITH FROZEN SECTION;  Surgeon: Serena Colonel, MD;  Location[facility];  Service: ENT;  Laterality: Left;   SKIN FULL THICKNESS GRAFT Left 01/27/2021   Procedure: SKIN GRAFT FULL THICKNESS;  Surgeon: Allena Napoleon, MD;  Location: [facility] OR;  Service: Plastics;  Laterality: Left;   TONSILLECTOMY  10-11 yrs old    Social History: Social History   Socioeconomic History   Marital status: Divorced    Spouse name: Not on file   Number of children: 1   Years of education: 1   Highest education level: High school graduate  Occupational History   Not on file  Tobacco Use   Smoking status: Former    Packs/day: 0.25    Years: 30.00    Additional pack years: 0.00    Total pack years: 7.50    Types: Cigarettes   Smokeless tobacco: Former   Tobacco comments:    4 cigs per day  Vaping Use   Vaping Use: Never used  Substance and Sexual Activity   Alcohol use: Yes    Alcohol/week: 1.0  standard drink of alcohol    Types: 1 Glasses of wine per week    Comment: occassional   Drug use: No   Sexual activity: Not Currently    Birth control/protection: Surgical    Comment: hyst  Other Topics Concern   Not on file  Social History Narrative   Not on file   Social Determinants of Health   Financial Resource Strain: Medium Risk (09/28/2021)   Overall Financial Resource Strain (CARDIA)    Difficulty of Paying Living Expenses: Somewhat hard  Food Insecurity: No Food Insecurity (09/28/2021)   Hunger Vital Sign    Worried About Running Out of Food in the Last Year: Never true    Ran Out of Food in the Last Year: Never true  Transportation Needs: No Transportation Needs (09/28/2021)   PRAPARE - Administrator, Civil Service (Medical): No    Lack of Transportation (Non-Medical): No  Physical Activity: Not on file  Stress: Not on file  Social Connections: Not on file  Intimate Partner Violence: Not on file    Family History: Family History  Problem Relation Age of Onset   Heart failure Mother        started in her 66's   Hypertension Mother    Hyperlipidemia Mother    Lung cancer Father    Hypertension Father    Hypercholesterolemia Father    Diabetes Sister        diet controlled   Dysrhythmia Sister        possible in the past   Healthy Daughter    Breast cancer Neg Hx     Review of Systems: Review of Systems  Constitutional: Negative.   Respiratory: Negative.    Cardiovascular: Negative.   Gastrointestinal: Negative.   Neurological: Negative.     Physical Exam: Vital Signs BP (!) 146/73 (BP Location: Left Arm, Patient Position: Sitting, Cuff Size: Normal)   Pulse 65   Ht 5\' 7"  (1.702 m)   Wt 110 lb (49.9 kg)   SpO2 99%   BMI 17.23 kg/m   Physical Exam  Constitutional:      General: Not in acute distress.    Appearance: Normal appearance. Not ill-appearing.  HENT:     Head: Normocephalic and atraumatic.  Eyes:     Pupils: Pupils  are equal, round Neck:     Musculoskeletal: Normal range of motion.  Cardiovascular:     Rate and Rhythm: Normal rate    Pulses: Normal pulses.  Pulmonary:     Effort: Pulmonary effort is normal. No respiratory distress.  Abdominal:     General: Abdomen is flat. There is no distension.  Musculoskeletal: Normal range of motion.  Skin:    General: Skin is warm and dry.     Findings: No erythema or rash.  Neurological:     General: No focal deficit present.     Mental Status: Alert and oriented to person, place, and time. Mental status is at baseline.     Motor: No weakness.  Psychiatric:        Mood and Affect: Mood normal.        Behavior: Behavior normal.    Assessment/Plan: The patient is scheduled for revision of left nasal reconstruction with Dr. Ladona Ridgel.  Risks, benefits, and alternatives of procedure discussed, questions answered and consent obtained.    Smoking Status: Quit 8 months ago; Counseling Given?  N/A  Caprini Score: 3, moderate; Risk Factors include: age, and length of planned surgery. Recommendation for mechanical  prophylaxis. Encourage early ambulation.   Pictures obtained: Pictures were obtained of the patient and placed in the chart with the patient's or guardian's permission.   Post-op Rx sent to pharmacy:  Oxycodone  Patient was provided with the General Surgical Risk consent document and Pain Medication Agreement prior to their appointment.  They had adequate time to read through the risk consent documents and Pain Medication Agreement. We also discussed them in person together during this preop appointment. All of their questions were answered to their satisfaction.  Recommended calling if they have any further questions.  Risk consent form and Pain Medication Agreement to be scanned into patient's chart.  Discussed scheduling CT prior to surgery, we were able to get that scheduled today for July 7.  She is aware that pending CT results surgery may need to  be postponed or surgical plan may need to be altered.   Electronically signed by: Kermit Balo Arieh Bogue, PA-C  08/08/2022 11:10 AM

## 2022-08-12 ENCOUNTER — Ambulatory Visit (HOSPITAL_BASED_OUTPATIENT_CLINIC_OR_DEPARTMENT_OTHER)
Admission: RE | Admit: 2022-08-12 | Discharge: 2022-08-12 | Disposition: A | Discharge status: Home / Self Care | Payer: Self-pay | Source: Ambulatory Visit | Attending: Plastic Surgery | Admitting: Plastic Surgery

## 2022-08-12 DIAGNOSIS — J340 Abscess, furuncle and carbuncle of nose: Secondary | ICD-10-CM

## 2022-08-12 DIAGNOSIS — Z9889 Other specified postprocedural states: Secondary | ICD-10-CM

## 2022-08-15 ENCOUNTER — Ambulatory Visit (INDEPENDENT_AMBULATORY_CARE_PROVIDER_SITE_OTHER): Payer: Self-pay | Admitting: Plastic Surgery

## 2022-08-15 DIAGNOSIS — Z85828 Personal history of other malignant neoplasm of skin: Secondary | ICD-10-CM

## 2022-08-15 DIAGNOSIS — D049 Carcinoma in situ of skin, unspecified: Secondary | ICD-10-CM

## 2022-08-15 DIAGNOSIS — M950 Acquired deformity of nose: Secondary | ICD-10-CM

## 2022-08-15 DIAGNOSIS — Z9889 Other specified postprocedural states: Secondary | ICD-10-CM

## 2022-08-15 NOTE — Progress Notes (Signed)
Melanie Harris returns today after CT scan of her nose.  On evaluation of the study it does not appear that she has any support left from the previously placed rib graft.  Given the complexity of her reconstruction thus far I feel that she would be better served to go to a higher level of care.  Dr. Wendall Harris has been recommended as an expert on complex nasal reconstruction.  I have discussed referral to Dr. Lendell Harris with Melanie Harris and she is in agreement with this plan.  The nasal flap was injected today with 0.2 mL of a 50-50 mix of Kenalog 40 and 1% plain lidocaine.  Done in an attempt to have a small amount of atrophy of the flap for appearance until she is seen by Melanie Harris.

## 2022-08-20 ENCOUNTER — Encounter: Payer: Self-pay | Admitting: Family Medicine

## 2022-08-20 ENCOUNTER — Other Ambulatory Visit: Payer: Self-pay | Admitting: Obstetrics and Gynecology

## 2022-08-20 DIAGNOSIS — Z1231 Encounter for screening mammogram for malignant neoplasm of breast: Secondary | ICD-10-CM

## 2022-08-24 ENCOUNTER — Ambulatory Visit (HOSPITAL_BASED_OUTPATIENT_CLINIC_OR_DEPARTMENT_OTHER): Admit: 2022-08-24 | Payer: Self-pay | Admitting: Plastic Surgery

## 2022-08-24 ENCOUNTER — Encounter (HOSPITAL_BASED_OUTPATIENT_CLINIC_OR_DEPARTMENT_OTHER): Payer: Self-pay

## 2022-08-24 SURGERY — REVISION, SCAR, FACE
Anesthesia: Choice | Site: Nose | Laterality: Left

## 2022-09-22 IMAGING — US US THYROID
1 series · 13 of 25 positions shown · non-contrast
Comparison: One hundred eighteen

03/30/2008

03/04/2008

CLINICAL DATA: Goiter

EXAM:
THYROID ULTRASOUND
TECHNIQUE: Ultrasound examination of the thyroid gland and adjacent soft
tissues was performed.

[Series 1: us thyroid · 0.06mm/px · 13 of 37 slices shown]
[im 1/37]
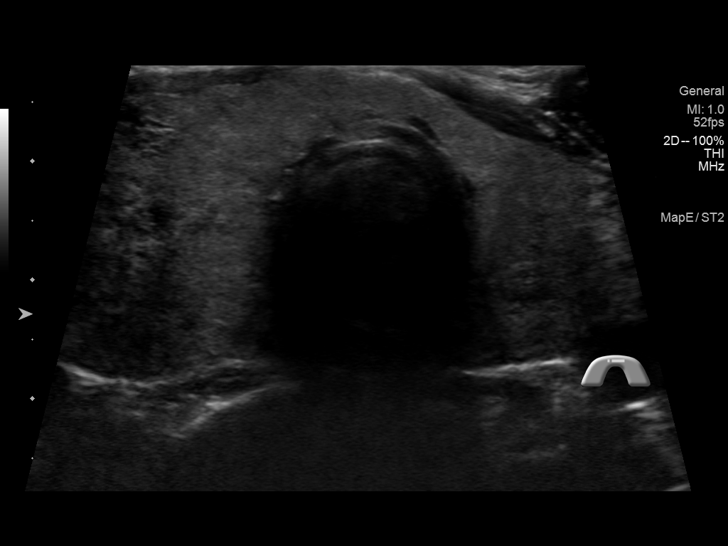
[im 4/37]
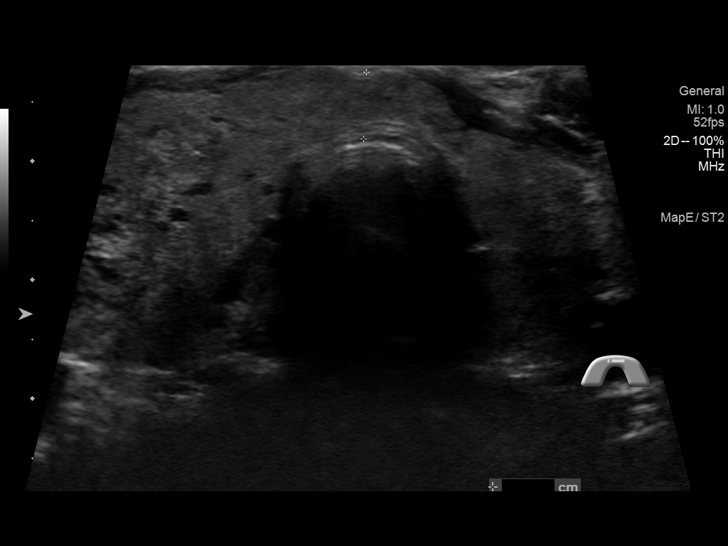
[im 7/37]
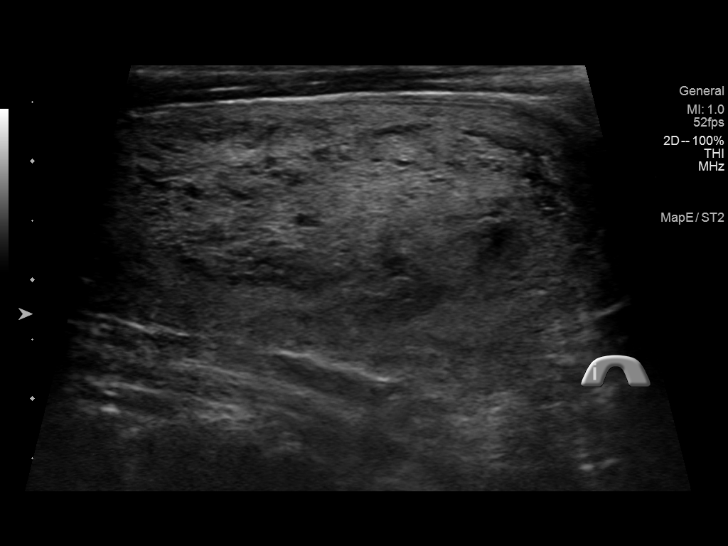
[im 10/37]
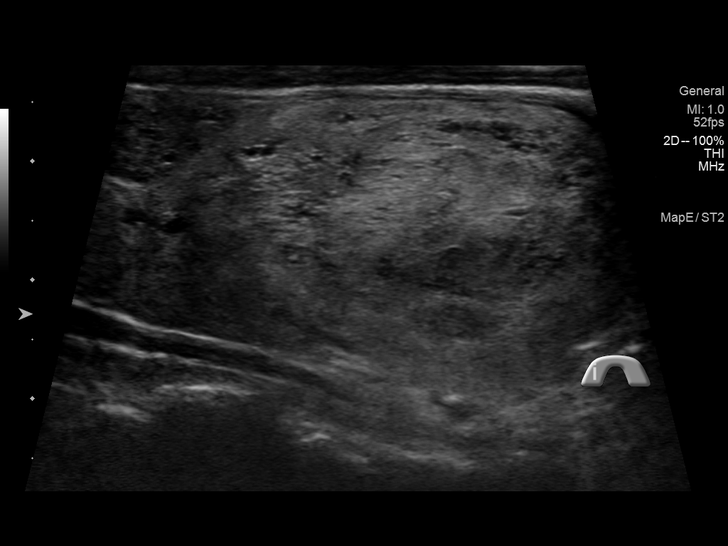
[im 13/37]
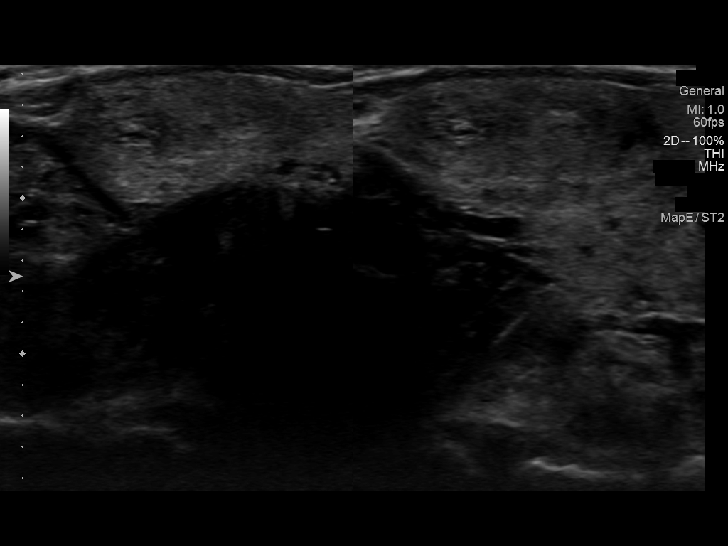
[im 16/37]
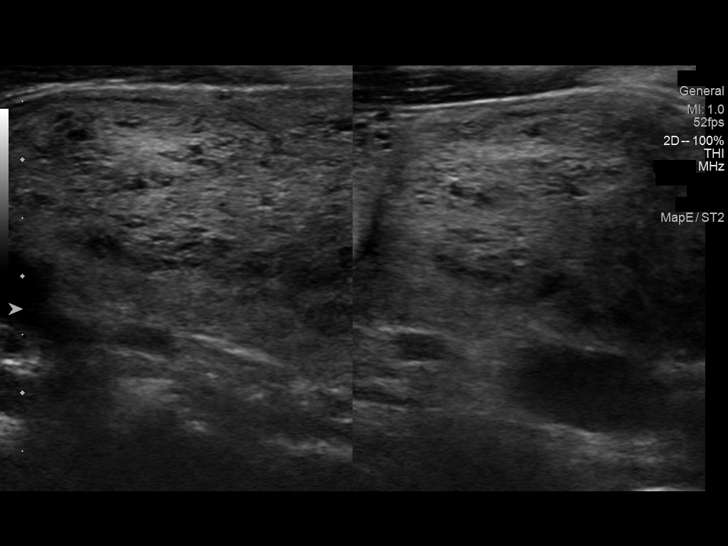
[im 19/37]
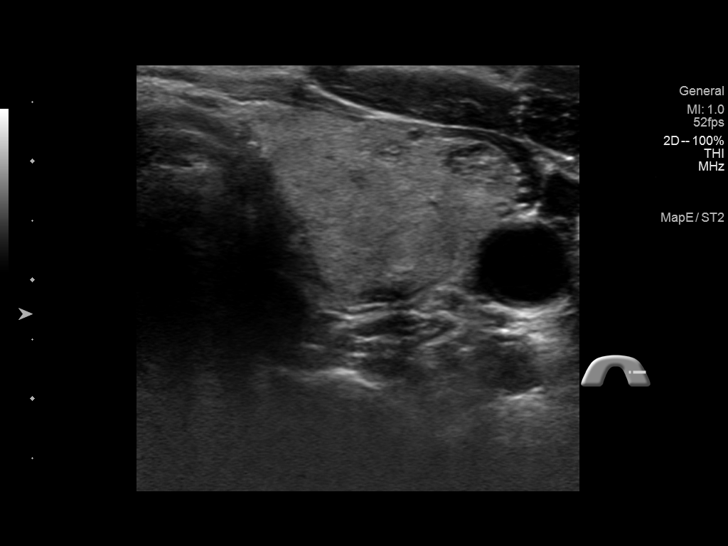
[im 22/37]
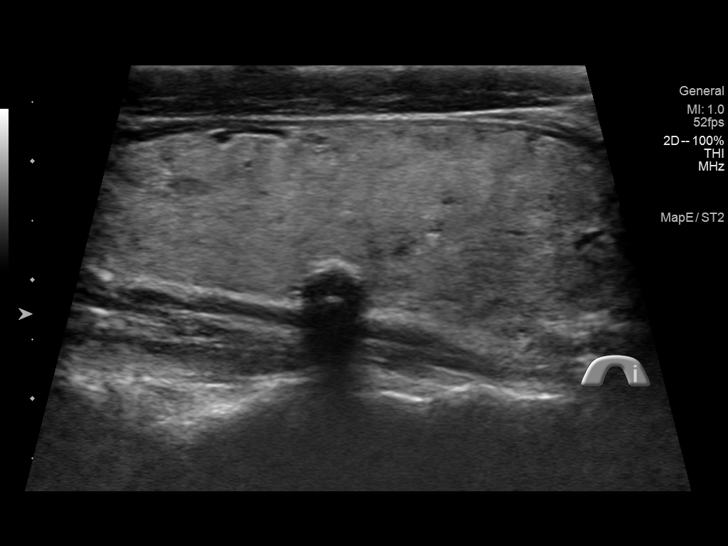
[im 25/37]
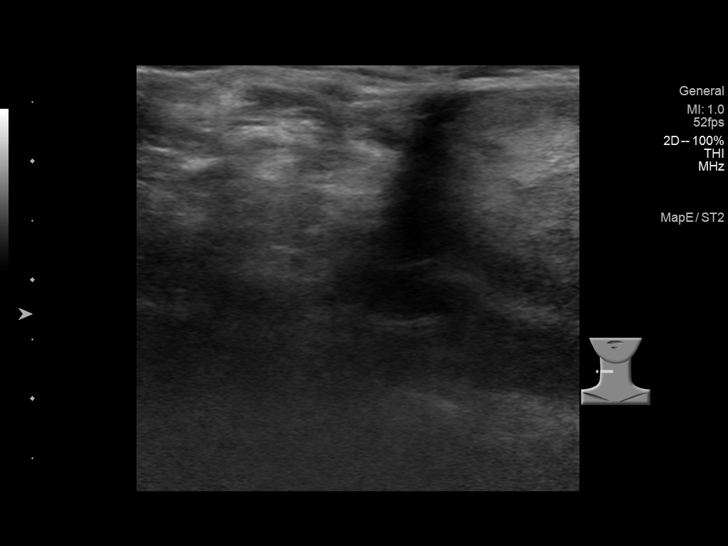
[im 28/37]
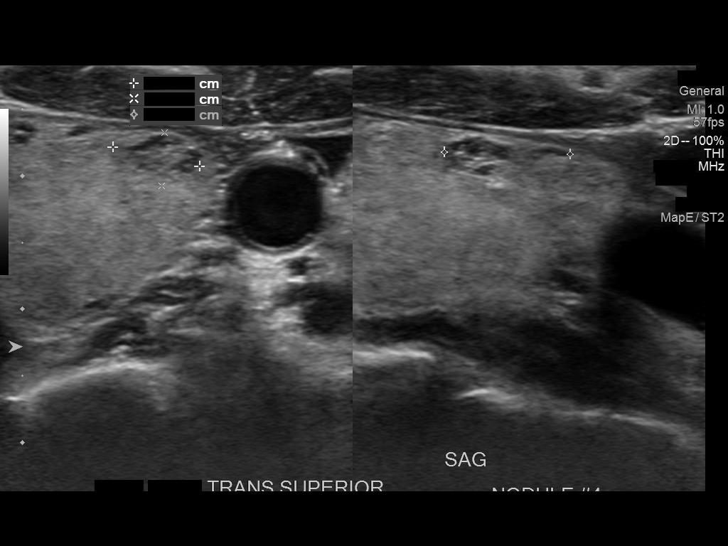
[im 31/37]
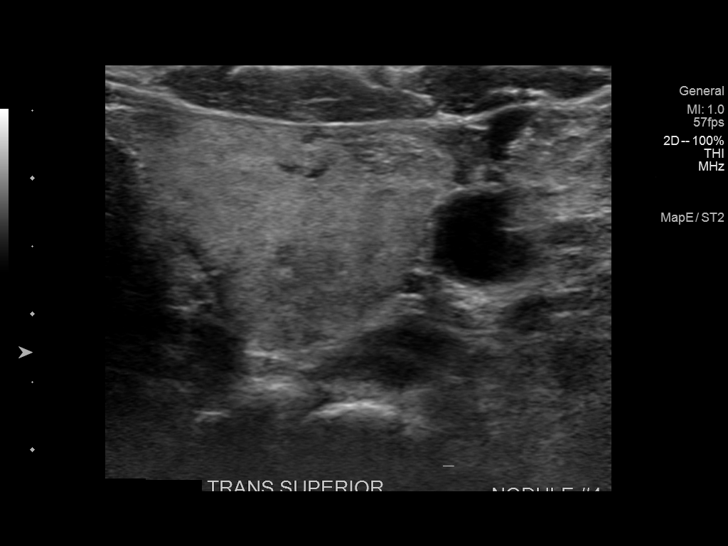
[im 34/37]
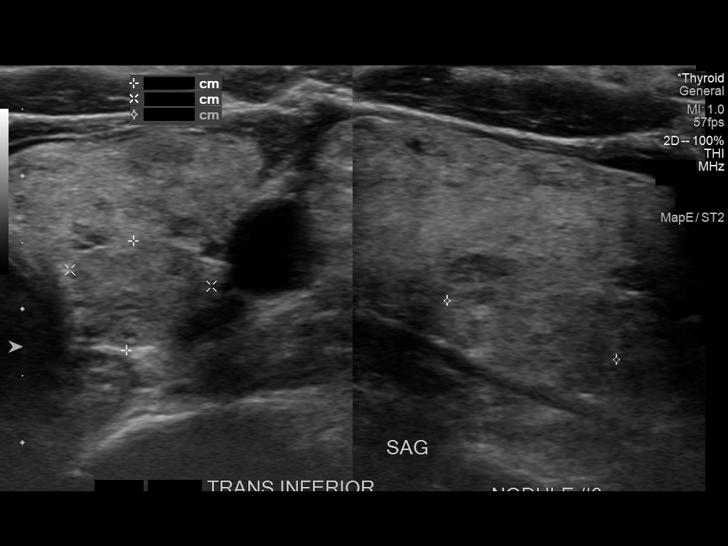
[im 37/37]
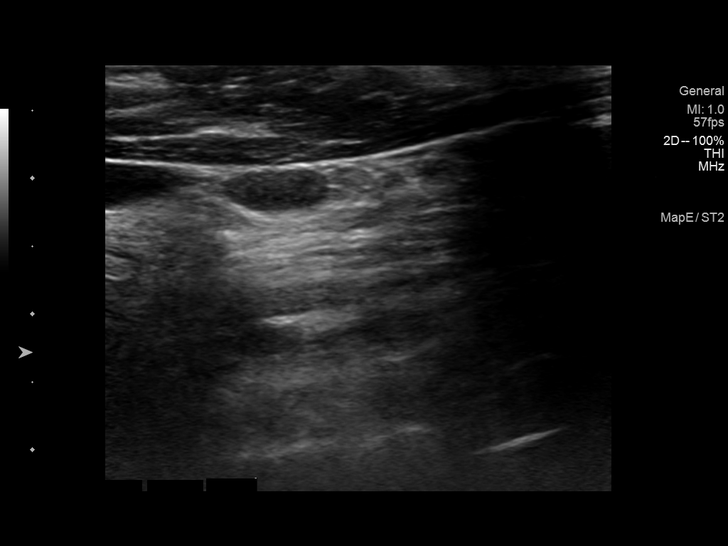

[13 of 25 positions shown; findings below may reference images not displayed]

FINDINGS: Parenchymal Echotexture: Moderately heterogeneous

Isthmus: 0.6 cm

Right lobe: 4.5 x 2.3 x 2.7 cm

Left lobe: 4.5 x 1.9 x 2.2 cm

_________________________________________________________

Estimated total number of nodules >/= 1 cm: 4

Number of spongiform nodules >/=  2 cm not described below (TR1): 0

Number of mixed cystic and solid nodules >/= 1.5 cm not described
below (TR2): 0

_________________________________________________________

Nodule 1: 0.9 x 0.6 x 0.9 cm hypoechoic solid nodule in the superior
right thyroid lobe is not significantly changed in size since
03/04/2008 where it measured 1.0 x 0.8 x 0.7 cm which is consistent
with a benign etiology. Prior FNA of this nodule was performed in
5888. Please correlate with results.

_________________________________________________________

[DATE] x 1.4 x 0.9 cm isoechoic region in the superior right thyroid
lobe is favored to be a pseudo nodule given that it lacks define
margins.

_________________________________________________________

Nodule 2: 1.6 x 1.4 x 0.9 cm spongiform nodule in the mid right
thyroid lobe does not meet criteria for imaging surveillance or FNA.

_________________________________________________________

Nodule 3: 2.0 x 2.0 x 1.4 cm left mid thyroid nodule is not
significantly changed in size since the prior exam from 5888 where
it measured 2.2 x 1.2 x 2.1 cm. This is consistent with a benign
etiology.

_________________________________________________________

Nodule 4: 0.9 x 0.4 x 0.7 cm spongiform nodule in the superior left
thyroid lobe does not meet criteria for imaging surveillance or FNA.

_________________________________________________________

Nodule 5: 0.7 x 0.4 x 0.4 cm calcified nodule in the mid left
thyroid lobe does not meet criteria for imaging surveillance or FNA.

_________________________________________________________

Nodule 6: 1.3 x 1.1 x 0.8 cm isoechoic region in the inferior left
thyroid lobe is favored to be pseudo nodule given lack of
well-defined margins.
IMPRESSION: Multiple bilateral thyroid nodules which either do not meet criteria
for FNA or imaging surveillance or are stable since 5888 which
indicates a benign etiology. No nodules meeting criteria for FNA or
imaging surveillance.

The above is in keeping with the ACR TI-RADS recommendations - [HOSPITAL] 3248;[DATE].

## 2022-09-27 ENCOUNTER — Other Ambulatory Visit (HOSPITAL_COMMUNITY): Payer: Self-pay

## 2022-09-28 ENCOUNTER — Other Ambulatory Visit: Payer: Self-pay

## 2022-10-04 ENCOUNTER — Ambulatory Visit
Admission: RE | Admit: 2022-10-04 | Discharge: 2022-10-04 | Disposition: A | Discharge status: Home / Self Care | Payer: Self-pay | Source: Ambulatory Visit | Attending: Obstetrics and Gynecology | Admitting: Obstetrics and Gynecology

## 2022-10-04 ENCOUNTER — Ambulatory Visit: Payer: Self-pay | Admitting: Hematology and Oncology

## 2022-10-04 VITALS — BP 129/67 | Wt 101.0 lb

## 2022-10-04 DIAGNOSIS — Z1231 Encounter for screening mammogram for malignant neoplasm of breast: Secondary | ICD-10-CM

## 2022-10-04 NOTE — Patient Instructions (Signed)
Taught Lelon Mast about self breast awareness and gave educational materials to take home. Patient did need a Pap smear today due to last Pap smear was in 07/25/21 per patient. She will return for screening on 10/26/2022. Let her know [facility] will cover Pap smears every 5 years unless has a history of abnormal Pap smears. Referred patient to the [facility] for screening mammogram. Appointment scheduled for 10/04/22. Patient aware of appointment and will be there. Let patient know will follow up with her within the next couple weeks with results. Lelon Mast verbalized understanding.  Pascal Lux, NP 1:46 PM

## 2022-10-04 NOTE — Progress Notes (Signed)
Melanie Harris is a 62 y.o. female who presents to [facility] clinic today with no complaints.    Pap Smear: Pap not smear completed today. Last Pap smear was 07/25/2021 and was abnormal - LSIL/ HPV+ . Per patient has history of an abnormal Pap smear. Last Pap smear result is available in Epic. 02/20/2018 - LSIL/ HPV+; 06/22/2016 - LSIL; 12/22/2021 - Vaginal biopsy showing LSIL/ VAIN 1. Will return 10/26/22 for repeat screening.    Physical exam: Breasts Breasts symmetrical. No skin abnormalities bilateral breasts. No nipple retraction bilateral breasts. No nipple discharge bilateral breasts. No lymphadenopathy. No lumps palpated bilateral breasts.  MS DIGITAL SCREENING TOMO BILATERAL  Result Date: 09/21/2021 CLINICAL DATA:  Screening. EXAM: DIGITAL SCREENING BILATERAL MAMMOGRAM WITH TOMOSYNTHESIS AND CAD TECHNIQUE: Bilateral screening digital craniocaudal and mediolateral oblique mammograms were obtained. Bilateral screening digital breast tomosynthesis was performed. The images were evaluated with computer-aided detection. COMPARISON:  Previous exam(s). ACR Breast Density Category d: The breast tissue is extremely dense, which lowers the sensitivity of mammography FINDINGS: There are no findings suspicious for malignancy. IMPRESSION: No mammographic evidence of malignancy. A result letter of this screening mammogram will be mailed directly to the patient. RECOMMENDATION: Screening mammogram in one year. (Code:SM-B-01Y) BI-RADS CATEGORY  1: Negative. Electronically Signed   By: Sande Brothers M.D.   On: 09/21/2021 08:32   MS DIGITAL SCREENING TOMO BILATERAL  Result Date: 10/05/2019 CLINICAL DATA:  Screening. EXAM: DIGITAL SCREENING BILATERAL MAMMOGRAM WITH TOMO AND CAD COMPARISON:  Previous exam(s). ACR Breast Density Category d: The breast tissue is extremely dense, which lowers the sensitivity of mammography FINDINGS: There are no findings suspicious for malignancy. Images were processed  with CAD. IMPRESSION: No mammographic evidence of malignancy. A result letter of this screening mammogram will be mailed directly to the patient. RECOMMENDATION: Screening mammogram in one year. (Code:SM-B-01Y) BI-RADS CATEGORY  1: Negative. Electronically Signed   By: Frederico Hamman M.D.   On: 10/05/2019 12:04   MM Digital Diagnostic Unilat L  Result Date: 02/26/2018 CLINICAL DATA:  62 year old patient recalled from recent screening mammogram for evaluation of possible left breast calcifications. EXAM: DIGITAL DIAGNOSTIC LEFT MAMMOGRAM WITH TOMO COMPARISON:  February 20, 2018 and earlier priors ACR Breast Density Category c: The breast tissue is heterogeneously dense, which may obscure small masses. FINDINGS: Magnification views performed of the left breast show no suspicious groupings or morphologies of microcalcifications. There is a chronic coarse calcification in the superficial superior left breast, unchanged from priors. There are a few scattered punctate calcifications. No findings to suggest malignancy. IMPRESSION: No evidence of malignancy in the left breast. RECOMMENDATION: Screening mammogram in one year.(Code:SM-B-01Y) I have discussed the findings and recommendations with the patient. Results were also provided in writing at the conclusion of the visit. If applicable, a reminder letter will be sent to the patient regarding the next appointment. BI-RADS CATEGORY  2: Benign. Electronically Signed   By: Britta Mccreedy M.D.   On: 02/26/2018 14:52   MS DIGITAL SCREENING TOMO BILATERAL  Result Date: 02/21/2018 CLINICAL DATA:  Screening. EXAM: DIGITAL SCREENING BILATERAL MAMMOGRAM WITH TOMO AND CAD COMPARISON:  Previous exam(s). ACR Breast Density Category c: The breast tissue is heterogeneously dense, which may obscure small masses. FINDINGS: In the left breast, calcifications warrant further evaluation. In the right breast, no findings suspicious for malignancy. Images were processed with CAD.  IMPRESSION: Further evaluation is suggested for calcifications in the left breast. RECOMMENDATION: Diagnostic mammogram of the left breast. (Code:FI-L-102M) The patient will be  contacted regarding the findings, and additional imaging will be scheduled. BI-RADS CATEGORY  0: Incomplete. Need additional imaging evaluation and/or prior mammograms for comparison. Electronically Signed   By: Norva Pavlov M.D.   On: 02/21/2018 10:07         Pelvic/Bimanual Pap is not indicated today    Smoking History: Patient is a former smoker having quit 15 years ago and was not referred to quit line.    Patient Navigation: Patient education provided. Access to services provided for patient through [facility] program. Melanie Harris interpreter provided. No transportation provided   Colorectal Cancer Screening: Per patient has never had colonoscopy completed No complaints today. FIT test given at Dr. Baxter Flattery office.    Breast and Cervical Cancer Risk Assessment: Patient does not have family history of breast cancer, known genetic mutations, or radiation treatment to the chest before age 26. Patient does not have history of cervical dysplasia, immunocompromised, or DES exposure in-utero.  Risk Scores as of Encounter on 10/04/2022     Melanie Harris           5-year 1.21%   Lifetime 5.85%            Last calculated by Caprice Red, CMA on 10/04/2022 at  1:31 PM        A: [facility] exam without pap smear No complaints with benign exam.   P: Referred patient to the [facility] for a screening mammogram. Appointment scheduled 10/04/22.  Melanie Lux, NP 10/04/2022 1:34 PM

## 2022-10-26 ENCOUNTER — Ambulatory Visit: Payer: Self-pay

## 2022-11-09 ENCOUNTER — Other Ambulatory Visit: Payer: Self-pay | Admitting: Family Medicine

## 2022-11-09 DIAGNOSIS — Z1211 Encounter for screening for malignant neoplasm of colon: Secondary | ICD-10-CM

## 2022-11-09 DIAGNOSIS — Z1212 Encounter for screening for malignant neoplasm of rectum: Secondary | ICD-10-CM

## 2022-11-22 ENCOUNTER — Other Ambulatory Visit: Payer: Self-pay | Admitting: Family Medicine

## 2022-11-22 DIAGNOSIS — E7800 Pure hypercholesterolemia, unspecified: Secondary | ICD-10-CM

## 2022-11-22 DIAGNOSIS — I10 Essential (primary) hypertension: Secondary | ICD-10-CM

## 2022-11-28 ENCOUNTER — Ambulatory Visit: Payer: Self-pay | Attending: Family Medicine | Admitting: Family Medicine

## 2022-11-28 ENCOUNTER — Encounter: Payer: Self-pay | Admitting: Family Medicine

## 2022-11-28 ENCOUNTER — Other Ambulatory Visit: Payer: Self-pay

## 2022-11-28 VITALS — BP 133/67 | HR 69 | Ht 67.0 in | Wt 104.8 lb

## 2022-11-28 DIAGNOSIS — Z23 Encounter for immunization: Secondary | ICD-10-CM

## 2022-11-28 DIAGNOSIS — Z1211 Encounter for screening for malignant neoplasm of colon: Secondary | ICD-10-CM

## 2022-11-28 DIAGNOSIS — Z131 Encounter for screening for diabetes mellitus: Secondary | ICD-10-CM

## 2022-11-28 DIAGNOSIS — E7800 Pure hypercholesterolemia, unspecified: Secondary | ICD-10-CM

## 2022-11-28 DIAGNOSIS — I10 Essential (primary) hypertension: Secondary | ICD-10-CM

## 2022-11-28 DIAGNOSIS — R2241 Localized swelling, mass and lump, right lower limb: Secondary | ICD-10-CM

## 2022-11-28 MED ORDER — AMLODIPINE BESYLATE 10 MG PO TABS
10.0000 mg | ORAL_TABLET | Freq: Every day | ORAL | 1 refills | Status: DC
  Filled 2022-11-28: qty 90, 90d supply, fill #0
  Filled 2022-12-26 – 2023-02-21 (×3): qty 90, 90d supply, fill #1

## 2022-11-28 MED ORDER — CEPHALEXIN 500 MG PO CAPS
500.0000 mg | ORAL_CAPSULE | Freq: Two times a day (BID) | ORAL | 0 refills | Status: DC
  Filled 2022-11-28: qty 14, 7d supply, fill #0

## 2022-11-28 MED ORDER — ATORVASTATIN CALCIUM 20 MG PO TABS
20.0000 mg | ORAL_TABLET | Freq: Every day | ORAL | 1 refills | Status: DC
  Filled 2022-11-28: qty 90, 90d supply, fill #0
  Filled 2022-12-26 – 2022-12-28 (×2): qty 90, 90d supply, fill #1

## 2022-11-28 MED ORDER — METOPROLOL SUCCINATE ER 100 MG PO TB24
100.0000 mg | ORAL_TABLET | Freq: Every day | ORAL | 1 refills | Status: DC
  Filled 2022-11-28 – 2022-12-26 (×3): qty 90, 90d supply, fill #0
  Filled 2023-03-23: qty 90, 90d supply, fill #1

## 2022-11-28 NOTE — Progress Notes (Signed)
Subjective:  Patient ID: Melanie Harris, female    DOB: 1960/12/03  Age: 62 y.o. MRN: [id number]  CC: Medical Management of Chronic Issues (Knot on back of right thigh)   HPI Melanie Harris is a 62 y.o. 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     Interval History: Discussed the use of AI scribe software for clinical note transcription with the patient, who gave verbal consent to proceed.   The patient presents with a longstanding knot on the back of her right thigh that has recently become sore and red. She denies any drainage from the area. The knot has been present for many years and has been lanced three or four times in the past.  The patient had a slightly elevated blood pressure reading today, but attributes this to rushing to the appointment. She confirms adherence to her prescribed medications, including amlodipine and metoprolol for blood pressure management, and atorvastatin for cholesterol control. The patient is also due for a cholesterol check and diabetes screening, which will be conducted today.  She is scheduled for a future nasal surgery with an ENT specialist to address a bulky nose and difficulty breathing through left nare in hot weather.  The patient is also due for colon cancer screening and has agreed to a yearly fit test due to lack of insurance coverage for a colonoscopy.        Past Medical History:  Diagnosis Date   Abscess of right thigh 07/25/2011   Abscess of thigh    right   Anxiety state 12/21/2015   Cough 08/12/2013   Hypertension    Low grade squamous intraepithelial lesion (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 Pathology negative 06/2016: LSIL/no hpv testing done   Palpitation    Pure  hypercholesterolemia 12/22/2015   Rash 08/21/2011   Smoking 08/12/2013   Xerosis of skin 02/21/2017    Past Surgical History:  Procedure Laterality Date   ABDOMINAL HYSTERECTOMY  2004   COLPOSCOPY  12/12/2021   NASAL FLAP ROTATION Left 01/27/2021   Procedure: NASAL FLAP ROTATION;  Surgeon: Allena Napoleon, MD;  Location: [facility] OR;  Service: Plastics;  Laterality: Left;   NASAL FLAP ROTATION Left 10/10/2021   Procedure: Revision left nasal forehead flap with complex closure, possible cartilage graft to left nose;  Surgeon: Janne Napoleon, MD;  Location: [facility];  Service: Plastics;  Laterality: Left;   NASAL RECONSTRUCTION Left 01/27/2021   Procedure: NASAL RECONSTRUCTION;  Surgeon: Allena Napoleon, MD;  Location: [facility] OR;  Service: Plastics;  Laterality: Left;   NASAL RECONSTRUCTION Bilateral 02/21/2021   Procedure: DIVISION AND INSET OF NASAL FLAPS, DEBRIDEMENT RIGHT NASOLABIAL FLAP, REVISION OF INSET RIGHT NASOLABIAL FLAP;  Surgeon: Allena Napoleon, MD;  Location: [facility] OR;  Service: Plastics;  Laterality: Bilateral;   NASAL RECONSTRUCTION Right 03/13/2021   Procedure: Division and Inset forehead flap, with nasal reconstruction with flaps and grafts as necessary, left rib cartilage graft.;  Surgeon: Allena Napoleon, MD;  Location: [facility];  Service: Plastics;  Laterality: Right;  1.5 hour   RHINOPLASTY Left 01/18/2021   Procedure: PARTIAL RHINECTOMY WITH FROZEN SECTION;  Surgeon: Serena Colonel, MD;  Location: [facility] OR;  Service: ENT;  Laterality: Left;   SKIN FULL THICKNESS GRAFT Left 01/27/2021  Procedure: SKIN GRAFT FULL THICKNESS;  Surgeon: Allena Napoleon, MD;  Location: [facility] OR;  Service: Plastics;  Laterality: Left;   TONSILLECTOMY  10-11 yrs old    Family History  Problem Relation Age of Onset   Heart failure Mother        started in her 53's   Hypertension Mother    Hyperlipidemia Mother    Lung cancer Father    Hypertension Father    Hypercholesterolemia Father     Diabetes Sister        diet controlled   Dysrhythmia Sister        possible in the past   Healthy Daughter    Breast cancer Neg Hx     Social History   Socioeconomic History   Marital status: Divorced    Spouse name: Not on file   Number of children: 1   Years of education: 1   Highest education level: High school graduate  Occupational History   Not on file  Tobacco Use   Smoking status: Former    Current packs/day: 0.25    Average packs/day: 0.3 packs/day for 30.0 years (7.5 ttl pk-yrs)    Types: Cigarettes   Smokeless tobacco: Former   Tobacco comments:    4 cigs per day  Vaping Use   Vaping status: Never Used  Substance and Sexual Activity   Alcohol use: Yes    Alcohol/week: 1.0 standard drink of alcohol    Types: 1 Glasses of wine per week    Comment: occassional   Drug use: No   Sexual activity: Not Currently    Birth control/protection: Surgical    Comment: Partial hysterectomy, Uterous only. Pt still has her cervix and ovaries.  Other Topics Concern   Not on file  Social History Narrative   Not on file   Social Determinants of Health   Financial Resource Strain: Low Risk  (11/28/2022)   Overall Financial Resource Strain (CARDIA)    Difficulty of Paying Living Expenses: Not very hard  Food Insecurity: No Food Insecurity (11/28/2022)   Hunger Vital Sign    Worried About Running Out of Food in the Last Year: Never true    Ran Out of Food in the Last Year: Never true  Transportation Needs: No Transportation Needs (10/04/2022)   PRAPARE - Administrator, Civil Service (Medical): No    Lack of Transportation (Non-Medical): No  Physical Activity: Inactive (11/28/2022)   Exercise Vital Sign    Days of Exercise per Week: 0 days    Minutes of Exercise per Session: 0 min  Stress: No Stress Concern Present (11/28/2022)   [employer] of Occupational Health - Occupational Stress Questionnaire    Feeling of Stress : Not at all  Social  Connections: Moderately Isolated (11/28/2022)   Social Connection and Isolation Panel [NHANES]    Frequency of Communication with Friends and Family: Three times a week    Frequency of Social Gatherings with Friends and Family: Once a week    Attends Religious Services: More than 4 times per year    Active Member of [employer] or Organizations: No    Attends Banker Meetings: Never    Marital Status: Divorced    Allergies  Allergen Reactions   Morphine And Codeine Nausea And Vomiting   Doxycycline Rash    Outpatient Medications Prior to Visit  Medication Sig Dispense Refill   cholecalciferol (VITAMIN D) 25 MCG (1000 UNIT) tablet Take 1,000 Units by mouth every  morning.     magnesium oxide (MAG-OX) 400 MG tablet Take 1 tablet (400 mg total) by mouth daily. 90 tablet 3   amLODipine (NORVASC) 10 MG tablet TAKE 1 Tablet BY MOUTH ONCE EVERY DAY 30 tablet 0   atorvastatin (LIPITOR) 20 MG tablet TAKE 1 Tablet BY MOUTH ONCE EVERY DAY 30 tablet 0   metoprolol succinate (TOPROL-XL) 100 MG 24 hr tablet Take 1 tablet (100 mg total) by mouth daily. 90 tablet 1   acetaminophen (TYLENOL) 500 MG tablet Take 1,000 mg by mouth every 6 (six) hours as needed for moderate pain (pain). (Patient not taking: Reported on 10/04/2022)     No facility-administered medications prior to visit.     ROS Review of Systems  Constitutional:  Negative for activity change and appetite change.  HENT:  Negative for sinus pressure and sore throat.   Respiratory:  Negative for chest tightness, shortness of breath and wheezing.   Cardiovascular:  Negative for chest pain and palpitations.  Gastrointestinal:  Negative for abdominal distention, abdominal pain and constipation.  Genitourinary: Negative.   Musculoskeletal: Negative.   Psychiatric/Behavioral:  Negative for behavioral problems and dysphoric mood.     Objective:  BP 133/67   Pulse 69   Ht 5\' 7"  (1.702 m)   Wt 104 lb 12.8 oz (47.5 kg)   SpO2 100%    BMI 16.41 kg/m      11/28/2022    9:00 AM 11/28/2022    8:40 AM 10/04/2022    1:27 PM  BP/Weight  Systolic BP 133 142 129  Diastolic BP 67 71 67  Wt. (Lbs)  104.8 101  BMI  16.41 kg/m2 15.82 kg/m2      Physical Exam Constitutional:      Appearance: She is well-developed.  Cardiovascular:     Rate and Rhythm: Normal rate.     Heart sounds: Normal heart sounds. No murmur heard. Pulmonary:     Effort: Pulmonary effort is normal.     Breath sounds: Normal breath sounds. No wheezing or rales.  Chest:     Chest wall: No tenderness.  Abdominal:     General: Bowel sounds are normal. There is no distension.     Palpations: Abdomen is soft. There is no mass.     Tenderness: There is no abdominal tenderness.  Musculoskeletal:        General: Normal range of motion.     Right lower leg: No edema.     Left lower leg: No edema.  Skin:    Comments: Posterior mid right thigh with induration with erythema and inferior border.  No punctum or drainage noted  Neurological:     Mental Status: She is alert and oriented to person, place, and time.  Psychiatric:        Mood and Affect: Mood normal.        Latest Ref Rng & Units 05/28/2022    4:44 PM 02/21/2021    8:30 AM 01/16/2021    2:59 PM  CMP  Glucose 70 - 99 mg/dL 88  93  89   BUN 8 - 27 mg/dL 21  13  17    Creatinine 0.57 - 1.00 mg/dL 4.25  9.56  3.87   Sodium 134 - 144 mmol/L 138  137  139   Potassium 3.5 - 5.2 mmol/L 3.8  3.9  3.7   Chloride 96 - 106 mmol/L 99  104  99   CO2 20 - 29 mmol/L 20  21  27  Calcium 8.7 - 10.3 mg/dL 9.8  9.6  16.1   Total Protein 6.0 - 8.5 g/dL 7.3     Total Bilirubin 0.0 - 1.2 mg/dL 0.4     Alkaline Phos 44 - 121 IU/L 119     AST 0 - 40 IU/L 46     ALT 0 - 32 IU/L 38       Lipid Panel     Component Value Date/Time   CHOL 177 06/09/2020 1625   TRIG 101 06/09/2020 1625   HDL 98 06/09/2020 1625   CHOLHDL 1.8 06/09/2020 1625   CHOLHDL 1.7 12/21/2015 1230   VLDL 14 12/21/2015 1230    LDLCALC 61 06/09/2020 1625    CBC    Component Value Date/Time   WBC 8.1 05/28/2022 1644   WBC 10.5 02/21/2021 0830   RBC 4.17 05/28/2022 1644   RBC 4.22 02/21/2021 0830   HGB 13.1 05/28/2022 1644   HCT 40.1 05/28/2022 1644   PLT 258 05/28/2022 1644   MCV 96 05/28/2022 1644   MCH 31.4 05/28/2022 1644   MCH 32.9 02/21/2021 0830   MCHC 32.7 05/28/2022 1644   MCHC 33.6 02/21/2021 0830   RDW 12.8 05/28/2022 1644   LYMPHSABS 2.1 05/28/2022 1644   MONOABS 0.7 10/30/2016 1222   EOSABS 0.1 05/28/2022 1644   BASOSABS 0.1 05/28/2022 1644    Lab Results  Component Value Date   HGBA1C 5.3 04/20/2013    Assessment & Plan:      Thigh Mass Chronic mass on right thigh, now with redness and tenderness. No drainage. Previously lanced multiple times. -Placed comfortably on Keflex given slight erythema -Refer to general surgery for further evaluation and possible excision.  Hypertension Initial elevated blood pressure, but repeat measurement was normal. Patient reports taking amlodipine and metoprolol as prescribed. -Continue current antihypertensive regimen.  Hyperlipidemia On atorvastatin, but recent cholesterol levels not available. -Order lipid panel today. -Continue atorvastatin and low cholesterol diet.  Diabetes Screening No recent screening. -Order A1c today.  Colon Cancer Screening Due for screening, but patient lacks insurance coverage for colonoscopy. -Provide FIT test kit for annual screening.  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 -Continue follow-up with ENT surgeon.  General Health Maintenance -Administered flu shot today. -Schedule follow-up in 6 months, or sooner if any issues arise.          Meds ordered this encounter  Medications   amLODipine (NORVASC) 10 MG tablet    Sig: TAKE 1 Tablet BY MOUTH ONCE EVERY DAY     Dispense:  90 tablet    Refill:  1   atorvastatin (LIPITOR) 20 MG tablet    Sig: Take 1 tablet (20 mg total) by mouth daily.    Dispense:  90 tablet    Refill:  1   metoprolol succinate (TOPROL-XL) 100 MG 24 hr tablet    Sig: Take 1 tablet (100 mg total) by mouth daily.    Dispense:  90 tablet    Refill:  1    Follow-up: No follow-ups on file.       Hoy Register, MD, FAAFP. [facility] [place], [place] [phone]   11/28/2022, 9:14 AM

## 2022-11-29 LAB — HEMOGLOBIN A1C
Est. average glucose Bld gHb Est-mCnc: 114 mg/dL
Hgb A1c MFr Bld: 5.6 % (ref 4.8–5.6)

## 2022-11-29 LAB — CMP14+EGFR
ALT: 20 [IU]/L (ref 0–32)
AST: 26 [IU]/L (ref 0–40)
Albumin: 4.8 g/dL (ref 3.9–4.9)
Alkaline Phosphatase: 107 [IU]/L (ref 44–121)
BUN/Creatinine Ratio: 19 (ref 12–28)
BUN: 12 mg/dL (ref 8–27)
Bilirubin Total: 0.3 mg/dL (ref 0.0–1.2)
CO2: 22 mmol/L (ref 20–29)
Calcium: 9.7 mg/dL (ref 8.7–10.3)
Chloride: 102 mmol/L (ref 96–106)
Creatinine, Ser: 0.64 mg/dL (ref 0.57–1.00)
Globulin, Total: 2.2 g/dL (ref 1.5–4.5)
Glucose: 103 mg/dL — ABNORMAL HIGH (ref 70–99)
Potassium: 4.3 mmol/L (ref 3.5–5.2)
Sodium: 140 mmol/L (ref 134–144)
Total Protein: 7 g/dL (ref 6.0–8.5)
eGFR: 100 mL/min/{1.73_m2} (ref >59)

## 2022-11-29 LAB — LP+NON-HDL CHOLESTEROL
Cholesterol, Total: 198 mg/dL (ref 100–199)
HDL: 108 mg/dL (ref >39)
LDL Chol Calc (NIH): 79 mg/dL (ref 0–99)
Total Non-HDL-Chol (LDL+VLDL): 90 mg/dL (ref 0–129)
Triglycerides: 60 mg/dL (ref 0–149)
VLDL Cholesterol Cal: 11 mg/dL (ref 5–40)

## 2022-12-04 ENCOUNTER — Other Ambulatory Visit: Payer: Self-pay | Admitting: Hematology and Oncology

## 2022-12-04 VITALS — Wt 102.0 lb

## 2022-12-04 DIAGNOSIS — N890 Mild vaginal dysplasia: Secondary | ICD-10-CM

## 2022-12-04 NOTE — Progress Notes (Signed)
Patient: Pragya Birkes           Date of Birth: [birth year]           MRN: [id number] Visit Date: 12/04/2022 PCP: Hoy Register, MD     Cervical Exam Pap smear completed: Pap test Abnormal Observations: Normal Recommendations: Hysterectomy. History of VAIN/ HPV+. 12/12/2021 - LSIL/ HPV+. 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 Pathology negative 06/2016: LSIL/no hpv testing done   Palpitation    Pure hypercholesterolemia 12/22/2015   Rash 08/21/2011   Smoking 08/12/2013   Xerosis of skin 02/21/2017    Family History  Problem Relation Age of Onset   Heart failure Mother        started in her 31's   Hypertension Mother    Hyperlipidemia Mother    Lung cancer Father    Hypertension Father    Hypercholesterolemia Father    Diabetes Sister        diet controlled   Dysrhythmia Sister        possible in the past   Healthy Daughter    Breast cancer Neg Hx     Social History   Occupational History   Not on file   Tobacco Use   Smoking status: Former    Current packs/day: 0.25    Average packs/day: 0.3 packs/day for 30.0 years (7.5 ttl pk-yrs)    Types: Cigarettes   Smokeless tobacco: Former   Tobacco comments:    4 cigs per day  Vaping Use   Vaping status: Never Used  Substance and Sexual Activity   Alcohol use: Yes    Alcohol/week: 1.0 standard drink of alcohol    Types: 1 Glasses of wine per week    Comment: occassional   Drug use: No   Sexual activity: Not Currently    Birth control/protection: Surgical    Comment: Partial hysterectomy, Uterous only. Pt still has her cervix and ovaries.

## 2022-12-11 LAB — CYTOLOGY - PAP
Comment: NEGATIVE
High risk HPV: POSITIVE — AB

## 2022-12-13 ENCOUNTER — Ambulatory Visit (INDEPENDENT_AMBULATORY_CARE_PROVIDER_SITE_OTHER): Payer: Self-pay | Admitting: General Surgery

## 2022-12-13 ENCOUNTER — Encounter: Payer: Self-pay | Admitting: General Surgery

## 2022-12-13 VITALS — BP 128/70 | HR 76 | Temp 98.0°F | Ht 67.0 in | Wt 102.0 lb

## 2022-12-13 DIAGNOSIS — M7989 Other specified soft tissue disorders: Secondary | ICD-10-CM

## 2022-12-13 DIAGNOSIS — R2241 Localized swelling, mass and lump, right lower limb: Secondary | ICD-10-CM

## 2022-12-13 NOTE — Progress Notes (Signed)
Patient ID: Melanie Harris, female   DOB: [birth year], 61 y.o.   MRN: [id number] CC: Right Thigh Mass History of Present Illness Melanie Harris is a 62 y.o. female who presents in consultation for a right thigh mass.  The patient reports that over the last 10 years she has noticed a mass in the skin of her right posterior thigh.  She has had this lanced several times with the last time was many years ago.  She says that intermittently it will inflame and drain pus.  The last time that it did this was approximately 2 weeks ago where drain spontaneously and she was placed on antibiotics.  She subsequently completed antibiotics..  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 Pathology negative 06/2016: LSIL/no hpv testing done   Palpitation    Pure hypercholesterolemia 12/22/2015   Rash 08/21/2011   Smoking 08/12/2013   Xerosis of skin 02/21/2017       Past Surgical History:  Procedure Laterality Date   ABDOMINAL HYSTERECTOMY  2004   COLPOSCOPY  12/12/2021   NASAL FLAP ROTATION Left 01/27/2021   Procedure: NASAL FLAP ROTATION;  Surgeon: Allena Napoleon, MD;  Location: [facility] OR;  Service: Plastics;  Laterality: Left;   NASAL FLAP ROTATION Left 10/10/2021   Procedure: Revision left nasal forehead flap with complex closure, possible cartilage graft to left nose;  Surgeon: Janne Napoleon, MD;  Location: [facility];  Service: Plastics;  Laterality: Left;   NASAL RECONSTRUCTION Left 01/27/2021   Procedure: NASAL RECONSTRUCTION;  Surgeon: Allena Napoleon, MD;  Location: [facility];  Service: Plastics;  Laterality: Left;   NASAL RECONSTRUCTION Bilateral 02/21/2021   Procedure:  DIVISION AND INSET OF NASAL FLAPS, DEBRIDEMENT RIGHT NASOLABIAL FLAP, REVISION OF INSET RIGHT NASOLABIAL FLAP;  Surgeon: Allena Napoleon, MD;  Location: [facility] OR;  Service: Plastics;  Laterality: Bilateral;   NASAL RECONSTRUCTION Right 03/13/2021   Procedure: Division and Inset forehead flap, with nasal reconstruction with flaps and grafts as necessary, left rib cartilage graft.;  Surgeon: Allena Napoleon, MD;  Location: [facility] OR;  Service: Plastics;  Laterality: Right;  1.5 hour   RHINOPLASTY Left 01/18/2021   Procedure: PARTIAL RHINECTOMY WITH FROZEN SECTION;  Surgeon: Serena Colonel, MD;  Location: [facility] OR;  Service: ENT;  Laterality: Left;   SKIN FULL THICKNESS GRAFT Left 01/27/2021   Procedure: SKIN GRAFT FULL THICKNESS;  Surgeon: Allena Napoleon, MD;  Location: [facility] OR;  Service: Plastics;  Laterality: Left;   TONSILLECTOMY  11-11 yrs old    Allergies  Allergen Reactions   Morphine And Codeine Nausea And Vomiting   Doxycycline Rash    Current Outpatient Medications  Medication Sig Dispense Refill   amLODipine (NORVASC) 10 MG tablet Take 1 tablet (10 mg total) by mouth daily. 90 tablet 1   atorvastatin (LIPITOR) 20 MG tablet Take 1 tablet (20 mg total) by mouth daily. 90 tablet 1   cholecalciferol (VITAMIN D) 25 MCG (1000 UNIT) tablet Take 1,000 Units by mouth every morning.     magnesium oxide (MAG-OX) 400 MG tablet Take 1 tablet (400 mg total) by mouth daily. 90 tablet 3  metoprolol succinate (TOPROL-XL) 100 MG 24 hr tablet Take 1 tablet (100 mg total) by mouth daily. 90 tablet 1   No current facility-administered medications for this visit.    Family History Family History  Problem Relation Age of Onset   Heart failure Mother        started in her 27's   Hypertension Mother    Hyperlipidemia Mother    Lung cancer Father    Hypertension Father    Hypercholesterolemia Father    Diabetes Sister        diet controlled   Dysrhythmia Sister        possible in the past   Healthy Daughter     Breast cancer Neg Hx        Social History Social History   Tobacco Use   Smoking status: Former    Current packs/day: 0.25    Average packs/day: 0.3 packs/day for 30.0 years (7.5 ttl pk-yrs)    Types: Cigarettes    Passive exposure: Past   Smokeless tobacco: Former   Tobacco comments:    4 cigs per day  Vaping Use   Vaping status: Never Used  Substance Use Topics   Alcohol use: Yes    Alcohol/week: 1.0 standard drink of alcohol    Types: 1 Glasses of wine per week    Comment: occassional   Drug use: No        ROS Full ROS of systems performed and is otherwise negative there than what is stated in the HPI  Physical Exam Blood pressure 128/70, pulse 76, temperature 98 F (36.7 C), height 5\' 7"  (1.702 m), weight 102 lb (46.3 kg), SpO2 99%.  No acute distress, PERRLA, normal work of breathing on room air, moving all extremities spontaneously  Right thigh posteriorly there is a mobile subcutaneous mass.  At the inferior edge of this there is scabbing.  There is no surrounding erythema.  I was not able to express any drainage from the mass but the patient says it was draining with a scab it is. Data Reviewed Reviewed past history and referral notes: Patient was recently started on antibiotics for this.  I have personally reviewed the patient's imaging and medical records.    Assessment    Posterior thigh soft tissue mass likely a sebaceous cyst that intermittently gets infected.  Her last infection of this was about 2 weeks ago  Plan    The mass is healing well after it was likely infected.  I think we can excise this at bedside but I will wait 2 weeks so that it can heal up.  I discussed the risk, benefits alternatives of the procedure including risk infection, bleeding and recurrence of the soft tissue growth.  She understands these risks and wishes to proceed    Kandis Cocking 12/13/2022, 10:37 AM

## 2022-12-13 NOTE — Patient Instructions (Signed)
We will scheduled for an excision of the area in office.

## 2022-12-19 ENCOUNTER — Telehealth: Payer: Self-pay

## 2022-12-19 NOTE — Telephone Encounter (Signed)
Left message for patient about lab results from [facility]. Left name and number for patient to call back.

## 2022-12-19 NOTE — Telephone Encounter (Signed)
Called patient to give pap smear results. Informed patient that pap smear showed LSIL and HPV was positive. Based on this result and her previous hx a colpo is recommended for FU. Patient voiced understanding. Patient is scheduled w/ [facility] for colpo on 01/25/23.

## 2022-12-22 ENCOUNTER — Other Ambulatory Visit (HOSPITAL_COMMUNITY): Payer: Self-pay

## 2022-12-26 ENCOUNTER — Other Ambulatory Visit: Payer: Self-pay

## 2022-12-26 ENCOUNTER — Other Ambulatory Visit: Payer: Self-pay | Admitting: Family Medicine

## 2022-12-27 ENCOUNTER — Encounter: Payer: Self-pay | Admitting: General Surgery

## 2022-12-27 ENCOUNTER — Ambulatory Visit (INDEPENDENT_AMBULATORY_CARE_PROVIDER_SITE_OTHER): Payer: Self-pay | Admitting: General Surgery

## 2022-12-27 VITALS — BP 132/71 | HR 66 | Temp 98.0°F | Ht 67.0 in | Wt 101.4 lb

## 2022-12-27 DIAGNOSIS — D1723 Benign lipomatous neoplasm of skin and subcutaneous tissue of right leg: Secondary | ICD-10-CM

## 2022-12-27 DIAGNOSIS — L02415 Cutaneous abscess of right lower limb: Secondary | ICD-10-CM

## 2022-12-27 DIAGNOSIS — M7989 Other specified soft tissue disorders: Secondary | ICD-10-CM

## 2022-12-27 NOTE — Patient Instructions (Addendum)
Today we have drained your Abscess in the office. The numbing medication will wear off in approximately 4-8 hours. You will have some pain to the area afterwards but should not be as severe as prior to the procedure.  You have sutures that will need to be removed.  You may take 2 extra strength Tylenol, or 3 regular Ibuprofen tablets every 6 hours as needed for pain and discomfort.  You may shower. First remove all of the packing and wash letting the warm soapy water run over the area, rinse well, and pat dry and repack.   Change your packing every day. Fill the cavity with packing strip and cover with clean dry gauze.   We will see you back as scheduled below.   If you have any questions or concerns prior to your appointment, please call our office and speak with a nurse.  Incision and Drainage Incision and drainage is a surgical procedure to open and drain a fluid-filled sac. The sac may be filled with pus, mucus, or blood. Examples of fluid-filled sacs that may need surgical drainage include cysts, skin infections (abscesses), and red lumps that develop from a ruptured cyst or a small abscess (boils). You may need this procedure if the affected area is large, painful, infected, or not healing well. Tell a health care provider about: Any allergies you have. All medicines you are taking, including vitamins, herbs, eye drops, creams, and over-the-counter medicines. Any problems you or family members have had with anesthetic medicines. Any blood disorders you have. Any surgeries you have had. Any medical conditions you have. Whether you are pregnant or may be pregnant. What are the risks? Generally, this is a safe procedure. However, problems may occur, including: Infection. Bleeding. Allergic reactions to medicines. Scarring.  What happens before the procedure? You may need an ultrasound or other imaging tests to see how large or deep the fluid-filled sac is. You may have blood  tests to check for infection. You may get a tetanus shot. You may be given antibiotic medicine to help prevent infection. Follow instructions from your health care provider about eating or drinking restrictions. Ask your health care provider about: Changing or stopping your regular medicines. This is especially important if you are taking diabetes medicines or blood thinners. Taking medicines such as aspirin and ibuprofen. These medicines can thin your blood. Do not take these medicines before your procedure if your health care provider instructs you not to. Plan to have someone take you home after the procedure. If you will be going home right after the procedure, plan to have someone stay with you for 24 hours. What happens during the procedure? To reduce your risk of infection: Your health care team will wash or sanitize their hands. Your skin will be washed with soap. You will be given one or more of the following: A medicine to help you relax (sedative). A medicine to numb the area (local anesthetic). A medicine to make you fall asleep (general anesthetic). An incision will be made in the top of the fluid-filled sac. The contents of the sac may be squeezed out, or a syringe or tube (catheter) may be used to empty the sac. The catheter may be left in place for several weeks to drain any fluid. Or, your health care provider may stitch open the edges of the incision to make a long-term opening for drainage (marsupialization). The inside of the sac may be washed out (irrigated) with a sterile solution and packed with gauze  before it is covered with a bandage (dressing). The procedure may vary among health care providers and hospitals. What happens after the procedure? Your blood pressure, heart rate, breathing rate, and blood oxygen level will be monitored often until the medicines you were given have worn off. Do not drive for 24 hours if you received a sedative. This information is not  intended to replace advice given to you by your health care provider. Make sure you discuss any questions you have with your health care provider. Document Released: 07/18/2000 Document Revised: 06/30/2015 Document Reviewed: 11/12/2014 Elsevier Interactive Patient Education  2017 Else[employer] Inc[employer]   Incision and Drainage, Care After Refer to this sheet in the next few weeks. These instructions provide you with information about caring for yourself after your procedure. Your health care provider may also give you more specific instructions. Your treatment has been planned according to current medical practices, but problems sometimes occur. Call your health care provider if you have any problems or questions after your procedure. What can I expect after the procedure? After the procedure, it is common to have: Pain or discomfort around your incision site. Drainage from your incision.  Follow these instructions at home: Take over-the-counter and prescription medicines only as told by your health care provider. If you were prescribed an antibiotic medicine, take it as told by your health care provider. Do not stop taking the antibiotic even if you start to feel better. Follow instructions from your health care provider about: How to take care of your incision. When and how you should change your packing and bandage (dressing). Wash your hands with soap and water before you change your dressing. If soap and water are not available, use hand sanitizer. When you should remove your dressing. Do not take baths, swim, or use a hot tub until your health care provider approves. Keep all follow-up visits as told by your health care provider. This is important. Check your incision area every day for signs of infection. Check for: More redness, swelling, or pain. More fluid or blood. Warmth. Pus or a bad smell. Contact a health care provider if: Your cyst or abscess returns. You have a fever. You have  more redness, swelling, or pain around your incision. You have more fluid or blood coming from your incision. Your incision feels warm to the touch. You have pus or a bad smell coming from your incision. Get help right away if: You have severe pain or bleeding. You cannot eat or drink without vomiting. You have decreased urine output. You become short of breath. You have chest pain. You cough up blood. The area where the incision and drainage occurred becomes numb or it tingles. This information is not intended to replace advice given to you by your health care provider. Make sure you discuss any questions you have with your health care provider. Document Released: 04/16/2011 Document Revised: 06/24/2015 Document Reviewed: 11/12/2014 Elsevier Interactive Patient Education  2017 [employer].

## 2022-12-27 NOTE — Progress Notes (Signed)
Preoperative diagnosis: Right leg soft tissue mass Postoperative diagnosis: Infected lipoma Procedure: Incision and drainage of leg abscess, removal of soft tissue mass approximately 4 cm  After informed consent was obtained the patient was placed on the procedure table in the prone position.  A timeout was called identifying correct patient, site, side and procedure.  The skin of the right leg was then cleaned with ChloraPrep.  Comfort was obtained with 1% lidocaine.  Approximately 10 cc were used.  The soft tissue mass did appear to have some fluctuance to it.  An incision was made over the fluctuant part and there was expulsion of approximately 2 cc of pus.  There is no sebaceous cyst under this but rather it appeared to be a lipoma.  The soft tissue mass was dissected off of the underlying subcutaneous tissue.  It was removed and 2 pieces and the total size of the lipoma removed was approximately 4 cm.  Given that there was purulence I elected not to fully close the incision.  The inferior part of the incision was loosely approximated with 4-0 Vicryl sutures.  The top part of the incision was left open and this was packed with iodoform gauze packing.  The patient tolerated the procedure well.  She will return to the clinic in 2 weeks for wound check and suture removal.  Tissue mass was sent to pathology for evaluation.

## 2022-12-28 ENCOUNTER — Other Ambulatory Visit: Payer: Self-pay

## 2022-12-28 ENCOUNTER — Other Ambulatory Visit (HOSPITAL_COMMUNITY): Payer: Self-pay

## 2022-12-28 MED ORDER — VITAMIN D3 25 MCG (1000 UNIT) PO TABS
1000.0000 [IU] | ORAL_TABLET | Freq: Every morning | ORAL | 0 refills | Status: AC
  Filled 2022-12-28: qty 12, 12d supply, fill #0

## 2022-12-29 ENCOUNTER — Other Ambulatory Visit (HOSPITAL_COMMUNITY): Payer: Self-pay

## 2022-12-31 ENCOUNTER — Other Ambulatory Visit: Payer: Self-pay

## 2022-12-31 LAB — SURGICAL PATHOLOGY

## 2023-01-10 ENCOUNTER — Ambulatory Visit (INDEPENDENT_AMBULATORY_CARE_PROVIDER_SITE_OTHER): Payer: Self-pay | Admitting: General Surgery

## 2023-01-10 ENCOUNTER — Encounter: Payer: Self-pay | Admitting: General Surgery

## 2023-01-10 VITALS — BP 139/77 | HR 76 | Temp 97.8°F | Ht 67.0 in | Wt 104.4 lb

## 2023-01-10 DIAGNOSIS — D1723 Benign lipomatous neoplasm of skin and subcutaneous tissue of right leg: Secondary | ICD-10-CM

## 2023-01-10 NOTE — Progress Notes (Signed)
Outpatient Surgical Follow Up  01/10/2023  Melanie Harris is an 62 y.o. female.   Chief Complaint  Patient presents with   Routine Post Op    Excision right leg lipoma    HPI: Melanie Harris returns following an excision of lipoma that was infected.  She reports doing well.  She said initially there was some drainage from the wound as it was left open due to the abscess.  However, she reports no further drainage.  She denies any pain at the site and has had no fevers or chills.  She denies any spreading erythema around the wound.  Past Medical History:  Diagnosis Date   Abscess of right thigh 07/25/2011   Abscess of thigh    right   Anxiety state 12/21/2015   Cough 08/12/2013   Hypertension    Low grade squamous intraepithelial lesion (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 Pathology negative 06/2016: LSIL/no hpv testing done   Palpitation    Pure hypercholesterolemia 12/22/2015   Rash 08/21/2011   Smoking 08/12/2013   Xerosis of skin 02/21/2017    Past Surgical History:  Procedure Laterality Date   ABDOMINAL HYSTERECTOMY  2004   COLPOSCOPY  12/12/2021   NASAL FLAP ROTATION Left 01/27/2021   Procedure: NASAL FLAP ROTATION;  Surgeon: Allena Napoleon, MD;  Location: [facility] OR;  Service: Plastics;  Laterality: Left;   NASAL FLAP ROTATION Left 10/10/2021   Procedure: Revision left nasal forehead flap with complex closure, possible cartilage graft to left nose;  Surgeon: Janne Napoleon, MD;  Location: [facility];  Service: Plastics;  Laterality: Left;   NASAL RECONSTRUCTION Left 01/27/2021   Procedure: NASAL RECONSTRUCTION;  Surgeon: Allena Napoleon, MD;  Location: [facility] OR;  Service: Plastics;  Laterality: Left;   NASAL RECONSTRUCTION Bilateral 02/21/2021   Procedure: DIVISION AND INSET OF NASAL FLAPS, DEBRIDEMENT RIGHT NASOLABIAL FLAP, REVISION OF INSET RIGHT NASOLABIAL FLAP;  Surgeon: Allena Napoleon, MD;  Location: [facility] OR;  Service: Plastics;  Laterality: Bilateral;   NASAL RECONSTRUCTION Right 03/13/2021   Procedure: Division and Inset forehead flap, with nasal reconstruction with flaps and grafts as necessary, left rib cartilage graft.;  Surgeon: Allena Napoleon, MD;  Location: [facility] OR;  Service: Plastics;  Laterality: Right;  1.5 hour   RHINOPLASTY Left 01/18/2021   Procedure: PARTIAL RHINECTOMY WITH FROZEN SECTION;  Surgeon: Serena Colonel, MD;  Location: [facility] OR;  Service: ENT;  Laterality: Left;   SKIN FULL THICKNESS GRAFT Left 01/27/2021   Procedure: SKIN GRAFT FULL THICKNESS;  Surgeon: Allena Napoleon, MD;  Location: [facility] OR;  Service: Plastics;  Laterality: Left;   TONSILLECTOMY  10-11 yrs old    Family History  Problem Relation Age of Onset   Heart failure Mother        started in her 27's   Hypertension Mother    Hyperlipidemia Mother    Lung cancer Father    Hypertension Father    Hypercholesterolemia Father    Diabetes Sister        diet controlled   Dysrhythmia Sister        possible in the past   Healthy Daughter    Breast cancer Neg Hx     Social History:  reports that she has quit smoking. Her smoking use included cigarettes. She has a 7.5 pack-year smoking history. She has been exposed to tobacco smoke. She has quit using  smokeless tobacco. She reports current alcohol use of about 1.0 standard drink of alcohol per week. She reports that she does not use drugs.  Allergies:  Allergies  Allergen Reactions   Morphine And Codeine Nausea And Vomiting   Doxycycline Rash    Medications reviewed.    ROS Full ROS performed and is otherwise negative other than what is stated in HPI   BP 139/77   Pulse 76   Temp 97.8 F (36.6 C) (Oral)   Ht 5\' 7"  (1.702 m)   Wt 104 lb 6.4 oz (47.4 kg)   SpO2 96%   BMI 16.35 kg/m   Physical Exam Posterior leg where the excision was done has some scabbing at the superior end.  The inferior end is closed with nylon  sutures.  There is some induration tissue but no recurrence of abscess.  There is no drainage expressed from the wound.  She has no pain upon palpation of the wound and there is no surrounding spreading erythema.  The 3 nylon sutures were cut and removed fully.    Pathology consistent with lipoma and abscess    No results found for this or any previous visit (from the past 48 hour(s)). No results found.  Assessment/Plan:  Patient is status post excision of lipoma that was infected.  The wound was left open to heal by secondary intention.  She can place a gauze and tape over the wound.  Sutures were removed today.  She can follow-up with [place] on an as-needed basis.  Pathology discussed with patient   Baker Pierini, M.D. [facility]

## 2023-01-10 NOTE — Patient Instructions (Signed)
Lipoma Removal, Care After The following information offers guidance on how to care for yourself after your procedure. Your health care provider may also give you more specific instructions. If you have problems or questions, contact your health care provider. What can I expect after the procedure? After the procedure, it is common to have: Mild pain. Swelling. Bruising. Follow these instructions at home: Bathing  Do not take baths, swim, or use a hot tub until your health care provider approves. Ask your health care provider if you may take showers. You may only be allowed to take sponge baths. Keep your bandage (dressing) clean and dry until your health care provider says it can be removed. Incision care  Follow instructions from your health care provider about how to take care of your incision. Make sure you: Wash your hands with soap and water for at least 20 seconds before and after you change your dressing. If soap and water are not available, use hand sanitizer. Change your dressing as told by your health care provider. Leave stitches (sutures), skin glue, or adhesive strips in place. These skin closures may need to stay in place for 2 weeks or longer. If adhesive strip edges start to loosen and curl up, you may trim the loose edges. Do not remove adhesive strips completely unless your health care provider tells you to do that. Check your incision area every day for signs of infection. Check for: More redness, swelling, or pain. Fluid or blood. Warmth. Pus or a bad smell. Medicines Take over-the-counter and prescription medicines only as told by your health care provider. If you were prescribed an antibiotic medicine, use it as told by your health care provider. Do not stop using the antibiotic even if you start to feel better. General instructions  If you were given a sedative during the procedure, it can affect you for several hours. Do not drive or operate machinery until your  health care provider says that it is safe. Do not use any products that contain nicotine or tobacco before the procedure. These products include cigarettes, chewing tobacco, and vaping devices, such as e-cigarettes. These can delay healing after surgery. If you need help quitting, ask your health care provider. Return to your normal activities as told by your health care provider. Ask your health care provider what activities are safe for you. Keep all follow-up visits. This is important. Contact a health care provider if: You have more redness, swelling, or pain around your incision. You have fluid or blood coming from your incision. Your incision feels warm to the touch. You have pus or a bad smell coming from your incision. You have pain that does not get better with medicine. Get help right away if: You have chills or a fever. You have severe pain. Summary After the procedure, it is common to have mild pain, swelling, and bruising. Follow instructions from your health care provider about how to take care of your incision. Contact a health care provider if you have signs of infection such as more redness, swelling, or pain. This information is not intended to replace advice given to you by your health care provider. Make sure you discuss any questions you have with your health care provider. Document Revised: 02/10/2021 Document Reviewed: 02/10/2021 Elsevier Patient Education  2024 [employer].

## 2023-01-25 ENCOUNTER — Ambulatory Visit: Payer: Self-pay | Admitting: Hematology and Oncology

## 2023-01-25 VITALS — Wt 104.0 lb

## 2023-01-25 DIAGNOSIS — R87622 Low grade squamous intraepithelial lesion on cytologic smear of vagina (LGSIL): Secondary | ICD-10-CM

## 2023-01-25 NOTE — Progress Notes (Signed)
[facility]   GYNECOLOGY CLINIC COLPOSCOPY PROCEDURE NOTE  Ms. Melanie Harris is a 62 y.o. G1P1 here for colposcopy for low-grade squamous intraepithelial neoplasia (LGSIL - encompassing HPV,mild dysplasia,CIN I) pap smear on 12/04/2022. Discussed role for HPV in cervical dysplasia, need for surveillance.  Patient given informed consent, signed copy in the chart, time out was performed.  Placed in lithotomy position. Cervix viewed with speculum and colposcope after application of acetic acid.   Colposcopy adequate? Yes  no visible lesions.   Patient was given post procedure instructions.  Will follow up  and manage accordingly.  Routine preventative health maintenance measures emphasized.   Ilda Basset A, NP 01/25/2023 8:22 AM

## 2023-01-29 HISTORY — PX: COLPOSCOPY: SHX161

## 2023-01-31 ENCOUNTER — Other Ambulatory Visit: Payer: Self-pay | Admitting: Otolaryngology

## 2023-02-14 ENCOUNTER — Ambulatory Visit (INDEPENDENT_AMBULATORY_CARE_PROVIDER_SITE_OTHER): Payer: Self-pay | Admitting: General Surgery

## 2023-02-14 ENCOUNTER — Encounter: Payer: Self-pay | Admitting: General Surgery

## 2023-02-14 VITALS — BP 127/69 | HR 79 | Temp 98.0°F | Ht 67.0 in | Wt 103.0 lb

## 2023-02-14 DIAGNOSIS — D1723 Benign lipomatous neoplasm of skin and subcutaneous tissue of right leg: Secondary | ICD-10-CM

## 2023-02-14 DIAGNOSIS — Z09 Encounter for follow-up examination after completed treatment for conditions other than malignant neoplasm: Secondary | ICD-10-CM

## 2023-02-14 NOTE — Progress Notes (Signed)
 Outpatient Surgical Follow Up  02/14/2023  Melanie Harris is an 63 y.o. female.   Chief Complaint  Patient presents with   Follow-up    HPI: Patient returns s/p I and D and excision of leg abscess. She is doing well. There is no drainage or pain at the site. There is some hyperpigmentation around the scar that she has noted. She denies any fevers or chills.   Past Medical History:  Diagnosis Date   Abscess of right thigh 07/25/2011   Abscess of thigh    right   Anxiety state 12/21/2015   Cough 08/12/2013   Hypertension    Low grade squamous intraepithelial lesion (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 Pathology negative 06/2016: LSIL/no hpv testing done   Palpitation    Pure hypercholesterolemia 12/22/2015   Rash 08/21/2011   Smoking 08/12/2013   Xerosis of skin 02/21/2017    Past Surgical History:  Procedure Laterality Date   ABDOMINAL HYSTERECTOMY  2004   COLPOSCOPY  12/12/2021   NASAL FLAP ROTATION Left 01/27/2021   Procedure: NASAL FLAP ROTATION;  Surgeon: Elisabeth Craig RAMAN, MD;  Location: [facility] OR;  Service: Plastics;  Laterality: Left;   NASAL FLAP ROTATION Left 10/10/2021   Procedure: Revision left nasal forehead flap with complex closure, possible cartilage graft to left nose;  Surgeon: Marene Sieving, MD;  Location: [facility];  Service: Plastics;  Laterality: Left;   NASAL RECONSTRUCTION Left 01/27/2021   Procedure: NASAL RECONSTRUCTION;  Surgeon: Elisabeth Craig RAMAN, MD;  Location: [facility] OR;  Service: Plastics;  Laterality: Left;   NASAL RECONSTRUCTION Bilateral 02/21/2021   Procedure: DIVISION AND INSET OF NASAL FLAPS, DEBRIDEMENT RIGHT NASOLABIAL FLAP, REVISION OF INSET RIGHT NASOLABIAL FLAP;  Surgeon: Elisabeth Craig RAMAN, MD;  Location: [facility] OR;  Service: Plastics;  Laterality: Bilateral;   NASAL RECONSTRUCTION Right 03/13/2021   Procedure: Division and Inset forehead flap, with  nasal reconstruction with flaps and grafts as necessary, left rib cartilage graft.;  Surgeon: Elisabeth Craig RAMAN, MD;  Location: [facility] OR;  Service: Plastics;  Laterality: Right;  1.5 hour   RHINOPLASTY Left 01/18/2021   Procedure: PARTIAL RHINECTOMY WITH FROZEN SECTION;  Surgeon: Jesus Oliphant, MD;  [facility];  Service: ENT;  Laterality: Left;   SKIN FULL THICKNESS GRAFT Left 01/27/2021   Procedure: SKIN GRAFT FULL THICKNESS;  Surgeon: Elisabeth Craig RAMAN, MD;  Location: [facility] OR;  Service: Plastics;  Laterality: Left;   TONSILLECTOMY  10-11 yrs old    Family History  Problem Relation Age of Onset   Heart failure Mother        started in her 24's   Hypertension Mother    Hyperlipidemia Mother    Lung cancer Father    Hypertension Father    Hypercholesterolemia Father    Diabetes Sister        diet controlled   Dysrhythmia Sister        possible in the past   Healthy Daughter    Breast cancer Neg Hx     Social History:  reports that she has quit smoking. Her smoking use included cigarettes. She has a 7.5 pack-year smoking history. She has been exposed to tobacco smoke. She has quit using smokeless tobacco. She reports current alcohol use of about 1.0 standard drink of alcohol per week. She reports that she does not use drugs.  Allergies:  Allergies  Allergen Reactions  Morphine And Codeine Nausea And Vomiting   Doxycycline  Rash    Medications reviewed.    ROS Full ROS performed and is otherwise negative other than what is stated in HPI   BP 127/69   Pulse 79   Temp 98 F (36.7 C)   Ht 5' 7 (1.702 m)   Wt 103 lb (46.7 kg)   SpO2 99%   BMI 16.13 kg/m   Physical Exam Site of I and D and partial closure is healing well. There is still some induration tissue with hyperpigmentation but no erythema to suggest infection. No drainage.     No results found for this or any previous visit (from the past 48 hours). No results found.  Assessment/Plan:  S/p I and D of leg and  excision of abscess and soft tissue lipoma. Doing well. She can massage area to help reduce swelling. She can also use vitamin e cream to help with scar. Return to clinic PRN.    Jayson Endow, M.D. [facility]

## 2023-02-14 NOTE — Patient Instructions (Signed)
   Follow-up with our office as needed.  Please call and ask to speak with a nurse if you develop questions or concerns.

## 2023-02-21 ENCOUNTER — Other Ambulatory Visit: Payer: Self-pay

## 2023-02-25 ENCOUNTER — Other Ambulatory Visit: Payer: Self-pay

## 2023-02-25 ENCOUNTER — Encounter (HOSPITAL_COMMUNITY): Payer: Self-pay | Admitting: Otolaryngology

## 2023-02-25 NOTE — Progress Notes (Signed)
PCP/Internal Med - Dr Hoy Register Cardiologist - Dr Lewayne Bunting   Chest x-ray - n/a EKG - DOS (02/27/23) Stress Test - n/a ECHO - 05/31/17 Cardiac Cath - n/a  ICD Pacemaker/Loop - n/a  Sleep Study -  n/a  Diabetes - n/a  Aspirin and Blood Thinner Instructions:  n/a  ERAS - Clear liquids til 4:30 AM DOS.  Anesthesia review: No  STOP now taking any Aspirin (unless otherwise instructed by your surgeon), Aleve, Naproxen, Ibuprofen, Motrin, Advil, Goody's, BC's, all herbal medications, fish oil, and all vitamins.   Coronavirus Screening Do you have any of the following symptoms:  Cough yes/no: No Fever (>100.46F)  yes/no: No Runny nose yes/no: No Sore throat yes/no: No Difficulty breathing/shortness of breath  yes/no: No  Have you traveled in the last 14 days and where? yes/no: No  Patient verbalized understanding of instructions that were given via phone.

## 2023-02-26 NOTE — Anesthesia Preprocedure Evaluation (Signed)
Anesthesia Evaluation  Patient identified by MRN, date of birth, ID band Patient awake    Reviewed: Allergy & Precautions, NPO status , Patient's Chart, lab work & pertinent test results  History of Anesthesia Complications Negative for: history of anesthetic complications  Airway Mallampati: III  TM Distance: >3 FB Neck ROM: Full    Dental  (+) Chipped   Pulmonary COPD, Patient did not abstain from smoking., former smoker   Pulmonary exam normal        Cardiovascular hypertension, Pt. on medications and Pt. 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  Reproductive/Obstetrics                              Anesthesia Physical Anesthesia Plan  ASA: 3  Anesthesia Plan: General   Post-op Pain Management: Tylenol PO (pre-op)* and Celebrex PO (pre-op)*   Induction: Intravenous  PONV Risk Score and Plan: 3 and Ondansetron, Dexamethasone, Midazolam and Treatment may vary due to age or medical condition  Airway Management Planned:   Additional Equipment: None  Intra-op Plan:   Post-operative Plan: Extubation in OR  Informed Consent: I have reviewed the patients History and Physical, chart, labs and discussed the procedure including the risks, benefits and alternatives for the proposed anesthesia with the patient or authorized representative who has indicated his/her understanding and acceptance.     Dental advisory given  Plan Discussed with: Anesthesiologist and CRNA  Anesthesia Plan  Comments:          Anesthesia Quick Evaluation

## 2023-02-27 ENCOUNTER — Encounter (HOSPITAL_COMMUNITY)
Admission: RE | Disposition: A | Discharge status: Home / Self Care | Payer: Self-pay | Source: Home / Self Care | Attending: Otolaryngology

## 2023-02-27 ENCOUNTER — Other Ambulatory Visit: Payer: Self-pay

## 2023-02-27 ENCOUNTER — Ambulatory Visit (HOSPITAL_COMMUNITY)
Admission: RE | Admit: 2023-02-27 | Discharge: 2023-02-27 | Disposition: A | Discharge status: Home / Self Care | Payer: Self-pay | Attending: Otolaryngology | Admitting: Otolaryngology

## 2023-02-27 ENCOUNTER — Ambulatory Visit (HOSPITAL_COMMUNITY): Payer: Self-pay | Admitting: Anesthesiology

## 2023-02-27 ENCOUNTER — Ambulatory Visit (HOSPITAL_BASED_OUTPATIENT_CLINIC_OR_DEPARTMENT_OTHER): Payer: Self-pay | Admitting: Anesthesiology

## 2023-02-27 DIAGNOSIS — Z87891 Personal history of nicotine dependence: Secondary | ICD-10-CM

## 2023-02-27 DIAGNOSIS — J34829 Nasal valve collapse, unspecified: Secondary | ICD-10-CM | POA: Insufficient documentation

## 2023-02-27 DIAGNOSIS — I10 Essential (primary) hypertension: Secondary | ICD-10-CM | POA: Insufficient documentation

## 2023-02-27 DIAGNOSIS — M950 Acquired deformity of nose: Secondary | ICD-10-CM | POA: Insufficient documentation

## 2023-02-27 DIAGNOSIS — C44301 Unspecified malignant neoplasm of skin of nose: Secondary | ICD-10-CM

## 2023-02-27 DIAGNOSIS — Z79899 Other long term (current) drug therapy: Secondary | ICD-10-CM | POA: Insufficient documentation

## 2023-02-27 HISTORY — DX: Malignant (primary) neoplasm, unspecified: C80.1

## 2023-02-27 HISTORY — PX: NASAL FLAP ROTATION: SHX5366

## 2023-02-27 LAB — CBC
HCT: 40 % (ref 36.0–46.0)
Hemoglobin: 13.4 g/dL (ref 12.0–15.0)
MCH: 32.4 pg (ref 26.0–34.0)
MCHC: 33.5 g/dL (ref 30.0–36.0)
MCV: 96.6 fL (ref 80.0–100.0)
Platelets: 274 10*3/uL (ref 150–400)
RBC: 4.14 MIL/uL (ref 3.87–5.11)
RDW: 13.2 % (ref 11.5–15.5)
WBC: 6.9 10*3/uL (ref 4.0–10.5)
nRBC: 0 % (ref 0.0–0.2)

## 2023-02-27 LAB — BASIC METABOLIC PANEL
Anion gap: 13 (ref 5–15)
BUN: 12 mg/dL (ref 8–23)
CO2: 24 mmol/L (ref 22–32)
Calcium: 9.6 mg/dL (ref 8.9–10.3)
Chloride: 101 mmol/L (ref 98–111)
Creatinine, Ser: 0.75 mg/dL (ref 0.44–1.00)
GFR, Estimated: >60 mL/min (ref >60)
Glucose, Bld: 105 mg/dL — ABNORMAL HIGH (ref 70–99)
Potassium: 3.9 mmol/L (ref 3.5–5.1)
Sodium: 138 mmol/L (ref 135–145)

## 2023-02-27 SURGERY — CREATION, FLAP, PEDICLED ROTATION, FOREHEAD AND NOSE
Anesthesia: General | Site: Nose | Laterality: Left

## 2023-02-27 MED ORDER — MIDAZOLAM HCL 2 MG/2ML IJ SOLN
INTRAMUSCULAR | Status: DC | PRN
  Administered 2023-02-27: 2 mg via INTRAVENOUS

## 2023-02-27 MED ORDER — LIDOCAINE 2% (20 MG/ML) 5 ML SYRINGE
INTRAMUSCULAR | Status: AC
  Filled 2023-02-27: qty 5

## 2023-02-27 MED ORDER — MIDAZOLAM HCL 2 MG/2ML IJ SOLN
INTRAMUSCULAR | Status: AC
  Filled 2023-02-27: qty 2

## 2023-02-27 MED ORDER — ROCURONIUM BROMIDE 10 MG/ML (PF) SYRINGE
PREFILLED_SYRINGE | INTRAVENOUS | Status: DC | PRN
  Administered 2023-02-27: 50 mg via INTRAVENOUS

## 2023-02-27 MED ORDER — ONDANSETRON HCL 4 MG/2ML IJ SOLN: 4.0000 mg | Freq: Once | INTRAMUSCULAR | Status: DC | PRN

## 2023-02-27 MED ORDER — 0.9 % SODIUM CHLORIDE (POUR BTL) OPTIME
TOPICAL | Status: DC | PRN
  Administered 2023-02-27: 1000 mL

## 2023-02-27 MED ORDER — ACETAMINOPHEN 160 MG/5ML PO SOLN
325.0000 mg | ORAL | Status: DC | PRN
Start: 2023-02-27 — End: 2023-02-27

## 2023-02-27 MED ORDER — LIDOCAINE 2% (20 MG/ML) 5 ML SYRINGE
INTRAMUSCULAR | Status: DC | PRN
  Administered 2023-02-27: 80 mg via INTRAVENOUS

## 2023-02-27 MED ORDER — CEFAZOLIN SODIUM-DEXTROSE 2-4 GM/100ML-% IV SOLN
2.0000 g | INTRAVENOUS | Status: AC
  Administered 2023-02-27: 2 g via INTRAVENOUS
  Filled 2023-02-27: qty 100

## 2023-02-27 MED ORDER — CELECOXIB 200 MG PO CAPS
200.0000 mg | ORAL_CAPSULE | Freq: Once | ORAL | Status: AC
  Administered 2023-02-27: 200 mg via ORAL
  Filled 2023-02-27: qty 1

## 2023-02-27 MED ORDER — PROPOFOL 10 MG/ML IV BOLUS
INTRAVENOUS | Status: DC | PRN
  Administered 2023-02-27: 100 mg via INTRAVENOUS

## 2023-02-27 MED ORDER — EPHEDRINE SULFATE-NACL 50-0.9 MG/10ML-% IV SOSY
PREFILLED_SYRINGE | INTRAVENOUS | Status: DC | PRN
  Administered 2023-02-27 (×3): 5 mg via INTRAVENOUS
  Administered 2023-02-27: 10 mg via INTRAVENOUS

## 2023-02-27 MED ORDER — EPHEDRINE 5 MG/ML INJ
INTRAVENOUS | Status: AC
  Filled 2023-02-27: qty 5

## 2023-02-27 MED ORDER — PHENYLEPHRINE 80 MCG/ML (10ML) SYRINGE FOR IV PUSH (FOR BLOOD PRESSURE SUPPORT)
PREFILLED_SYRINGE | INTRAVENOUS | Status: AC
  Filled 2023-02-27: qty 10

## 2023-02-27 MED ORDER — BSS IO SOLN
INTRAOCULAR | Status: AC
  Filled 2023-02-27: qty 15

## 2023-02-27 MED ORDER — LACTATED RINGERS IV SOLN: INTRAVENOUS | Status: DC

## 2023-02-27 MED ORDER — PROPOFOL 10 MG/ML IV BOLUS
INTRAVENOUS | Status: AC
  Filled 2023-02-27: qty 20

## 2023-02-27 MED ORDER — ROCURONIUM BROMIDE 10 MG/ML (PF) SYRINGE
PREFILLED_SYRINGE | INTRAVENOUS | Status: AC
  Filled 2023-02-27: qty 10

## 2023-02-27 MED ORDER — FENTANYL CITRATE (PF) 250 MCG/5ML IJ SOLN
INTRAMUSCULAR | Status: AC
  Filled 2023-02-27: qty 5

## 2023-02-27 MED ORDER — PHENYLEPHRINE 80 MCG/ML (10ML) SYRINGE FOR IV PUSH (FOR BLOOD PRESSURE SUPPORT)
PREFILLED_SYRINGE | INTRAVENOUS | Status: DC | PRN
  Administered 2023-02-27: 80 ug via INTRAVENOUS

## 2023-02-27 MED ORDER — LIDOCAINE-EPINEPHRINE 1 %-1:100000 IJ SOLN
INTRAMUSCULAR | Status: AC
Start: 2023-02-27 — End: ?
  Filled 2023-02-27: qty 1

## 2023-02-27 MED ORDER — DEXAMETHASONE SODIUM PHOSPHATE 10 MG/ML IJ SOLN
INTRAMUSCULAR | Status: AC
  Filled 2023-02-27: qty 1

## 2023-02-27 MED ORDER — BACITRACIN ZINC 500 UNIT/GM EX OINT
TOPICAL_OINTMENT | CUTANEOUS | Status: DC | PRN
  Administered 2023-02-27: 1 via TOPICAL

## 2023-02-27 MED ORDER — OXYCODONE HCL 5 MG/5ML PO SOLN: 5.0000 mg | Freq: Once | ORAL | Status: DC | PRN

## 2023-02-27 MED ORDER — BACITRACIN ZINC 500 UNIT/GM EX OINT
TOPICAL_OINTMENT | CUTANEOUS | Status: AC
  Filled 2023-02-27: qty 28.35

## 2023-02-27 MED ORDER — CHLORHEXIDINE GLUCONATE 0.12 % MT SOLN
15.0000 mL | Freq: Once | OROMUCOSAL | Status: AC
  Administered 2023-02-27: 15 mL via OROMUCOSAL
  Filled 2023-02-27: qty 15

## 2023-02-27 MED ORDER — ONDANSETRON HCL 4 MG/2ML IJ SOLN
INTRAMUSCULAR | Status: DC | PRN
  Administered 2023-02-27: 4 mg via INTRAVENOUS

## 2023-02-27 MED ORDER — SUGAMMADEX SODIUM 200 MG/2ML IV SOLN
INTRAVENOUS | Status: DC | PRN
  Administered 2023-02-27: 200 mg via INTRAVENOUS

## 2023-02-27 MED ORDER — ONDANSETRON HCL 4 MG/2ML IJ SOLN
INTRAMUSCULAR | Status: AC
  Filled 2023-02-27: qty 2

## 2023-02-27 MED ORDER — ACETAMINOPHEN 325 MG PO TABS
325.0000 mg | ORAL_TABLET | ORAL | Status: DC | PRN
Start: 2023-02-27 — End: 2023-02-27

## 2023-02-27 MED ORDER — ACETAMINOPHEN 500 MG PO TABS
1000.0000 mg | ORAL_TABLET | Freq: Once | ORAL | Status: AC
  Administered 2023-02-27: 1000 mg via ORAL
  Filled 2023-02-27: qty 2

## 2023-02-27 MED ORDER — FENTANYL CITRATE (PF) 100 MCG/2ML IJ SOLN: 25.0000 ug | INTRAMUSCULAR | Status: DC | PRN

## 2023-02-27 MED ORDER — OXYCODONE HCL 5 MG PO TABS: 5.0000 mg | ORAL_TABLET | Freq: Once | ORAL | Status: DC | PRN

## 2023-02-27 MED ORDER — MEPERIDINE HCL 25 MG/ML IJ SOLN: 6.2500 mg | INTRAMUSCULAR | Status: DC | PRN

## 2023-02-27 MED ORDER — FENTANYL CITRATE (PF) 250 MCG/5ML IJ SOLN
INTRAMUSCULAR | Status: DC | PRN
  Administered 2023-02-27: 50 ug via INTRAVENOUS

## 2023-02-27 MED ORDER — LIDOCAINE-EPINEPHRINE 1 %-1:100000 IJ SOLN
INTRAMUSCULAR | Status: DC | PRN
  Administered 2023-02-27: 1 mL

## 2023-02-27 MED ORDER — DEXAMETHASONE SODIUM PHOSPHATE 10 MG/ML IJ SOLN
INTRAMUSCULAR | Status: DC | PRN
  Administered 2023-02-27: 10 mg via INTRAVENOUS

## 2023-02-27 MED ORDER — ORAL CARE MOUTH RINSE: 15.0000 mL | Freq: Once | OROMUCOSAL | Status: AC

## 2023-02-27 SURGICAL SUPPLY — 46 items
APPLICATOR DR MATTHEWS STRL (MISCELLANEOUS) ×2 IMPLANT
BETADINE 5% OPHTHALMIC (OPHTHALMIC) ×4 IMPLANT
BLADE SURG 15 STRL LF DISP TIS (BLADE) ×2 IMPLANT
CLEANER TIP ELECTROSURG 2X2 (MISCELLANEOUS) ×2 IMPLANT
CNTNR URN SCR LID CUP LEK RST (MISCELLANEOUS) ×2 IMPLANT
COVER SURGICAL LIGHT HANDLE (MISCELLANEOUS) ×2 IMPLANT
DRAPE HALF SHEET 40X57 (DRAPES) ×2 IMPLANT
DRESSING NASAL POPE 10X1.5X2.5 (GAUZE/BANDAGES/DRESSINGS) ×2 IMPLANT
DRSG NASAL POPE 10X1.5X2.5 (GAUZE/BANDAGES/DRESSINGS)
DRSG TEGADERM 2-3/8X2-3/4 SM (GAUZE/BANDAGES/DRESSINGS) ×2 IMPLANT
DRSG TELFA 3X8 NADH STRL (GAUZE/BANDAGES/DRESSINGS) ×2 IMPLANT
ELECT COATED BLADE 2.86 ST (ELECTRODE) ×2 IMPLANT
ELECT NDL BLADE 2-5/6 (NEEDLE) ×2 IMPLANT
ELECT NEEDLE BLADE 2-5/6 (NEEDLE) ×1
ELECT REM PT RETURN 9FT ADLT (ELECTROSURGICAL) ×1
ELECTRODE REM PT RTRN 9FT ADLT (ELECTROSURGICAL) ×2 IMPLANT
GAUZE 4X4 16PLY ~~LOC~~+RFID DBL (SPONGE) IMPLANT
GLOVE BIO SURGEON STRL SZ7.5 (GLOVE) ×2 IMPLANT
GLOVE BIOGEL PI IND STRL 8 (GLOVE) ×2 IMPLANT
GOWN STRL REUS W/ TWL LRG LVL3 (GOWN DISPOSABLE) ×2 IMPLANT
GOWN STRL REUS W/ TWL XL LVL3 (GOWN DISPOSABLE) ×2 IMPLANT
KIT BASIN OR (CUSTOM PROCEDURE TRAY) ×2 IMPLANT
KIT TURNOVER KIT B (KITS) ×2 IMPLANT
MARKER SKIN DUAL TIP RULER LAB (MISCELLANEOUS) ×2 IMPLANT
NDL PRECISIONGLIDE 27X1.5 (NEEDLE) ×2 IMPLANT
NEEDLE PRECISIONGLIDE 27X1.5 (NEEDLE) ×1
NS IRRIG 1000ML POUR BTL (IV SOLUTION) ×2 IMPLANT
PATTIES SURGICAL .5 X3 (DISPOSABLE) IMPLANT
PENCIL SMOKE EVACUATOR (MISCELLANEOUS) ×2 IMPLANT
POSITIONER HEAD DONUT 9IN (MISCELLANEOUS) ×2 IMPLANT
SHEET SILICONE 2X3 0.03 REINF (MISCELLANEOUS) IMPLANT
SPONGE T-LAP 18X18 ~~LOC~~+RFID (SPONGE) ×2 IMPLANT
STAPLER VISISTAT 35W (STAPLE) ×2 IMPLANT
SUT CHROMIC 5 0 RB 1 27 (SUTURE) IMPLANT
SUT ETHILON 5 0 PS 2 18 (SUTURE) IMPLANT
SUT ETHILON 6 0 9-3 1X18 BLK (SUTURE) IMPLANT
SUT MON AB 5-0 P3 18 (SUTURE) IMPLANT
SUT PROLENE 4 0 PS 2 18 (SUTURE) IMPLANT
SUT SILK 4 0 CR 8 RB 1 (SUTURE) IMPLANT
SUT VIC AB 3-0 X1 27 (SUTURE) IMPLANT
SUT VIC AB 4-0 PS2 18 (SUTURE) IMPLANT
SUT VIC AB 4-0 PS2 27 (SUTURE) IMPLANT
SYR CONTROL 10ML LL (SYRINGE) ×2 IMPLANT
TOWEL GREEN STERILE FF (TOWEL DISPOSABLE) ×2 IMPLANT
TRAY ENT MC OR (CUSTOM PROCEDURE TRAY) ×2 IMPLANT
WATER STERILE IRR 1000ML POUR (IV SOLUTION) ×2 IMPLANT

## 2023-02-27 NOTE — Transfer of Care (Signed)
Immediate Anesthesia Transfer of Care Note  Patient: Melanie Harris  Procedure(s) Performed: DEBULKING OF LEFT NASAL FLAP (Left: Nose)  Patient Location: PACU  Anesthesia Type:General  Level of Consciousness: awake, alert , oriented, and patient cooperative  Airway & Oxygen Therapy: Patient Spontanous Breathing  Post-op Assessment: Report given to RN and Post -op Vital signs reviewed and stable  Post vital signs: Reviewed and stable  Last Vitals:  Vitals Value Taken Time  BP 118/59 02/27/23 0830  Temp 36.4 C 02/27/23 0830  Pulse 74 02/27/23 0832  Resp 15 02/27/23 0832  SpO2 100 % 02/27/23 0832  Vitals shown include unfiled device data.  Last Pain:  Vitals:   02/27/23 0830  TempSrc:   PainSc: 0-No pain         Complications: No notable events documented.

## 2023-02-27 NOTE — Anesthesia Postprocedure Evaluation (Signed)
Anesthesia Post Note  Patient: Melanie Harris  Procedure(s) Performed: DEBULKING OF LEFT NASAL FLAP (Left: Nose)     Patient location during evaluation: PACU Anesthesia Type: General Level of consciousness: awake and alert Pain management: pain level controlled Vital Signs Assessment: post-procedure vital signs reviewed and stable Respiratory status: spontaneous breathing, nonlabored ventilation, respiratory function stable and patient connected to nasal cannula oxygen Cardiovascular status: blood pressure returned to baseline and stable Postop Assessment: no apparent nausea or vomiting Anesthetic complications: no   No notable events documented.  Last Vitals:  Vitals:   02/27/23 0845 02/27/23 0900  BP: 126/63   Pulse: 73 66  Resp: 15 17  Temp:  36.7 C  SpO2: 99% 99%    Last Pain:  Vitals:   02/27/23 0900  TempSrc:   PainSc: 0-No pain                 Parrish Daddario

## 2023-02-27 NOTE — Anesthesia Procedure Notes (Signed)
Procedure Name: Intubation Date/Time: 02/27/2023 7:45 AM  Performed by: Ammie Dalton, CRNAPre-anesthesia Checklist: Patient identified, Emergency Drugs available, Suction available and Patient being monitored Patient Re-evaluated:Patient Re-evaluated prior to induction Oxygen Delivery Method: Circle System Utilized Preoxygenation: Pre-oxygenation with 100% oxygen Induction Type: IV induction Ventilation: Mask ventilation without difficulty Laryngoscope Size: Glidescope and 3 Grade View: Grade I Tube type: Oral Tube size: 7.0 mm Number of attempts: 1 Airway Equipment and Method: Rigid stylet Placement Confirmation: ETT inserted through vocal cords under direct vision, positive ETCO2 and breath sounds checked- equal and bilateral Secured at: 21 cm Tube secured with: Tape Dental Injury: Teeth and Oropharynx as per pre-operative assessment

## 2023-02-27 NOTE — Discharge Instructions (Signed)

## 2023-02-27 NOTE — Op Note (Addendum)
FACIAL PLASTIC SURGERY OPERATIVE NOTE  Melanie Harris Date/Time of Admission: 02/27/2023  5:24 AM  CSN: [id number] Attending Provider: No att. providers found Room/Bed: MCPO/NONE DOB: 09-21-1960 Age: 63 y.o.   Pre-Op Diagnosis: Skin cancer of nose; Acquired nasal deformity  Post-Op Diagnosis: Skin cancer of nose; Acquired nasal deformity  Procedure: Procedure(s): DEBULKING OF LEFT NASAL FLAP   Anesthesia: General  Surgeon(s): Mervin Kung, MD  Staff: Circulator: Betsey Amen T, RN Scrub Person: Perez-Vasquez, Tiffany  Implants: * No implants in log *  Specimens: * No specimens in log *  Complications: NONE  EBL: 5 ML  IVF: Per anesthesia ML  Condition: stable  Operative Findings:  Left nasal reconstruction ala subunit with flap bulk causing complete nasal valve obstruction  Indications for procedure: Melanie Harris is a 63 year old female with a history of extensive nasal reconstruction by plastic surgeon Merry Proud after excision of a nasal skin cancer by Dr. Pollyann Kennedy several years ago.  She had multistage nasal reconstruction including autologous rib graft, forehead flap, nasolabial flaps.  She suffered from multiple infections of her donor site as well as her nose in the setting of active tobacco use.  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  She was requested reconstructive surgery to repair her left nasal obstruction.  RBA discussed.  Informed consent obtained.  Description of Operation: The patient was then notified in the preoperative area and consent confirmed in the chart.  She was brought to the operating room with anesthetist and a preoperative huddle was performed confirming the patient identity and procedure to be performed.  Once all were in agreement we proceeded with surgery.  General anesthesia was induced and the patient was intubated with oral tube.  Tube  was secured and the patient was turned 90 degrees to the anesthetist.  The patient nose was examined with the findings as noted above.  A curvilinear incision along the alar rim was then marked out and infiltrated with 1% lidocaine 1 100,000 epinephrine.  The patient was then prepped and draped in standard sterile fashion for a procedure of this kind.  Final preoperative pause was performed and we proceeded with surgery.  A 15 blade was used to make a curvilinear alar rim incision.  Double-pronged skin hooks were applied and excessive subcutaneous tissue between the neovestibular lining and external tip skin was dissected out sharply with a 15 blade and curved tenotomy scissor.  This was then discarded.  There was good vascularity of the tissues.  A single 5-0 Monocryl buried suture was placed to reapposed the vestibular lining to the external alar skin.  Next the incision was closed with interrupted 5-0 Chromic Gut sutures.  Next a Silastic splint was placed with trans cutaneous 4-0 Prolene sutures.  The area was coated with bacitracin.  The patient was then turned back to the anesthetist who extubated and brought her to the recovery room in stable condition.   Mervin Kung, MD [facility]NT  02/27/2023

## 2023-02-27 NOTE — H&P (Signed)
Melanie Harris is an 63 y.o. female.    Chief Complaint:  Nasal obstruction, acquired nasal deformity  HPI: Patient presents today for planned elective procedure.  He/she denies any interval change in history since office visit on 10/25/23.   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 Pathology negative 06/2016: LSIL/no hpv testing done   Palpitation    hx PVCs   Pure hypercholesterolemia 12/22/2015   Rash 08/21/2011   Smoking 08/12/2013   Xerosis of skin 02/21/2017    Past Surgical History:  Procedure Laterality Date   ABDOMINAL HYSTERECTOMY  2004   COLPOSCOPY  12/12/2021   COLPOSCOPY  01/29/2023   in GYN office procedure   NASAL FLAP ROTATION Left 01/27/2021   Procedure: NASAL FLAP ROTATION;  Surgeon: Allena Napoleon, MD;  Location: [facility] OR;  Service: Plastics;  Laterality: Left;   NASAL FLAP ROTATION Left 10/10/2021   Procedure: Revision left nasal forehead flap with complex closure, possible cartilage graft to left nose;  Surgeon: Janne Napoleon, MD;  Location: [facility];  Service: Plastics;  Laterality: Left;   NASAL RECONSTRUCTION Left 01/27/2021   Procedure: NASAL RECONSTRUCTION;  Surgeon: Allena Napoleon, MD;  Location: [facility] OR;  Service: Plastics;  Laterality: Left;   NASAL RECONSTRUCTION Bilateral 02/21/2021   Procedure: DIVISION AND INSET OF NASAL FLAPS, DEBRIDEMENT RIGHT NASOLABIAL FLAP, REVISION OF INSET RIGHT NASOLABIAL FLAP;  Surgeon: Allena Napoleon, MD;  Location: [facility] OR;  Service: Plastics;  Laterality: Bilateral;   NASAL RECONSTRUCTION Right 03/13/2021   Procedure: Division and Inset forehead flap, with nasal reconstruction  with flaps and grafts as necessary, left rib cartilage graft.;  Surgeon: Allena Napoleon, MD;  Location: [facility];  Service: Plastics;  Laterality: Right;  1.5 hour   RHINOPLASTY Left 01/18/2021   Procedure: PARTIAL RHINECTOMY WITH FROZEN SECTION;  Surgeon: Serena Colonel, MD;  Location: [facility] OR;  Service: ENT;  Laterality: Left;   SKIN FULL THICKNESS GRAFT Left 01/27/2021   Procedure: SKIN GRAFT FULL THICKNESS;  Surgeon: Allena Napoleon, MD;  Location: [facility];  Service: Plastics;  Laterality: Left;   TONSILLECTOMY  [age] yrs old    Family History  Problem Relation Age of Onset   Heart failure Mother        started in her 63's   Hypertension Mother    Hyperlipidemia Mother    Lung cancer Father    Hypertension Father    Hypercholesterolemia Father    Diabetes Sister        diet controlled   Dysrhythmia Sister        possible in the past   Healthy Daughter    Breast cancer Neg Hx     Social History:  reports that she has quit smoking. Her smoking use included cigarettes. She has a 7.5 pack-year smoking history. She has been exposed to tobacco smoke. She has never used smokeless tobacco. She reports current alcohol use of about 1.0 - 2.0 standard drink of alcohol per week. She reports that she does not use drugs.  Allergies:  Allergies  Allergen Reactions   Morphine And  Codeine Nausea And Vomiting   Doxycycline Rash    Medications Prior to Admission  Medication Sig Dispense Refill   amLODipine (NORVASC) 10 MG tablet Take 1 tablet (10 mg total) by mouth daily. 90 tablet 1   atorvastatin (LIPITOR) 20 MG tablet Take 1 tablet (20 mg total) by mouth daily. 90 tablet 1   cholecalciferol (VITAMIN D3) 25 MCG (1000 UNIT) tablet Take 1 tablet (1,000 Units total) by mouth every morning. 12 tablet 0   magnesium oxide (MAG-OX) 400 MG tablet Take 1 tablet (400 mg total) by mouth daily. 90 tablet 3   metoprolol succinate (TOPROL-XL) 100 MG 24 hr tablet Take 1 tablet (100 mg total) by mouth daily. 90  tablet 1    Results for orders placed or performed during the hospital encounter of 02/27/23 (from the past 48 hours)  Basic metabolic panel per protocol     Status: Abnormal   Collection Time: 02/27/23  5:55 AM  Result Value Ref Range   Sodium 138 135 - 145 mmol/L   Potassium 3.9 3.5 - 5.1 mmol/L   Chloride 101 98 - 111 mmol/L   CO2 24 22 - 32 mmol/L   Glucose, Bld 105 (H) 70 - 99 mg/dL    Comment: Glucose reference range applies only to samples taken after fasting for at least 8 hours.   BUN 12 8 - 23 mg/dL   Creatinine, Ser 1.61 0.44 - 1.00 mg/dL   Calcium 9.6 8.9 - 09.6 mg/dL   GFR, Estimated >04 >54 mL/min    Comment: (NOTE) Calculated using the CKD-EPI Creatinine Equation (2021)    Anion gap 13 5 - 15    Comment: Performed at [facility], [address], [place], [place] [address]  CBC per protocol     Status: None   Collection Time: 02/27/23  5:55 AM  Result Value Ref Range   WBC 6.9 4.0 - 10.5 K/uL   RBC 4.14 3.87 - 5.11 MIL/uL   Hemoglobin 13.4 12.0 - 15.0 g/dL   HCT 91.4 78.2 - 95.6 %   MCV 96.6 80.0 - 100.0 fL   MCH 32.4 26.0 - 34.0 pg   MCHC 33.5 30.0 - 36.0 g/dL   RDW 21.3 08.6 - 57.8 %   Platelets 274 150 - 400 K/uL   nRBC 0.0 0.0 - 0.2 %    Comment: Performed at [facility], [address], [place], [place] [address]   No results found.  ROS: negative other than stated in HPI  Blood pressure 117/65, pulse 61, temperature 98.2 F (36.8 C), temperature source Oral, resp. rate 15, height 5\' 7"  (1.702 m), weight 49.9 kg, SpO2 98%.  PHYSICAL EXAM: General: Resting comfortably in NAD  Lungs: Non-labored respiratinos  Studies Reviewed: None   Assessment/Plan Acquired nasal deformity Nasal valve stenosis  Proceed with left nasal flap debulking under GA. RBA discussed.   Electronically signed by:  Scarlette Ar, MD  Staff Physician Facial Plastic & Reconstructive Surgery Otolaryngology - Head and Neck Surgery [facility]  [facility]  02/27/2023, 7:25 AM

## 2023-02-28 ENCOUNTER — Encounter (HOSPITAL_COMMUNITY): Payer: Self-pay | Admitting: Otolaryngology

## 2023-03-23 ENCOUNTER — Other Ambulatory Visit (HOSPITAL_COMMUNITY): Payer: Self-pay

## 2023-03-23 ENCOUNTER — Other Ambulatory Visit: Payer: Self-pay | Admitting: Family Medicine

## 2023-03-23 DIAGNOSIS — I10 Essential (primary) hypertension: Secondary | ICD-10-CM

## 2023-03-23 DIAGNOSIS — E7800 Pure hypercholesterolemia, unspecified: Secondary | ICD-10-CM

## 2023-03-25 ENCOUNTER — Other Ambulatory Visit: Payer: Self-pay

## 2023-03-25 ENCOUNTER — Other Ambulatory Visit (HOSPITAL_COMMUNITY): Payer: Self-pay

## 2023-03-25 MED ORDER — ATORVASTATIN CALCIUM 20 MG PO TABS
20.0000 mg | ORAL_TABLET | Freq: Every day | ORAL | 1 refills | Status: DC
  Filled 2023-03-25: qty 90, 90d supply, fill #0

## 2023-03-25 MED ORDER — AMLODIPINE BESYLATE 10 MG PO TABS
10.0000 mg | ORAL_TABLET | Freq: Every day | ORAL | 1 refills | Status: DC
  Filled 2023-03-25 – 2023-05-22 (×2): qty 90, 90d supply, fill #0

## 2023-03-26 ENCOUNTER — Other Ambulatory Visit: Payer: Self-pay

## 2023-05-22 ENCOUNTER — Other Ambulatory Visit: Payer: Self-pay | Admitting: Family Medicine

## 2023-05-22 ENCOUNTER — Other Ambulatory Visit: Payer: Self-pay

## 2023-05-22 ENCOUNTER — Other Ambulatory Visit (HOSPITAL_COMMUNITY): Payer: Self-pay

## 2023-05-22 DIAGNOSIS — I10 Essential (primary) hypertension: Secondary | ICD-10-CM

## 2023-05-22 MED ORDER — METOPROLOL SUCCINATE ER 100 MG PO TB24
100.0000 mg | ORAL_TABLET | Freq: Every day | ORAL | 0 refills | Status: DC
  Filled 2023-05-22: qty 30, 30d supply, fill #0

## 2023-05-22 MED ORDER — MAGNESIUM OXIDE 400 MG PO TABS
400.0000 mg | ORAL_TABLET | Freq: Every day | ORAL | 0 refills | Status: DC
  Filled 2023-05-22 – 2023-08-21 (×2): qty 30, 30d supply, fill #0

## 2023-05-23 ENCOUNTER — Other Ambulatory Visit (HOSPITAL_COMMUNITY): Payer: Self-pay

## 2023-05-29 ENCOUNTER — Other Ambulatory Visit: Payer: Self-pay

## 2023-05-29 ENCOUNTER — Encounter: Payer: Self-pay | Admitting: Family Medicine

## 2023-05-29 ENCOUNTER — Ambulatory Visit: Payer: Self-pay | Attending: Family Medicine | Admitting: Family Medicine

## 2023-05-29 VITALS — BP 139/67 | HR 70 | Ht 67.0 in | Wt 102.8 lb

## 2023-05-29 DIAGNOSIS — Z1211 Encounter for screening for malignant neoplasm of colon: Secondary | ICD-10-CM

## 2023-05-29 DIAGNOSIS — M950 Acquired deformity of nose: Secondary | ICD-10-CM

## 2023-05-29 DIAGNOSIS — J3489 Other specified disorders of nose and nasal sinuses: Secondary | ICD-10-CM

## 2023-05-29 DIAGNOSIS — E7800 Pure hypercholesterolemia, unspecified: Secondary | ICD-10-CM

## 2023-05-29 DIAGNOSIS — I10 Essential (primary) hypertension: Secondary | ICD-10-CM

## 2023-05-29 DIAGNOSIS — D099 Carcinoma in situ, unspecified: Secondary | ICD-10-CM

## 2023-05-29 DIAGNOSIS — Z23 Encounter for immunization: Secondary | ICD-10-CM

## 2023-05-29 MED ORDER — ATORVASTATIN CALCIUM 20 MG PO TABS
20.0000 mg | ORAL_TABLET | Freq: Every day | ORAL | 1 refills | Status: DC
  Filled 2023-05-29 – 2023-08-21 (×2): qty 90, 90d supply, fill #0
  Filled 2023-11-14: qty 90, 90d supply, fill #1

## 2023-05-29 MED ORDER — AMLODIPINE BESYLATE 10 MG PO TABS
10.0000 mg | ORAL_TABLET | Freq: Every day | ORAL | 1 refills | Status: DC
  Filled 2023-05-29 – 2023-08-21 (×2): qty 90, 90d supply, fill #0
  Filled 2023-11-14: qty 90, 90d supply, fill #1

## 2023-05-29 MED ORDER — METOPROLOL SUCCINATE ER 100 MG PO TB24
100.0000 mg | ORAL_TABLET | Freq: Every day | ORAL | 1 refills | Status: DC
Start: 2023-05-29 — End: 2023-11-28
  Filled 2023-05-29 – 2023-06-24 (×3): qty 90, 90d supply, fill #0
  Filled 2023-09-18 (×2): qty 90, 90d supply, fill #1

## 2023-05-29 NOTE — Progress Notes (Signed)
 Subjective:  Patient ID: Melanie Harris, female    DOB: [birth year]  Age: 63 y.o. MRN: [id number]  CC: Medical Management of Chronic Issues     Discussed the use of AI scribe software for clinical note transcription with the patient, who gave verbal consent to proceed.  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 She underwent a left nasal flap debulk in January and reports improved breathing through the left nostril since the procedure. However, she notes persistent dryness in the left nostril, which she manages with a nasal spray.  The patient also mentions her ENT surgeon states she would need a referral to a dermatologist for a skin check due to her history of squamous cell carcinoma.  The patient's other chronic conditions, including hypertension and hyperlipidemia, are well-managed on amlodipine , metoprolol , and atorvastatin . She has not had breakfast today and consents to lab work. She has not had a colon cancer screening or colonoscopy and agrees to a yearly stool test for colon cancer screening.    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 Pathology negative 06/2016: LSIL/no hpv testing done   Palpitation    hx PVCs   Pure hypercholesterolemia 12/22/2015   Rash 08/21/2011   Smoking 08/12/2013   Xerosis of skin 02/21/2017    Past Surgical History:  Procedure Laterality Date   ABDOMINAL HYSTERECTOMY  2004   COLPOSCOPY   12/12/2021   COLPOSCOPY  01/29/2023   in GYN office procedure   NASAL FLAP ROTATION Left 01/27/2021   Procedure: NASAL FLAP ROTATION;  Surgeon: Barb Bonito, MD;  Location: [facility];  Service: Plastics;  Laterality: Left;   NASAL FLAP ROTATION Left 10/10/2021   Procedure: Revision left nasal forehead flap with complex closure, possible cartilage graft to left nose;  Surgeon: Rodman Clam, MD;  Location: [facility];  Service: Plastics;  Laterality: Left;   NASAL FLAP ROTATION Left 02/27/2023   Procedure: DEBULKING OF LEFT NASAL FLAP;  Surgeon: Rush Coupe, MD;  Location: [facility] OR;  Service: ENT;  Laterality: Left;   NASAL RECONSTRUCTION Left 01/27/2021   Procedure: NASAL RECONSTRUCTION;  Surgeon: Barb Bonito, MD;  [facility];  Service: Plastics;  Laterality: Left;   NASAL RECONSTRUCTION Bilateral 02/21/2021   Procedure: DIVISION AND INSET OF NASAL FLAPS, DEBRIDEMENT RIGHT NASOLABIAL FLAP, REVISION OF INSET RIGHT NASOLABIAL FLAP;  Surgeon: Barb Bonito, MD;  Location: [facility];  Service: Plastics;  Laterality: Bilateral;   NASAL RECONSTRUCTION Right 03/13/2021   Procedure: Division and Inset forehead flap, with nasal reconstruction with flaps and grafts as necessary, left rib cartilage graft.;  Surgeon: Barb Bonito, MD;  Location: [facility] OR;  Service: Plastics;  Laterality: Right;  1.5 hour   RHINOPLASTY Left 01/18/2021   Procedure: PARTIAL RHINECTOMY WITH FROZEN SECTION;  Surgeon: Janita Mellow, MD;  Location: [facility] OR;  Service: ENT;  Laterality: Left;   SKIN FULL THICKNESS GRAFT Left 01/27/2021  Procedure: SKIN GRAFT FULL THICKNESS;  Surgeon: Barb Bonito, MD;  Location: [facility] OR;  Service: Plastics;  Laterality: Left;   TONSILLECTOMY  [age] yrs old    Family History  Problem Relation Age of Onset   Heart failure Mother        started in her 86's   Hypertension Mother    Hyperlipidemia Mother    Lung cancer Father    Hypertension Father    Hypercholesterolemia Father     Diabetes Sister        diet controlled   Dysrhythmia Sister        possible in the past   Healthy Daughter    Breast cancer Neg Hx     Social History   Socioeconomic History   Marital status: Divorced    Spouse name: Not on file   Number of children: 1   Years of education: 1   Highest education level: High school graduate  Occupational History   Not on file  Tobacco Use   Smoking status: Former    Current packs/day: 0.25    Average packs/day: 0.3 packs/day for 30.0 years (7.5 ttl pk-yrs)    Types: Cigarettes    Passive exposure: Past   Smokeless tobacco: Never   Tobacco comments:    4 cigs per day  Vaping Use   Vaping status: Never Used  Substance and Sexual Activity   Alcohol use: Yes    Alcohol/week: 1.0 - 2.0 standard drink of alcohol    Types: 1 - 2 Glasses of wine per week    Comment: occassional wine   Drug use: No   Sexual activity: Not Currently    Birth control/protection: Surgical    Comment: Partial hysterectomy, Uterous only. Pt still has her cervix and ovaries.  Other Topics Concern   Not on file  Social History Narrative   Not on file   Social Drivers of Health   Financial Resource Strain: Low Risk  (11/28/2022)   Overall Financial Resource Strain (CARDIA)    Difficulty of Paying Living Expenses: Not very hard  Food Insecurity: No Food Insecurity (11/28/2022)   Hunger Vital Sign    Worried About Running Out of Food in the Last Year: Never true    Ran Out of Food in the Last Year: Never true  Transportation Needs: No Transportation Needs (10/04/2022)   PRAPARE - Administrator, Civil Service (Medical): No    Lack of Transportation (Non-Medical): No  Physical Activity: Inactive (11/28/2022)   Exercise Vital Sign    Days of Exercise per Week: 0 days    Minutes of Exercise per Session: 0 min  Stress: No Stress Concern Present (11/28/2022)   [employer] of Occupational Health - Occupational Stress Questionnaire    Feeling of  Stress : Not at all  Social Connections: Moderately Isolated (11/28/2022)   Social Connection and Isolation Panel [NHANES]    Frequency of Communication with Friends and Family: Three times a week    Frequency of Social Gatherings with Friends and Family: Once a week    Attends Religious Services: More than 4 times per year    Active Member of [employer] or Organizations: No    Attends Banker Meetings: Never    Marital Status: Divorced    Allergies  Allergen Reactions   Morphine And Codeine Nausea And Vomiting   Doxycycline  Rash    Outpatient Medications Prior to Visit  Medication Sig Dispense Refill   cholecalciferol  (VITAMIN D3)  25 MCG (1000 UNIT) tablet Take 1 tablet (1,000 Units total) by mouth every morning. 12 tablet 0   magnesium  oxide (MAG-OX) 400 MG tablet Take 1 tablet (400 mg total) by mouth daily. 30 tablet 0   amLODipine  (NORVASC ) 10 MG tablet Take 1 tablet (10 mg total) by mouth daily. 90 tablet 1   atorvastatin  (LIPITOR) 20 MG tablet Take 1 tablet (20 mg total) by mouth daily. 90 tablet 1   metoprolol  succinate (TOPROL -XL) 100 MG 24 hr tablet Take 1 tablet (100 mg total) by mouth daily. 30 tablet 0   No facility-administered medications prior to visit.     ROS Review of Systems  Constitutional:  Negative for activity change and appetite change.  HENT:  Negative for sinus pressure and sore throat.   Respiratory:  Negative for chest tightness, shortness of breath and wheezing.   Cardiovascular:  Negative for chest pain and palpitations.  Gastrointestinal:  Negative for abdominal distention, abdominal pain and constipation.  Genitourinary: Negative.   Musculoskeletal: Negative.   Psychiatric/Behavioral:  Negative for behavioral problems and dysphoric mood.     Objective:  BP 139/67   Pulse 70   Ht 5\' 7"  (1.702 m)   Wt 102 lb 12.8 oz (46.6 kg)   SpO2 98%   BMI 16.10 kg/m      05/29/2023    8:38 AM 02/27/2023    8:45 AM 02/27/2023    8:30 AM   BP/Weight  Systolic BP 139 126 118  Diastolic BP 67 63 59  Wt. (Lbs) 102.8    BMI 16.1 kg/m2        Physical Exam Constitutional:      Appearance: She is well-developed.  HENT:     Nose:     Comments: Abnormal left nostril with increased bulk Cardiovascular:     Rate and Rhythm: Normal rate.     Heart sounds: Normal heart sounds. No murmur heard. Pulmonary:     Effort: Pulmonary effort is normal.     Breath sounds: Normal breath sounds. No wheezing or rales.  Chest:     Chest wall: No tenderness.  Abdominal:     General: Bowel sounds are normal. There is no distension.     Palpations: Abdomen is soft. There is no mass.     Tenderness: There is no abdominal tenderness.  Musculoskeletal:        General: Normal range of motion.     Right lower leg: No edema.     Left lower leg: No edema.  Neurological:     Mental Status: She is alert and oriented to person, place, and time.  Psychiatric:        Mood and Affect: Mood normal.        Latest Ref Rng & Units 02/27/2023    5:55 AM 11/28/2022    9:07 AM 05/28/2022    4:44 PM  CMP  Glucose 70 - 99 mg/dL 956  213  88   BUN 8 - 23 mg/dL 12  12  21    Creatinine 0.44 - 1.00 mg/dL 0.86  5.78  4.69   Sodium 135 - 145 mmol/L 138  140  138   Potassium 3.5 - 5.1 mmol/L 3.9  4.3  3.8   Chloride 98 - 111 mmol/L 101  102  99   CO2 22 - 32 mmol/L 24  22  20    Calcium  8.9 - 10.3 mg/dL 9.6  9.7  9.8   Total Protein 6.0 - 8.5 g/dL  7.0  7.3   Total Bilirubin 0.0 - 1.2 mg/dL  0.3  0.4   Alkaline Phos 44 - 121 IU/L  107  119   AST 0 - 40 IU/L  26  46   ALT 0 - 32 IU/L  20  38     Lipid Panel     Component Value Date/Time   CHOL 198 11/28/2022 0907   TRIG 60 11/28/2022 0907   HDL 108 11/28/2022 0907   CHOLHDL 1.8 06/09/2020 1625   CHOLHDL 1.7 12/21/2015 1230   VLDL 14 12/21/2015 1230   LDLCALC 79 11/28/2022 0907    CBC    Component Value Date/Time   WBC 6.9 02/27/2023 0555   RBC 4.14 02/27/2023 0555   HGB 13.4 02/27/2023  0555   HGB 13.1 05/28/2022 1644   HCT 40.0 02/27/2023 0555   HCT 40.1 05/28/2022 1644   PLT 274 02/27/2023 0555   PLT 258 05/28/2022 1644   MCV 96.6 02/27/2023 0555   MCV 96 05/28/2022 1644   MCH 32.4 02/27/2023 0555   MCHC 33.5 02/27/2023 0555   RDW 13.2 02/27/2023 0555   RDW 12.8 05/28/2022 1644   LYMPHSABS 2.1 05/28/2022 1644   MONOABS 0.7 10/30/2016 1222   EOSABS 0.1 05/28/2022 1644   BASOSABS 0.1 05/28/2022 1644    Lab Results  Component Value Date   HGBA1C 5.6 11/28/2022       Assessment & Plan Screening for colon cancer Due for colon cancer screening. Discussed stool test as an alternative to colonoscopy due to lack of medical coverage. - Order stool test for colon cancer screening. - Order routine labs.  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 - Refer to [facility] dermatologist for skin cancer screening.  Nasal dryness post-surgery Nasal dryness following left nasal flap debulk surgery in January. Managed with nasal spray. - Continue nasal spray as needed.  Hypertension Blood pressure is well-controlled with current medication regimen. - Continue amlodipine  10 mg daily. - Continue metoprolol  succinate 100 mg daily. -Counseled on blood pressure goal of less than 130/80, low-sodium, DASH diet, medication compliance, 150 minutes of moderate intensity exercise per week. Discussed medication compliance, adverse effects.   Pure hypercholesterolemia Cholesterol management with atorvastatin . - Continue atorvastatin  20 mg daily. - Low-cholesterol diet      Meds ordered this encounter  Medications   amLODipine  (NORVASC ) 10 MG tablet    Sig: Take 1 tablet (10 mg total) by mouth daily.    Dispense:  90 tablet    Refill:  1   atorvastatin  (LIPITOR) 20 MG tablet    Sig: Take 1 tablet (20 mg total) by mouth daily.    Dispense:  90 tablet     Refill:  1   metoprolol  succinate (TOPROL -XL) 100 MG 24 hr tablet    Sig: Take 1 tablet (100 mg total) by mouth daily.    Dispense:  90 tablet    Refill:  1    Follow-up: Return in about 6 months (around 11/28/2023) for Chronic medical conditions.       Joaquin Mulberry, MD, FAAFP. [facility] [place], [place] [phone]   05/29/2023, 8:56 AM

## 2023-05-29 NOTE — Patient Instructions (Signed)
 VISIT SUMMARY:  You came in today for a follow-up visit after your nasal surgery and a routine wellness check. Your breathing has improved since the surgery, but you are experiencing some dryness in your left nostril, which you are managing with a nasal spray. We also discussed your history of squamous cell carcinoma and the need for regular skin checks. Your blood pressure and cholesterol are well-managed with your current medications.  YOUR PLAN:  -WELLNESS VISIT: This was a routine wellness visit where we checked your overall health. Your blood pressure is well-controlled. You are due for colon cancer screening, and we discussed using a stool test as an alternative to a colonoscopy due to lack of medical coverage. Routine labs will also be ordered.  -SQUAMOUS CELL CARCINOMA OF THE NOSE: Squamous cell carcinoma is a type of skin cancer. You have a history of this condition in your left nostril. We recommend follow-up visits with an ENT specialist and a dermatologist for regular surveillance. A referral to a [facility] dermatologist for a skin cancer screening has been made.  -NASAL DRYNESS POST-SURGERY: You are experiencing dryness in your left nostril following your nasal surgery in January. This is being managed with a nasal spray. Continue using the nasal spray as needed.  -HYPERTENSION: Hypertension means high blood pressure. Your blood pressure is well-controlled with your current medications. Continue taking amlodipine  10 mg daily and metoprolol  succinate 100 mg daily.  -PURE HYPERCHOLESTEROLEMIA: Hypercholesterolemia means having high cholesterol levels. Your cholesterol is being managed with atorvastatin . Continue taking atorvastatin  20 mg daily.  INSTRUCTIONS:  Please follow up with the ENT specialist and the dermatologist as recommended. Complete the stool test for colon cancer screening and have your routine labs done. Continue taking your prescribed medications as directed.

## 2023-05-30 ENCOUNTER — Encounter: Payer: Self-pay | Admitting: Family Medicine

## 2023-05-30 LAB — LP+NON-HDL CHOLESTEROL
Cholesterol, Total: 176 mg/dL (ref 100–199)
HDL: 99 mg/dL (ref >39)
LDL Chol Calc (NIH): 64 mg/dL (ref 0–99)
Total Non-HDL-Chol (LDL+VLDL): 77 mg/dL (ref 0–129)
Triglycerides: 67 mg/dL (ref 0–149)
VLDL Cholesterol Cal: 13 mg/dL (ref 5–40)

## 2023-05-30 LAB — CMP14+EGFR
ALT: 26 IU/L (ref 0–32)
AST: 30 IU/L (ref 0–40)
Albumin: 4.9 g/dL (ref 3.9–4.9)
Alkaline Phosphatase: 116 IU/L (ref 44–121)
BUN/Creatinine Ratio: 22 (ref 12–28)
BUN: 14 mg/dL (ref 8–27)
Bilirubin Total: 0.5 mg/dL (ref 0.0–1.2)
CO2: 23 mmol/L (ref 20–29)
Calcium: 9.7 mg/dL (ref 8.7–10.3)
Chloride: 100 mmol/L (ref 96–106)
Creatinine, Ser: 0.65 mg/dL (ref 0.57–1.00)
Globulin, Total: 2.2 g/dL (ref 1.5–4.5)
Glucose: 99 mg/dL (ref 70–99)
Potassium: 4.4 mmol/L (ref 3.5–5.2)
Sodium: 141 mmol/L (ref 134–144)
Total Protein: 7.1 g/dL (ref 6.0–8.5)
eGFR: 100 mL/min/{1.73_m2} (ref >59)

## 2023-06-24 ENCOUNTER — Other Ambulatory Visit (HOSPITAL_COMMUNITY): Payer: Self-pay

## 2023-06-24 ENCOUNTER — Other Ambulatory Visit: Payer: Self-pay

## 2023-06-28 ENCOUNTER — Ambulatory Visit: Payer: Self-pay

## 2023-06-28 ENCOUNTER — Ambulatory Visit (HOSPITAL_COMMUNITY)
Admission: EM | Admit: 2023-06-28 | Discharge: 2023-06-28 | Disposition: A | Discharge status: Home / Self Care | Payer: Self-pay

## 2023-06-28 ENCOUNTER — Encounter (HOSPITAL_COMMUNITY): Payer: Self-pay | Admitting: *Deleted

## 2023-06-28 DIAGNOSIS — R21 Rash and other nonspecific skin eruption: Secondary | ICD-10-CM

## 2023-06-28 DIAGNOSIS — L239 Allergic contact dermatitis, unspecified cause: Secondary | ICD-10-CM

## 2023-06-28 DIAGNOSIS — H02849 Edema of unspecified eye, unspecified eyelid: Secondary | ICD-10-CM

## 2023-06-28 MED ORDER — DEXAMETHASONE SODIUM PHOSPHATE 10 MG/ML IJ SOLN
INTRAMUSCULAR | Status: AC
  Filled 2023-06-28: qty 1

## 2023-06-28 MED ORDER — DEXAMETHASONE SODIUM PHOSPHATE 10 MG/ML IJ SOLN
10.0000 mg | Freq: Once | INTRAMUSCULAR | Status: AC
  Administered 2023-06-28: 10 mg via INTRAMUSCULAR

## 2023-06-28 MED ORDER — TRIAMCINOLONE ACETONIDE 0.1 % EX CREA: 1.0000 | TOPICAL_CREAM | Freq: Two times a day (BID) | CUTANEOUS | 0 refills | Status: DC

## 2023-06-28 MED ORDER — PREDNISONE 10 MG (21) PO TBPK
ORAL_TABLET | Freq: Every day | ORAL | 0 refills | Status: DC
Start: 2023-06-28 — End: 2023-11-28

## 2023-06-28 NOTE — ED Provider Notes (Signed)
 [facility]    CSN: [id number] Arrival date & time: 06/28/23  1817      History   Chief Complaint Chief Complaint  Patient presents with   Allergic Reaction   Rash    HPI Melanie Harris is a 63 y.o. female.   Patient presents with rash that began on 5/18.  Patient states that the rash initially began on her feet and then began to spread of her legs.  Patient reports that the rash is itchy.  Patient states that she has started to have some itching to her arms as well.  Patient states that she woke up this morning and her eyelids were a little swollen and mildly itchy as well.  Denies any visual disturbances.  Patient reports that she has been taking Zyrtec with relief of itching.  Patient states that she has been applying hydrocortisone cream to the rash, but the rash continues to become worse.  Patient states she is unsure of any known exposures to allergens.  Patient states that she did start using a new dryer sheet and thinks that this may be related.  Patient denies any rash to trunk.    The history is provided by the patient and medical records.  Allergic Reaction Presenting symptoms: rash   Rash   Past Medical History:  Diagnosis Date   Abscess of right thigh 07/25/2011   Abscess of thigh    right   Anxiety state 12/21/2015   Cancer (HCC)    squamous cell on nose   Cough 08/12/2013   Hypertension    Low grade squamous intraepithelial lesion (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 07/03/2016   Pure hypercholesterolemia 12/22/2015   Anxiety state 12/21/2015   Other emphysema (HCC) 08/12/2013   Cough 08/12/2013   Smoking 08/12/2013   Essential hypertension 08/12/2013   Rash 08/21/2011   Abscess of thigh    Abscess of right thigh 07/25/2011    Past Surgical History:  Procedure Laterality Date   ABDOMINAL HYSTERECTOMY  2004   COLPOSCOPY  12/12/2021   COLPOSCOPY  01/29/2023   in GYN office procedure   NASAL FLAP ROTATION Left 01/27/2021   Procedure: NASAL FLAP ROTATION;  Surgeon: Barb Bonito, MD;  Location: [facility];  Service: Plastics;  Laterality: Left;   NASAL FLAP ROTATION Left 10/10/2021   Procedure: Revision left nasal forehead flap with complex closure, possible cartilage graft to left nose;  Surgeon: Rodman Clam, MD;  Location: [facility];  Service: Plastics;  Laterality: Left;   NASAL FLAP ROTATION Left 02/27/2023   Procedure: DEBULKING OF LEFT NASAL FLAP;  Surgeon: Rush Coupe, MD;  Location: [facility] OR;  Service: ENT;  Laterality: Left;   NASAL RECONSTRUCTION Left 01/27/2021   Procedure: NASAL RECONSTRUCTION;  Surgeon: Barb Bonito, MD;  Location: [facility];  Service: Plastics;  Laterality: Left;  NASAL RECONSTRUCTION Bilateral 02/21/2021   Procedure: DIVISION AND INSET OF NASAL FLAPS, DEBRIDEMENT RIGHT NASOLABIAL FLAP, REVISION OF INSET RIGHT NASOLABIAL FLAP;  Surgeon: Barb Bonito, MD;  Location: [facility];  Service: Plastics;  Laterality: Bilateral;   NASAL RECONSTRUCTION Right 03/13/2021   Procedure: Division and Inset forehead flap, with nasal reconstruction with flaps and grafts as necessary, left rib cartilage graft.;  Surgeon: Barb Bonito, MD;  Location: [facility];  Service: Plastics;  Laterality: Right;  1.5 hour   RHINOPLASTY Left 01/18/2021   Procedure: PARTIAL RHINECTOMY WITH FROZEN SECTION;   Surgeon: Janita Mellow, MD;  Location: [facility] OR;  Service: ENT;  Laterality: Left;   SKIN FULL THICKNESS GRAFT Left 01/27/2021   Procedure: SKIN GRAFT FULL THICKNESS;  Surgeon: Barb Bonito, MD;  Location: [facility];  Service: Plastics;  Laterality: Left;   TONSILLECTOMY  10-11 yrs old    OB History     Gravida  1   Para  1   Term      Preterm      AB      Living  1      SAB      IAB      Ectopic      Multiple      Live Births  1            Home Medications    Prior to Admission medications   Medication Sig Start Date End Date Taking? Authorizing Provider  amLODipine  (NORVASC ) 10 MG tablet Take 1 tablet (10 mg total) by mouth daily. 05/29/23  Yes Newlin, Enobong, MD  atorvastatin  (LIPITOR) 20 MG tablet Take 1 tablet (20 mg total) by mouth daily. 05/29/23  Yes Newlin, Enobong, MD  cetirizine (ZYRTEC) 10 MG tablet Take 10 mg by mouth daily.   Yes [provider]  cholecalciferol  (VITAMIN D3) 25 MCG (1000 UNIT) tablet Take 1 tablet (1,000 Units total) by mouth every morning. 12/28/22  Yes Newlin, Enobong, MD  magnesium  oxide (MAG-OX) 400 MG tablet Take 1 tablet (400 mg total) by mouth daily. 05/22/23  Yes Newlin, Enobong, MD  metoprolol  succinate (TOPROL -XL) 100 MG 24 hr tablet Take 1 tablet (100 mg total) by mouth daily. 05/29/23  Yes Newlin, Lavelle Posey, MD  predniSONE  (STERAPRED UNI-PAK 21 TAB) 10 MG (21) TBPK tablet Take by mouth daily. Take 6 tabs by mouth daily  for 2 days, then 5 tabs for 2 days, then 4 tabs for 2 days, then 3 tabs for 2 days, 2 tabs for 2 days, then 1 tab by mouth daily for 2 days 06/28/23  Yes Rosevelt Constable, Donita Furrow A, NP  triamcinolone  cream (KENALOG ) 0.1 % Apply 1 Application topically 2 (two) times daily. 06/28/23  Yes Rosevelt Constable, Nealie Mchatton A, NP  mupirocin  nasal ointment (BACTROBAN ) 2 % Place 1 application into the nose 2 (two) times daily. Use one-half of tube in each nostril twice daily for five (5) days. After application, press sides of nose together  and gently massage. 06/09/20 06/10/20  Hassie Lint, PA-C    Family History Family History  Problem Relation Age of Onset   Heart failure Mother        started in her 23's   Hypertension Mother    Hyperlipidemia Mother    Lung cancer Father    Hypertension Father    Hypercholesterolemia Father    Diabetes Sister        diet controlled   Dysrhythmia Sister        possible  in the past   Healthy Daughter    Breast cancer Neg Hx     Social History Social History   Tobacco Use   Smoking status: Former    Current packs/day: 0.25    Average packs/day: 0.3 packs/day for 30.0 years (7.5 ttl pk-yrs)    Types: Cigarettes    Passive exposure: Past   Smokeless tobacco: Never   Tobacco comments:    4 cigs per day  Vaping Use   Vaping status: Never Used  Substance Use Topics   Alcohol use: Yes    Alcohol/week: 1.0 - 2.0 standard drink of alcohol    Types: 1 - 2 Glasses of wine per week    Comment: occassional wine   Drug use: No     Allergies   Morphine and codeine and Doxycycline    Review of Systems Review of Systems  Skin:  Positive for rash.   Per HPI  Physical Exam Triage Vital Signs ED Triage Vitals  Encounter Vitals Group     BP 06/28/23 1855 (!) 145/77     Systolic BP Percentile --      Diastolic BP Percentile --      Pulse Rate 06/28/23 1855 69     Resp 06/28/23 1855 18     Temp 06/28/23 1855 98.1 F (36.7 C)     Temp Source 06/28/23 1855 Oral     SpO2 06/28/23 1855 95 %     Weight --      Height --      Head Circumference --      Peak Flow --      Pain Score 06/28/23 1854 0     Pain Loc --      Pain Education --      Exclude from Growth Chart --    No data found.  Updated Vital Signs BP (!) 145/77 (BP Location: Right Arm)   Pulse 69   Temp 98.1 F (36.7 C) (Oral)   Resp 18   SpO2 95%   Visual Acuity Right Eye Distance: could not see eye chart at all (corrected) Left Eye Distance: 20/30 (corrected) Bilateral Distance: 20/30  (corrected)  Right Eye Near:   Left Eye Near:    Bilateral Near:     Physical Exam Vitals and nursing note reviewed.  Constitutional:      General: She is awake. She is not in acute distress.    Appearance: Normal appearance. She is well-developed and well-groomed. She is not ill-appearing.  Eyes:     Comments: Mild swelling and erythema noted to bilateral upper eyelids.  Skin:    General: Skin is warm and dry.     Findings: Rash present. Rash is macular and papular.     Comments: Erythematous maculopapular rash noted to bilateral feet and lower legs that appears to be spreading up her legs.  Erythematous area noted just below chin as well.  Neurological:     Mental Status: She is alert.  Psychiatric:        Behavior: Behavior is cooperative.      UC Treatments / Results  Labs (all labs ordered are listed, but only abnormal results are displayed) Labs Reviewed - No data to display  EKG   Radiology No results found.  Procedures Procedures (including critical care time)  Medications Ordered in UC Medications  dexamethasone  (DECADRON ) injection 10 mg (10 mg Intramuscular Given 06/28/23 1910)    Initial Impression / Assessment and Plan / UC Course  I have  reviewed the triage vital signs and the nursing notes.  Pertinent labs & imaging results that were available during my care of the patient were reviewed by me and considered in my medical decision making (see chart for details).     Patient is well-appearing.  Vitals are stable.  There is an erythematous maculopapular rash noted to bilateral feet and lower legs that appears to be spreading up her legs.  There is also erythematous area noted just below her chin as well.  There is mild swelling and erythema noted to bilateral upper lower eyelids.  Given IM Decadron  in clinic.  Prescribed prednisone  Dosepak.  Prescribed triamcinolone  cream.  Discussed use of over-the-counter antihistamines.  Given information for allergy  specialist if needed.  Discussed follow-up, return, and strict ER precautions. Final Clinical Impressions(s) / UC Diagnoses   Final diagnoses:  Allergic contact dermatitis, unspecified trigger  Rash and nonspecific skin eruption  Swelling of eyelid, unspecified laterality     Discharge Instructions      You received a steroid injection here in clinic today to help with your rash and possible allergic reaction. Start taking prednisone  Dosepak as instructed to on the box starting tomorrow. I have also prescribed triamcinolone  cream that you can apply twice daily to the rash.  Avoid applying this to your face and neck as this could cause skin bleaching. Continue to take Zyrtec to help with itching. I have attached an allergy specialist that you can follow-up with for allergy testing if needed Otherwise follow-up with primary care or return here as needed. If you develop worsening rash, swelling, or trouble breathing please seek immediate medical treatment in the emergency department.  ED Prescriptions     Medication Sig Dispense Auth. Provider   predniSONE  (STERAPRED UNI-PAK 21 TAB) 10 MG (21) TBPK tablet Take by mouth daily. Take 6 tabs by mouth daily  for 2 days, then 5 tabs for 2 days, then 4 tabs for 2 days, then 3 tabs for 2 days, 2 tabs for 2 days, then 1 tab by mouth daily for 2 days 42 tablet Rosevelt Constable, Betha Shadix A, NP   triamcinolone  cream (KENALOG ) 0.1 % Apply 1 Application topically 2 (two) times daily. 30 g Levora Reas A, NP      PDMP not reviewed this encounter.   Levora Reas A, NP 06/28/23 1911

## 2023-06-28 NOTE — Discharge Instructions (Addendum)
 You received a steroid injection here in clinic today to help with your rash and possible allergic reaction. Start taking prednisone  Dosepak as instructed to on the box starting tomorrow. I have also prescribed triamcinolone  cream that you can apply twice daily to the rash.  Avoid applying this to your face and neck as this could cause skin bleaching. Continue to take Zyrtec to help with itching. I have attached an allergy specialist that you can follow-up with for allergy testing if needed Otherwise follow-up with primary care or return here as needed. If you develop worsening rash, swelling, or trouble breathing please seek immediate medical treatment in the emergency department.

## 2023-06-28 NOTE — Telephone Encounter (Signed)
  Chief Complaint: rash Symptoms: red and colorless bumps progressing from feet to face.  Eyes swollen today Frequency: 5 days Pertinent Negatives: Patient denies fever Disposition: [] ED /[x] Urgent Care (no appt availability in office) / [] Appointment(In office/virtual)/ []  [place]/ [] Home Care/ [] Refused Recommended Disposition /[] [facility]/ []  Follow-up with PCP Additional Notes: started Sunday on top of feet and has progressed up leg and arms and today her eyes are swollen.  She has been using zyrtec and cortisone cream with no relief Advised to seek care at [facility]  \Copied from CRM [id number]. Topic: Clinical - Red Word Triage >> Jun 28, 2023 11:45 AM Leory Rands wrote: Red Word that prompted transfer to Nurse Triage: patient is calling to report rash all over her body with eye swollen. First started last Sunday on feet, now on legs, chin, arms. Reason for Disposition  SEVERE itching (i.e., interferes with sleep, normal activities or school)  Answer Assessment - Initial Assessment Questions 1. APPEARANCE of RASH: "Describe the rash." (e.g., spots, blisters, raised areas, skin peeling, scaly)     Redness with bumps Bumps without redness on chin  3. LOCATION: "Where is the rash located?"     Wide spread  4. COLOR: "What color is the rash?" (Note: It is difficult to assess rash color in people with darker-colored skin. When this situation occurs, simply ask the caller to describe what they see.)     red 5. ONSET: "When did the rash begin?"     Sunday 6. FEVER: "Do you have a fever?" If Yes, ask: "What is your temperature, how was it measured, and when did it start?"     no 7. ITCHING: "Does the rash itch?" If Yes, ask: "How bad is the itch?" (Scale 1-10; or mild, moderate, severe)     Mild itching 8. CAUSE: "What do you think is causing the rash?"     Changed dryer sheets, but otherwise unknown 9. MEDICINE FACTORS: "Have you started any new medicines within the  last 2 weeks?" (e.g., antibiotics)      no 10. OTHER SYMPTOMS: "Do you have any other symptoms?" (e.g., dizziness, headache, sore throat, joint pain)       no  Protocols used: Rash or Redness - [facility]

## 2023-06-28 NOTE — ED Triage Notes (Signed)
 Pt states she has had an allergic reaction since Sunday. She has been taking zyrtec and using hydrocortisone cream with some relief. She has a rash all over her body starting at her feet, she also woke up this morning with swollen eye lids.

## 2023-07-02 NOTE — Telephone Encounter (Signed)
 Noted

## 2023-08-21 ENCOUNTER — Other Ambulatory Visit: Payer: Self-pay

## 2023-09-05 ENCOUNTER — Other Ambulatory Visit: Payer: Self-pay | Admitting: Family Medicine

## 2023-09-05 DIAGNOSIS — Z1231 Encounter for screening mammogram for malignant neoplasm of breast: Secondary | ICD-10-CM

## 2023-09-19 ENCOUNTER — Other Ambulatory Visit (HOSPITAL_COMMUNITY): Payer: Self-pay

## 2023-09-24 ENCOUNTER — Encounter: Payer: Self-pay | Admitting: Dermatology

## 2023-09-24 ENCOUNTER — Ambulatory Visit: Payer: Self-pay | Admitting: Dermatology

## 2023-09-24 VITALS — BP 121/67 | HR 80

## 2023-09-24 DIAGNOSIS — L578 Other skin changes due to chronic exposure to nonionizing radiation: Secondary | ICD-10-CM

## 2023-09-24 DIAGNOSIS — D1801 Hemangioma of skin and subcutaneous tissue: Secondary | ICD-10-CM

## 2023-09-24 DIAGNOSIS — L259 Unspecified contact dermatitis, unspecified cause: Secondary | ICD-10-CM

## 2023-09-24 DIAGNOSIS — D492 Neoplasm of unspecified behavior of bone, soft tissue, and skin: Secondary | ICD-10-CM

## 2023-09-24 DIAGNOSIS — D485 Neoplasm of uncertain behavior of skin: Secondary | ICD-10-CM

## 2023-09-24 DIAGNOSIS — W908XXA Exposure to other nonionizing radiation, initial encounter: Secondary | ICD-10-CM

## 2023-09-24 DIAGNOSIS — D2339 Other benign neoplasm of skin of other parts of face: Secondary | ICD-10-CM

## 2023-09-24 DIAGNOSIS — L814 Other melanin hyperpigmentation: Secondary | ICD-10-CM

## 2023-09-24 DIAGNOSIS — D229 Melanocytic nevi, unspecified: Secondary | ICD-10-CM

## 2023-09-24 DIAGNOSIS — Z1283 Encounter for screening for malignant neoplasm of skin: Secondary | ICD-10-CM

## 2023-09-24 DIAGNOSIS — L821 Other seborrheic keratosis: Secondary | ICD-10-CM

## 2023-09-24 DIAGNOSIS — Z85828 Personal history of other malignant neoplasm of skin: Secondary | ICD-10-CM

## 2023-09-24 DIAGNOSIS — L255 Unspecified contact dermatitis due to plants, except food: Secondary | ICD-10-CM

## 2023-09-24 MED ORDER — TRIAMCINOLONE ACETONIDE 0.1 % EX OINT: 1.0000 | TOPICAL_OINTMENT | Freq: Every day | CUTANEOUS | 0 refills | Status: DC | PRN

## 2023-09-24 NOTE — Progress Notes (Signed)
 New Patient Visit   Subjective  Melanie Harris is a 63 y.o. female who presents for the following: Skin Cancer Screening and Full Body Skin Exam  The patient presents for Total-Body Skin Exam (TBSE) for skin cancer screening and mole check. The patient has spots, moles and lesions to be evaluated, some may be new or changing.  Hx of SCC on nose, treated via WLE with Plastics/ENT, s/p PMFF and several revisions.  The following portions of the chart were reviewed this encounter and updated as appropriate: medications, allergies, medical history  Review of Systems:  No other skin or systemic complaints except as noted in HPI or Assessment and Plan.  Objective  Well appearing patient in no apparent distress; mood and affect are within normal limits.  A full examination was performed including scalp, head, eyes, ears, nose, lips, neck, chest, axillae, abdomen, back, buttocks, bilateral upper extremities, bilateral lower extremities, hands, feet, fingers, toes, fingernails, and toenails. All findings within normal limits unless otherwise noted below.   Relevant physical exam findings are noted in the Assessment and Plan.  Right Malar Cheek 4mm pink papule   Assessment & Plan   SKIN CANCER SCREENING PERFORMED TODAY.  ACTINIC DAMAGE - Chronic condition, secondary to cumulative UV/sun exposure - diffuse scaly erythematous macules with underlying dyspigmentation - Recommend daily broad spectrum sunscreen SPF 30+ to sun-exposed areas, reapply every 2 hours as needed.  - Staying in the shade or wearing long sleeves, sun glasses (UVA+UVB protection) and wide brim hats (4-inch brim around the entire circumference of the hat) are also recommended for sun protection.  - Call for new or changing lesions.  LENTIGINES, SEBORRHEIC KERATOSES, HEMANGIOMAS - Benign normal skin lesions - Benign-appearing - Call for any changes  MELANOCYTIC NEVI - Tan-brown and/or pink-flesh-colored  symmetric macules and papules - Benign appearing on exam today - Observation - Call clinic for new or changing moles - Recommend daily use of broad spectrum spf 30+ sunscreen to sun-exposed areas.   LENTIGINES Exam: scattered tan macules Due to sun exposure Treatment Plan: Benign-appearing, observe. Recommend daily broad spectrum sunscreen SPF 30+ to sun-exposed areas, reapply every 2 hours as needed.  Call for any changes   HEMANGIOMA Exam: red papule(s) Discussed benign nature. Recommend observation. Call for changes.   SEBORRHEIC KERATOSIS - Stuck-on, waxy, tan-brown papules and/or plaques  - Benign-appearing - Discussed benign etiology and prognosis. 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  - Call if any new or changing lesions are noted between office visits  CONTACT DERMATITIS- dorsal hand Exam: scaly pink papules and/or plaques +/- vesiculation  Chronic and persistent condition with duration or expected duration over one year. Condition is bothersome/symptomatic for patient. Currently flared.   Treatment Plan: Triamcinolone  to affected area bid prn NEOPLASM OF UNCERTAIN BEHAVIOR OF SKIN Right Malar Cheek Skin / nail biopsy Type of biopsy: tangential   Informed consent: discussed and consent obtained   Timeout: patient name, date of birth, surgical site, and procedure verified   Procedure prep:  Patient was prepped and draped in usual sterile fashion Prep type:  Chlorhexidine  Anesthesia: the lesion was anesthetized in a standard fashion   Anesthetic:  1% lidocaine  w/ epinephrine  1-100,000 buffered w/ 8.4% NaHCO3 Instrument  used: DermaBlade   Hemostasis achieved with: suture, pressure and electrodesiccation    Outcome: patient tolerated procedure well   Post-procedure details: sterile dressing applied and wound care instructions given   Dressing type: petrolatum  and pressure dressing    Specimen 1 - Surgical pathology Differential Diagnosis: R/O NMSC  Check Margins: No ACTINIC SKIN DAMAGE   LENTIGINES   CHERRY ANGIOMA   MULTIPLE BENIGN NEVI   SEBORRHEIC KERATOSES   HISTORY OF SCC (SQUAMOUS CELL CARCINOMA) OF SKIN   CONTACT DERMATITIS DUE TO PLANTS, EXCEPT FOOD, UNSPECIFIED CONTACT DERMATITIS TYPE    Return in 6 months (on 03/26/2024) for dependant on bx  results.  I, Darice Smock, CMA, am acting as scribe for RUFUS CHRISTELLA HOLY, MD.   Documentation: I have reviewed the above documentation for accuracy and completeness, and I agree with the above.  RUFUS CHRISTELLA HOLY, MD

## 2023-09-24 NOTE — Patient Instructions (Addendum)

## 2023-09-25 LAB — SURGICAL PATHOLOGY

## 2023-09-27 ENCOUNTER — Ambulatory Visit: Payer: Self-pay | Admitting: Dermatology

## 2023-10-10 ENCOUNTER — Ambulatory Visit
Admission: RE | Admit: 2023-10-10 | Discharge: 2023-10-10 | Disposition: A | Discharge status: Home / Self Care | Payer: Self-pay | Source: Ambulatory Visit | Attending: Family Medicine | Admitting: Family Medicine

## 2023-10-10 DIAGNOSIS — Z1231 Encounter for screening mammogram for malignant neoplasm of breast: Secondary | ICD-10-CM

## 2023-10-16 ENCOUNTER — Ambulatory Visit: Payer: Self-pay | Admitting: Family Medicine

## 2023-11-14 ENCOUNTER — Other Ambulatory Visit: Payer: Self-pay

## 2023-11-27 ENCOUNTER — Telehealth: Payer: Self-pay | Admitting: Family Medicine

## 2023-11-27 NOTE — Telephone Encounter (Signed)
 Pt confirmed appt (per vr)

## 2023-11-28 ENCOUNTER — Other Ambulatory Visit: Payer: Self-pay

## 2023-11-28 ENCOUNTER — Encounter: Payer: Self-pay | Admitting: Family Medicine

## 2023-11-28 ENCOUNTER — Ambulatory Visit: Payer: Self-pay | Attending: Family Medicine | Admitting: Family Medicine

## 2023-11-28 VITALS — BP 128/68 | HR 71 | Resp 18 | Wt 106.6 lb

## 2023-11-28 DIAGNOSIS — Z23 Encounter for immunization: Secondary | ICD-10-CM

## 2023-11-28 DIAGNOSIS — I10 Essential (primary) hypertension: Secondary | ICD-10-CM

## 2023-11-28 DIAGNOSIS — J3489 Other specified disorders of nose and nasal sinuses: Secondary | ICD-10-CM

## 2023-11-28 DIAGNOSIS — E7800 Pure hypercholesterolemia, unspecified: Secondary | ICD-10-CM

## 2023-11-28 MED ORDER — AMLODIPINE BESYLATE 10 MG PO TABS
10.0000 mg | ORAL_TABLET | Freq: Every day | ORAL | 1 refills | Status: AC
  Filled 2023-11-28: qty 90, 90d supply, fill #0

## 2023-11-28 MED ORDER — MAGNESIUM OXIDE 400 MG PO TABS
400.0000 mg | ORAL_TABLET | Freq: Every day | ORAL | 1 refills | Status: AC
  Filled 2023-11-28: qty 90, 90d supply, fill #0

## 2023-11-28 MED ORDER — METOPROLOL SUCCINATE ER 100 MG PO TB24
100.0000 mg | ORAL_TABLET | Freq: Every day | ORAL | 1 refills | Status: AC
  Filled 2023-11-28 – 2023-12-17 (×2): qty 90, 90d supply, fill #0

## 2023-11-28 MED ORDER — ATORVASTATIN CALCIUM 20 MG PO TABS
20.0000 mg | ORAL_TABLET | Freq: Every day | ORAL | 1 refills | Status: DC
  Filled 2023-11-28: qty 90, 90d supply, fill #0

## 2023-11-28 NOTE — Progress Notes (Signed)
 No questions

## 2023-11-28 NOTE — Progress Notes (Signed)
 Subjective:  Patient ID: Melanie Harris, female    DOB: 07/24/1960  Age: 63 y.o. MRN: [id number]  CC: Hypertension     Discussed the use of AI scribe software for clinical note transcription with the patient, who gave verbal consent to proceed.  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  She experiences persistent dryness in her left nostril. Her ENT specialist previously recommended saline spray, which has provided minimal relief.  Her current medications include amlodipine  and metoprolol  for hypertension, atorvastatin  for hypercholesterolemia, and a prescription magnesium  supplement.  Denies presence of additional concerns today.   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 Pathology negative 06/2016: LSIL/no hpv testing done   Palpitation    hx PVCs   Pure hypercholesterolemia 12/22/2015   Rash 08/21/2011   Smoking 08/12/2013   Xerosis of skin 02/21/2017    Past Surgical History:  Procedure Laterality Date   ABDOMINAL HYSTERECTOMY  2004   COLPOSCOPY  12/12/2021   COLPOSCOPY  01/29/2023   in GYN office procedure   NASAL FLAP ROTATION Left 01/27/2021   Procedure: NASAL FLAP ROTATION;  Surgeon: Elisabeth Craig RAMAN, MD;  Location: [facility] OR;  Service: Plastics;  Laterality: Left;   NASAL FLAP ROTATION Left 10/10/2021   Procedure: Revision left nasal forehead flap with complex closure,  possible cartilage graft to left nose;  Surgeon: Marene Sieving, MD;  Location: [facility];  Service: Plastics;  Laterality: Left;   NASAL FLAP ROTATION Left 02/27/2023   Procedure: DEBULKING OF LEFT NASAL FLAP;  Surgeon: Luciano Standing, MD;  Location: [facility] OR;  Service: ENT;  Laterality: Left;   NASAL RECONSTRUCTION Left 01/27/2021   Procedure: NASAL RECONSTRUCTION;  Surgeon: Elisabeth Craig RAMAN, MD;  Location: [facility] OR;  Service: Plastics;  Laterality: Left;   NASAL RECONSTRUCTION Bilateral 02/21/2021   Procedure: DIVISION AND INSET OF NASAL FLAPS, DEBRIDEMENT RIGHT NASOLABIAL FLAP, REVISION OF INSET RIGHT NASOLABIAL FLAP;  Surgeon: Elisabeth Craig RAMAN, MD;  Location: [facility] OR;  Service: Plastics;  Laterality: Bilateral;   NASAL RECONSTRUCTION Right 03/13/2021   Procedure: Division and Inset forehead flap, with nasal reconstruction with flaps and grafts as necessary, left rib cartilage graft.;  Surgeon: Elisabeth Craig RAMAN, MD;  Location: [facility] OR;  Service: Plastics;  Laterality: Right;  1.5 hour   RHINOPLASTY Left 01/18/2021   Procedure: PARTIAL RHINECTOMY WITH FROZEN SECTION;  Surgeon: Jesus Oliphant, MD;  Location: [facility] OR;  Service: ENT;  Laterality: Left;   SKIN FULL THICKNESS GRAFT Left 01/27/2021   Procedure: SKIN GRAFT FULL THICKNESS;  Surgeon: Elisabeth Craig RAMAN, MD;  Location: [facility] OR;  Service: Plastics;  Laterality: Left;   TONSILLECTOMY  10-11 yrs old    Family History  Problem Relation Age of Onset   Heart failure Mother        started in her 60's   Hypertension Mother  Hyperlipidemia Mother    Lung cancer Father    Hypertension Father    Hypercholesterolemia Father    Diabetes Sister        diet controlled   Dysrhythmia Sister        possible in the past   Healthy Daughter    Breast cancer Neg Hx     Social History   Socioeconomic History   Marital status: Divorced    Spouse name: Not on file   Number of children: 1   Years of education: 1   Highest education level: 12th grade   Occupational History   Not on file  Tobacco Use   Smoking status: Former    Current packs/day: 0.25    Average packs/day: 0.3 packs/day for 30.0 years (7.5 ttl pk-yrs)    Types: Cigarettes    Passive exposure: Past   Smokeless tobacco: Never   Tobacco comments:    4 cigs per day  Vaping Use   Vaping status: Never Used  Substance and Sexual Activity   Alcohol use: Yes    Alcohol/week: 1.0 - 2.0 standard drink of alcohol    Types: 1 - 2 Glasses of wine per week    Comment: occassional wine   Drug use: No   Sexual activity: Not Currently    Birth control/protection: Surgical    Comment: Partial hysterectomy, Uterous only. Pt still has her cervix and ovaries.  Other Topics Concern   Not on file  Social History Narrative   Not on file   Social Drivers of Health   Financial Resource Strain: Patient Declined (11/27/2023)   Overall Financial Resource Strain (CARDIA)    Difficulty of Paying Living Expenses: Patient declined  Food Insecurity: Patient Declined (11/27/2023)   Hunger Vital Sign    Worried About Running Out of Food in the Last Year: Patient declined    Ran Out of Food in the Last Year: Patient declined  Transportation Needs: No Transportation Needs (11/27/2023)   PRAPARE - Administrator, Civil Service (Medical): No    Lack of Transportation (Non-Medical): No  Physical Activity: Insufficiently Active (11/27/2023)   Exercise Vital Sign    Days of Exercise per Week: 5 days    Minutes of Exercise per Session: 10 min  Stress: No Stress Concern Present (11/27/2023)   [employer] of Occupational Health - Occupational Stress Questionnaire    Feeling of Stress: Not at all  Social Connections: Moderately Isolated (11/27/2023)   Social Connection and Isolation Panel    Frequency of Communication with Friends and Family: More than three times a week    Frequency of Social Gatherings with Friends and Family: Once a week    Attends Religious Services: More  than 4 times per year    Active Member of [employer] or Organizations: No    Attends Engineer, structural: Not on file    Marital Status: Divorced    Allergies  Allergen Reactions   Morphine And Codeine Nausea And Vomiting   Doxycycline  Rash    Outpatient Medications Prior to Visit  Medication Sig Dispense Refill   cetirizine (ZYRTEC) 10 MG tablet Take 10 mg by mouth daily.     cholecalciferol  (VITAMIN D3) 25 MCG (1000 UNIT) tablet Take 1 tablet (1,000 Units total) by mouth every morning. 12 tablet 0   amLODipine  (NORVASC ) 10 MG tablet Take 1 tablet (10 mg total) by mouth daily. 90 tablet 1   atorvastatin  (LIPITOR) 20 MG tablet Take 1 tablet (20  mg total) by mouth daily. 90 tablet 1   magnesium  oxide (MAG-OX) 400 MG tablet Take 1 tablet (400 mg total) by mouth daily. 30 tablet 0   metoprolol  succinate (TOPROL -XL) 100 MG 24 hr tablet Take 1 tablet (100 mg total) by mouth daily. 90 tablet 1   predniSONE  (STERAPRED UNI-PAK 21 TAB) 10 MG (21) TBPK tablet Take by mouth daily. Take 6 tabs by mouth daily  for 2 days, then 5 tabs for 2 days, then 4 tabs for 2 days, then 3 tabs for 2 days, 2 tabs for 2 days, then 1 tab by mouth daily for 2 days 42 tablet 0   triamcinolone  cream (KENALOG ) 0.1 % Apply 1 Application topically 2 (two) times daily. 30 g 0   triamcinolone  ointment (KENALOG ) 0.1 % Apply 1 Application topically daily as needed. (Patient not taking: Reported on 11/28/2023) 80 g 0   No facility-administered medications prior to visit.     ROS Review of Systems  Constitutional:  Negative for activity change and appetite change.  HENT:  Negative for sinus pressure and sore throat.   Respiratory:  Negative for chest tightness, shortness of breath and wheezing.   Cardiovascular:  Negative for chest pain and palpitations.  Gastrointestinal:  Negative for abdominal distention, abdominal pain and constipation.  Genitourinary: Negative.   Musculoskeletal: Negative.    Psychiatric/Behavioral:  Negative for behavioral problems and dysphoric mood.     Objective:  BP 128/68 (BP Location: Left Arm, Patient Position: Sitting, Cuff Size: Small)   Pulse 71   Resp 18   Wt 106 lb 9.6 oz (48.4 kg)   SpO2 99%   BMI 16.70 kg/m      11/28/2023    8:43 AM 09/24/2023    1:56 PM 06/28/2023    6:55 PM  BP/Weight  Systolic BP 128 121 145  Diastolic BP 68 67 77  Wt. (Lbs) 106.6    BMI 16.7 kg/m2        Physical Exam Constitutional:      Appearance: She is well-developed.  HENT:     Nose:     Comments: Irregular shaped nose with smaller left nostril Cardiovascular:     Rate and Rhythm: Normal rate.     Heart sounds: Normal heart sounds. No murmur heard. Pulmonary:     Effort: Pulmonary effort is normal.     Breath sounds: Normal breath sounds. No wheezing or rales.  Chest:     Chest wall: No tenderness.  Abdominal:     General: Bowel sounds are normal. There is no distension.     Palpations: Abdomen is soft. There is no mass.     Tenderness: There is no abdominal tenderness.  Musculoskeletal:        General: Normal range of motion.     Right lower leg: No edema.     Left lower leg: No edema.  Neurological:     Mental Status: She is alert and oriented to person, place, and time.  Psychiatric:        Mood and Affect: Mood normal.        Latest Ref Rng & Units 05/29/2023    9:08 AM 02/27/2023    5:55 AM 11/28/2022    9:07 AM  CMP  Glucose 70 - 99 mg/dL 99  894  896   BUN 8 - 27 mg/dL 14  12  12    Creatinine 0.57 - 1.00 mg/dL 9.34  9.24  9.35   Sodium 134 - 144 mmol/L  141  138  140   Potassium 3.5 - 5.2 mmol/L 4.4  3.9  4.3   Chloride 96 - 106 mmol/L 100  101  102   CO2 20 - 29 mmol/L 23  24  22    Calcium  8.7 - 10.3 mg/dL 9.7  9.6  9.7   Total Protein 6.0 - 8.5 g/dL 7.1   7.0   Total Bilirubin 0.0 - 1.2 mg/dL 0.5   0.3   Alkaline Phos 44 - 121 IU/L 116   107   AST 0 - 40 IU/L 30   26   ALT 0 - 32 IU/L 26   20     Lipid Panel      Component Value Date/Time   CHOL 176 05/29/2023 0908   TRIG 67 05/29/2023 0908   HDL 99 05/29/2023 0908   CHOLHDL 1.8 06/09/2020 1625   CHOLHDL 1.7 12/21/2015 1230   VLDL 14 12/21/2015 1230   LDLCALC 64 05/29/2023 0908    CBC    Component Value Date/Time   WBC 6.9 02/27/2023 0555   RBC 4.14 02/27/2023 0555   HGB 13.4 02/27/2023 0555   HGB 13.1 05/28/2022 1644   HCT 40.0 02/27/2023 0555   HCT 40.1 05/28/2022 1644   PLT 274 02/27/2023 0555   PLT 258 05/28/2022 1644   MCV 96.6 02/27/2023 0555   MCV 96 05/28/2022 1644   MCH 32.4 02/27/2023 0555   MCHC 33.5 02/27/2023 0555   RDW 13.2 02/27/2023 0555   RDW 12.8 05/28/2022 1644   LYMPHSABS 2.1 05/28/2022 1644   MONOABS 0.7 10/30/2016 1222   EOSABS 0.1 05/28/2022 1644   BASOSABS 0.1 05/28/2022 1644    Lab Results  Component Value Date   HGBA1C 5.6 11/28/2022       Assessment & Plan Nasal dryness, left nostril, post-surgery Persistent nasal dryness post-surgery. Saline spray ineffective. - Trial of Flonase , an over-the-counter corticosteroid spray.  Essential hypertension Blood pressure well-controlled with amlodipine  and metoprolol . -Counseled on blood pressure goal of less than 130/80, low-sodium, DASH diet, medication compliance, 150 minutes of moderate intensity exercise per week. Discussed medication compliance, adverse effects.   Pure hypercholesterolemia Cholesterol levels normal with atorvastatin . - Low-cholesterol diet  General Health Maintenance/need for influenza vaccine Pending colon cancer screening. Flu shot to be administered. Second shingles vaccine dose needed, advised to obtain from health department or explore financial assistance. - Complete colon cancer screening with stool test. - Administer flu shot today. - Obtain second shingles vaccine dose from health department or explore financial assistance.      Meds ordered this encounter  Medications   amLODipine  (NORVASC ) 10 MG tablet     Sig: Take 1 tablet (10 mg total) by mouth daily.    Dispense:  90 tablet    Refill:  1   atorvastatin  (LIPITOR) 20 MG tablet    Sig: Take 1 tablet (20 mg total) by mouth daily.    Dispense:  90 tablet    Refill:  1   magnesium  oxide (MAG-OX) 400 MG tablet    Sig: Take 1 tablet (400 mg total) by mouth daily.    Dispense:  90 tablet    Refill:  1   metoprolol  succinate (TOPROL -XL) 100 MG 24 hr tablet    Sig: Take 1 tablet (100 mg total) by mouth daily.    Dispense:  90 tablet    Refill:  1    Follow-up: Return in about 6 months (around 05/28/2024) for Chronic medical conditions.  [name], MD, FAAFP. [facility] [place], [place] [phone]   11/28/2023, 10:04 AM

## 2023-11-28 NOTE — Patient Instructions (Signed)
 VISIT SUMMARY:  Today, we addressed your persistent nasal dryness in the left nostril, reviewed your blood pressure and cholesterol management, and discussed general health maintenance including vaccinations and cancer screening.  YOUR PLAN:  -NASAL DRYNESS, LEFT NOSTRIL, POST-SURGERY: You have persistent dryness in your left nostril following surgery. Since the saline spray has not been effective, we will try Flonase , an over-the-counter nasal spray that can help reduce inflammation and dryness.  -ESSENTIAL HYPERTENSION: Your blood pressure is well-controlled with your current medications, amlodipine  and metoprolol . No changes are needed at this time.  -PURE HYPERCHOLESTEROLEMIA: Your cholesterol levels are normal with the use of atorvastatin . Continue taking your medication as prescribed.  -GENERAL HEALTH MAINTENANCE: You need to complete your colon cancer screening with a stool test. We administered your flu shot today. You also need to get the second dose of the shingles vaccine, which you can obtain from the health department or explore financial assistance options.  INSTRUCTIONS:  Please complete your colon cancer screening with the stool test as soon as possible. Make sure to get the second dose of the shingles vaccine from the health department or look into financial assistance if needed.

## 2023-11-29 ENCOUNTER — Other Ambulatory Visit: Payer: Self-pay

## 2023-11-29 ENCOUNTER — Ambulatory Visit: Payer: Self-pay | Admitting: Family Medicine

## 2023-11-29 DIAGNOSIS — R7309 Other abnormal glucose: Secondary | ICD-10-CM

## 2023-11-29 LAB — CMP14+EGFR
ALT: 28 IU/L (ref 0–32)
AST: 32 IU/L (ref 0–40)
Albumin: 4.8 g/dL (ref 3.9–4.9)
Alkaline Phosphatase: 101 IU/L (ref 49–135)
BUN/Creatinine Ratio: 17 (ref 12–28)
BUN: 12 mg/dL (ref 8–27)
Bilirubin Total: 0.4 mg/dL (ref 0.0–1.2)
CO2: 23 mmol/L (ref 20–29)
Calcium: 9.8 mg/dL (ref 8.7–10.3)
Chloride: 101 mmol/L (ref 96–106)
Creatinine, Ser: 0.72 mg/dL (ref 0.57–1.00)
Globulin, Total: 2.4 g/dL (ref 1.5–4.5)
Glucose: 105 mg/dL — ABNORMAL HIGH (ref 70–99)
Potassium: 4.2 mmol/L (ref 3.5–5.2)
Sodium: 138 mmol/L (ref 134–144)
Total Protein: 7.2 g/dL (ref 6.0–8.5)
eGFR: 94 mL/min/1.73 (ref >59)

## 2023-11-29 LAB — LP+NON-HDL CHOLESTEROL
Cholesterol, Total: 188 mg/dL (ref 100–199)
HDL: 108 mg/dL (ref >39)
LDL Chol Calc (NIH): 67 mg/dL (ref 0–99)
Total Non-HDL-Chol (LDL+VLDL): 80 mg/dL (ref 0–129)
Triglycerides: 74 mg/dL (ref 0–149)
VLDL Cholesterol Cal: 13 mg/dL (ref 5–40)

## 2023-11-29 LAB — CBC WITH DIFFERENTIAL/PLATELET
Basophils Absolute: 0.1 x10E3/uL (ref 0.0–0.2)
Basos: 1 %
EOS (ABSOLUTE): 0.1 x10E3/uL (ref 0.0–0.4)
Eos: 1 %
Hematocrit: 44.2 % (ref 34.0–46.6)
Hemoglobin: 14.5 g/dL (ref 11.1–15.9)
Immature Grans (Abs): 0 x10E3/uL (ref 0.0–0.1)
Immature Granulocytes: 0 %
Lymphocytes Absolute: 1.4 x10E3/uL (ref 0.7–3.1)
Lymphs: 18 %
MCH: 32.2 pg (ref 26.6–33.0)
MCHC: 32.8 g/dL (ref 31.5–35.7)
MCV: 98 fL — ABNORMAL HIGH (ref 79–97)
Monocytes Absolute: 0.8 x10E3/uL (ref 0.1–0.9)
Monocytes: 10 %
Neutrophils Absolute: 5.6 x10E3/uL (ref 1.4–7.0)
Neutrophils: 70 %
Platelets: 297 x10E3/uL (ref 150–450)
RBC: 4.51 x10E6/uL (ref 3.77–5.28)
RDW: 12.8 % (ref 11.7–15.4)
WBC: 8 x10E3/uL (ref 3.4–10.8)

## 2023-12-02 LAB — HEMOGLOBIN A1C
Est. average glucose Bld gHb Est-mCnc: 108 mg/dL
Hgb A1c MFr Bld: 5.4 % (ref 4.8–5.6)

## 2023-12-02 LAB — SPECIMEN STATUS REPORT

## 2023-12-17 ENCOUNTER — Other Ambulatory Visit (HOSPITAL_COMMUNITY): Payer: Self-pay

## 2023-12-17 ENCOUNTER — Other Ambulatory Visit: Payer: Self-pay

## 2024-01-31 ENCOUNTER — Encounter: Payer: Self-pay | Admitting: Nurse Practitioner

## 2024-01-31 ENCOUNTER — Ambulatory Visit: Payer: Self-pay

## 2024-01-31 ENCOUNTER — Ambulatory Visit: Payer: Self-pay | Admitting: Nurse Practitioner

## 2024-01-31 VITALS — BP 133/60 | HR 76 | Temp 97.2°F | Wt 104.2 lb

## 2024-01-31 DIAGNOSIS — M79605 Pain in left leg: Secondary | ICD-10-CM

## 2024-01-31 DIAGNOSIS — M79604 Pain in right leg: Secondary | ICD-10-CM

## 2024-01-31 MED ORDER — PREDNISONE 20 MG PO TABS: 20.0000 mg | ORAL_TABLET | Freq: Every day | ORAL | 0 refills | Status: AC

## 2024-01-31 NOTE — Progress Notes (Signed)
 "  Subjective   Patient ID: Melanie Harris, female    DOB: 1960-03-03, 63 y.o.   MRN: [id number]  Chief Complaint  Patient presents with   Numbness    Both legs x 1 week.     Referring provider: Delbert Clam, MD  Melanie Harris is a 63 y.o. female with Past Medical History: 07/25/2011: Abscess of right thigh No date: Abscess of thigh     Comment:  right 12/21/2015: Anxiety state No date: Cancer [facility])     Comment:  squamous cell on nose 08/12/2013: Cough No date: Hypertension 07/03/2016: Low grade squamous intraepithelial lesion (LGSIL) on  cervical Pap smear     Comment:  [ ]  rpt cytology and HPV testing 06/2017  2002: TAH for               benign indications. Pathology negative 06/2016: LSIL/no               hpv testing done No date: Palpitation     Comment:  hx PVCs 12/22/2015: Pure hypercholesterolemia 08/21/2011: Rash 08/12/2013: Smoking 02/21/2017: Xerosis of skin   HPI  Patient presents today for an acute visit.  She states that she has been having numbness in bilateral legs from knees down since but this past Sunday.  Patient is concerned that this is due to her cholesterol medication.  She has held the medication for the past 2 days and states that the symptoms have improved.  We discussed that we will trial prednisone  and order labs today.  We will place a referral for patient to consult with pharmacy due to possible side effects of cholesterol medications. Denies f/c/s, n/v/d, hemoptysis, PND, leg swelling Denies chest pain or edema      Allergies[1]  Immunization History  Administered Date(s) Administered   Influenza Whole 01/07/2018, 11/17/2018   Influenza, Seasonal, Injecte, Preservative Fre 11/28/2022, 11/28/2023   Influenza,inj,Quad PF,6+ Mos 04/22/2013, 04/19/2014, 11/21/2015, 10/29/2016, 12/29/2019, 12/12/2020   Moderna Sars-Covid-2 Vaccination 05/15/2019, 06/12/2019   PNEUMOCOCCAL CONJUGATE-20 12/12/2020   Pneumococcal  Polysaccharide-23 04/22/2013   Zoster Recombinant(Shingrix ) 05/29/2023    Tobacco History: Tobacco Use History[2] Counseling given: Not Answered Tobacco comments: 4 cigs per day   Outpatient Encounter Medications as of 01/31/2024  Medication Sig   amLODipine  (NORVASC ) 10 MG tablet Take 1 tablet (10 mg total) by mouth daily.   cetirizine (ZYRTEC) 10 MG tablet Take 10 mg by mouth daily.   cholecalciferol  (VITAMIN D3) 25 MCG (1000 UNIT) tablet Take 1 tablet (1,000 Units total) by mouth every morning.   magnesium  oxide (MAG-OX) 400 MG tablet Take 1 tablet (400 mg total) by mouth daily.   metoprolol  succinate (TOPROL -XL) 100 MG 24 hr tablet Take 1 tablet (100 mg total) by mouth daily.   predniSONE  (DELTASONE ) 20 MG tablet Take 1 tablet (20 mg total) by mouth daily with breakfast.   atorvastatin  (LIPITOR) 20 MG tablet Take 1 tablet (20 mg total) by mouth daily. (Patient not taking: Reported on 01/31/2024)   [DISCONTINUED] mupirocin  nasal ointment (BACTROBAN ) 2 % Place 1 application into the nose 2 (two) times daily. Use one-half of tube in each nostril twice daily for five (5) days. After application, press sides of nose together and gently massage.   No facility-administered encounter medications on file as of 01/31/2024.    Review of Systems  Review of Systems  Constitutional: Negative.   HENT: Negative.    Cardiovascular: Negative.   Gastrointestinal: Negative.   Musculoskeletal:  Numbness and tingling to lower extremities.   Allergic/Immunologic: Negative.   Neurological: Negative.   Psychiatric/Behavioral: Negative.       Objective:   BP 133/60 (BP Location: Right Arm, Patient Position: Sitting, Cuff Size: Normal)   Pulse 76   Temp (!) 97.2 F (36.2 C) (Temporal)   Wt 104 lb 3.2 oz (47.3 kg)   SpO2 98%   BMI 16.32 kg/m   Wt Readings from Last 5 Encounters:  01/31/24 104 lb 3.2 oz (47.3 kg)  11/28/23 106 lb 9.6 oz (48.4 kg)  05/29/23 102 lb 12.8 oz (46.6 kg)   02/27/23 110 lb (49.9 kg)  02/14/23 103 lb (46.7 kg)     Physical Exam Vitals and nursing note reviewed.  Constitutional:      General: She is not in acute distress.    Appearance: She is well-developed.  Cardiovascular:     Rate and Rhythm: Normal rate and regular rhythm.  Pulmonary:     Effort: Pulmonary effort is normal.     Breath sounds: Normal breath sounds.  Neurological:     Mental Status: She is alert and oriented to person, place, and time.       Assessment & Plan:   Bilateral leg pain -     AMB Referral [facility] Care Management -     predniSONE ; Take 1 tablet (20 mg total) by mouth daily with breakfast.  Dispense: 5 tablet; Refill: 0 -     CBC -     Comprehensive metabolic panel with GFR     Return if symptoms worsen or fail to improve.   Bascom GORMAN Borer, NP 02/05/2024     [1]  Allergies Allergen Reactions   Morphine And Codeine Nausea And Vomiting   Doxycycline  Rash  [2]  Social History Tobacco Use  Smoking Status Former   Current packs/day: 0.25   Average packs/day: 0.3 packs/day for 30.0 years (7.5 ttl pk-yrs)   Types: Cigarettes   Passive exposure: Past  Smokeless Tobacco Never  Tobacco Comments   4 cigs per day   "

## 2024-01-31 NOTE — Telephone Encounter (Signed)
 FYI Only or Action Required?: FYI only for provider: appointment scheduled on 01/31/24.  Patient was last seen in primary care on 11/28/2023 by Newlin, Enobong, MD.  Called Nurse Triage reporting Medication Reaction and Numbness (/).  Symptoms began several days ago.  Interventions attempted: Other: Stopped taking lipitor.  Symptoms are: completely resolved.  Triage Disposition: See PCP When Office is Open (Within 3 Days)  Patient/caregiver understands and will follow disposition?: Yes   Onset of moderate heaviness and numbness in both legs with tingling in feet on 12/21. No changes to speech or vision. Denies weakness, confusion or difficulty walking. No changes to bowel or bladder control. 1x episode mild dizziness today x30 minutes. Stopped taking atorvastatin  (has been taking for years, no dose changes) yesterday with complete resolution of numbness and heaviness on both legs today. Scheduled appt with provider at different office in pt region today d/t no availability at home office with any provider within timeframe. Advised UC or ED for worsening symptoms.    Copied from CRM #[id number]. Topic: Clinical - Medication Question >> Jan 31, 2024  9:59 AM Melanie Harris wrote: Reason for CRM: Patient is calling to report that she has stopped taking the Rx #: [id number]  amLODipine  (NORVASC ) 10 MG tablet [[id number]]  For 2 days. Reporting L & R leg numbness & heaviness. Patient has not had these symptoms since she has stopped taking the medication. Please advise Reason for Disposition  [1] Numbness or tingling on both sides of body AND [2] is a new symptom present > 24 hours  Answer Assessment - Initial Assessment Questions 1. SYMPTOM: What is the main symptom you are concerned about? (e.g., weakness, numbness)     Onset of mild heaviness and numbness in both legs on 12/21  2. ONSET: When did this start? (e.g., minutes, hours, days; while sleeping)     12/21  3. LAST NORMAL: When  was the last time you (the patient) were normal (no symptoms)?     12/20  4. PATTERN Does this come and go, or has it been constant since it started?  Is it present now?     Constant since 12/21  5. CARDIAC SYMPTOMS: Have you had any of the following symptoms: chest pain, difficulty breathing, palpitations?     Denies  6. NEUROLOGIC SYMPTOMS: Have you had any of the following symptoms: headache, dizziness, vision loss, double vision, changes in speech, unsteady on your feet?     1x episode mild dizziness today x30 minutes.  7. OTHER SYMPTOMS: Do you have any other symptoms?     Denies  Protocols used: Neurologic Deficit-A-AH

## 2024-01-31 NOTE — Telephone Encounter (Signed)
Disposition noted.

## 2024-02-01 LAB — CBC
Hematocrit: 43.9 % (ref 34.0–46.6)
Hemoglobin: 14.2 g/dL (ref 11.1–15.9)
MCH: 32 pg (ref 26.6–33.0)
MCHC: 32.3 g/dL (ref 31.5–35.7)
MCV: 99 fL — ABNORMAL HIGH (ref 79–97)
Platelets: 300 x10E3/uL (ref 150–450)
RBC: 4.44 x10E6/uL (ref 3.77–5.28)
RDW: 13.2 % (ref 11.7–15.4)
WBC: 8.3 x10E3/uL (ref 3.4–10.8)

## 2024-02-01 LAB — COMPREHENSIVE METABOLIC PANEL WITH GFR
ALT: 31 IU/L (ref 0–32)
AST: 28 IU/L (ref 0–40)
Albumin: 4.9 g/dL (ref 3.9–4.9)
Alkaline Phosphatase: 99 IU/L (ref 49–135)
BUN/Creatinine Ratio: 22 (ref 12–28)
BUN: 15 mg/dL (ref 8–27)
Bilirubin Total: 0.4 mg/dL (ref 0.0–1.2)
CO2: 23 mmol/L (ref 20–29)
Calcium: 10.1 mg/dL (ref 8.7–10.3)
Chloride: 101 mmol/L (ref 96–106)
Creatinine, Ser: 0.68 mg/dL (ref 0.57–1.00)
Globulin, Total: 2.4 g/dL (ref 1.5–4.5)
Glucose: 100 mg/dL — ABNORMAL HIGH (ref 70–99)
Potassium: 4.5 mmol/L (ref 3.5–5.2)
Sodium: 140 mmol/L (ref 134–144)
Total Protein: 7.3 g/dL (ref 6.0–8.5)
eGFR: 98 mL/min/1.73

## 2024-02-05 ENCOUNTER — Ambulatory Visit: Payer: Self-pay | Admitting: Nurse Practitioner

## 2024-02-05 ENCOUNTER — Encounter: Payer: Self-pay | Admitting: Nurse Practitioner

## 2024-02-11 ENCOUNTER — Telehealth: Payer: Self-pay

## 2024-02-11 NOTE — Telephone Encounter (Signed)
 Patient was called and she states that she saw Bascom Borer on 01/31/24 due to her having cramps in her legs she was informed that it may be due to her Lipitor medication. Patient states that she was informed by Tonya that a pharmacist would be in touch with her regarding her medication, she has not heard anything and she has stopped taking the Lipitor medication.

## 2024-02-11 NOTE — Telephone Encounter (Signed)
 Copied from CRM [id number]. Topic: Clinical - Medical Advice >> Feb 11, 2024 10:17 AM Kevelyn M wrote: Reason for CRM: Patient calling about a possible replacement for atorvastatin  (LIPITOR) 20 MG tablet. Bascom said that she would have a pharmacist reach out to the patient but they have not. Please advise.  Call back #[phone]

## 2024-02-12 NOTE — Telephone Encounter (Signed)
 Please schedule a virtual visit with me so we can discuss this in detail as it is not a one sentence easy solution.

## 2024-02-13 ENCOUNTER — Emergency Department (HOSPITAL_BASED_OUTPATIENT_CLINIC_OR_DEPARTMENT_OTHER): Admitting: Radiology

## 2024-02-13 ENCOUNTER — Emergency Department (HOSPITAL_BASED_OUTPATIENT_CLINIC_OR_DEPARTMENT_OTHER)
Admission: EM | Admit: 2024-02-13 | Discharge: 2024-02-13 | Disposition: A | Discharge status: Home / Self Care | Attending: Emergency Medicine | Admitting: Emergency Medicine

## 2024-02-13 ENCOUNTER — Ambulatory Visit: Payer: Self-pay

## 2024-02-13 ENCOUNTER — Other Ambulatory Visit: Payer: Self-pay

## 2024-02-13 DIAGNOSIS — F17200 Nicotine dependence, unspecified, uncomplicated: Secondary | ICD-10-CM | POA: Insufficient documentation

## 2024-02-13 DIAGNOSIS — R202 Paresthesia of skin: Secondary | ICD-10-CM | POA: Insufficient documentation

## 2024-02-13 DIAGNOSIS — I10 Essential (primary) hypertension: Secondary | ICD-10-CM | POA: Insufficient documentation

## 2024-02-13 DIAGNOSIS — R072 Precordial pain: Secondary | ICD-10-CM | POA: Insufficient documentation

## 2024-02-13 DIAGNOSIS — R911 Solitary pulmonary nodule: Secondary | ICD-10-CM | POA: Insufficient documentation

## 2024-02-13 DIAGNOSIS — Z79899 Other long term (current) drug therapy: Secondary | ICD-10-CM | POA: Insufficient documentation

## 2024-02-13 LAB — CBC
HCT: 41.7 % (ref 36.0–46.0)
Hemoglobin: 14.4 g/dL (ref 12.0–15.0)
MCH: 31.9 pg (ref 26.0–34.0)
MCHC: 34.5 g/dL (ref 30.0–36.0)
MCV: 92.3 fL (ref 80.0–100.0)
Platelets: 283 K/uL (ref 150–400)
RBC: 4.52 MIL/uL (ref 3.87–5.11)
RDW: 13.3 % (ref 11.5–15.5)
WBC: 9 K/uL (ref 4.0–10.5)
nRBC: 0 % (ref 0.0–0.2)

## 2024-02-13 LAB — BASIC METABOLIC PANEL WITH GFR
Anion gap: 17 — ABNORMAL HIGH (ref 5–15)
BUN: 13 mg/dL (ref 8–23)
CO2: 21 mmol/L — ABNORMAL LOW (ref 22–32)
Calcium: 10.3 mg/dL (ref 8.9–10.3)
Chloride: 97 mmol/L — ABNORMAL LOW (ref 98–111)
Creatinine, Ser: 0.66 mg/dL (ref 0.44–1.00)
GFR, Estimated: >60 mL/min
Glucose, Bld: 137 mg/dL — ABNORMAL HIGH (ref 70–99)
Potassium: 3.9 mmol/L (ref 3.5–5.1)
Sodium: 135 mmol/L (ref 135–145)

## 2024-02-13 LAB — TROPONIN T, HIGH SENSITIVITY
Troponin T High Sensitivity: <15 ng/L (ref 0–19)
Troponin T High Sensitivity: <15 ng/L (ref 0–19)

## 2024-02-13 NOTE — Telephone Encounter (Signed)
Noted patient currently in ED

## 2024-02-13 NOTE — Telephone Encounter (Signed)
 Call placed to patient and VM was left informing patient to schedule video visit.  Patient has been sent MyChart message also.

## 2024-02-13 NOTE — ED Triage Notes (Signed)
 Reports left sided chest pressure that rads into left arm starting this morning around 0900. States left arm tingling.

## 2024-02-13 NOTE — Telephone Encounter (Signed)
 FYI Only or Action Required?: FYI only for provider: ED advised.  Patient was last seen in primary care on 01/31/2024 by Oley Bascom RAMAN, NP.  Called Nurse Triage reporting Numbness.  Symptoms began today.  Interventions attempted: Nothing.  Symptoms are: completely resolved.  Triage Disposition: Go to ED Now (Notify PCP)  Patient/caregiver understands and will follow disposition?: Yes, will follow disposition  Copied from CRM #[id number]. Topic: Clinical - Red Word Triage >> Feb 13, 2024 10:39 AM Lonell PEDLAR wrote: Red Word that prompted transfer to Nurse Triage: Patent had episode where she experienced left arm numbness and heaviness underneath chin/around throat. Symptoms have since subsided. Left arm still numb, but has improved. Reason for Disposition  [1] Numbness (i.e., loss of sensation) of the face, arm / hand, or leg / foot on one side of the body AND [2] sudden onset AND [3] brief (now gone)  Answer Assessment - Initial Assessment Questions Pt states that she had numbness this morning in her L arm, states that she also had heaviness to her chin and throat area. Pt was advised to go to the ED, agreeable.  Protocols used: Neurologic Deficit-A-AH

## 2024-02-13 NOTE — Discharge Instructions (Signed)
 Please read and follow all provided instructions.  Your diagnoses today include:  1. Precordial pain   2. Arm paresthesia, left   3. Pulmonary nodule     Tests performed today include: An EKG of your heart: no concerning findings A chest x-ray: Shows a pulmonary nodule which will need rechecked by primary care, you may need a CT scan to further evaluate Cardiac enzymes - a blood test for heart muscle damage, were both normal Blood counts and electrolytes showing no concerning findings Vital signs. See below for your results today.   Medications prescribed:  None  Take any prescribed medications only as directed.  Follow-up instructions: Please follow-up with your primary care provider as soon as you can for further evaluation of your symptoms.   Return instructions:  SEEK IMMEDIATE MEDICAL ATTENTION IF: You have severe chest pain, especially if the pain is crushing or pressure-like and spreads to the arms, back, neck, or jaw, or if you have sweating, nausea or vomiting, or trouble with breathing. THIS IS AN EMERGENCY. Do not wait to see if the pain will go away. Get medical help at once. Call 911. DO NOT drive yourself to the hospital.  Your chest pain gets worse and does not go away after a few minutes of rest.  You have an attack of chest pain lasting longer than what you usually experience.  You have significant dizziness, if you pass out, or have trouble walking.  You have chest pain not typical of your usual pain for which you originally saw your caregiver.  Return if you have weakness in your arms or legs, slurred speech, trouble walking or talking, confusion, or trouble with your balance.  You have any other emergent concerns regarding your health.  Additional Information: Chest pain comes from many different causes. Your caregiver has diagnosed you as having chest pain that is not specific for one problem, but does not require admission.  You are at low risk for an acute  heart condition or other serious illness.   Your vital signs today were: BP (!) 124/56   Pulse 66   Temp 98.1 F (36.7 C)   Resp 13   SpO2 99%  If your blood pressure (BP) was elevated above 135/85 this visit, please have this repeated by your doctor within one month. --------------

## 2024-02-13 NOTE — ED Notes (Signed)
 Patient transported to X-ray

## 2024-02-13 NOTE — ED Provider Notes (Signed)
 " [facility] AT [facility] Provider Note   CSN: [id number] Arrival date & time: 02/13/24  1103     Patient presents with: Chest Pain   Melanie Harris is a 64 y.o. female.   Patient with history of hypertension, high cholesterol, tobacco use, family history of heart disease (mother starting in 46's) --presents to the emergency department today for evaluation of left arm paresthesias and chest heaviness.  Patient states that she woke this morning around 7 AM in normal state of health.  She was driving around 1:54 AM today when she developed paresthesias in her left arm.  She denies weakness and felt that she could move and that her strength was normal.  The symptoms lasted until about 9:15 AM.  Patient had some residual chest heaviness in the mid and upper chest with a tight sensation.  No associated diaphoresis or vomiting.  No shortness of breath.  This resolved after a few more minutes, and and she was at baseline around 9:30 AM.  Patient reports having numb sensation below her knees and her legs intermittently recently.  Thought due to atorvastatin , she has discontinued this for about 2 weeks.  She reports having some similar symptoms in the left upper extremity at times in the past.  Patient denies signs of stroke including: facial droop, slurred speech, aphasia, weakness/numbness in extremities, imbalance/trouble walking.  Patient denies risk factors for pulmonary embolism including: unilateral leg swelling, history of DVT/PE/other blood clots, use of exogenous hormones, recent immobilizations, recent surgery, recent travel (>4hr segment), malignancy, hemoptysis.          Prior to Admission medications  Medication Sig Start Date End Date Taking? Authorizing Provider  amLODipine  (NORVASC ) 10 MG tablet Take 1 tablet (10 mg total) by mouth daily. 11/28/23   Newlin, Enobong, MD  atorvastatin  (LIPITOR) 20 MG tablet Take 1 tablet (20 mg total) by mouth  daily. Patient not taking: Reported on 01/31/2024 11/28/23   Newlin, Enobong, MD  cetirizine (ZYRTEC) 10 MG tablet Take 10 mg by mouth daily.    [provider]  cholecalciferol  (VITAMIN D3) 25 MCG (1000 UNIT) tablet Take 1 tablet (1,000 Units total) by mouth every morning. 12/28/22   Newlin, Enobong, MD  magnesium  oxide (MAG-OX) 400 MG tablet Take 1 tablet (400 mg total) by mouth daily. 11/28/23   Newlin, Enobong, MD  metoprolol  succinate (TOPROL -XL) 100 MG 24 hr tablet Take 1 tablet (100 mg total) by mouth daily. 11/28/23   Newlin, Enobong, MD  predniSONE  (DELTASONE ) 20 MG tablet Take 1 tablet (20 mg total) by mouth daily with breakfast. 01/31/24   Nichols, Tonya S, NP  mupirocin  nasal ointment (BACTROBAN ) 2 % Place 1 application into the nose 2 (two) times daily. Use one-half of tube in each nostril twice daily for five (5) days. After application, press sides of nose together and gently massage. 06/09/20 06/10/20  McClung, Angela M, PA-C    Allergies: Morphine and codeine and Doxycycline     Review of Systems  Updated Vital Signs BP (!) 165/68 (BP Location: Right Arm)   Pulse 85   Temp 98.1 F (36.7 C)   Resp 18   SpO2 96%   Physical Exam Vitals and nursing note reviewed.  Constitutional:      Appearance: She is well-developed. She is not diaphoretic.  HENT:     Head: Normocephalic and atraumatic.     Right Ear: Tympanic membrane, ear canal and external ear normal.     Left Ear: Tympanic  membrane, ear canal and external ear normal.     Nose: Nose normal.     Mouth/Throat:     Mouth: Mucous membranes are not dry.     Pharynx: Uvula midline.  Eyes:     General: Lids are normal.     Extraocular Movements:     Right eye: No nystagmus.     Left eye: No nystagmus.     Conjunctiva/sclera: Conjunctivae normal.     Pupils: Pupils are equal, round, and reactive to light.  Neck:     Vascular: Normal carotid pulses. No JVD.     Trachea: Trachea normal. No tracheal deviation.   Cardiovascular:     Rate and Rhythm: Normal rate and regular rhythm.     Pulses: No decreased pulses.          Radial pulses are 2+ on the right side and 2+ on the left side.     Heart sounds: Normal heart sounds, S1 normal and S2 normal. No murmur heard. Pulmonary:     Effort: Pulmonary effort is normal. No respiratory distress.     Breath sounds: Normal breath sounds. No wheezing.  Chest:     Chest wall: No tenderness.  Abdominal:     General: Bowel sounds are normal.     Palpations: Abdomen is soft.     Tenderness: There is no abdominal tenderness. There is no guarding or rebound.  Musculoskeletal:        General: Normal range of motion.     Cervical back: Normal range of motion and neck supple. No tenderness or bony tenderness. No muscular tenderness.  Skin:    General: Skin is warm and dry.     Coloration: Skin is not pale.  Neurological:     Mental Status: She is alert and oriented to person, place, and time.     GCS: GCS eye subscore is 4. GCS verbal subscore is 5. GCS motor subscore is 6.     Cranial Nerves: No cranial nerve deficit.     Sensory: No sensory deficit.     Motor: No weakness.     Coordination: Coordination normal.     Gait: Gait normal.     Comments: Upper extremity myotomes tested bilaterally:  C5 Shoulder abduction 5/5 C6 Elbow flexion/wrist extension 5/5 C7 Elbow extension 5/5 C8 Finger flexion 5/5 T1 Finger abduction 5/5  Lower extremity myotomes tested bilaterally: L2 Hip flexion 5/5 L3 Knee extension 5/5 L4 Ankle dorsiflexion 5/5 S1 Ankle plantar flexion 5/5      (all labs ordered are listed, but only abnormal results are displayed) Labs Reviewed  BASIC METABOLIC PANEL WITH GFR - Abnormal; Notable for the following components:      Result Value   Chloride 97 (*)    CO2 21 (*)    Glucose, Bld 137 (*)    Anion gap 17 (*)    All other components within normal limits  CBC  TROPONIN T, HIGH SENSITIVITY    EKG: EKG  Interpretation Date/Time:  Thursday February 13 2024 11:10:14 EST Ventricular Rate:  79 PR Interval:  144 QRS Duration:  94 QT Interval:  391 QTC Calculation: 449 R Axis:   83  Text Interpretation: Sinus rhythm Borderline right axis deviation RSR' in V1 or V2, probably normal variant Confirmed by Cottie Cough [phone]) on 02/13/2024 11:14:51 AM  Radiology: [facility] Chest 2 View Result Date: 02/13/2024 CLINICAL DATA:  Chest pain EXAM: CHEST - 2 VIEW COMPARISON:  April 29, 2017 FINDINGS: The heart  size and mediastinal contours are within normal limits. Right lung is clear. Small nodular density is seen in left midlung which is new since prior exam. CT scan is recommended to evaluate for possible pulmonary nodule. No consolidative process is noted. The visualized skeletal structures are unremarkable. IMPRESSION: Small nodular density seen in left midlung. CT scan is recommended to evaluate for possible pulmonary nodule. Electronically Signed   By: Lynwood Landy Raddle M.D.   On: 02/13/2024 11:41     Procedures   Medications Ordered in the ED - No data to display  ED Course  Patient seen and examined. History obtained directly from patient.   Labs/EKG: Ordered CBC, BMP, troponin, EKG.  Imaging: Ordered chest x-ray.  Medications/Fluids: None ordered  Most recent vital signs reviewed and are as follows: BP (!) 165/68 (BP Location: Right Arm)   Pulse 85   Temp 98.1 F (36.7 C)   Resp 18   SpO2 96%   Initial impression: Patient with left arm paresthesias/discomfort and upper chest discomfort, resolved.  Nonexertional.  She does have some risk factors.  No previous cardiac evaluation.  Neurologically everything seems to be normal.  Low concern for TIA given paresthesias only without weakness or other symptoms, but patient counseled on stroke signs symptoms and need to monitor for these.  12:55 PM Reassessment performed. Patient appears stable.  She reports no recurrence of symptoms.  Labs  personally reviewed and interpreted including: CBC was normal; BMP with slightly low chloride, bicarb, anion gap elevated at 17, glucose 137; troponin less than 15.  Imaging personally visualized and interpreted including: Chest x-ray, agree left midlung nodule, no other concerning findings.  Reviewed pertinent lab work and imaging with patient at bedside. Questions answered.  We did discuss pulmonary nodule noted on chest x-ray and need for outpatient follow-up for this.  We did discuss that given her risk factors, this should be closely monitored to determine if there is concern for malignancy.  Encouraged outpatient follow-up in a couple of weeks for reassessment.  Most current vital signs reviewed and are as follows: BP (!) 165/68 (BP Location: Right Arm)   Pulse 85   Temp 98.1 F (36.7 C)   Resp 18   SpO2 96%   Plan: Awaiting second troponin.  2:37 PM Reassessment performed. Patient appears stable, comfortable.  Labs personally reviewed and interpreted including: Second troponin also less than 15.  Repeat EKG is stable.   EKG Interpretation Date/Time:  Thursday February 13 2024 13:48:54 EST Ventricular Rate:  69 PR Interval:  157 QRS Duration:  98 QT Interval:  415 QTC Calculation: 445 R Axis:   84  Text Interpretation: Sinus rhythm Borderline right axis deviation RSR' in V1 or V2, probably normal variant Confirmed by Cottie Cough [phone]) on 02/13/2024 1:50:34 PM        Reviewed pertinent lab work and imaging with patient at bedside. Questions answered.   Most current vital signs reviewed and are as follows: BP 132/61   Pulse 70   Temp 98.1 F (36.7 C)   Resp (!) 21   SpO2 100%   Plan: Discharge to home.   Prescriptions written for: None  Other home care instructions discussed: In regards to chest symptoms,  I encouraged patient to return to ED with severe chest pain, especially if the pain is crushing or pressure-like and spreads to the arms, back, neck, or jaw,  or if they have associated sweating, vomiting, or shortness of breath with the pain, or significant pain  with activity. We discussed that the evaluation here today indicates a low-risk of serious cause of chest pain, including heart trouble or a blood clot, but no evaluation is perfect and chest pain can evolve with time. The patient verbalized understanding and agreed.  We discussed follow-up plan of PCP follow-up versus referral to cardiology.  Patient is interested in referral to cardiology.  I do not think that this is unreasonable given her risk factor profile.  In regards to left upper extremity paresthesias, Patient counseled to return if they have weakness in their arms or legs, slurred speech, trouble walking or talking, confusion, trouble with their balance, or if they have any other concerns. Patient verbalizes understanding and agrees with plan.  She will follow-up with PCP as outpatient.  ED return instructions discussed: As above  Follow-up instructions discussed: Patient encouraged to follow-up with their PCP in 7 days.                                  Medical Decision Making Amount and/or Complexity of Data Reviewed Labs: ordered. Radiology: ordered.   For this patient's complaint of chest pain, the following emergent conditions were considered on the differential diagnosis: acute coronary syndrome, pulmonary embolism, pneumothorax, myocarditis, pericardial tamponade, aortic dissection, thoracic aortic aneurysm complication, esophageal perforation.   Other causes were also considered including: gastroesophageal reflux disease, musculoskeletal pain including costochondritis, pneumonia/pleurisy, herpes zoster, pericarditis.  In regards to possibility of ACS, patient has atypical features of pain, non-ischemic and unchanged EKG and negative troponin(s). Heart score was calculated to be 3.   In regards to possibility of PE, symptoms are atypical for PE and risk profile is low,  making PE low likelihood.  No clinical signs of DVT.  No tachycardia or hypoxia.  In regards to the left arm paresthesia, considered stroke and TIA, but felt less likely as patient did not have true numbness or associated weakness or other neurologic deficits.  Possibly peripheral etiology.  Also this was associated with shoulder and upper chest symptoms, more concerning for cardiac etiology.  Her symptoms have resolved prior to ED evaluation.  We discussed strict return precautions and outpatient follow-up.  She is willing to return with recurrent symptoms or worsening symptoms.  Discussed with patient, will defer imaging at this time given low likelihood of CVA/TIA.  The patient's vital signs, pertinent lab work and imaging were reviewed and interpreted as discussed in the ED course. Hospitalization was considered for further testing, treatments, or serial exams/observation. However as patient is well-appearing, has a stable exam, and reassuring studies today, I do not feel that they warrant admission at this time. This plan was discussed with the patient who verbalizes agreement and comfort with this plan and seems reliable and able to return to the Emergency Department with worsening or changing symptoms.        Final diagnoses:  Precordial pain  Arm paresthesia, left  Pulmonary nodule    ED Discharge Orders          Ordered    Ambulatory referral to Cardiology       Comments: If you have not heard from the Cardiology office within the next 72 hours please call [phone].   02/13/24 1437               Desiderio Chew, PA-C 02/13/24 1442    Cottie Donnice PARAS, MD 02/13/24 1459  "

## 2024-02-13 NOTE — ED Notes (Addendum)
 ED Provider at bedside.

## 2024-02-17 ENCOUNTER — Telehealth: Payer: Self-pay | Admitting: Family Medicine

## 2024-02-17 NOTE — Telephone Encounter (Signed)
 Pt confirmed appt

## 2024-02-18 ENCOUNTER — Encounter: Payer: Self-pay | Admitting: Family Medicine

## 2024-02-18 ENCOUNTER — Ambulatory Visit: Payer: Self-pay | Admitting: Family Medicine

## 2024-02-18 DIAGNOSIS — T466X5A Adverse effect of antihyperlipidemic and antiarteriosclerotic drugs, initial encounter: Secondary | ICD-10-CM

## 2024-02-18 DIAGNOSIS — E7800 Pure hypercholesterolemia, unspecified: Secondary | ICD-10-CM

## 2024-02-18 DIAGNOSIS — G720 Drug-induced myopathy: Secondary | ICD-10-CM

## 2024-02-18 NOTE — Progress Notes (Signed)
 "  Virtual Visit via Video Note  I connected with Melanie Harris, on 02/18/2024 at 8:27 AM by video enabled telemedicine device and verified that I am speaking with the correct person using two identifiers.   Consent: I discussed the limitations, risks, security and privacy concerns of performing an evaluation and management service by telemedicine and the availability of in person appointments. I also discussed with the patient that there may be a patient responsible charge related to this service. The patient expressed understanding and agreed to proceed.   Location of Patient: Home  Location of Provider: Clinic   Persons participating in Telemedicine visit: Leena Tiede Dr. Delbert    Discussed the use of AI scribe software for clinical note transcription with the patient, who gave verbal consent to proceed.  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  She had leg muscle cramps while taking Lipitor 20 mg daily, which resolved after she stopped the medication 3 weeks ago. Her legs feel much better now. Two weeks after stopping Lipitor she developed tingling in her hands and numbness in her left arm along with chest pain, prompting an emergency room visit last Thursday. By the time of discharge her chest pain and arm numbness had resolved. A pulmonary nodule was incidentally noted on chest x-ray in the ER.  She has an upcoming appointment for hospital follow-up with a nurse practitioner.  She denies weakness. She currently notes leg cramps have resolved but still has intermittent tingling in her hands.      Past Medical History:  Diagnosis Date   Abscess of right thigh 07/25/2011   Abscess  of thigh    right   Anxiety state 12/21/2015   Cancer (HCC)    squamous cell on nose   Cough 08/12/2013   Hypertension    Low grade squamous intraepithelial lesion (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 Pathology negative 06/2016: LSIL/no hpv testing done   Palpitation    hx PVCs   Pure hypercholesterolemia 12/22/2015   Rash 08/21/2011   Smoking 08/12/2013   Xerosis of skin 02/21/2017   Allergies[1]  Medications Ordered Prior to Encounter[2]  ROS: See HPI  Observations/Objective: Awake, alert, oriented x3 Not in acute distress Normal mood      Latest Ref Rng & Units 02/13/2024   11:18 AM 01/31/2024    3:48 PM 11/28/2023    9:26 AM  CMP  Glucose 70 - 99 mg/dL 862  899  894   BUN 8 - 23 mg/dL 13  15  12    Creatinine 0.44 - 1.00 mg/dL 9.33  9.31  9.27   Sodium 135 - 145 mmol/L 135  140  138   Potassium 3.5 - 5.1 mmol/L 3.9  4.5  4.2   Chloride 98 - 111 mmol/L 97  101  101   CO2 22 - 32 mmol/L 21  23  23    Calcium  8.9 - 10.3 mg/dL 89.6  89.8  9.8   Total Protein 6.0 - 8.5 g/dL  7.3  7.2   Total Bilirubin 0.0 - 1.2 mg/dL  0.4  0.4   Alkaline Phos 49 - 135 IU/L  99  101  AST 0 - 40 IU/L  28  32   ALT 0 - 32 IU/L  31  28     Lipid Panel     Component Value Date/Time   CHOL 188 11/28/2023 0926   TRIG 74 11/28/2023 0926   HDL 108 11/28/2023 0926   CHOLHDL 1.8 06/09/2020 1625   CHOLHDL 1.7 12/21/2015 1230   VLDL 14 12/21/2015 1230   LDLCALC 67 11/28/2023 0926   LABVLDL 13 11/28/2023 0926    Lab Results  Component Value Date   HGBA1C 5.4 11/28/2023    Assessment and plan:   Assessment & Plan Statin-induced myopathy Muscle cramps resolved after stopping atorvastatin . Tingling and numbness not related to atorvastatin . - Restart atorvastatin  20 mg twice weekly (e.g., Monday and Friday). - Monitor for muscle cramps. - If cramps recur, contact via MyChart to discuss switching to  rosuvastatin  5 mg daily. - Consider discontinuing statin if rosuvastatin  causes cramps.  Pure hypercholesterolemia Cholesterol levels normal in October. Discontinuation of atorvastatin  may increase lipid levels. Cardiovascular risk considered. - Monitor cholesterol levels in April. - Consider rosuvastatin  5 mg daily if atorvastatin  not tolerated.     No orders of the defined types were placed in this encounter.   Follow Up Instructions: Return for previously scheduled appointment.    I discussed the assessment and treatment plan with the patient. The patient was provided an opportunity to ask questions and all were answered. The patient agreed with the plan and demonstrated an understanding of the instructions.   The patient was advised to call back or seek an in-person evaluation if the symptoms worsen or if the condition fails to improve as anticipated.     I provided 12 minutes total of Telehealth time during this encounter including median intraservice time, reviewing previous notes, investigations, ordering medications, medical decision making, coordinating care and patient verbalized understanding at the end of the visit.     [name], MD, FAAFP. [facility] [place], [place] [phone]   02/18/2024, 8:27 AM     [1]  Allergies Allergen Reactions   Morphine And Codeine Nausea And Vomiting   Doxycycline  Rash  [2]  Current Outpatient Medications on File Prior to Visit  Medication Sig Dispense Refill   amLODipine  (NORVASC ) 10 MG tablet Take 1 tablet (10 mg total) by mouth daily. 90 tablet 1   atorvastatin  (LIPITOR) 20 MG tablet Take 1 tablet (20 mg total) by mouth daily. (Patient not taking: Reported on 01/31/2024) 90 tablet 1   cetirizine (ZYRTEC) 10 MG tablet Take 10 mg by mouth daily.     cholecalciferol  (VITAMIN D3) 25 MCG (1000 UNIT) tablet Take 1 tablet (1,000 Units total) by mouth every morning. 12 tablet 0    magnesium  oxide (MAG-OX) 400 MG tablet Take 1 tablet (400 mg total) by mouth daily. 90 tablet 1   metoprolol  succinate (TOPROL -XL) 100 MG 24 hr tablet Take 1 tablet (100 mg total) by mouth daily. 90 tablet 1   predniSONE  (DELTASONE ) 20 MG tablet Take 1 tablet (20 mg total) by mouth daily with breakfast. 5 tablet 0   [DISCONTINUED] mupirocin  nasal ointment (BACTROBAN ) 2 % Place 1 application into the nose 2 (two) times daily. Use one-half of tube in each nostril twice daily for five (5) days. After application, press sides of nose together and gently massage. 10 g 0   No current facility-administered medications on file prior to visit.   "

## 2024-02-19 ENCOUNTER — Other Ambulatory Visit: Payer: Self-pay | Admitting: Family Medicine

## 2024-02-19 ENCOUNTER — Encounter: Payer: Self-pay | Admitting: Family Medicine

## 2024-02-19 ENCOUNTER — Other Ambulatory Visit (HOSPITAL_COMMUNITY): Payer: Self-pay

## 2024-02-19 ENCOUNTER — Other Ambulatory Visit: Payer: Self-pay

## 2024-02-19 MED ORDER — ROSUVASTATIN CALCIUM 5 MG PO TABS
5.0000 mg | ORAL_TABLET | Freq: Every day | ORAL | 3 refills | Status: AC
  Filled 2024-02-19 (×2): qty 30, 30d supply, fill #0

## 2024-02-27 ENCOUNTER — Encounter: Payer: Self-pay | Admitting: *Deleted

## 2024-02-27 ENCOUNTER — Ambulatory Visit: Payer: Self-pay | Attending: *Deleted | Admitting: *Deleted

## 2024-02-27 VITALS — BP 125/66 | HR 64 | Ht 67.0 in | Wt 103.6 lb

## 2024-02-27 DIAGNOSIS — R918 Other nonspecific abnormal finding of lung field: Secondary | ICD-10-CM

## 2024-02-27 NOTE — Progress Notes (Signed)
 "   Patient ID: Melanie Harris, female    DOB: September 20, 1960  MRN: [id number]  CC: Hospitalization Follow-up   Subjective: Melanie Harris is a 64 y.o. female who presents for discussion concerning lung nodule found on chest x-ray during recent ED visit (02/13/2024) for precordial pain and left arm paresthesia. Precordial pain has since resolved.  Left arm paresthesia  occurred fleetingly 1 nights since her visit on 02/13/2024.  She is concerned about the lung nodule that was found on chest x-ray because of history of smoking and emphysema.  She denies symptoms of shortness of breath, wheezing, coughing, sputum production, persistent chest pain.   Pertinent past medical history include: Hypertension, SVT, emphysema, goiter, elevated lipids, abnormal Pap smear, history of skin cancer, family history of heart disease in her mother at age 54    Patient Active Problem List   Diagnosis Date Noted   Squamous cell carcinoma in situ 06/12/2021   Skin cancer of nose 01/26/2021   Goiter 12/19/2020   SVT (supraventricular tachycardia) 07/24/2017   Palpitations 05/26/2017   Xerosis of skin 02/21/2017   Low grade squamous intraepithelial lesion (LGSIL) on cervical Pap smear 07/03/2016   Pure hypercholesterolemia 12/22/2015   Anxiety state 12/21/2015   Other emphysema (HCC) 08/12/2013   Cough 08/12/2013   Smoking 08/12/2013   Essential hypertension 08/12/2013   Rash 08/21/2011   Abscess of thigh    Abscess of right thigh 07/25/2011     Medications Ordered Prior to Encounter[1]  Allergies[2]  Social History   Socioeconomic History   Marital status: Divorced    Spouse name: Not on file   Number of children: 1   Years of education: 1   Highest education level: 12th grade  Occupational History   Not on file  Tobacco Use   Smoking status: Former    Current packs/day: 0.25    Average packs/day: 0.3 packs/day for 30.0 years (7.5 ttl pk-yrs)    Types: Cigarettes    Passive  exposure: Past   Smokeless tobacco: Never   Tobacco comments:    4 cigs per day  Vaping Use   Vaping status: Never Used  Substance and Sexual Activity   Alcohol use: Yes    Alcohol/week: 1.0 - 2.0 standard drink of alcohol    Types: 1 - 2 Glasses of wine per week    Comment: occassional wine   Drug use: No   Sexual activity: Not Currently    Birth control/protection: Surgical    Comment: Partial hysterectomy, Uterous only. Pt still has her cervix and ovaries.  Other Topics Concern   Not on file  Social History Narrative   Not on file   Social Drivers of Health   Tobacco Use: Medium Risk (02/27/2024)   Patient History    Smoking Tobacco Use: Former    Smokeless Tobacco Use: Never    Passive Exposure: Past  Physicist, Medical Strain: Patient Declined (11/27/2023)   Overall Financial Resource Strain (CARDIA)    Difficulty of Paying Living Expenses: Patient declined  Food Insecurity: No Food Insecurity (01/31/2024)   Epic    Worried About Programme Researcher, Broadcasting/film/video in the Last Year: Never true    Ran Out of Food in the Last Year: Never true  Transportation Needs: No Transportation Needs (01/31/2024)   Epic    Lack of Transportation (Medical): No    Lack of Transportation (Non-Medical): No  Physical Activity: Insufficiently Active (11/27/2023)   Exercise Vital Sign    Days of Exercise per  Week: 5 days    Minutes of Exercise per Session: 10 min  Stress: No Stress Concern Present (11/27/2023)   [employer] of Occupational Health - Occupational Stress Questionnaire    Feeling of Stress: Not at all  Social Connections: Moderately Isolated (11/27/2023)   Social Connection and Isolation Panel    Frequency of Communication with Friends and Family: More than three times a week    Frequency of Social Gatherings with Friends and Family: Once a week    Attends Religious Services: More than 4 times per year    Active Member of [employer] or Organizations: No    Attends Banker  Meetings: Not on file    Marital Status: Divorced  Intimate Partner Violence: Not At Risk (01/31/2024)   Epic    Fear of Current or Ex-Partner: No    Emotionally Abused: No    Physically Abused: No    Sexually Abused: No  Depression (PHQ2-9): Low Risk (01/31/2024)   Depression (PHQ2-9)    PHQ-2 Score: 0  Alcohol Screen: Low Risk (11/27/2023)   Alcohol Screen    Last Alcohol Screening Score (AUDIT): 2  Housing: Low Risk (01/31/2024)   Epic    Unable to Pay for Housing in the Last Year: No    Number of Times Moved in the Last Year: 0    Homeless in the Last Year: No  Utilities: Not At Risk (01/31/2024)   Epic    Threatened with loss of utilities: No  Health Literacy: Adequate Health Literacy (11/28/2022)   B1300 Health Literacy    Frequency of need for help with medical instructions: Never    Family History  Problem Relation Age of Onset   Heart failure Mother        started in her 45's   Hypertension Mother    Hyperlipidemia Mother    Lung cancer Father    Hypertension Father    Hypercholesterolemia Father    Diabetes Sister        diet controlled   Dysrhythmia Sister        possible in the past   Healthy Daughter    Breast cancer Neg Hx     Past Surgical History:  Procedure Laterality Date   ABDOMINAL HYSTERECTOMY  2004   COLPOSCOPY  12/12/2021   COLPOSCOPY  01/29/2023   in GYN office procedure   NASAL FLAP ROTATION Left 01/27/2021   Procedure: NASAL FLAP ROTATION;  Surgeon: Elisabeth Craig RAMAN, MD;  Location: [facility] OR;  Service: Plastics;  Laterality: Left;   NASAL FLAP ROTATION Left 10/10/2021   Procedure: Revision left nasal forehead flap with complex closure, possible cartilage graft to left nose;  Surgeon: Marene Sieving, MD;  Location:  [facility];  Service: Plastics;  Laterality: Left;   NASAL FLAP ROTATION Left 02/27/2023   Procedure: DEBULKING OF LEFT NASAL FLAP;  Surgeon: Luciano Standing, MD;  Location: [facility] OR;  Service: ENT;  Laterality: Left;    NASAL RECONSTRUCTION Left 01/27/2021   Procedure: NASAL RECONSTRUCTION;  Surgeon: Elisabeth Craig RAMAN, MD;  Location: [facility] OR;  Service: Plastics;  Laterality: Left;   NASAL RECONSTRUCTION Bilateral 02/21/2021   Procedure: DIVISION AND INSET OF NASAL FLAPS, DEBRIDEMENT RIGHT NASOLABIAL FLAP, REVISION OF INSET RIGHT NASOLABIAL FLAP;  Surgeon: Elisabeth Craig RAMAN, MD;  Location: [facility] OR;  Service: Plastics;  Laterality: Bilateral;   NASAL RECONSTRUCTION Right 03/13/2021   Procedure: Division and Inset forehead flap, with nasal reconstruction with flaps and grafts as necessary, left rib cartilage  graft.;  Surgeon: Elisabeth Craig RAMAN, MD;  Location: [facility] OR;  Service: Plastics;  Laterality: Right;  1.5 hour   RHINOPLASTY Left 01/18/2021   Procedure: PARTIAL RHINECTOMY WITH FROZEN SECTION;  Surgeon: Jesus Oliphant, MD;  Location: [facility] OR;  Service: ENT;  Laterality: Left;   SKIN FULL THICKNESS GRAFT Left 01/27/2021   Procedure: SKIN GRAFT FULL THICKNESS;  Surgeon: Elisabeth Craig RAMAN, MD;  Location: [facility] OR;  Service: Plastics;  Laterality: Left;   TONSILLECTOMY  10-11 yrs old    ROS: Review of Systems Negative except as stated above  PHYSICAL EXAM: BP 125/66 (BP Location: Left Arm, Patient Position: Sitting, Cuff Size: Normal)   Pulse 64   Ht 5' 7 (1.702 m)   Wt 47 kg   SpO2 99%   BMI 16.23 kg/m   Physical Exam Vitals and nursing note reviewed.  Constitutional:      Appearance: Normal appearance.  Cardiovascular:     Rate and Rhythm: Normal rate and regular rhythm.  Pulmonary:     Effort: Pulmonary effort is normal.     Breath sounds: Normal breath sounds.  Musculoskeletal:     Cervical back: Normal range of motion and neck supple.  Skin:    General: Skin is warm and dry.  Neurological:     Mental Status: Mental status is at baseline.     Gait: Gait normal.     Comments: Normal cranial nerves II through XII good reflexes, sensation and motor function in upper extremities          Latest Ref Rng &  Units 02/13/2024   11:18 AM 01/31/2024    3:48 PM 11/28/2023    9:26 AM  CMP  Glucose 70 - 99 mg/dL 862  899  894   BUN 8 - 23 mg/dL 13  15  12    Creatinine 0.44 - 1.00 mg/dL 9.33  9.31  9.27   Sodium 135 - 145 mmol/L 135  140  138   Potassium 3.5 - 5.1 mmol/L 3.9  4.5  4.2   Chloride 98 - 111 mmol/L 97  101  101   CO2 22 - 32 mmol/L 21  23  23    Calcium  8.9 - 10.3 mg/dL 89.6  89.8  9.8   Total Protein 6.0 - 8.5 g/dL  7.3  7.2   Total Bilirubin 0.0 - 1.2 mg/dL  0.4  0.4   Alkaline Phos 49 - 135 IU/L  99  101   AST 0 - 40 IU/L  28  32   ALT 0 - 32 IU/L  31  28    Lipid Panel     Component Value Date/Time   CHOL 188 11/28/2023 0926   TRIG 74 11/28/2023 0926   HDL 108 11/28/2023 0926   CHOLHDL 1.8 06/09/2020 1625   CHOLHDL 1.7 12/21/2015 1230   VLDL 14 12/21/2015 1230   LDLCALC 67 11/28/2023 0926    CBC    Component Value Date/Time   WBC 9.0 02/13/2024 1118   RBC 4.52 02/13/2024 1118   HGB 14.4 02/13/2024 1118   HGB 14.2 01/31/2024 1548   HCT 41.7 02/13/2024 1118   HCT 43.9 01/31/2024 1548   PLT 283 02/13/2024 1118   PLT 300 01/31/2024 1548   MCV 92.3 02/13/2024 1118   MCV 99 (H) 01/31/2024 1548   MCH 31.9 02/13/2024 1118   MCHC 34.5 02/13/2024 1118   RDW 13.3 02/13/2024 1118   RDW 13.2 01/31/2024 1548   LYMPHSABS 1.4 11/28/2023 0926  MONOABS 0.7 10/30/2016 1222   EOSABS 0.1 11/28/2023 0926   BASOSABS 0.1 11/28/2023 0926    Results for orders placed or performed during the hospital encounter of 02/13/24  Basic metabolic panel   Collection Time: 02/13/24 11:18 AM  Result Value Ref Range   Sodium 135 135 - 145 mmol/L   Potassium 3.9 3.5 - 5.1 mmol/L   Chloride 97 (L) 98 - 111 mmol/L   CO2 21 (L) 22 - 32 mmol/L   Glucose, Bld 137 (H) 70 - 99 mg/dL   BUN 13 8 - 23 mg/dL   Creatinine, Ser 9.33 0.44 - 1.00 mg/dL   Calcium  10.3 8.9 - 10.3 mg/dL   GFR, Estimated >39 >39 mL/min   Anion gap 17 (H) 5 - 15  CBC   Collection Time: 02/13/24 11:18 AM  Result Value  Ref Range   WBC 9.0 4.0 - 10.5 K/uL   RBC 4.52 3.87 - 5.11 MIL/uL   Hemoglobin 14.4 12.0 - 15.0 g/dL   HCT 58.2 63.9 - 53.9 %   MCV 92.3 80.0 - 100.0 fL   MCH 31.9 26.0 - 34.0 pg   MCHC 34.5 30.0 - 36.0 g/dL   RDW 86.6 88.4 - 84.4 %   Platelets 283 150 - 400 K/uL   nRBC 0.0 0.0 - 0.2 %  Troponin T, High Sensitivity   Collection Time: 02/13/24 11:18 AM  Result Value Ref Range   Troponin T High Sensitivity <15 0 - 19 ng/L  Troponin T, High Sensitivity   Collection Time: 02/13/24  1:52 PM  Result Value Ref Range   Troponin T High Sensitivity <15 0 - 19 ng/L     ASSESSMENT AND PLAN:  Assessment & Plan Abnormal findings on diagnostic imaging of lung As above she was seen for evaluation of precordial pain and left arm paresthesia. The symptoms have resolved. Evaluation included a chest x-ray that showed a possible lung nodule. Will proceed with a chest CT with contrast for further evaluation Orders:   CT Chest W Contrast       Patient was given the opportunity to ask questions.  Patient verbalized understanding of the plan and was able to repeat key elements of the plan.   This documentation was completed using Paediatric nurse.  Any transcriptional errors are unintentional.     Requested Prescriptions    No prescriptions requested or ordered in this encounter    Return in about 3 months (around 05/27/2024) for with PCP.  Maysa Lynn H, NP      [1]  Current Outpatient Medications on File Prior to Visit  Medication Sig Dispense Refill   amLODipine  (NORVASC ) 10 MG tablet Take 1 tablet (10 mg total) by mouth daily. 90 tablet 1   cetirizine (ZYRTEC) 10 MG tablet Take 10 mg by mouth daily.     cholecalciferol  (VITAMIN D3) 25 MCG (1000 UNIT) tablet Take 1 tablet (1,000 Units total) by mouth every morning. 12 tablet 0   magnesium  oxide (MAG-OX) 400 MG tablet Take 1 tablet (400 mg total) by mouth daily. 90 tablet 1   metoprolol  succinate (TOPROL -XL)  100 MG 24 hr tablet Take 1 tablet (100 mg total) by mouth daily. 90 tablet 1   predniSONE  (DELTASONE ) 20 MG tablet Take 1 tablet (20 mg total) by mouth daily with breakfast. 5 tablet 0   rosuvastatin  (CRESTOR ) 5 MG tablet Take 1 tablet (5 mg total) by mouth daily. 30 tablet 3   [DISCONTINUED] mupirocin  nasal ointment (BACTROBAN ) 2 %  Place 1 application into the nose 2 (two) times daily. Use one-half of tube in each nostril twice daily for five (5) days. After application, press sides of nose together and gently massage. 10 g 0   No current facility-administered medications on file prior to visit.  [2]  Allergies Allergen Reactions   Morphine And Codeine Nausea And Vomiting   Doxycycline  Rash   "

## 2024-02-27 NOTE — Patient Instructions (Signed)
 Today we talked about your recent urgent visit for evaluation of precordial chest pain and left arm paresthesia. you reported that the chest pain is gone and the left arm paresthesia has improved. 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 You will continue to follow that. They did find a lung nodule during your evaluation for precordial chest pain. Will proceed with a CT scan for further evaluation. Let us  know if your symptoms persist or worsen. Keep your appointment with your PCP for further discussion

## 2024-03-05 ENCOUNTER — Ambulatory Visit (HOSPITAL_BASED_OUTPATIENT_CLINIC_OR_DEPARTMENT_OTHER): Payer: Self-pay | Admitting: Cardiology

## 2024-03-19 ENCOUNTER — Ambulatory Visit (HOSPITAL_BASED_OUTPATIENT_CLINIC_OR_DEPARTMENT_OTHER): Payer: Self-pay | Admitting: Cardiology

## 2024-04-07 ENCOUNTER — Ambulatory Visit: Payer: Self-pay

## 2024-04-09 ENCOUNTER — Ambulatory Visit

## 2024-05-27 ENCOUNTER — Ambulatory Visit: Payer: Self-pay | Admitting: Family Medicine

## 2024-05-28 ENCOUNTER — Ambulatory Visit: Admitting: Family Medicine
